# Patient Record
Sex: Female | Born: 1945
Health system: Southern US, Community
[De-identification: ages and names within clinical notes are randomized; demographics above are authoritative.]

## PROBLEM LIST (undated history)

## (undated) DIAGNOSIS — I890 Lymphedema, not elsewhere classified: Secondary | ICD-10-CM

## (undated) DIAGNOSIS — J449 Chronic obstructive pulmonary disease, unspecified: Secondary | ICD-10-CM

## (undated) DIAGNOSIS — E785 Hyperlipidemia, unspecified: Secondary | ICD-10-CM

## (undated) DIAGNOSIS — K219 Gastro-esophageal reflux disease without esophagitis: Secondary | ICD-10-CM

## (undated) DIAGNOSIS — F32A Depression, unspecified: Secondary | ICD-10-CM

## (undated) DIAGNOSIS — R112 Nausea with vomiting, unspecified: Secondary | ICD-10-CM

## (undated) DIAGNOSIS — I480 Paroxysmal atrial fibrillation: Secondary | ICD-10-CM

## (undated) DIAGNOSIS — J4489 Other specified chronic obstructive pulmonary disease: Secondary | ICD-10-CM

## (undated) DIAGNOSIS — O039 Complete or unspecified spontaneous abortion without complication: Secondary | ICD-10-CM

## (undated) DIAGNOSIS — M1990 Unspecified osteoarthritis, unspecified site: Secondary | ICD-10-CM

## (undated) DIAGNOSIS — F419 Anxiety disorder, unspecified: Secondary | ICD-10-CM

## (undated) DIAGNOSIS — I5189 Other ill-defined heart diseases: Secondary | ICD-10-CM

## (undated) DIAGNOSIS — F329 Major depressive disorder, single episode, unspecified: Secondary | ICD-10-CM

## (undated) DIAGNOSIS — Z86010 Personal history of colonic polyps: Secondary | ICD-10-CM

## (undated) DIAGNOSIS — J4540 Moderate persistent asthma, uncomplicated: Secondary | ICD-10-CM

## (undated) DIAGNOSIS — I10 Essential (primary) hypertension: Secondary | ICD-10-CM

## (undated) DIAGNOSIS — Z780 Asymptomatic menopausal state: Secondary | ICD-10-CM

## (undated) DIAGNOSIS — G43909 Migraine, unspecified, not intractable, without status migrainosus: Secondary | ICD-10-CM

## (undated) DIAGNOSIS — H269 Unspecified cataract: Secondary | ICD-10-CM

## (undated) DIAGNOSIS — T7840XA Allergy, unspecified, initial encounter: Secondary | ICD-10-CM

## (undated) DIAGNOSIS — Z9889 Other specified postprocedural states: Secondary | ICD-10-CM

## (undated) HISTORY — DX: Other specified chronic obstructive pulmonary disease: J44.89

## (undated) HISTORY — PX: CATARACT EXTRACTION, BILATERAL: SHX1313

## (undated) HISTORY — DX: Paroxysmal atrial fibrillation: I48.0

## (undated) HISTORY — DX: Complete or unspecified spontaneous abortion without complication: O03.9

## (undated) HISTORY — DX: Hyperlipidemia, unspecified: E78.5

## (undated) HISTORY — PX: POLYPECTOMY: SHX149

## (undated) HISTORY — DX: Unspecified cataract: H26.9

## (undated) HISTORY — DX: Essential (primary) hypertension: I10

## (undated) HISTORY — DX: Other specified postprocedural states: Z98.890

## (undated) HISTORY — DX: Depression, unspecified: F32.A

## (undated) HISTORY — DX: Migraine, unspecified, not intractable, without status migrainosus: G43.909

## (undated) HISTORY — DX: Other ill-defined heart diseases: I51.89

## (undated) HISTORY — DX: Gastro-esophageal reflux disease without esophagitis: K21.9

## (undated) HISTORY — DX: Anxiety disorder, unspecified: F41.9

## (undated) HISTORY — DX: Lymphedema, not elsewhere classified: I89.0

## (undated) HISTORY — DX: Moderate persistent asthma, uncomplicated: J45.40

## (undated) HISTORY — DX: Major depressive disorder, single episode, unspecified: F32.9

## (undated) HISTORY — PX: COLONOSCOPY: SHX174

## (undated) HISTORY — DX: Allergy, unspecified, initial encounter: T78.40XA

## (undated) HISTORY — DX: Nausea with vomiting, unspecified: R11.2

## (undated) HISTORY — DX: Chronic obstructive pulmonary disease, unspecified: J44.9

## (undated) HISTORY — PX: EYE SURGERY: SHX253

## (undated) HISTORY — DX: Unspecified osteoarthritis, unspecified site: M19.90

## (undated) HISTORY — DX: Asymptomatic menopausal state: Z78.0

## (undated) HISTORY — DX: Personal history of colonic polyps: Z86.010

---

## 1978-05-13 HISTORY — PX: TUBAL LIGATION: SHX77

## 2002-07-19 ENCOUNTER — Other Ambulatory Visit: Admission: RE | Admit: 2002-07-19 | Discharge: 2002-07-19 | Payer: Self-pay | Admitting: Family Medicine

## 2004-04-12 ENCOUNTER — Encounter: Payer: Self-pay | Admitting: Family Medicine

## 2004-08-29 ENCOUNTER — Ambulatory Visit: Payer: Self-pay | Admitting: Family Medicine

## 2004-09-06 ENCOUNTER — Ambulatory Visit: Payer: Self-pay | Admitting: Family Medicine

## 2004-09-17 ENCOUNTER — Ambulatory Visit: Payer: Self-pay | Admitting: Internal Medicine

## 2004-10-01 ENCOUNTER — Encounter (INDEPENDENT_AMBULATORY_CARE_PROVIDER_SITE_OTHER): Payer: Self-pay | Admitting: Specialist

## 2004-10-01 ENCOUNTER — Ambulatory Visit: Payer: Self-pay | Admitting: Internal Medicine

## 2004-10-11 ENCOUNTER — Ambulatory Visit: Payer: Self-pay | Admitting: Family Medicine

## 2005-04-23 ENCOUNTER — Ambulatory Visit: Payer: Self-pay | Admitting: Family Medicine

## 2005-04-24 ENCOUNTER — Ambulatory Visit: Payer: Self-pay | Admitting: Family Medicine

## 2005-05-13 LAB — HM COLONOSCOPY: HM Colonoscopy: NORMAL

## 2005-08-13 LAB — HM COLONOSCOPY

## 2005-10-30 ENCOUNTER — Ambulatory Visit: Payer: Self-pay | Admitting: Family Medicine

## 2006-04-23 ENCOUNTER — Ambulatory Visit: Payer: Self-pay | Admitting: Family Medicine

## 2006-04-30 ENCOUNTER — Ambulatory Visit: Payer: Self-pay | Admitting: Family Medicine

## 2007-03-10 ENCOUNTER — Telehealth: Payer: Self-pay | Admitting: Family Medicine

## 2007-05-14 LAB — HM DEXA SCAN

## 2007-05-14 LAB — HM MAMMOGRAPHY

## 2007-06-22 ENCOUNTER — Telehealth: Payer: Self-pay | Admitting: Family Medicine

## 2007-07-08 ENCOUNTER — Encounter (INDEPENDENT_AMBULATORY_CARE_PROVIDER_SITE_OTHER): Payer: Self-pay | Admitting: *Deleted

## 2007-08-18 ENCOUNTER — Encounter: Payer: Self-pay | Admitting: Family Medicine

## 2007-08-18 DIAGNOSIS — K589 Irritable bowel syndrome without diarrhea: Secondary | ICD-10-CM

## 2007-08-18 DIAGNOSIS — K219 Gastro-esophageal reflux disease without esophagitis: Secondary | ICD-10-CM

## 2007-08-18 DIAGNOSIS — I10 Essential (primary) hypertension: Secondary | ICD-10-CM | POA: Insufficient documentation

## 2007-08-18 DIAGNOSIS — J45909 Unspecified asthma, uncomplicated: Secondary | ICD-10-CM

## 2007-08-18 DIAGNOSIS — J309 Allergic rhinitis, unspecified: Secondary | ICD-10-CM | POA: Insufficient documentation

## 2007-08-18 DIAGNOSIS — Z87898 Personal history of other specified conditions: Secondary | ICD-10-CM | POA: Insufficient documentation

## 2007-08-18 DIAGNOSIS — L408 Other psoriasis: Secondary | ICD-10-CM

## 2007-08-18 DIAGNOSIS — M1990 Unspecified osteoarthritis, unspecified site: Secondary | ICD-10-CM

## 2007-08-18 DIAGNOSIS — E785 Hyperlipidemia, unspecified: Secondary | ICD-10-CM

## 2007-08-18 DIAGNOSIS — E1169 Type 2 diabetes mellitus with other specified complication: Secondary | ICD-10-CM | POA: Insufficient documentation

## 2007-08-19 ENCOUNTER — Ambulatory Visit: Payer: Self-pay | Admitting: Family Medicine

## 2007-08-19 DIAGNOSIS — N951 Menopausal and female climacteric states: Secondary | ICD-10-CM | POA: Insufficient documentation

## 2007-08-19 DIAGNOSIS — E559 Vitamin D deficiency, unspecified: Secondary | ICD-10-CM

## 2007-08-25 LAB — CONVERTED CEMR LAB
AST: 20 units/L (ref 0–37)
Albumin: 3.8 g/dL (ref 3.5–5.2)
Alkaline Phosphatase: 88 units/L (ref 39–117)
BUN: 13 mg/dL (ref 6–23)
Bilirubin, Direct: 0.1 mg/dL (ref 0.0–0.3)
Chloride: 106 meq/L (ref 96–112)
Eosinophils Absolute: 0.4 10*3/uL (ref 0.0–0.7)
Eosinophils Relative: 5.9 % — ABNORMAL HIGH (ref 0.0–5.0)
GFR calc non Af Amer: 54 mL/min
HDL: 37.1 mg/dL — ABNORMAL LOW (ref >39.0)
Monocytes Relative: 7.6 % (ref 3.0–12.0)
Neutrophils Relative %: 71.1 % (ref 43.0–77.0)
Platelets: 237 10*3/uL (ref 150–400)
Potassium: 4.2 meq/L (ref 3.5–5.1)
Total CHOL/HDL Ratio: 4.4
VLDL: 36 mg/dL (ref 0–40)
WBC: 7.2 10*3/uL (ref 4.5–10.5)

## 2007-09-16 ENCOUNTER — Encounter: Payer: Self-pay | Admitting: Internal Medicine

## 2007-09-16 ENCOUNTER — Ambulatory Visit: Payer: Self-pay | Admitting: Internal Medicine

## 2007-10-14 ENCOUNTER — Encounter: Payer: Self-pay | Admitting: Family Medicine

## 2007-10-19 ENCOUNTER — Encounter (INDEPENDENT_AMBULATORY_CARE_PROVIDER_SITE_OTHER): Payer: Self-pay | Admitting: *Deleted

## 2007-10-26 ENCOUNTER — Encounter (INDEPENDENT_AMBULATORY_CARE_PROVIDER_SITE_OTHER): Payer: Self-pay | Admitting: *Deleted

## 2008-01-04 ENCOUNTER — Telehealth (INDEPENDENT_AMBULATORY_CARE_PROVIDER_SITE_OTHER): Payer: Self-pay | Admitting: *Deleted

## 2008-01-19 ENCOUNTER — Telehealth (INDEPENDENT_AMBULATORY_CARE_PROVIDER_SITE_OTHER): Payer: Self-pay | Admitting: *Deleted

## 2008-07-28 ENCOUNTER — Telehealth: Payer: Self-pay | Admitting: Family Medicine

## 2009-03-29 ENCOUNTER — Ambulatory Visit: Payer: Self-pay | Admitting: Internal Medicine

## 2009-10-04 ENCOUNTER — Encounter (INDEPENDENT_AMBULATORY_CARE_PROVIDER_SITE_OTHER): Payer: Self-pay | Admitting: *Deleted

## 2010-04-12 LAB — BASIC METABOLIC PANEL
BUN: 15 mg/dL (ref 4–21)
Sodium: 141 mmol/L (ref 137–147)

## 2010-04-12 LAB — HEPATIC FUNCTION PANEL: AST: 21 U/L (ref 13–35)

## 2010-04-12 LAB — LIPID PANEL
Cholesterol: 169 mg/dL (ref 0–200)
HDL: 44 mg/dL (ref 35–70)
LDL Cholesterol: 93 mg/dL
Triglycerides: 158 mg/dL (ref 40–160)

## 2010-04-18 ENCOUNTER — Encounter: Payer: Self-pay | Admitting: Gastroenterology

## 2010-05-11 ENCOUNTER — Encounter (INDEPENDENT_AMBULATORY_CARE_PROVIDER_SITE_OTHER): Payer: Self-pay | Admitting: *Deleted

## 2010-05-15 LAB — HM COLONOSCOPY: HM Colonoscopy: 5

## 2010-05-16 ENCOUNTER — Ambulatory Visit
Admission: RE | Admit: 2010-05-16 | Discharge: 2010-05-16 | Payer: Self-pay | Source: Home / Self Care | Attending: Internal Medicine | Admitting: Internal Medicine

## 2010-05-30 ENCOUNTER — Ambulatory Visit
Admission: RE | Admit: 2010-05-30 | Discharge: 2010-05-30 | Payer: Self-pay | Source: Home / Self Care | Attending: Internal Medicine | Admitting: Internal Medicine

## 2010-05-30 ENCOUNTER — Other Ambulatory Visit: Payer: Self-pay | Admitting: Internal Medicine

## 2010-05-30 DIAGNOSIS — Z86010 Personal history of colonic polyps: Secondary | ICD-10-CM

## 2010-05-30 DIAGNOSIS — Z860101 Personal history of adenomatous and serrated colon polyps: Secondary | ICD-10-CM

## 2010-05-30 HISTORY — DX: Personal history of adenomatous and serrated colon polyps: Z86.0101

## 2010-05-30 HISTORY — DX: Personal history of colonic polyps: Z86.010

## 2010-06-05 ENCOUNTER — Encounter: Payer: Self-pay | Admitting: Internal Medicine

## 2010-06-12 NOTE — Letter (Signed)
Summary: Pre Visit Letter Revised  [facility]  [address] [place], [place] [address]   Phone: [phone]  Fax: [phone]        04/18/2010 MRN: [id number]  Kari [name] [address] [place], [place]  [address]             Procedure Date:  05-30-2010 1:30pm           Dr Leone Payor - Recall Colon   Welcome to the Gastroenterology Division at [facility].    You are scheduled to see a nurse for your pre-procedure visit on 05-16-2010 at 4pm on the 3rd floor at [facility], [address]  We ask that you try to arrive at our office 15 minutes prior to your appointment time to allow for check-in.  Please take a minute to review the attached form.  If you answer "Yes" to one or more of the questions on the first page, we ask that you call the person listed at your earliest opportunity.  If you answer "No" to all of the questions, please complete the rest of the form and bring it to your appointment.    Your nurse visit will consist of discussing your medical and surgical history, your immediate family medical history, and your medications.   If you are unable to list all of your medications on the form, please bring the medication bottles to your appointment and we will list them.  We will need to be aware of both prescribed and over the counter drugs.  We will need to know exact dosage information as well.    Please be prepared to read and sign documents such as consent forms, a financial agreement, and acknowledgement forms.  If necessary, and with your consent, a friend or relative is welcome to sit-in on the nurse visit with you.  Please bring your insurance card so that we may make a copy of it.  If your insurance requires a referral to see a specialist, please bring your referral form from your primary care physician.  No co-pay is required for this nurse visit.     If you cannot keep your appointment, please call [phone] to cancel or  reschedule prior to your appointment date.  This allows [place] the opportunity to schedule an appointment for another patient in need of care.    Thank you for choosing [facility] for your medical needs.  We appreciate the opportunity to care for you.  Please visit [place] at our website  to learn more about our practice.  Sincerely, The Gastroenterology Division

## 2010-06-12 NOTE — Letter (Signed)
Summary: Colonoscopy Letter  [facility]  [address] [place], [place] [address]   Phone: [phone]  Fax: [phone]      Oct 04, 2009 MRN: [id number]   [facility] [address] [place], [place]  [address]   Dear Ms. Dantes,   According to your medical record, it is time for you to schedule a Colonoscopy. The American Cancer Society recommends this procedure as a method to detect early colon cancer. Patients with a family history of colon cancer, or a personal history of colon polyps or inflammatory bowel disease are at increased risk.  This letter has beeen generated based on the recommendations made at the time of your procedure. If you feel that in your particular situation this may no longer apply, please contact our office.  Please call our office at [phone] to schedule this appointment or to update your records at your earliest convenience.  Thank you for cooperating with [place] to provide you with the very best care possible.   Sincerely,  Iva Boop, M.D.  [facility] Gastroenterology Division [phone]

## 2010-06-14 NOTE — Miscellaneous (Signed)
Summary: [facility] Pervisit/prep  Clinical Lists Changes  Medications: Added new medication of MOVIPREP 100 GM  SOLR (PEG-KCL-NACL-NASULF-NA ASC-C) As per prep instructions. - Signed Rx of MOVIPREP 100 GM  SOLR (PEG-KCL-NACL-NASULF-NA ASC-C) As per prep instructions.;  #1 x 0;  Signed;  Entered by: Wyona Almas RN;  Authorized by: Iva Boop MD, [facility];  Method used: Electronically to [facility] [address] [place], [place], [place]  [address], Ph: [phone], Fax: [phone] Allergies: Changed allergy or adverse reaction from * PROCARDIA XL to * PROCARDIA XL Observations: Added new observation of ALLERGY REV: Done (05/16/2010 15:56)    Prescriptions: MOVIPREP 100 GM  SOLR (PEG-KCL-NACL-NASULF-NA ASC-C) As per prep instructions.  #1 x 0   Entered by:   Wyona Almas RN   Authorized by:   Iva Boop MD, [facility]   Signed by:   Wyona Almas RN on 05/16/2010   Method used:   Electronically to        [facility]       [address]       [place], [place]  [address]       Ph: [phone]       Fax: [phone]   RxID:   [id number]

## 2010-06-14 NOTE — Letter (Signed)
Summary: [facility] Instructions  [facility]  [address] [place], [place] [address]   Phone: [phone]  Fax: [phone]       Kari [name]    Aug 08, 1945    MRN: [id number]        Procedure Day Dorna Bloom:  Wednesday  05-30-10     Arrival Time: 12:30 pm     Procedure Time: 1:30 pm     Location of Procedure:                    _x _  [facility] (4th Floor)  PREPARATION FOR COLONOSCOPY WITH MOVIPREP   Starting 5 days prior to your procedure  05-25-10  do not eat nuts, seeds, popcorn, corn, beans, peas,  salads, or any raw vegetables.  Do not take any fiber supplements (e.g. Metamucil, Citrucel, and Benefiber).  THE DAY BEFORE YOUR PROCEDURE         DATE:  05-29-10  DAY:  Tuesday   1.  Drink clear liquids the entire day-NO SOLID FOOD  2.  Do not drink anything colored red or purple.  Avoid juices with pulp.  No orange juice.  3.  Drink at least 64 oz. (8 glasses) of fluid/clear liquids during the day to prevent dehydration and help the prep work efficiently.  CLEAR LIQUIDS INCLUDE: Water Jello Ice Popsicles Tea (sugar ok, no milk/cream) Powdered fruit flavored drinks Coffee (sugar ok, no milk/cream) Gatorade Juice: apple, white grape, white cranberry  Lemonade Clear bullion, consomm, broth Carbonated beverages (any kind) Strained chicken noodle soup Hard Candy                             4.  In the morning, mix first dose of MoviPrep solution:    Empty 1 Pouch A and 1 Pouch B into the disposable container    Add lukewarm drinking water to the top line of the container. Mix to dissolve    Refrigerate (mixed solution should be used within 24 hrs)  5.  Begin drinking the prep at 5:00 p.m. The MoviPrep container is divided by 4 marks.   Every 15 minutes drink the solution down to the next mark (approximately 8 oz) until the full liter is complete.   6.  Follow completed prep with 16 oz of clear liquid of your choice (Nothing red or  purple).  Continue to drink clear liquids until bedtime.  7.  Before going to bed, mix second dose of MoviPrep solution:    Empty 1 Pouch A and 1 Pouch B into the disposable container    Add lukewarm drinking water to the top line of the container. Mix to dissolve    Refrigerate  THE DAY OF YOUR PROCEDURE      DATE:  05-30-10  DAY:  Wednesday  Beginning at  8:30 a.m. (5 hours before procedure):         1. Every 15 minutes, drink the solution down to the next mark (approx 8 oz) until the full liter is complete.  2. Follow completed prep with 16 oz. of clear liquid of your choice.    3. You may drink clear liquids until  11:30 a.m. (2 HOURS BEFORE PROCEDURE).   MEDICATION INSTRUCTIONS  Unless otherwise instructed, you should take regular prescription medications with a small sip of water   as early as possible the morning of your procedure.  OTHER INSTRUCTIONS  You will need a responsible adult at least 63 years of age to accompany you and drive you home.   This person must remain in the waiting room during your procedure.  Wear loose fitting clothing that is easily removed.  Leave jewelry and other valuables at home.  However, you may wish to bring a book to read or  an iPod/MP3 player to listen to music as you wait for your procedure to start.  Remove all body piercing jewelry and leave at home.  Total time from sign-in until discharge is approximately 2-3 hours.  You should go home directly after your procedure and rest.  You can resume normal activities the  day after your procedure.  The day of your procedure you should not:   Drive   Make legal decisions   Operate machinery   Drink alcohol   Return to work  You will receive specific instructions about eating, activities and medications before you leave.    The above instructions have been reviewed and explained to me by   Wyona Almas RN  May 16, 2010 4:31 PM     I fully understand  and can verbalize these instructions _____________________________ Date _________

## 2010-06-14 NOTE — Letter (Signed)
Summary: Patient Notice- Polyp Results  [facility]  [address] [place], [place] [address]   Phone: [phone]  Fax: [phone]        June 05, 2010 MRN: [id number]    [facility] [address] [place], [place]  [address]    Dear Ms. Deshler,  Some of the polyps removed from your colon were adenomatous. This means that they were pre-cancerous or that  they had the potential to change into cancer over time.   I recommend that you have a repeat colonoscopy in 3 years to determine if you have developed any new polyps over time and screen for colorectal cancer. If you develop any new rectal bleeding, abdominal pain or significant bowel habit changes, please contact us before then.  As you know, your sister has had colon cancer and may have a specific genetic abnormality. If this is true, you could need to have a colonoscopy at a sooner interval. You could also benefit from this knowledge and it could be used to determine if you have the same problem with colon cancer.  As we discussed before your colonoscopy, I recommend you obtain your sister's genetic testing information and diagnosis information and let me review it to better guide your care.  Please call us if you are having persistent problems or have questions about your condition that have not been fully answered at this time.  Sincerely,  Iva Boop MD, [facility]  This letter has been electronically signed by your physician.  Appended Document: Patient Notice- Polyp Results LETTER MAILED

## 2010-06-14 NOTE — Procedures (Addendum)
Summary: Colonoscopy  [name]: Kari [name] [name] Note: All result statuses are Final unless otherwise noted.  Tests: (1) Colonoscopy (COL)   COL Colonoscopy           DONE     [facility]     [address]     [place], [place]  [address]           COLONOSCOPY PROCEDURE REPORT           [name]:  Kari [name], [name]  MR#:  [id number]     BIRTHDATE:  06-24-1945, 64 yrs. old  GENDER:  female     ENDOSCOPIST:  Iva Boop, MD, [facility]           PROCEDURE DATE:  05/30/2010     PROCEDURE:  Colonoscopy with snare polypectomy     ASA CLASS:  Class II     INDICATIONS:  Elevated Risk Screening, family history of colon     cancer sister diagnosed at 75     she may have a genetic syndrome diagnosed, not certain     MEDICATIONS:   Fentanyl 50 mcg, Versed 5 mg IV           DESCRIPTION OF PROCEDURE:   After Kari [name] risks benefits and     alternatives of Kari [name] procedure were thoroughly explained, informed     consent was obtained.  Digital rectal exam was performed and     revealed no abnormalities.   Kari [name] LB 180AL [id number] endoscope was     introduced through Kari [name] anus and advanced to Kari [name] cecum, which was     identified by both Kari [name] appendix and ileocecal valve, without     limitations.  Kari [name] quality of Kari [name] prep was excellent, using     MoviPrep.  Kari [name] instrument was then slowly withdrawn as Kari [name] colon     was fully examined. Insertion: 2:52 minutes Withdrawal: 21:09     minutes     <<PROCEDUREIMAGES>>           FINDINGS:  Five polyps were found. Cecum (6mm), diminutive polyps     in transverse (2), splenic flexure and descending colon. Polyp was     snared, then cauterized with monopolar cautery. Retrieval was     successful. Polyps were snared without cautery. Retrieval was     successful. Mild diverticulosis was found in Kari [name] sigmoid colon.     This was otherwise a normal examination of Kari [name] colon including     right colon retroflexion.  Retroflexed views in Kari [name] rectum     revealed internal  hemorrhoids.    Kari [name] scope was then withdrawn     from Kari [name] [name] and Kari [name] procedure completed.           COMPLICATIONS:  None     ENDOSCOPIC IMPRESSION:     1) Five polyps removed one 6 mm and Kari [name] rest < 5mm.     2) Mild diverticulosis in Kari [name] sigmoid colon     3) Lipoma, descending colon     4) Otherwise normal examination of Kari [name] colon, excellent prep     5) Internal hemorrhoids     RECOMMENDATIONS:     1) Hold aspirin, aspirin products, and anti-inflammatory     medication for 1 week.           Consider deep sedation next exam.           REPEAT EXAM:  In for Colonoscopy, pending biopsy results.  Iva Boop, MD, [facility]           CC:  [name], MD     Kari [name] [name]           n.     Rosalie Doctor:   Iva Boop at 05/30/2010 02:16 PM           Milinda Pointer, [id number]  Note: An exclamation mark (!) indicates a result that was not dispersed into Kari [name] flowsheet. Document Creation Date: 05/30/2010 2:16 PM _______________________________________________________________________  (1) Order result status: Final Collection or observation date-time: 05/30/2010 14:02 Requested date-time:  Receipt date-time:  Reported date-time:  Referring Physician:   Ordering Physician: Stan Head [phone]) Specimen Source:  Source: Launa Grill Order Number: [id number] Lab site:   Appended Document: Colonoscopy she will again ask her sister for genetic testing information so I can better recommend colonoscopy intervals and possibly recommend blood testing for inherited cancer syndromes  Appended Document: Colonoscopy     Procedures Next Due Date:    Colonoscopy: 05/2013

## 2011-01-02 ENCOUNTER — Ambulatory Visit (INDEPENDENT_AMBULATORY_CARE_PROVIDER_SITE_OTHER): Payer: 59 | Admitting: Internal Medicine

## 2011-01-02 ENCOUNTER — Encounter: Payer: Self-pay | Admitting: Internal Medicine

## 2011-01-02 DIAGNOSIS — E785 Hyperlipidemia, unspecified: Secondary | ICD-10-CM

## 2011-01-02 DIAGNOSIS — E559 Vitamin D deficiency, unspecified: Secondary | ICD-10-CM

## 2011-01-02 DIAGNOSIS — K589 Irritable bowel syndrome without diarrhea: Secondary | ICD-10-CM

## 2011-01-02 DIAGNOSIS — Z1239 Encounter for other screening for malignant neoplasm of breast: Secondary | ICD-10-CM

## 2011-01-02 DIAGNOSIS — H9190 Unspecified hearing loss, unspecified ear: Secondary | ICD-10-CM | POA: Insufficient documentation

## 2011-01-02 DIAGNOSIS — I10 Essential (primary) hypertension: Secondary | ICD-10-CM

## 2011-01-02 DIAGNOSIS — E569 Vitamin deficiency, unspecified: Secondary | ICD-10-CM

## 2011-01-02 DIAGNOSIS — R5383 Other fatigue: Secondary | ICD-10-CM

## 2011-01-02 DIAGNOSIS — Z79899 Other long term (current) drug therapy: Secondary | ICD-10-CM

## 2011-01-02 DIAGNOSIS — G4700 Insomnia, unspecified: Secondary | ICD-10-CM

## 2011-01-02 MED ORDER — MONTELUKAST SODIUM 10 MG PO TABS: 10.0000 mg | ORAL_TABLET | Freq: Every day | ORAL | Status: DC

## 2011-01-02 MED ORDER — ALPRAZOLAM 0.5 MG PO TABS: 0.5000 mg | ORAL_TABLET | Freq: Every evening | ORAL | Status: DC | PRN

## 2011-01-02 MED ORDER — MOMETASONE FUROATE 50 MCG/ACT NA SUSP: 2.0000 | Freq: Every day | NASAL | Status: DC

## 2011-01-02 MED ORDER — DIFLORASONE DIACETATE 0.05 % EX OINT: TOPICAL_OINTMENT | Freq: Every day | CUTANEOUS | Status: DC

## 2011-01-02 MED ORDER — ATORVASTATIN CALCIUM 10 MG PO TABS: 10.0000 mg | ORAL_TABLET | Freq: Every day | ORAL | Status: DC

## 2011-01-02 MED ORDER — FLUOCINOLONE ACETONIDE 0.01 % EX CREA: TOPICAL_CREAM | Freq: Two times a day (BID) | CUTANEOUS | Status: DC

## 2011-01-02 MED ORDER — DIFLORASONE DIACET EMOLL BASE 0.05 % EX CREA: TOPICAL_CREAM | CUTANEOUS | Status: DC

## 2011-01-02 MED ORDER — RAMIPRIL 10 MG PO TABS: 10.0000 mg | ORAL_TABLET | Freq: Every day | ORAL | Status: DC

## 2011-01-02 MED ORDER — BUPROPION HCL ER (SR) 200 MG PO TB12: 200.0000 mg | ORAL_TABLET | Freq: Two times a day (BID) | ORAL | Status: DC

## 2011-01-02 NOTE — Assessment & Plan Note (Signed)
Referral to ENT for hearing loss

## 2011-01-02 NOTE — Patient Instructions (Addendum)
You may use the alprazolam as needed for early morning wakenings due to insomni.  Try cutting it in half first. You can try a daily fiber supplement to keep your stools from irritating your internal hemorrhoids.

## 2011-01-02 NOTE — Assessment & Plan Note (Signed)
Symptoms are quiescent currently.

## 2011-01-02 NOTE — Assessment & Plan Note (Signed)
She is overdue for lipids and CMET.

## 2011-01-02 NOTE — Assessment & Plan Note (Signed)
Currently well controlled. No changes today 

## 2011-01-02 NOTE — Progress Notes (Signed)
  Subjective:    Patient ID: Kari [name], female    DOB: 08-29-1945, 65 y.o.   MRN: [id number]  HPI Patient returns for followup.  Last visit 8 months ago.  Has been having several episodes of insomnia which occur at 3:00 am.  She has taken alprazolam , low dose for relief.  Seh has also noted some hearing loss over the last 3 months, for high sounds and is having trouble understanding prople  With [employer].    Review of Systems  Constitutional: Negative for fever, chills and unexpected weight change.  HENT: Negative for hearing loss, ear pain, nosebleeds, congestion, sore throat, facial swelling, rhinorrhea, sneezing, mouth sores, trouble swallowing, neck pain, neck stiffness, voice change, postnasal drip, sinus pressure, tinnitus and ear discharge.   Eyes: Negative for pain, discharge, redness and visual disturbance.  Respiratory: Negative for cough, chest tightness, shortness of breath, wheezing and stridor.   Cardiovascular: Negative for chest pain, palpitations and leg swelling.  Gastrointestinal: Positive for diarrhea and constipation.  Genitourinary: Negative for vaginal discharge.  Musculoskeletal: Negative for myalgias and arthralgias.  Skin: Negative for color change and rash.  Neurological: Negative for dizziness, weakness, light-headedness and headaches.  Hematological: Negative for adenopathy.  Psychiatric/Behavioral: Positive for sleep disturbance.       Objective:   Physical Exam  Constitutional: She is oriented to person, place, and time. She appears well-developed and well-nourished.  HENT:  Head: Normocephalic and atraumatic.  Eyes: EOM are normal. Pupils are equal, round, and reactive to light.  Neck: Normal range of motion. Neck supple.  Cardiovascular: Normal rate, regular rhythm and normal heart sounds.   Pulmonary/Chest: Effort normal and breath sounds normal.  Abdominal: Soft. Bowel sounds are normal.  Musculoskeletal: Normal range of motion. She  exhibits no edema.  Neurological: She is alert and oriented to person, place, and time.  Skin: Skin is warm and dry.  Psychiatric: She has a normal mood and affect.          Assessment & Plan:

## 2011-01-03 LAB — COMPREHENSIVE METABOLIC PANEL
ALT: 22 U/L (ref 0–35)
BUN: 16 mg/dL (ref 6–23)
CO2: 27 mEq/L (ref 19–32)
Calcium: 9.4 mg/dL (ref 8.4–10.5)
Creatinine, Ser: 1.1 mg/dL (ref 0.4–1.2)
GFR: 52.16 mL/min — ABNORMAL LOW (ref >60.00)
Total Bilirubin: 0.9 mg/dL (ref 0.3–1.2)

## 2011-01-03 LAB — VITAMIN D 25 HYDROXY (VIT D DEFICIENCY, FRACTURES): Vit D, 25-Hydroxy: 41 ng/mL (ref 30–89)

## 2011-01-03 LAB — LIPID PANEL
Total CHOL/HDL Ratio: 4
Triglycerides: 244 mg/dL — ABNORMAL HIGH (ref 0.0–149.0)
VLDL: 48.8 mg/dL — ABNORMAL HIGH (ref 0.0–40.0)

## 2011-01-04 ENCOUNTER — Encounter: Payer: Self-pay | Admitting: Internal Medicine

## 2011-01-30 ENCOUNTER — Other Ambulatory Visit: Payer: Self-pay | Admitting: Internal Medicine

## 2011-02-01 MED ORDER — FLUTICASONE PROPIONATE 50 MCG/ACT NA SUSP: 2.0000 | Freq: Every day | NASAL | Status: DC

## 2011-04-03 ENCOUNTER — Other Ambulatory Visit: Payer: Self-pay | Admitting: Internal Medicine

## 2011-04-03 MED ORDER — BUPROPION HCL ER (SR) 200 MG PO TB12: 200.0000 mg | ORAL_TABLET | Freq: Two times a day (BID) | ORAL | Status: DC

## 2011-06-10 ENCOUNTER — Other Ambulatory Visit: Payer: Self-pay | Admitting: *Deleted

## 2011-06-10 MED ORDER — ATORVASTATIN CALCIUM 10 MG PO TABS: 10.0000 mg | ORAL_TABLET | Freq: Every day | ORAL | Status: DC

## 2011-06-10 NOTE — Telephone Encounter (Signed)
[facility]axed request from [facility], last filled 05/03/11.

## 2011-07-03 ENCOUNTER — Other Ambulatory Visit: Payer: 59

## 2011-07-04 ENCOUNTER — Other Ambulatory Visit: Payer: Self-pay | Admitting: *Deleted

## 2011-07-04 MED ORDER — RAMIPRIL 10 MG PO TABS: 10.0000 mg | ORAL_TABLET | Freq: Every day | ORAL | Status: DC

## 2011-07-10 ENCOUNTER — Encounter: Payer: 59 | Admitting: Internal Medicine

## 2011-07-16 ENCOUNTER — Telehealth: Payer: Self-pay | Admitting: *Deleted

## 2011-07-16 ENCOUNTER — Other Ambulatory Visit (INDEPENDENT_AMBULATORY_CARE_PROVIDER_SITE_OTHER): Payer: 59 | Admitting: *Deleted

## 2011-07-16 DIAGNOSIS — R5381 Other malaise: Secondary | ICD-10-CM

## 2011-07-16 DIAGNOSIS — E559 Vitamin D deficiency, unspecified: Secondary | ICD-10-CM

## 2011-07-16 LAB — COMPREHENSIVE METABOLIC PANEL
ALT: 20 U/L (ref 0–35)
AST: 22 U/L (ref 0–37)
Albumin: 3.8 g/dL (ref 3.5–5.2)
CO2: 24 mEq/L (ref 19–32)
Calcium: 8.7 mg/dL (ref 8.4–10.5)
Chloride: 104 mEq/L (ref 96–112)
Creatinine, Ser: 1.2 mg/dL (ref 0.4–1.2)
GFR: 50.51 mL/min — ABNORMAL LOW (ref >60.00)
Potassium: 3.9 mEq/L (ref 3.5–5.1)

## 2011-07-16 LAB — CBC WITH DIFFERENTIAL/PLATELET
Basophils Absolute: 0 10*3/uL (ref 0.0–0.1)
Eosinophils Absolute: 0.3 10*3/uL (ref 0.0–0.7)
Lymphocytes Relative: 17.3 % (ref 12.0–46.0)
MCHC: 33.9 g/dL (ref 30.0–36.0)
MCV: 86.7 fl (ref 78.0–100.0)
Monocytes Absolute: 0.6 10*3/uL (ref 0.1–1.0)
Neutrophils Relative %: 70 % (ref 43.0–77.0)
Platelets: 209 10*3/uL (ref 150.0–400.0)
RBC: 4.12 Mil/uL (ref 3.87–5.11)
RDW: 14.5 % (ref 11.5–14.6)

## 2011-07-16 LAB — LIPID PANEL
Cholesterol: 156 mg/dL (ref 0–200)
HDL: 43.7 mg/dL (ref >39.00)
Triglycerides: 166 mg/dL — ABNORMAL HIGH (ref 0.0–149.0)
VLDL: 33.2 mg/dL (ref 0.0–40.0)

## 2011-07-16 NOTE — Telephone Encounter (Signed)
Labs ordered.

## 2011-07-16 NOTE — Telephone Encounter (Signed)
Patient came in this morning for labs. She has a cpx on 07-24-11. What labs would you like for me to order?

## 2011-07-17 LAB — VITAMIN D 25 HYDROXY (VIT D DEFICIENCY, FRACTURES): Vit D, 25-Hydroxy: 49 ng/mL (ref 30–89)

## 2011-07-24 ENCOUNTER — Ambulatory Visit (INDEPENDENT_AMBULATORY_CARE_PROVIDER_SITE_OTHER): Payer: 59 | Admitting: Internal Medicine

## 2011-07-24 ENCOUNTER — Encounter: Payer: Self-pay | Admitting: Internal Medicine

## 2011-07-24 VITALS — BP 138/70 | HR 98 | Temp 98.1°F | Resp 16 | Ht 64.0 in | Wt 229.2 lb

## 2011-07-24 DIAGNOSIS — Z1239 Encounter for other screening for malignant neoplasm of breast: Secondary | ICD-10-CM

## 2011-07-24 DIAGNOSIS — M1990 Unspecified osteoarthritis, unspecified site: Secondary | ICD-10-CM

## 2011-07-24 DIAGNOSIS — E559 Vitamin D deficiency, unspecified: Secondary | ICD-10-CM

## 2011-07-24 DIAGNOSIS — I10 Essential (primary) hypertension: Secondary | ICD-10-CM

## 2011-07-24 DIAGNOSIS — E785 Hyperlipidemia, unspecified: Secondary | ICD-10-CM

## 2011-07-24 DIAGNOSIS — L408 Other psoriasis: Secondary | ICD-10-CM

## 2011-07-24 MED ORDER — TRIAMCINOLONE ACETONIDE 0.1 % EX LOTN: TOPICAL_LOTION | Freq: Three times a day (TID) | CUTANEOUS | Status: DC

## 2011-07-24 NOTE — Patient Instructions (Signed)
.  Consider the Low Glycemic Index Diet and 6 smaller meals daily :   7 AM Low carbohydrate Protein  Shakes (EAS Carb Control  Or Atkins ,  Available everywhere,   In  cases at [employer] )  2.5 carbs  (Add or substitute a toasted sandwhich thin w/ peanut butter)  10 AM: Protein bar by Atkins (snack size,  Chocolate lover's variety at  [employer])    Lunch: sandwich on pita bread or flatbread (Joseph's makes a pita bread and a flat bread , available at [employer] and [employer]; Toufayah makes a low carb flatbread available at [employer] and [employer])   3 PM:  Mid day :  Another protein bar,  Or a  cheese stick, 1/4 cup of almonds, walnuts, pistachios, pecans, peanuts,  Macadamia nuts  6 PM  Dinner:  "mean and green:"  Meat/chicken/fish, salad, and green veggie : use ranch, vinagrette,  Blue cheese, etc  9 PM snack : Breyer's low carb fudgiscle or  ice cream bar (Carb Smart) Weight Watcher's ice cream bar , or another protein shake 

## 2011-07-24 NOTE — Assessment & Plan Note (Addendum)
Refill of triamcinolone 0.1%  Lotion has helped. Refills given,

## 2011-07-25 ENCOUNTER — Encounter: Payer: Self-pay | Admitting: Internal Medicine

## 2011-07-25 DIAGNOSIS — R002 Palpitations: Secondary | ICD-10-CM

## 2011-07-25 DIAGNOSIS — I10 Essential (primary) hypertension: Secondary | ICD-10-CM | POA: Insufficient documentation

## 2011-07-25 DIAGNOSIS — E785 Hyperlipidemia, unspecified: Secondary | ICD-10-CM | POA: Insufficient documentation

## 2011-07-25 DIAGNOSIS — G43909 Migraine, unspecified, not intractable, without status migrainosus: Secondary | ICD-10-CM | POA: Insufficient documentation

## 2011-07-25 DIAGNOSIS — O039 Complete or unspecified spontaneous abortion without complication: Secondary | ICD-10-CM | POA: Insufficient documentation

## 2011-07-25 DIAGNOSIS — Z780 Asymptomatic menopausal state: Secondary | ICD-10-CM | POA: Insufficient documentation

## 2011-07-28 ENCOUNTER — Encounter: Payer: Self-pay | Admitting: Internal Medicine

## 2011-07-28 NOTE — Assessment & Plan Note (Signed)
Repeat level is due given her history of osteoporosis

## 2011-07-28 NOTE — Assessment & Plan Note (Signed)
Well controlled on current medications.  No changes today. 

## 2011-07-28 NOTE — Assessment & Plan Note (Signed)
Well controlled on current regimen, with normal lfts. No changes today

## 2011-07-28 NOTE — Assessment & Plan Note (Signed)
Chronic, managed with prn NSAIDs.

## 2011-07-28 NOTE — Progress Notes (Signed)
Patient ID: Kari [name], female   DOB: [birth year], 66 y.o.   MRN: [id number]  Patient Active Problem List  Diagnoses  . UNSPECIFIED VITAMIN D DEFICIENCY  . HYPERLIPIDEMIA  . HYPERTENSION  . ALLERGIC RHINITIS  . ASTHMA  . GERD  . IBS  . POSTMENOPAUSAL STATUS  . PSORIASIS  . OSTEOARTHRITIS  . COLONIC POLYPS, BENIGN, HX OF  . MIGRAINES, HX OF  . Screening for malignant neoplasm of breast  . Hearing loss  . Hyperlipidemia  . Menopause  . Hypertension  . Migraines  . Asthma  . Miscarriage  . Palpitations    Subjective:  CC:   Chief Complaint  Patient presents with  . Follow-up    HPI:   Kari [name] a 67 y.o. female who presents for 6 month followup on chronic conditions.   Her cc; is joint pain, aggravated by inactivity,  improved with use.  Takes prn NSAIDs.  No joint effusions, erythema or or joints giving way.  No GI upset,  Change in bowel habits, insomnia. No recent asthma exacerbations.   Past Medical History  Diagnosis Date  . Hyperlipidemia   . Menopause   . Hypertension   . Migraines   . Asthma   . Miscarriage     X 1  . Allergy     to cat, dogs, pollen, dust, cigarette smoke    Past Surgical History  Procedure Date  . Tubal ligation 1980  . Cesarean section     X 2         The following portions of the patient's history were reviewed and updated as appropriate: Allergies, current medications, and problem list.    Review of Systems:   12 Pt  review of systems was negative except those addressed in the HPI,     History   Social History  . Marital Status: Married    Spouse Name: N/A    Number of Children: N/A  . Years of Education: N/A   Occupational History  . Not on file.   Social History Main Topics  . Smoking status: Former Smoker    Quit date: 01/02/1971  . Smokeless tobacco: Never Used  . Alcohol Use: Yes     occasional- wine gives pt headache  . Drug Use: No  . Sexually Active: Not on file   Other Topics  Concern  . Not on file   Social History Narrative  . No narrative on file    Objective:  BP 138/70  Pulse 98  Temp(Src) 98.1 F (36.7 C) (Oral)  Resp 16  Ht 5\' 4"  (1.626 m)  Wt 229 lb 4 oz (103.987 kg)  BMI 39.35 kg/m2  SpO2 96%  General appearance: alert, cooperative and appears stated age Ears: normal TM's and external ear canals both ears Throat: lips, mucosa, and tongue normal; teeth and gums normal Neck: no adenopathy, no carotid bruit, supple, symmetrical, trachea midline and thyroid not enlarged, symmetric, no tenderness/mass/nodules Back: symmetric, no curvature. ROM normal. No CVA tenderness. Lungs: clear to auscultation bilaterally Heart: regular rate and rhythm, S1, S2 normal, no murmur, click, rub or gallop Abdomen: soft, non-tender; bowel sounds normal; no masses,  no organomegaly Pulses: 2+ and symmetric Skin: Skin color, texture, turgor normal. No rashes or lesions Lymph nodes: Cervical, supraclavicular, and axillary nodes normal.  Assessment and Plan:  PSORIASIS Refill of triamcinolone 0.1%  Lotion has helped. Refills given,   UNSPECIFIED VITAMIN D DEFICIENCY Repeat level is due given her history of osteoporosis  OSTEOARTHRITIS  Chronic, managed with prn NSAIDs.   HYPERTENSION Well controlled on current medications.  No changes today.  Hyperlipidemia Well controlled on current regimen, with normal lfts. No changes today    Updated Medication List Outpatient Encounter Prescriptions as of 07/24/2011  Medication Sig Dispense Refill  . ALPRAZolam (XANAX) 0.5 MG tablet Take 1 tablet (0.5 mg total) by mouth at bedtime as needed for sleep or anxiety.  30 tablet  2  . aspirin 325 MG tablet Take 325 mg by mouth daily.        [facility] atorvastatin (LIPITOR) 10 MG tablet Take 1 tablet (10 mg total) by mouth daily.  30 tablet  6  . buPROPion (WELLBUTRIN SR) 200 MG 12 hr tablet Take 1 tablet (200 mg total) by mouth 2 (two) times daily.  60 tablet  3  . cetirizine  (ZYRTEC) 10 MG tablet Take 10 mg by mouth daily.        . cycloSPORINE (RESTASIS) 0.05 % ophthalmic emulsion 1 drop 2 (two) times daily.        . diflorasone (PSORCON) 0.05 % ointment Apply topically daily.  30 g  3  . fluticasone (FLONASE) 50 MCG/ACT nasal spray Place 2 sprays into the nose daily.  16 g  3  . montelukast (SINGULAIR) 10 MG tablet Take 1 tablet (10 mg total) by mouth at bedtime.  90 tablet  3  . omeprazole (PRILOSEC) 20 MG capsule Take 20 mg by mouth daily.        . ramipril (ALTACE) 10 MG tablet Take 1 tablet (10 mg total) by mouth daily.  90 tablet  3  . triamcinolone lotion (KENALOG) 0.1 % Apply topically 3 (three) times daily.  60 mL  6  . DISCONTD: cetirizine-pseudoephedrine (ZYRTEC-D) 5-120 MG per tablet Take 1 tablet by mouth daily.        Marland Kitchen DISCONTD: diflorasone-emollient (APEXICON E) 0.05 % CREA Apply to area twice a day  30 Tube  3  . DISCONTD: fluocinolone (VANOS) 0.01 % cream Apply topically 2 (two) times daily.  30 g  3  . DISCONTD: mometasone (NASONEX) 50 MCG/ACT nasal spray Place 2 sprays into the nose daily.  17 g  3     Orders Placed This Encounter  Procedures  . MM Digital Screening    No Follow-up on file.

## 2011-07-31 ENCOUNTER — Other Ambulatory Visit: Payer: Self-pay | Admitting: Internal Medicine

## 2011-07-31 MED ORDER — FLUTICASONE PROPIONATE 50 MCG/ACT NA SUSP: 2.0000 | Freq: Every day | NASAL | Status: DC

## 2011-09-02 ENCOUNTER — Other Ambulatory Visit: Payer: Self-pay | Admitting: Internal Medicine

## 2011-09-02 MED ORDER — MONTELUKAST SODIUM 10 MG PO TABS: 10.0000 mg | ORAL_TABLET | Freq: Every day | ORAL | Status: DC

## 2011-09-02 MED ORDER — FLUTICASONE PROPIONATE 50 MCG/ACT NA SUSP: 2.0000 | Freq: Every day | NASAL | Status: DC

## 2011-10-09 ENCOUNTER — Other Ambulatory Visit: Payer: Self-pay | Admitting: Internal Medicine

## 2011-10-09 DIAGNOSIS — G4700 Insomnia, unspecified: Secondary | ICD-10-CM

## 2011-10-10 MED ORDER — ALPRAZOLAM 0.5 MG PO TABS: 0.5000 mg | ORAL_TABLET | Freq: Every evening | ORAL | Status: DC | PRN

## 2011-12-13 ENCOUNTER — Encounter: Payer: Self-pay | Admitting: Internal Medicine

## 2011-12-18 ENCOUNTER — Telehealth: Payer: Self-pay | Admitting: Internal Medicine

## 2011-12-18 NOTE — Telephone Encounter (Signed)
Patient called in wanting to know if we had received fax last week from her pharmacy.  She states that her insurance is requesting something since she only takes name brand of Wellbutrin.  She is now out of medication, I apologized that we hadn't received the fax that they told her they sent a week ago.  She can't take generic, has to be name brand.

## 2011-12-19 NOTE — Telephone Encounter (Signed)
I have received the fax, and sent the form to [employer].  We are now waiting on the insurances approval.

## 2011-12-23 NOTE — Telephone Encounter (Signed)
Patient called back states that she has been without this medication for a week now.  She didn't understand that we had to get PA before the prescription could be sent in.  She wants to know if you will please call [employer] and follow up with them. I explained that you were waiting on fax from them.

## 2011-12-24 ENCOUNTER — Other Ambulatory Visit: Payer: Self-pay | Admitting: Internal Medicine

## 2011-12-24 MED ORDER — BUPROPION HCL ER (SR) 200 MG PO TB12: 200.0000 mg | ORAL_TABLET | Freq: Two times a day (BID) | ORAL | Status: DC

## 2011-12-24 NOTE — Telephone Encounter (Signed)
I called [employer] to check on the status of the Rx.  They stated they never received the fax, so I did the prior authorization over the phone.  It has been approved, patient and pharmacy notified.

## 2012-01-02 LAB — HM MAMMOGRAPHY: HM Mammogram: NORMAL

## 2012-01-10 ENCOUNTER — Encounter: Payer: Self-pay | Admitting: Internal Medicine

## 2012-01-21 ENCOUNTER — Encounter: Payer: Self-pay | Admitting: Internal Medicine

## 2012-02-24 ENCOUNTER — Other Ambulatory Visit: Payer: Self-pay | Admitting: Internal Medicine

## 2012-02-24 NOTE — Telephone Encounter (Signed)
Received refill request electronically from pharmacy. Is it okay to refill medication? 

## 2012-05-16 ENCOUNTER — Other Ambulatory Visit: Payer: Self-pay | Admitting: Internal Medicine

## 2012-06-05 ENCOUNTER — Telehealth: Payer: Self-pay | Admitting: Internal Medicine

## 2012-06-05 DIAGNOSIS — G4700 Insomnia, unspecified: Secondary | ICD-10-CM

## 2012-06-05 MED ORDER — ALPRAZOLAM 0.5 MG PO TABS: 0.5000 mg | ORAL_TABLET | Freq: Every evening | ORAL | Status: AC | PRN

## 2012-06-05 MED ORDER — ALPRAZOLAM 0.5 MG PO TABS: 0.5000 mg | ORAL_TABLET | Freq: Every evening | ORAL | Status: DC | PRN

## 2012-06-05 NOTE — Telephone Encounter (Signed)
Can fill for qty  #30 with no refills until she is seen  . Epic updated.

## 2012-06-05 NOTE — Telephone Encounter (Signed)
Pt last seen on 07/24/11.

## 2012-06-05 NOTE — Telephone Encounter (Signed)
Med phoned in °

## 2012-06-05 NOTE — Telephone Encounter (Signed)
Kari [name] calling from [facility].  States Pt needs refill of Xanax 0.5 mg #30 1 tab at bedtime for sleep/anxiety.  [phone]

## 2012-06-17 ENCOUNTER — Other Ambulatory Visit: Payer: Self-pay | Admitting: Internal Medicine

## 2012-07-01 ENCOUNTER — Telehealth: Payer: Self-pay | Admitting: Internal Medicine

## 2012-07-01 NOTE — Telephone Encounter (Signed)
Patient needs an alternative to Wellbutrin ,because her insurance company will not cover this medication. She will have to pay several hundred dollars difference to get this medication.

## 2012-07-01 NOTE — Telephone Encounter (Signed)
Needing labs put in for her physical on 3.19.14

## 2012-07-01 NOTE — Telephone Encounter (Signed)
FYI. Pt notified to contact insurance to get the formulary list to see what alternative medicine they would cover.

## 2012-07-01 NOTE — Telephone Encounter (Signed)
Patient called with list that would replace Wellbutrin. Trazodone, maprotiline, mirtazapine, nefazodone, brintellix, oleptro

## 2012-07-14 ENCOUNTER — Other Ambulatory Visit: Payer: Self-pay | Admitting: Internal Medicine

## 2012-07-14 MED ORDER — MIRTAZAPINE 15 MG PO TABS: 15.0000 mg | ORAL_TABLET | Freq: Every day | ORAL | Status: DC

## 2012-07-14 NOTE — Telephone Encounter (Signed)
This was forwarded to you on the 19th of last month. Did you see this?

## 2012-07-17 ENCOUNTER — Other Ambulatory Visit: Payer: Self-pay | Admitting: Internal Medicine

## 2012-07-28 ENCOUNTER — Telehealth: Payer: Self-pay | Admitting: *Deleted

## 2012-07-28 NOTE — Addendum Note (Signed)
Addended by: Sherlene Shams on: 07/28/2012 05:34 PM   Modules accepted: Orders

## 2012-07-28 NOTE — Telephone Encounter (Signed)
Pt is coming in tomorrow 03.19.2014 for labs, what labs and dx would you like for this pt? Thank you

## 2012-07-29 ENCOUNTER — Other Ambulatory Visit (INDEPENDENT_AMBULATORY_CARE_PROVIDER_SITE_OTHER): Payer: 59

## 2012-07-29 DIAGNOSIS — Z79899 Other long term (current) drug therapy: Secondary | ICD-10-CM

## 2012-07-29 DIAGNOSIS — R7309 Other abnormal glucose: Secondary | ICD-10-CM

## 2012-07-29 DIAGNOSIS — R5381 Other malaise: Secondary | ICD-10-CM

## 2012-07-29 LAB — COMPREHENSIVE METABOLIC PANEL
ALT: 20 U/L (ref 0–35)
CO2: 26 mEq/L (ref 19–32)
Calcium: 8.8 mg/dL (ref 8.4–10.5)
Chloride: 109 mEq/L (ref 96–112)
Creatinine, Ser: 1.1 mg/dL (ref 0.4–1.2)
GFR: 53 mL/min — ABNORMAL LOW (ref >60.00)
Glucose, Bld: 124 mg/dL — ABNORMAL HIGH (ref 70–99)
Total Protein: 6.2 g/dL (ref 6.0–8.3)

## 2012-07-29 LAB — CBC WITH DIFFERENTIAL/PLATELET
Basophils Absolute: 0 10*3/uL (ref 0.0–0.1)
Basophils Relative: 0.6 % (ref 0.0–3.0)
Eosinophils Relative: 4.7 % (ref 0.0–5.0)
Hemoglobin: 12.1 g/dL (ref 12.0–15.0)
Lymphocytes Relative: 17.9 % (ref 12.0–46.0)
Monocytes Relative: 8.1 % (ref 3.0–12.0)
Neutro Abs: 4.8 10*3/uL (ref 1.4–7.7)
RBC: 4.2 Mil/uL (ref 3.87–5.11)
RDW: 14.7 % — ABNORMAL HIGH (ref 11.5–14.6)
WBC: 7 10*3/uL (ref 4.5–10.5)

## 2012-07-29 LAB — TSH: TSH: 1.62 u[IU]/mL (ref 0.35–5.50)

## 2012-07-29 LAB — LIPID PANEL
Total CHOL/HDL Ratio: 4
Triglycerides: 187 mg/dL — ABNORMAL HIGH (ref 0.0–149.0)

## 2012-07-29 LAB — HEMOGLOBIN A1C: Hgb A1c MFr Bld: 5.3 % (ref 4.6–6.5)

## 2012-07-29 NOTE — Addendum Note (Signed)
Addended by: Sherlene Shams on: 07/29/2012 01:28 PM   Modules accepted: Orders

## 2012-08-05 ENCOUNTER — Other Ambulatory Visit (HOSPITAL_COMMUNITY)
Admission: RE | Admit: 2012-08-05 | Discharge: 2012-08-05 | Disposition: A | Discharge status: Home / Self Care | Payer: 59 | Source: Ambulatory Visit | Attending: Internal Medicine | Admitting: Internal Medicine

## 2012-08-05 ENCOUNTER — Ambulatory Visit (INDEPENDENT_AMBULATORY_CARE_PROVIDER_SITE_OTHER): Payer: 59 | Admitting: Internal Medicine

## 2012-08-05 ENCOUNTER — Encounter: Payer: Self-pay | Admitting: Internal Medicine

## 2012-08-05 VITALS — BP 142/82 | HR 86 | Temp 97.6°F | Resp 16 | Ht 64.0 in | Wt 231.2 lb

## 2012-08-05 DIAGNOSIS — Z01419 Encounter for gynecological examination (general) (routine) without abnormal findings: Secondary | ICD-10-CM | POA: Insufficient documentation

## 2012-08-05 DIAGNOSIS — Z124 Encounter for screening for malignant neoplasm of cervix: Secondary | ICD-10-CM

## 2012-08-05 DIAGNOSIS — D2330 Other benign neoplasm of skin of unspecified part of face: Secondary | ICD-10-CM

## 2012-08-05 DIAGNOSIS — Z1151 Encounter for screening for human papillomavirus (HPV): Secondary | ICD-10-CM | POA: Insufficient documentation

## 2012-08-05 DIAGNOSIS — E785 Hyperlipidemia, unspecified: Secondary | ICD-10-CM

## 2012-08-05 DIAGNOSIS — Z0000 Encounter for general adult medical examination without abnormal findings: Secondary | ICD-10-CM

## 2012-08-05 DIAGNOSIS — D2239 Melanocytic nevi of other parts of face: Secondary | ICD-10-CM

## 2012-08-05 DIAGNOSIS — Z1239 Encounter for other screening for malignant neoplasm of breast: Secondary | ICD-10-CM

## 2012-08-05 NOTE — Patient Instructions (Addendum)
Your triglycerides were a little elevated and your  HDL was a little too low. All other labs were normal. No signs of diabetes.  I would like you to lose   25 lbs,  Or 10%  Of your current body weight over the next 6 months  With diet and exercise   This is  One version of a  "Low GI"  Diet:  It will still lower your triglycerides, raise your HDL and allow you to lose 4 to 8  lbs  per month if you follow it carefully and combine it with 30 minutes of aerobic exercise 5 days per week .   All of the foods can be found at grocery stores and in bulk at [employer].  The Atkins protein bars and shakes are available in more varieties at Target, WalMart and Lowe's Foods.     7 AM Breakfast:  Choose from the following:  Low carbohydrate Protein  Shakes (I recommend the EAS AdvantEdge "Carb Control" shakes  Or the low carb shakes by Atkins.    2.5 carbs   Arnold's "Sandwhich Thin"toasted  w/ peanut butter (no jelly: about 20 net carbs  "Bagel Thin" with cream cheese and salmon: about 20 carbs   a scrambled egg/bacon/cheese burrito made with Mission's "carb balance" whole wheat tortilla  (about 10 net carbs )   Avoid cereal and bananas, oatmeal and cream of wheat and grits. They are loaded with carbohydrates!   10 AM: high protein snack  Protein bar by Atkins (the snack size, under 200 cal, usually < 6 net carbs).    A stick of cheese:  Around 1 carb,  100 cal     Dannon Light n Fit [place] Yogurt  (80 cal, 8 carbs)  Other so called "protein bars" and Greek yogurts tend to be loaded with carbohydrates.  Remember, in food advertising, the word "energy" is synonymous for " carbohydrate."  Lunch:   A Sandwich using the bread choices listed, Can use any  Eggs,  lunchmeat, grilled meat or canned tuna), avocado, regular mayo/mustard  and cheese.  A Salad using clue cheese, ranch,  Goddess or vinagrette,  No croutons or "confetti" and no "candied nuts" but regular nuts OK.   No pretzels or chips.  Pickles and  miniature sweet peppers are a good low carb alternative  The bread is the only source or carbohydrate that can be decreased (Joseph's makes a pita bread and a flat bread that are 50 cal and 4 net carbs ; Toufayan makes a low carb flatbread that's 100 cal and 9 net carbs  and  Mission's carb balance whole wheat tortilla  That is 210 cal and 6 net carbs) Avoid "Low fat dressings, as well as Reyne Dumas and [address] dressings They are loaded with sugar!   3 PM/ Mid day  Snack:  Consider  1 ounce of  almonds, walnuts, pistachios, pecans, peanuts,  Macadamia nuts or a nut medley.  Avoid "granola"; the dried cranberries and raisins are loaded with carbohydrates. Mixed nuts ok if no raisins or cranberries or dried fruit.     6 PM  Dinner:    "mean and green, "  Meat/chicken/fish with a green salad, and broccoli, cauliflower, green beans, spinach, brussel sprouts or  Lima beans::       There is a low carb pasta by Dreamfield's available at [facility] that is acceptable and tastes great only 5 diestible carbs/serving.   Try Kai Levins Angelo's chicken piccata or  chicken or eggplant parm over low carb pasta.   Clifton Custard Sanchez's "Carnitas" (pulled pork, no sauce,  0 carbs) or his beef pot roast to make a dinner burrito  Whole wheat pasta is still full of digestible carbs and  Not as low in glycemic index as Dreamfield's.   Brown rice is still rice,  So skip the rice and noodles if you eat [place] or [place]  9 PM snack :   Breyer's "low carb" fudgsicle or  ice cream bar (Carb Smart line), or  Weight Watcher's ice cream bar , or another "no sugar added" ice cream;  a serving of fresh berries/cherries with whipped cream   Cheese or yoguty  Avoid bananas, pineapple, grapes  and watermelon on a regular basis because they are high in sugar)   Remember that snack Substitutions should be less than 10 carbs per serving and meals < 20 carbs. Remember to subtract fiber grams to get the "net carbs."

## 2012-08-05 NOTE — Progress Notes (Signed)
Patient ID: Kari [name], female   DOB: 1946-03-05, 67 y.o.   MRN: [id number] Irregular nevus right temple  Subjective:     Kari [name] is a 67 y.o. female and is here for a comprehensive physical exam. The patient reports no problems.  History   Social History  . Marital Status: Married    Spouse Name: N/A    Number of Children: N/A  . Years of Education: N/A   Occupational History  . Not on file.   Social History Main Topics  . Smoking status: Former Smoker    Quit date: 01/02/1971  . Smokeless tobacco: Never Used  . Alcohol Use: Yes     Comment: occasional- wine gives pt headache  . Drug Use: No  . Sexually Active: Not on file   Other Topics Concern  . Not on file   Social History Narrative  . No narrative on file   Health Maintenance  Topic Date Due  . Zostavax  09/01/2007  . Influenza Vaccine  01/11/2013  . Pap Smear  04/18/2013  . Mammogram  12/31/2013  . Colonoscopy  05/14/2015  . Tetanus/tdap  08/18/2017    The following portions of the patient's history were reviewed and updated as appropriate: allergies, current medications, past family history, past medical history, past social history, past surgical history and problem list.  Review of Systems A comprehensive review of systems was negative.   Objective:   BP 142/82  Pulse 86  Temp(Src) 97.6 F (36.4 C) (Oral)  Resp 16  Ht 5\' 4"  (1.626 m)  Wt 231 lb 4 oz (104.894 kg)  BMI 39.67 kg/m2  SpO2 99%  General Appearance:    Alert, cooperative, no distress, appears stated age  Head:    Normocephalic, without obvious abnormality, atraumatic  Eyes:    PERRL, conjunctiva/corneas clear, EOM's intact, fundi    benign, both eyes  Ears:    Normal TM's and external ear canals, both ears  Nose:   Nares normal, septum midline, mucosa normal, no drainage    or sinus tenderness  Throat:   Lips, mucosa, and tongue normal; teeth and gums normal  Neck:   Supple, symmetrical, trachea midline, no adenopathy;   thyroid:  no enlargement/tenderness/nodules; no carotid   bruit or JVD  Back:     Symmetric, no curvature, ROM normal, no CVA tenderness  Lungs:     Clear to auscultation bilaterally, respirations unlabored  Chest Wall:    No tenderness or deformity   Heart:    Regular rate and rhythm, S1 and S2 normal, no murmur, rub   or gallop  Breast Exam:    No tenderness, masses, or nipple abnormality  Abdomen:     Soft, non-tender, bowel sounds active all four quadrants,    no masses, no organomegaly  Genitalia:    Normal female without lesion, discharge or tenderness     Extremities:   Extremities normal, atraumatic, no cyanosis or edema  Pulses:   2+ and symmetric all extremities  Skin:   2 mm nevus with irregular border, right temple  Lymph nodes:   Cervical, supraclavicular, and axillary nodes normal  Neurologic:   CNII-XII intact, normal strength, sensation and reflexes    throughout       Assessment:   Screening for malignant neoplasm of breast Annual exam including breast , pelvic and PAP smear were done today. Mammogram ordered.   Routine general medical examination at a health care facility Annual comprehensive exam was done including breast, pelvic  and PAP smear. All screenings have been addressed .   HYPERLIPIDEMIA elevated triglycerides, low HDL on recent labs.  Low Gi index diet recommended and regular exercise.    Updated Medication List Outpatient Encounter Prescriptions as of 08/05/2012  Medication Sig Dispense Refill  . ALPRAZolam (XANAX) 0.5 MG tablet Take 1 tablet (0.5 mg total) by mouth at bedtime as needed for sleep or anxiety.  30 tablet  0  . aspirin 325 MG tablet Take 325 mg by mouth daily.        Marland Kitchen atorvastatin (LIPITOR) 10 MG tablet TAKE ONE TABLET BY MOUTH ONE TIME DAILY  30 tablet  0  . cetirizine (ZYRTEC) 10 MG tablet Take 10 mg by mouth daily.        . diflorasone (PSORCON) 0.05 % ointment Apply topically daily.  30 g  3  . fluticasone (FLONASE) 50 MCG/ACT  nasal spray Place 2 sprays into the nose daily.  16 g  3  . mirtazapine (REMERON) 15 MG tablet Take 1 tablet (15 mg total) by mouth at bedtime.  45 tablet  0  . montelukast (SINGULAIR) 10 MG tablet Take 1 tablet (10 mg total) by mouth at bedtime.  90 tablet  3  . omeprazole (PRILOSEC) 20 MG capsule Take 20 mg by mouth daily.        . ramipril (ALTACE) 10 MG capsule TAKE ONE CAPSULE BY MOUTH ONE TIME DAILY  30 capsule  2  . triamcinolone lotion (KENALOG) 0.1 % APPLY TO THE AFFECTED AREA THREE TIMES DAILY.  60 mL  5  . cycloSPORINE (RESTASIS) 0.05 % ophthalmic emulsion 1 drop 2 (two) times daily.         No facility-administered encounter medications on file as of 08/05/2012.

## 2012-08-06 ENCOUNTER — Encounter: Payer: Self-pay | Admitting: Internal Medicine

## 2012-08-06 DIAGNOSIS — Z0000 Encounter for general adult medical examination without abnormal findings: Secondary | ICD-10-CM | POA: Insufficient documentation

## 2012-08-06 NOTE — Assessment & Plan Note (Signed)
elevated triglycerides, low HDL on recent labs.  Low Gi index diet recommended and regular exercise.

## 2012-08-06 NOTE — Assessment & Plan Note (Signed)
Annual comprehensive exam was done including breast, pelvic and PAP smear. All screenings have been addressed .  

## 2012-08-06 NOTE — Assessment & Plan Note (Signed)
Annual exam including breast , pelvic and PAP smear were done today. Mammogram ordered.

## 2012-08-10 ENCOUNTER — Other Ambulatory Visit: Payer: Self-pay | Admitting: Internal Medicine

## 2012-08-11 NOTE — Progress Notes (Signed)
Pt notified per request

## 2012-09-04 ENCOUNTER — Other Ambulatory Visit: Payer: Self-pay | Admitting: Internal Medicine

## 2012-09-16 ENCOUNTER — Other Ambulatory Visit: Payer: Self-pay | Admitting: Internal Medicine

## 2012-09-16 NOTE — Telephone Encounter (Signed)
Patient left voice mail asking for the prescription for mirtazapine she states she picked this refill up yesterday however she is running short by the end of the month since they are only giving her 45 pills when she is taken 2 pills per day. She said that the prescription reads for the first two weeks take 1 tablet and after the first two weeks take 2 tablets daily.  She said that she needs a prescription for 60 pills instead of 45 pills. Please advise.

## 2012-09-16 NOTE — Telephone Encounter (Signed)
Changed the directions to read 15 mg 2 tabs daily for pt to get #60. Or can a 30 mg 1 tablet at night be sent instead? Thanks.

## 2012-09-17 MED ORDER — MIRTAZAPINE 30 MG PO TABS: 30.0000 mg | ORAL_TABLET | Freq: Every day | ORAL | Status: DC

## 2012-09-17 NOTE — Telephone Encounter (Signed)
Ok to refill, with changes made   To 30 mg tablet #30

## 2012-10-12 ENCOUNTER — Other Ambulatory Visit: Payer: Self-pay | Admitting: Internal Medicine

## 2012-11-03 ENCOUNTER — Other Ambulatory Visit: Payer: Self-pay | Admitting: Internal Medicine

## 2012-11-04 DIAGNOSIS — D1801 Hemangioma of skin and subcutaneous tissue: Secondary | ICD-10-CM | POA: Diagnosis not present

## 2012-11-04 DIAGNOSIS — D235 Other benign neoplasm of skin of trunk: Secondary | ICD-10-CM | POA: Diagnosis not present

## 2012-12-04 ENCOUNTER — Other Ambulatory Visit: Payer: Self-pay | Admitting: Internal Medicine

## 2012-12-16 ENCOUNTER — Other Ambulatory Visit: Payer: Self-pay

## 2013-01-06 ENCOUNTER — Other Ambulatory Visit: Payer: Self-pay | Admitting: Internal Medicine

## 2013-01-27 ENCOUNTER — Ambulatory Visit: Payer: 59 | Admitting: Internal Medicine

## 2013-01-27 ENCOUNTER — Other Ambulatory Visit: Payer: Self-pay | Admitting: Internal Medicine

## 2013-03-02 ENCOUNTER — Encounter: Payer: Self-pay | Admitting: *Deleted

## 2013-03-03 ENCOUNTER — Encounter: Payer: Self-pay | Admitting: Internal Medicine

## 2013-03-03 ENCOUNTER — Ambulatory Visit (INDEPENDENT_AMBULATORY_CARE_PROVIDER_SITE_OTHER): Payer: Medicare Other | Admitting: Internal Medicine

## 2013-03-03 VITALS — BP 138/78 | HR 87 | Temp 97.9°F | Resp 14 | Ht 64.0 in | Wt 231.5 lb

## 2013-03-03 DIAGNOSIS — E785 Hyperlipidemia, unspecified: Secondary | ICD-10-CM

## 2013-03-03 DIAGNOSIS — R002 Palpitations: Secondary | ICD-10-CM

## 2013-03-03 DIAGNOSIS — E669 Obesity, unspecified: Secondary | ICD-10-CM

## 2013-03-03 DIAGNOSIS — Z1239 Encounter for other screening for malignant neoplasm of breast: Secondary | ICD-10-CM

## 2013-03-03 DIAGNOSIS — R7309 Other abnormal glucose: Secondary | ICD-10-CM

## 2013-03-03 DIAGNOSIS — Z23 Encounter for immunization: Secondary | ICD-10-CM

## 2013-03-03 DIAGNOSIS — R739 Hyperglycemia, unspecified: Secondary | ICD-10-CM

## 2013-03-03 DIAGNOSIS — I10 Essential (primary) hypertension: Secondary | ICD-10-CM

## 2013-03-03 NOTE — Patient Instructions (Addendum)
Your palpitations sound very innocent at this point and may be due to excessive caffeine in your diet.  If they become more frequent (daily) we can refer you for a Holter monitor to document what is happening  We will contact you with the lab results in a few days  You received the flu and pneumonia vaccines today  Your mammogram is overdue,  We have ordered it today    Please return in 3 months for your annual physical  I still want you to lose weight .  This is  my version of a  "Low GI"  Diet:  It will still lower your blood sugars and allow you to lose 4 to 8  lbs  per month if you follow it carefully.  Your goal with exercise is a minimum of 30 minutes of aerobic exercise 5 days per week (Walking does not count once it becomes easy!)    All of the foods can be found at grocery stores and in bulk at [employer].  The Atkins protein bars and shakes are available in more varieties at Target, WalMart and [employer] Foods.     7 AM Breakfast:  Choose from the following:  Low carbohydrate Protein  Shakes (I recommend the EAS AdvantEdge "Carb Control" shakes  Or the low carb shakes by Atkins.    2.5 carbs   Arnold's "Sandwhich Thin"toasted  w/ peanut butter (no jelly: about 20 net carbs  "Bagel Thin" with cream cheese and salmon: about 20 carbs   a scrambled egg/bacon/cheese burrito made with Mission's "carb balance" whole wheat tortilla  (about 10 net carbs )   Avoid cereal and bananas, oatmeal and cream of wheat and grits. They are loaded with carbohydrates!   10 AM: high protein snack  Protein bar by Atkins (the snack size, under 200 cal, usually < 6 net carbs).    A stick of cheese:  Around 1 carb,  100 cal     Dannon Light n Fit [place] Yogurt  (80 cal, 8 carbs)  Other so called "protein bars" and Greek yogurts tend to be loaded with carbohydrates.  Remember, in food advertising, the word "energy" is synonymous for " carbohydrate."  Lunch:   A Sandwich using the bread choices listed,  Can use any  Eggs,  lunchmeat, grilled meat or canned tuna), avocado, regular mayo/mustard  and cheese.  A Salad using blue cheese, ranch,  Goddess or vinagrette,  No croutons or "confetti" and no "candied nuts" but regular nuts OK.   No pretzels or chips.  Pickles and miniature sweet peppers are a good low carb alternative that provide a "crunch"  The bread is the only source of carbohydrate in a sandwich and  can be decreased by trying some of these alternatives to traditional loaf bread  Joseph's makes a pita bread and a flat bread that are 50 cal and 4 net carbs available at BJs and WalMart.  This can be toasted to use with hummous as well  Toufayan makes a low carb flatbread that's 100 cal and 9 net carbs available at [employer] and [employer] makes 2 sizes of  Low carb whole wheat tortilla  (The large one is 210 cal and 6 net carbs) Avoid "Low fat dressings, as well as Reyne Dumas and [address] dressings They are loaded with sugar!   3 PM/ Mid day  Snack:  Consider  1 ounce of  almonds, walnuts, pistachios, pecans, peanuts,  Macadamia nuts or a  nut medley.  Avoid "granola"; the dried cranberries and raisins are loaded with carbohydrates. Mixed nuts as long as there are no raisins,  cranberries or dried fruit.     6 PM  Dinner:     Meat/fowl/fish with a green salad, and either broccoli, cauliflower, green beans, spinach, brussel sprouts or  Lima beans. DO NOT BREAD THE PROTEIN!!      There is a low carb pasta by Dreamfield's that is acceptable and tastes great: only 5 digestible carbs/serving.( All grocery stores but BJs carry it )  Try Kai Levins Angelo's chicken piccata or chicken or eggplant parm over low carb pasta.(Lowes and BJs)   Clifton Custard Sanchez's "Carnitas" (pulled pork, no sauce,  0 carbs) or his beef pot roast to make a dinner burrito (at [employer]J's)  Pesto over low carb pasta (bj's sells a good quality pesto in the center refrigerated section of the deli   Whole wheat pasta is still  full of digestible carbs and  Not as low in glycemic index as Dreamfield's.   Brown rice is still rice,  So skip the rice and noodles if you eat [place] or [place] (or at least limit to 1/2 cup)  9 PM snack :   Breyer's "low carb" fudgsicle or  ice cream bar (Carb Smart line), or  Weight Watcher's ice cream bar , or another "no sugar added" ice cream;  a serving of fresh berries/cherries with whipped cream   Cheese or DANNON'S LlGHT [place] FIT [place] YOGURT  Avoid bananas, pineapple, grapes  and watermelon on a regular basis because they are high in sugar.  THINK OF THEM AS DESSERT  Remember that snack Substitutions should be less than 10 NET carbs per serving and meals < 20 carbs. Remember to subtract fiber grams to get the "net carbs."

## 2013-03-03 NOTE — Progress Notes (Signed)
Patient ID: Kari [name], female   DOB: 1945/07/01, 67 y.o.   MRN: [id number]   Patient Active Problem List   Diagnosis Date Noted  . Obesity, unspecified 03/04/2013  . Routine general medical examination at a health care facility 08/06/2012  . Palpitations 07/25/2011  . Menopause   . Hypertension   . Asthma   . Screening for malignant neoplasm of breast 01/02/2011  . Hearing loss 01/02/2011  . UNSPECIFIED VITAMIN D DEFICIENCY 08/19/2007  . POSTMENOPAUSAL STATUS 08/19/2007  . COLONIC POLYPS, BENIGN, HX OF 08/19/2007  . HYPERLIPIDEMIA 08/18/2007  . HYPERTENSION 08/18/2007  . ALLERGIC RHINITIS 08/18/2007  . ASTHMA 08/18/2007  . GERD 08/18/2007  . IBS 08/18/2007  . PSORIASIS 08/18/2007  . OSTEOARTHRITIS 08/18/2007  . MIGRAINES, HX OF 08/18/2007    Subjective:  CC:   Chief Complaint  Patient presents with  . Follow-up    6 month    HPI:   Mersedes [name] a 67 y.o. female who presents Follow up on chronic conditions including hypertension, hyperlipidemia and obesity.  She feels generally well except for occasional palpitations occurring for the past 2 months, both at rest and with activity .  The episodes last for a few minutes ,  No chest pain no arm pain, no dyspnea or syncop   Past Medical History  Diagnosis Date  . Hyperlipidemia   . Menopause   . Hypertension   . Migraines   . Asthma   . Miscarriage     X 1  . Allergy     to cat, dogs, pollen, dust, cigarette smoke    Past Surgical History  Procedure Laterality Date  . Tubal ligation  1980  . Cesarean section      X 2       The following portions of the patient's history were reviewed and updated as appropriate: Allergies, current medications, and problem list.    Review of Systems:   12 Pt  review of systems was negative except those addressed in the HPI,     History   Social History  . Marital Status: Married    Spouse Name: N/A    Number of Children: N/A  . Years of Education: N/A    Occupational History  . Not on file.   Social History Main Topics  . Smoking status: Former Smoker    Quit date: 01/02/1971  . Smokeless tobacco: Never Used  . Alcohol Use: Yes     Comment: occasional- wine gives pt headache  . Drug Use: No  . Sexual Activity: Not on file   Other Topics Concern  . Not on file   Social History Narrative  . No narrative on file    Objective:  Filed Vitals:   03/03/13 1509  BP: 138/78  Pulse: 87  Temp: 97.9 F (36.6 C)  Resp: 14     General appearance: alert, cooperative and appears stated age Ears: normal TM's and external ear canals both ears Throat: lips, mucosa, and tongue normal; teeth and gums normal Neck: no adenopathy, no carotid bruit, supple, symmetrical, trachea midline and thyroid not enlarged, symmetric, no tenderness/mass/nodules Back: symmetric, no curvature. ROM normal. No CVA tenderness. Lungs: clear to auscultation bilaterally Heart: regular rate and rhythm, S1, S2 normal, no murmur, click, rub or gallop Abdomen: soft, non-tender; bowel sounds normal; no masses,  no organomegaly Pulses: 2+ and symmetric Skin: Skin color, texture, turgor normal. No rashes or lesions Lymph nodes: Cervical, supraclavicular, and axillary nodes normal.  Assessment and Plan:  HYPERLIPIDEMIA LDL is < 100 on atorvastatin but she has elevated triglycerides, low HDL on recent labs.  Low Gi index diet recommended and regular exercise. Repeat in 3 months and start fenofibrate if still > 200  . Lab Results  Component Value Date   CHOL 187 03/03/2013   HDL 42.20 03/03/2013   LDLCALC 80 07/29/2012   LDLDIRECT 99.8 03/03/2013   TRIG 356.0* 03/03/2013   CHOLHDL 4 03/03/2013      HYPERTENSION Well controlled on current regimen. Renal function stable, no changes today.  Lab Results  Component Value Date   CREATININE 1.1 03/03/2013    Palpitations EKG is normal. Exam normal, lytes normal  History does not suggest any serious  disorder.  Reassurance provided.   Obesity, unspecified I have addressed  BMI and recommended a low glycemic index diet utilizing smaller more frequent meals to increase metabolism.  I have also recommended that patient start exercising with a goal of 30 minutes of aerobic exercise a minimum of 5 days per week. Screening for lipid disorders, thyroid and diabetes was done today.   A total of 40 minutes was spent with patient more than half of which was spent in counseling, reviewing records from other prviders and coordination of care.   Updated Medication List Outpatient Encounter Prescriptions as of 03/03/2013  Medication Sig Dispense Refill  . ALPRAZolam (XANAX) 0.5 MG tablet Take 1 tablet (0.5 mg total) by mouth at bedtime as needed for sleep or anxiety.  30 tablet  0  . aspirin 325 MG tablet Take 325 mg by mouth daily.        Marland Kitchen atorvastatin (LIPITOR) 10 MG tablet TAKE ONE TABLET BY MOUTH ONE TIME DAILY   30 tablet  5  . cetirizine (ZYRTEC) 10 MG tablet Take 10 mg by mouth daily.        . cycloSPORINE (RESTASIS) 0.05 % ophthalmic emulsion 1 drop 2 (two) times daily.        . diflorasone (PSORCON) 0.05 % ointment Apply topically daily.  30 g  3  . mirtazapine (REMERON) 30 MG tablet TAKE ONE TABLET BY MOUTH NIGHTLY AT BEDTIME   30 tablet  2  . montelukast (SINGULAIR) 10 MG tablet TAKE ONE TABLET BY MOUTH NIGHTLY AT BEDTIME  30 tablet  5  . omeprazole (PRILOSEC) 20 MG capsule Take 20 mg by mouth daily.        . ramipril (ALTACE) 10 MG capsule TAKE ONE CAPSULE BY MOUTH ONE TIME DAILY  30 capsule  5  . triamcinolone lotion (KENALOG) 0.1 % Apply to affected area three times daily  60 mL  4  . fluticasone (FLONASE) 50 MCG/ACT nasal spray Place 2 sprays into the nose daily.  16 g  3   No facility-administered encounter medications on file as of 03/03/2013.     Orders Placed This Encounter  Procedures  . MM Digital Screening  . HM MAMMOGRAPHY  . Flu Vaccine QUAD 36+ mos PF IM (Fluarix)  .  Pneumococcal conjugate vaccine 13-valent less than 5yo IM  . Hemoglobin A1c  . Lipid panel  . Comprehensive metabolic panel  . TSH  . Magnesium  . HM PAP SMEAR  . LDL cholesterol, direct  . EKG 12-Lead    Return in about 3 months (around 06/03/2013).

## 2013-03-04 DIAGNOSIS — E669 Obesity, unspecified: Secondary | ICD-10-CM | POA: Insufficient documentation

## 2013-03-04 DIAGNOSIS — E66811 Obesity, class 1: Secondary | ICD-10-CM | POA: Insufficient documentation

## 2013-03-04 HISTORY — DX: Obesity, class 1: E66.811

## 2013-03-04 HISTORY — DX: Obesity, unspecified: E66.9

## 2013-03-04 LAB — HEMOGLOBIN A1C: Hgb A1c MFr Bld: 5.7 % (ref 4.6–6.5)

## 2013-03-04 LAB — COMPREHENSIVE METABOLIC PANEL
ALT: 23 U/L (ref 0–35)
AST: 22 U/L (ref 0–37)
Albumin: 3.9 g/dL (ref 3.5–5.2)
Alkaline Phosphatase: 108 U/L (ref 39–117)
BUN: 16 mg/dL (ref 6–23)
Calcium: 9.1 mg/dL (ref 8.4–10.5)
Chloride: 101 mEq/L (ref 96–112)
Creatinine, Ser: 1.1 mg/dL (ref 0.4–1.2)
GFR: 54.03 mL/min — ABNORMAL LOW (ref >60.00)
Glucose, Bld: 100 mg/dL — ABNORMAL HIGH (ref 70–99)
Potassium: 4.6 mEq/L (ref 3.5–5.1)
Sodium: 137 mEq/L (ref 135–145)
Total Protein: 6.4 g/dL (ref 6.0–8.3)

## 2013-03-04 LAB — MAGNESIUM: Magnesium: 2.1 mg/dL (ref 1.5–2.5)

## 2013-03-04 LAB — LDL CHOLESTEROL, DIRECT: Direct LDL: 99.8 mg/dL

## 2013-03-04 LAB — LIPID PANEL
HDL: 42.2 mg/dL (ref >39.00)
Total CHOL/HDL Ratio: 4

## 2013-03-04 NOTE — Assessment & Plan Note (Signed)
EKG is normal. Exam normal, lytes normal  History does not suggest any serious disorder.  Reassurance provided.

## 2013-03-04 NOTE — Assessment & Plan Note (Signed)
I have addressed  BMI and recommended a low glycemic index diet utilizing smaller more frequent meals to increase metabolism.  I have also recommended that patient start exercising with a goal of 30 minutes of aerobic exercise a minimum of 5 days per week. Screening for lipid disorders, thyroid and diabetes was  done today.   

## 2013-03-04 NOTE — Assessment & Plan Note (Addendum)
LDL is < 100 on atorvastatin but she has elevated triglycerides, low HDL on recent labs.  Low Gi index diet recommended and regular exercise. Repeat in 3 months and start fenofibrate if still > 200  . Lab Results  Component Value Date   CHOL 187 03/03/2013   HDL 42.20 03/03/2013   LDLCALC 80 07/29/2012   LDLDIRECT 99.8 03/03/2013   TRIG 356.0* 03/03/2013   CHOLHDL 4 03/03/2013

## 2013-03-04 NOTE — Assessment & Plan Note (Signed)
Well controlled on current regimen. Renal function stable, no changes today.  Lab Results  Component Value Date   CREATININE 1.1 03/03/2013

## 2013-03-05 ENCOUNTER — Telehealth: Payer: Self-pay | Admitting: Emergency Medicine

## 2013-03-05 NOTE — Telephone Encounter (Signed)
LVM for patient to call our office in reference to mammogram apt scheduled with [facility] [place]) on October 29th at 1245 pm.

## 2013-03-09 NOTE — Telephone Encounter (Signed)
Left mess with family member tor return my call

## 2013-04-05 ENCOUNTER — Other Ambulatory Visit: Payer: Self-pay | Admitting: Internal Medicine

## 2013-04-14 DIAGNOSIS — Z1231 Encounter for screening mammogram for malignant neoplasm of breast: Secondary | ICD-10-CM | POA: Diagnosis not present

## 2013-04-14 LAB — HM MAMMOGRAPHY

## 2013-05-05 ENCOUNTER — Encounter: Payer: Self-pay | Admitting: Internal Medicine

## 2013-05-11 ENCOUNTER — Other Ambulatory Visit: Payer: Self-pay | Admitting: Internal Medicine

## 2013-06-02 ENCOUNTER — Ambulatory Visit: Payer: Medicare Other | Admitting: Internal Medicine

## 2013-06-07 ENCOUNTER — Encounter: Payer: Self-pay | Admitting: Internal Medicine

## 2013-06-30 ENCOUNTER — Ambulatory Visit: Payer: Medicare Other | Admitting: Internal Medicine

## 2013-07-09 ENCOUNTER — Other Ambulatory Visit: Payer: Self-pay | Admitting: Internal Medicine

## 2013-08-04 ENCOUNTER — Other Ambulatory Visit: Payer: Self-pay | Admitting: Internal Medicine

## 2013-08-05 ENCOUNTER — Other Ambulatory Visit: Payer: Self-pay | Admitting: *Deleted

## 2013-08-06 MED ORDER — MIRTAZAPINE 30 MG PO TABS: ORAL_TABLET | ORAL | Status: DC

## 2013-08-06 NOTE — Telephone Encounter (Signed)
Appt 08/11/13

## 2013-08-11 ENCOUNTER — Telehealth: Payer: Self-pay | Admitting: Internal Medicine

## 2013-08-11 ENCOUNTER — Encounter: Payer: Self-pay | Admitting: Internal Medicine

## 2013-08-11 ENCOUNTER — Ambulatory Visit (INDEPENDENT_AMBULATORY_CARE_PROVIDER_SITE_OTHER): Payer: Medicare Other | Admitting: Internal Medicine

## 2013-08-11 VITALS — BP 148/98 | HR 97 | Temp 97.7°F | Resp 18 | Wt 233.2 lb

## 2013-08-11 DIAGNOSIS — R739 Hyperglycemia, unspecified: Secondary | ICD-10-CM

## 2013-08-11 DIAGNOSIS — E559 Vitamin D deficiency, unspecified: Secondary | ICD-10-CM

## 2013-08-11 DIAGNOSIS — F325 Major depressive disorder, single episode, in full remission: Secondary | ICD-10-CM | POA: Insufficient documentation

## 2013-08-11 DIAGNOSIS — F3289 Other specified depressive episodes: Secondary | ICD-10-CM

## 2013-08-11 DIAGNOSIS — R5383 Other fatigue: Secondary | ICD-10-CM

## 2013-08-11 DIAGNOSIS — R5381 Other malaise: Secondary | ICD-10-CM

## 2013-08-11 DIAGNOSIS — I10 Essential (primary) hypertension: Secondary | ICD-10-CM | POA: Diagnosis not present

## 2013-08-11 DIAGNOSIS — E669 Obesity, unspecified: Secondary | ICD-10-CM | POA: Diagnosis not present

## 2013-08-11 DIAGNOSIS — Z0000 Encounter for general adult medical examination without abnormal findings: Secondary | ICD-10-CM | POA: Diagnosis not present

## 2013-08-11 DIAGNOSIS — J309 Allergic rhinitis, unspecified: Secondary | ICD-10-CM

## 2013-08-11 DIAGNOSIS — E785 Hyperlipidemia, unspecified: Secondary | ICD-10-CM

## 2013-08-11 DIAGNOSIS — Z86010 Personal history of colonic polyps: Secondary | ICD-10-CM

## 2013-08-11 DIAGNOSIS — F329 Major depressive disorder, single episode, unspecified: Secondary | ICD-10-CM

## 2013-08-11 LAB — LIPID PANEL
CHOL/HDL RATIO: 4
Cholesterol: 174 mg/dL (ref 0–200)
HDL: 39.6 mg/dL (ref >39.00)
LDL CALC: 70 mg/dL (ref 0–99)
TRIGLYCERIDES: 321 mg/dL — AB (ref 0.0–149.0)
VLDL: 64.2 mg/dL — ABNORMAL HIGH (ref 0.0–40.0)

## 2013-08-11 LAB — COMPREHENSIVE METABOLIC PANEL
ALBUMIN: 3.9 g/dL (ref 3.5–5.2)
ALK PHOS: 99 U/L (ref 39–117)
ALT: 23 U/L (ref 0–35)
AST: 21 U/L (ref 0–37)
BUN: 19 mg/dL (ref 6–23)
CO2: 26 meq/L (ref 19–32)
Calcium: 9.4 mg/dL (ref 8.4–10.5)
Chloride: 102 mEq/L (ref 96–112)
Creatinine, Ser: 1.2 mg/dL (ref 0.4–1.2)
GFR: 46.88 mL/min — AB (ref >60.00)
GLUCOSE: 120 mg/dL — AB (ref 70–99)
POTASSIUM: 4.5 meq/L (ref 3.5–5.1)
Sodium: 138 mEq/L (ref 135–145)
TOTAL PROTEIN: 7 g/dL (ref 6.0–8.3)
Total Bilirubin: 0.7 mg/dL (ref 0.3–1.2)

## 2013-08-11 LAB — TSH: TSH: 0.74 u[IU]/mL (ref 0.35–5.50)

## 2013-08-11 MED ORDER — AZELASTINE-FLUTICASONE 137-50 MCG/ACT NA SUSP: 2.0000 | Freq: Every day | NASAL | Status: DC

## 2013-08-11 MED ORDER — ZOSTER VACCINE LIVE 19400 UNT/0.65ML ~~LOC~~ SOLR: 0.6500 mL | Freq: Once | SUBCUTANEOUS | Status: DC

## 2013-08-11 MED ORDER — HYDROCHLOROTHIAZIDE 25 MG PO TABS: 25.0000 mg | ORAL_TABLET | Freq: Every day | ORAL | Status: DC

## 2013-08-11 MED ORDER — DIFLORASONE DIACETATE 0.05 % EX OINT: TOPICAL_OINTMENT | Freq: Every day | CUTANEOUS | Status: DC

## 2013-08-11 MED ORDER — MIRTAZAPINE 45 MG PO TABS: ORAL_TABLET | ORAL | Status: DC

## 2013-08-11 MED ORDER — DIFLORASONE DIACETATE 0.05 % EX CREA: 1.0000 "application " | TOPICAL_CREAM | Freq: Two times a day (BID) | CUTANEOUS | Status: DC

## 2013-08-11 NOTE — Assessment & Plan Note (Addendum)
Adding dymista for persistent rhinitis and sneezing,  Samples given,

## 2013-08-11 NOTE — Assessment & Plan Note (Addendum)
Lab Results  Component Value Date   CHOL 174 08/11/2013   HDL 39.60 08/11/2013   LDLCALC 70 08/11/2013   LDLDIRECT 99.8 03/03/2013   TRIG 321.0* 08/11/2013   CHOLHDL 4 08/11/2013    Elevated triglycerides.  Addressed with low glycemic index diet. Will repeat in 3 months

## 2013-08-11 NOTE — Assessment & Plan Note (Addendum)
Aggravated by recent death of mother.  I a Increasing her remeron to 45 mg daily

## 2013-08-11 NOTE — Progress Notes (Signed)
Patient ID: Kari [name], female   DOB: 1945/11/19, 68 y.o.   MRN: [id number]    Subjective:    Kari [name] is a 68 y.o. female who presents for an annual exam. The patient has several complaints today. The patient is not currently sexually active. GYN screening history: last pap: was normal. The patient wears seatbelts: yes. The patient participates in regular exercise: no. Has the patient ever been transfused or tattooed?: no. The patient reports that there is not domestic violence in her life.   Menstrual History: OB History   Grav Para Term Preterm Abortions TAB SAB Ect Mult Living                  Menarche age: 64  No LMP recorded. Patient is postmenopausal.    The following portions of the patient's history were reviewed and updated as appropriate: allergies, current medications, past family history, past medical history, past social history, past surgical history and problem list.  Review of Systems A comprehensive review of systems was negative except for: Ears, nose, mouth, throat, and face: positive for nasal congestion Behavioral/Psych: positive for depression    Objective:   BP 148/98  Pulse 97  Temp(Src) 97.7 F (36.5 C) (Oral)  Resp 18  Wt 233 lb 4 oz (105.802 kg)  SpO2 97%   General Appearance:    Alert, obese cooperative, no distress, appears stated age  Head:    Normocephalic, without obvious abnormality, atraumatic  Eyes:    PERRL, conjunctiva/corneas clear, EOM's intact, fundi    benign, both eyes  Ears:    Normal TM's and external ear canals, both ears  Nose:   Nares normal, septum midline, mucosa normal, no drainage    or sinus tenderness  Throat:   Lips, mucosa, and tongue normal; teeth and gums normal  Neck:   Supple, symmetrical, trachea midline, no adenopathy;    thyroid:  no enlargement/tenderness/nodules; no carotid   bruit or JVD  Back:     Symmetric, no curvature, ROM normal, no CVA tenderness  Lungs:     Clear to auscultation bilaterally,  respirations unlabored  Chest Wall:    No tenderness or deformity   Heart:    Regular rate and rhythm, S1 and S2 normal, no murmur, rub   or gallop  Breast Exam:    No tenderness, masses, or nipple abnormality  Abdomen:     Soft, non-tender, bowel sounds active all four quadrants,    no masses, no organomegaly     Extremities:   Extremities normal, atraumatic, no cyanosis or edema  Pulses:   2+ and symmetric all extremities  Skin:   Skin color, texture, turgor normal, no rashes or lesions  Lymph nodes:   Cervical, supraclavicular, and axillary nodes normal  Neurologic:   CNII-XII intact, normal strength, sensation and reflexes    throughout      Assessment and Plan:   Routine general medical examination at a health care facility Annual comprehensive exam was done including breast, excluding  pelvic and PAP smear. All screenings have been addressed .   Hypertension Elevated today.  Reviewed list of meds, patient is not taking OTC meds that could be causing,. It.  Have asked patient to recheck bp at home a minimum of 5 times over the next 4 weeks and call readings to office for adjustment of medications. Adding hctz to ramipril   ALLERGIC RHINITIS Adding dymista for persistent rhinitis and sneezing,  Samples given,   HYPERLIPIDEMIA Lab Results  Component Value Date   CHOL 174 08/11/2013   HDL 39.60 08/11/2013   LDLCALC 70 08/11/2013   LDLDIRECT 99.8 03/03/2013   TRIG 321.0* 08/11/2013   CHOLHDL 4 08/11/2013    Elevated triglycerides.  Addressed with low glycemic index diet. Will repeat in 3 months  Obesity, unspecified I have addressed  BMI and recommended wt loss of 10% of body weigh over the next 6 months using a low glycemic index diet and regular exercise a minimum of 5 days per week.    Depressive disorder, not elsewhere classified Aggravated by recent death of mother.  I a Increasing her remeron to 45 mg daily   COLONIC POLYPS, BENIGN, HX OF Due to her FH of colon CA  (sister at age 46) and the presecne of 5 polyps on 2012 colonoscopy she appears to be due to repeat colonoscopy his year by Dr Carlean Purl. Marland Kitchen   Updated Medication List Outpatient Encounter Prescriptions as of 08/11/2013  Medication Sig  . aspirin 325 MG tablet Take 325 mg by mouth daily.    Marland Kitchen atorvastatin (LIPITOR) 10 MG tablet TAKE ONE TABLET BY MOUTH ONE TIME DAILY   . cetirizine (ZYRTEC) 10 MG tablet Take 10 mg by mouth daily.    . cycloSPORINE (RESTASIS) 0.05 % ophthalmic emulsion 1 drop 2 (two) times daily.    . diflorasone (PSORCON) 0.05 % cream Apply 1 application topically 2 (two) times daily.  . diflorasone (PSORCON) 0.05 % ointment Apply topically daily.  . mirtazapine (REMERON) 45 MG tablet TAKE ONE TABLET BY MOUTH NIGHTLY AT BEDTIME  . montelukast (SINGULAIR) 10 MG tablet TAKE ONE TABLET BY MOUTH NIGHTLY AT BEDTIME   . omeprazole (PRILOSEC) 20 MG capsule Take 20 mg by mouth daily.    . ramipril (ALTACE) 10 MG capsule TAKE ONE CAPSULE BY MOUTH ONE TIME DAILY   . triamcinolone lotion (KENALOG) 0.1 % Apply to affected area three times daily  . [DISCONTINUED] diflorasone (PSORCON) 0.05 % cream Apply 1 application topically 2 (two) times daily.  . [DISCONTINUED] diflorasone (PSORCON) 0.05 % ointment Apply topically daily.  . [DISCONTINUED] mirtazapine (REMERON) 30 MG tablet TAKE ONE TABLET BY MOUTH NIGHTLY AT BEDTIME  . Azelastine-Fluticasone (DYMISTA) 137-50 MCG/ACT SUSP Place 2 Squirts into the nose daily. In each nostril  . hydrochlorothiazide (HYDRODIURIL) 25 MG tablet Take 1 tablet (25 mg total) by mouth daily.  Marland Kitchen zoster vaccine live, PF, (ZOSTAVAX) 61950 UNT/0.65ML injection Inject 19,400 Units into the skin once.  . [DISCONTINUED] fluticasone (FLONASE) 50 MCG/ACT nasal spray Place 2 sprays into the nose daily.

## 2013-08-11 NOTE — Assessment & Plan Note (Signed)
I have addressed  BMI and recommended wt loss of 10% of body weigh over the next 6 months using a low glycemic index diet and regular exercise a minimum of 5 days per week.   

## 2013-08-11 NOTE — Assessment & Plan Note (Signed)
Annual comprehensive exam was done including breast, excluding pelvic and PAP smear. All screenings have been addressed .  

## 2013-08-11 NOTE — Assessment & Plan Note (Addendum)
Elevated today.  Reviewed list of meds, patient is not taking OTC meds that could be causing,. It.  Have asked patient to recheck bp at home a minimum of 5 times over the next 4 weeks and call readings to office for adjustment of medications. Adding hctz to ramipril

## 2013-08-11 NOTE — Patient Instructions (Addendum)
You are at risk for developing diabetes  I want you to lose 23 lbs ( 10%) over the next 6 months using a low glycemic index diet  We are adding HCTZ 25 mg daily for your blood pressure ,  continue ramipril   Trial of Dymista nasal spary instead of flonase or nasocort

## 2013-08-11 NOTE — Telephone Encounter (Signed)
Relevant patient education assigned to patient using Emmi. ° °

## 2013-08-12 LAB — VITAMIN D 25 HYDROXY (VIT D DEFICIENCY, FRACTURES): VIT D 25 HYDROXY: 51 ng/mL (ref 30–89)

## 2013-08-13 ENCOUNTER — Encounter: Payer: Self-pay | Admitting: Internal Medicine

## 2013-08-13 NOTE — Assessment & Plan Note (Addendum)
Due to her FH of colon CA (sister at age 66) and the presecne of 5 polyps on 2012 colonoscopy she appears to be due to repeat colonoscopy his year by Dr Carlean Purl. Marland Kitchen

## 2013-08-13 NOTE — Addendum Note (Signed)
Addended by: [name] on: 08/13/2013 06:33 PM   Modules accepted: Orders

## 2013-08-16 ENCOUNTER — Encounter: Payer: Self-pay | Admitting: *Deleted

## 2013-09-02 ENCOUNTER — Other Ambulatory Visit: Payer: Self-pay | Admitting: Internal Medicine

## 2013-10-04 ENCOUNTER — Other Ambulatory Visit: Payer: Self-pay | Admitting: Internal Medicine

## 2013-10-12 ENCOUNTER — Encounter: Payer: Self-pay | Admitting: Internal Medicine

## 2013-11-10 ENCOUNTER — Other Ambulatory Visit: Payer: Self-pay | Admitting: Internal Medicine

## 2014-01-11 ENCOUNTER — Other Ambulatory Visit: Payer: Self-pay | Admitting: Internal Medicine

## 2014-01-12 DIAGNOSIS — Z23 Encounter for immunization: Secondary | ICD-10-CM | POA: Diagnosis not present

## 2014-02-02 DIAGNOSIS — H524 Presbyopia: Secondary | ICD-10-CM | POA: Diagnosis not present

## 2014-02-02 DIAGNOSIS — I10 Essential (primary) hypertension: Secondary | ICD-10-CM | POA: Diagnosis not present

## 2014-02-02 DIAGNOSIS — H2510 Age-related nuclear cataract, unspecified eye: Secondary | ICD-10-CM | POA: Diagnosis not present

## 2014-02-02 DIAGNOSIS — H52229 Regular astigmatism, unspecified eye: Secondary | ICD-10-CM | POA: Diagnosis not present

## 2014-02-02 DIAGNOSIS — H5210 Myopia, unspecified eye: Secondary | ICD-10-CM | POA: Diagnosis not present

## 2014-02-02 DIAGNOSIS — D3130 Benign neoplasm of unspecified choroid: Secondary | ICD-10-CM | POA: Diagnosis not present

## 2014-02-09 ENCOUNTER — Other Ambulatory Visit: Payer: Self-pay | Admitting: Internal Medicine

## 2014-03-08 ENCOUNTER — Other Ambulatory Visit: Payer: Self-pay | Admitting: Internal Medicine

## 2014-04-12 ENCOUNTER — Other Ambulatory Visit: Payer: Self-pay | Admitting: Internal Medicine

## 2014-05-15 ENCOUNTER — Other Ambulatory Visit: Payer: Self-pay | Admitting: Internal Medicine

## 2014-05-18 ENCOUNTER — Other Ambulatory Visit: Payer: Self-pay | Admitting: Internal Medicine

## 2014-07-18 ENCOUNTER — Other Ambulatory Visit: Payer: Self-pay | Admitting: Internal Medicine

## 2014-07-18 NOTE — Telephone Encounter (Signed)
Ok to refill,  Authorized in epic and sent  

## 2014-07-18 NOTE — Telephone Encounter (Signed)
Last refill 4.1.15.  Please advise refill

## 2014-08-16 ENCOUNTER — Other Ambulatory Visit: Payer: Self-pay | Admitting: Internal Medicine

## 2014-08-16 NOTE — Telephone Encounter (Signed)
Last OV 4.1.15.  Please advise refill

## 2014-08-16 NOTE — Telephone Encounter (Signed)
Last OV 4.1.15, last refill 3.6.16.  Please advise refill

## 2014-08-17 NOTE — Telephone Encounter (Signed)
Refill one 30 days only.  Has not been seen in over 12 months so needs office visit prior to any more refills

## 2014-08-17 NOTE — Telephone Encounter (Signed)
Ok to refill, with changes made

## 2014-09-13 ENCOUNTER — Other Ambulatory Visit: Payer: Self-pay | Admitting: Internal Medicine

## 2014-09-13 ENCOUNTER — Encounter: Payer: Self-pay | Admitting: Internal Medicine

## 2014-09-13 ENCOUNTER — Ambulatory Visit (INDEPENDENT_AMBULATORY_CARE_PROVIDER_SITE_OTHER): Payer: Medicare Other | Admitting: Internal Medicine

## 2014-09-13 VITALS — BP 148/92 | HR 84 | Temp 97.8°F | Resp 14 | Ht 64.25 in | Wt 237.5 lb

## 2014-09-13 DIAGNOSIS — M10071 Idiopathic gout, right ankle and foot: Secondary | ICD-10-CM

## 2014-09-13 DIAGNOSIS — Z1211 Encounter for screening for malignant neoplasm of colon: Secondary | ICD-10-CM

## 2014-09-13 DIAGNOSIS — IMO0001 Reserved for inherently not codable concepts without codable children: Secondary | ICD-10-CM

## 2014-09-13 DIAGNOSIS — I10 Essential (primary) hypertension: Secondary | ICD-10-CM

## 2014-09-13 DIAGNOSIS — R030 Elevated blood-pressure reading, without diagnosis of hypertension: Secondary | ICD-10-CM | POA: Diagnosis not present

## 2014-09-13 DIAGNOSIS — M25579 Pain in unspecified ankle and joints of unspecified foot: Secondary | ICD-10-CM | POA: Diagnosis not present

## 2014-09-13 DIAGNOSIS — M109 Gout, unspecified: Secondary | ICD-10-CM

## 2014-09-13 DIAGNOSIS — Z0000 Encounter for general adult medical examination without abnormal findings: Secondary | ICD-10-CM | POA: Diagnosis not present

## 2014-09-13 DIAGNOSIS — E785 Hyperlipidemia, unspecified: Secondary | ICD-10-CM

## 2014-09-13 DIAGNOSIS — Z1239 Encounter for other screening for malignant neoplasm of breast: Secondary | ICD-10-CM

## 2014-09-13 LAB — LIPID PANEL
CHOL/HDL RATIO: 4
Cholesterol: 175 mg/dL (ref 0–200)
HDL: 39.3 mg/dL (ref >39.00)
NONHDL: 135.7
Triglycerides: 293 mg/dL — ABNORMAL HIGH (ref 0.0–149.0)
VLDL: 58.6 mg/dL — ABNORMAL HIGH (ref 0.0–40.0)

## 2014-09-13 LAB — COMPREHENSIVE METABOLIC PANEL
ALBUMIN: 4 g/dL (ref 3.5–5.2)
ALK PHOS: 88 U/L (ref 39–117)
ALT: 20 U/L (ref 0–35)
AST: 17 U/L (ref 0–37)
BUN: 22 mg/dL (ref 6–23)
CO2: 28 mEq/L (ref 19–32)
Calcium: 9.6 mg/dL (ref 8.4–10.5)
Chloride: 100 mEq/L (ref 96–112)
Creatinine, Ser: 1.27 mg/dL — ABNORMAL HIGH (ref 0.40–1.20)
GFR: 44.61 mL/min — ABNORMAL LOW (ref >60.00)
Glucose, Bld: 101 mg/dL — ABNORMAL HIGH (ref 70–99)
POTASSIUM: 4.3 meq/L (ref 3.5–5.1)
Sodium: 137 mEq/L (ref 135–145)
Total Bilirubin: 0.7 mg/dL (ref 0.2–1.2)
Total Protein: 6.8 g/dL (ref 6.0–8.3)

## 2014-09-13 LAB — CBC WITH DIFFERENTIAL/PLATELET
BASOS ABS: 0 10*3/uL (ref 0.0–0.1)
Basophils Relative: 0.6 % (ref 0.0–3.0)
EOS ABS: 0.2 10*3/uL (ref 0.0–0.7)
Eosinophils Relative: 2.8 % (ref 0.0–5.0)
HEMATOCRIT: 36.1 % (ref 36.0–46.0)
HEMOGLOBIN: 12.5 g/dL (ref 12.0–15.0)
Lymphocytes Relative: 19.2 % (ref 12.0–46.0)
Lymphs Abs: 1.4 10*3/uL (ref 0.7–4.0)
MCHC: 34.6 g/dL (ref 30.0–36.0)
MCV: 85.5 fl (ref 78.0–100.0)
MONO ABS: 0.5 10*3/uL (ref 0.1–1.0)
Monocytes Relative: 7.2 % (ref 3.0–12.0)
NEUTROS ABS: 5.1 10*3/uL (ref 1.4–7.7)
Neutrophils Relative %: 70.2 % (ref 43.0–77.0)
Platelets: 242 10*3/uL (ref 150.0–400.0)
RBC: 4.22 Mil/uL (ref 3.87–5.11)
RDW: 15.1 % (ref 11.5–15.5)
WBC: 7.2 10*3/uL (ref 4.0–10.5)

## 2014-09-13 LAB — LDL CHOLESTEROL, DIRECT: Direct LDL: 83 mg/dL

## 2014-09-13 LAB — C-REACTIVE PROTEIN: CRP: 1 mg/dL (ref 0.5–20.0)

## 2014-09-13 LAB — URIC ACID: Uric Acid, Serum: 10.4 mg/dL — ABNORMAL HIGH (ref 2.4–7.0)

## 2014-09-13 NOTE — Telephone Encounter (Signed)
Left message on VM to return call 

## 2014-09-13 NOTE — Progress Notes (Signed)
Patient ID: Kari [name], female   DOB: 05/25/1945, 69 y.o.   MRN: [id number]  The patient is here for annual Medicare wellness examination and management of other chronic and acute problems. Last seen a year ago for her annual exam.   Was supposed to have colonoscopy last year,  Did not have it done.   Self treated for "gout "  3 months ago with borrowed colchicine .  tues feb 23rd started having right foot pain .   Symptoms started on lateral 5th MT and spread to entire foot, big toe was affected  The worst.  meds started on feb 28 x 10 days .Only took colchicine once daily for 10 days,  initial episode resolved only for a few days .    Had several more episodes involving the left  foot.  which she also treated with colchicine,  Occurred only on the weekends,  Was dehydrated at the time,  stopped drinking diet coke and [place]  blueberry iced tea last week and has had no recurrences.    No falls,  Occasional insomonia,  Now bowel issue     Obesity;  Wants to lose weight ,  Planning an [place] cruise in April 2017 ,  Has exercise bike and weight s      The risk factors are reflected in the social history.  The roster of all physicians providing medical care to patient - is listed in the Snapshot section of the chart.  Activities of daily living:  The patient is 100% independent in all ADLs: dressing, toileting, feeding as well as independent mobility  Home safety : The patient has smoke detectors in the home. They wear seatbelts.  There are no firearms at home. There is no violence in the home.   There is no risks for hepatitis, STDs or HIV. There is no   history of blood transfusion. They have no travel history to infectious disease endemic areas of the world.  The patient has seen their dentist in the last six month. They have seen their eye doctor in the last year. They admit to slight hearing difficulty with regard to whispered voices and some television programs.  They have deferred  audiologic testing in the last year.  They do not  have excessive sun exposure. Discussed the need for sun protection: hats, long sleeves and use of sunscreen if there is significant sun exposure.   Diet: the importance of a healthy diet is discussed. They do have a healthy diet.  The benefits of regular aerobic exercise were discussed. She walks 4 times per week ,  20 minutes.   Depression screen: there are no signs or vegative symptoms of depression- irritability, change in appetite, anhedonia, sadness/tearfullness.  Cognitive assessment: the patient manages all their financial and personal affairs and is actively engaged. They could relate day,date,year and events; recalled 2/3 objects at 3 minutes; performed clock-face test normally.  The following portions of the patient's history were reviewed and updated as appropriate: allergies, current medications, past family history, past medical history,  past surgical history, past social history  and problem list.  Visual acuity was not assessed per patient preference since she has regular follow up with her ophthalmologist. Hearing and body mass index were assessed and reviewed.   Review of Systems:  Patient denies headache, fevers, malaise, unintentional weight loss, skin rash, eye pain, sinus congestion and sinus pain, sore throat, dysphagia,  hemoptysis , cough, dyspnea, wheezing, chest pain, palpitations, orthopnea, edema, abdominal pain, nausea,  melena, diarrhea, constipation, flank pain, dysuria, hematuria, urinary  Frequency, nocturia, numbness, tingling, seizures,  Focal weakness, Loss of consciousness,  Tremor, insomnia, depression, anxiety, and suicidal ideation.    Objective:  General appearance: alert, cooperative and appears stated age Head: Normocephalic, without obvious abnormality, atraumatic Eyes: conjunctivae/corneas clear. PERRL, EOM's intact. Fundi benign. Ears: normal TM's and external ear canals both ears Nose: Nares  normal. Septum midline. Mucosa normal. No drainage or sinus tenderness. Throat: lips, mucosa, and tongue normal; teeth and gums normal Neck: no adenopathy, no carotid bruit, no JVD, supple, symmetrical, trachea midline and thyroid not enlarged, symmetric, no tenderness/mass/nodules Lungs: clear to auscultation bilaterally Breasts: normal appearance, no masses or tenderness Heart: regular rate and rhythm, S1, S2 normal, no murmur, click, rub or gallop Abdomen: soft, non-tender; bowel sounds normal; no masses,  no organomegaly Extremities: extremities normal, atraumatic, no cyanosis or edema Pulses: 2+ and symmetric Skin: Skin color, texture, turgor normal. No rashes or lesions Neurologic: Alert and oriented X 3, normal strength and tone. Normal symmetric reflexes. Normal coordination and gait.   Assessment and Plan:  Problem List Items Addressed This Visit    Hyperlipidemia LDL goal <130    LDL is at goal with generic Lipitor.  She still has elevated triglycerides.  Addressed with low glycemic index diet. Will repeat in 3 months  Lab Results  Component Value Date   CHOL 175 09/13/2014   HDL 39.30 09/13/2014   LDLCALC 70 08/11/2013   LDLDIRECT 83.0 09/13/2014   TRIG 293.0* 09/13/2014   CHOLHDL 4 09/13/2014   Lab Results  Component Value Date   ALT 20 09/13/2014   AST 17 09/13/2014   ALKPHOS 88 09/13/2014   BILITOT 0.7 09/13/2014           Essential hypertension    Elevated  on current regimen, but patient's home readings have consistently been < 130/80, raising the possibility of white coat hypertension. . Renal function  Is slightly elevated but stable, no changes today.  Bring home machine in for comparison   Lab Results  Component Value Date   CREATININE 1.27* 09/13/2014   Lab Results  Component Value Date   NA 137 09/13/2014   K 4.3 09/13/2014   CL 100 09/13/2014   CO2 28 09/13/2014        Encounter for Medicare annual wellness exam    Annual Medicare  wellness  exam was done as well as a comprehensive physical exam and management of acute and chronic conditions .  During the course of the visit the patient was educated and counseled about appropriate screening and preventive services including : fall prevention , diabetes screening, nutrition counseling, colorectal cancer screening, and recommended immunizations.  Printed recommendations for health maintenance screenings was given.       HTN, white coat - Primary    BP 148/92 mmHg  Pulse 84  Temp(Src) 97.8 F (36.6 C) (Oral)  Resp 14  Ht 5' 4.25" (1.632 m)  Wt 237 lb 8 oz (107.729 kg)  BMI 40.45 kg/m2  SpO2 96%   Patient is taking meds,  Asked to brinmg home BP monitor in for calibration       Gouty arthritis of toe of right foot    Suggested by elevated uric acid level and elevated ESR with normal CBC ,  Rheumatoid.  Rx meloxicam 15 mg daily prn and allopurinol 100 mg  ,  Repeat uric acid level 6 weeks.       Relevant Medications  meloxicam (MOBIC) 15 MG tablet   allopurinol (ZYLOPRIM) 100 MG tablet    Other Visit Diagnoses    Breast cancer screening        Relevant Orders    MM DIGITAL SCREENING BILATERAL    Pain in joint, ankle and foot, unspecified laterality        Relevant Orders    Sedimentation rate (Completed)    Uric acid (Completed)    Rheumatoid factor (Completed)    C-reactive protein (Completed)    CBC with Differential/Platelet (Completed)    Hyperlipidemia        Relevant Orders    Lipid panel (Completed)    Colon cancer screening        Relevant Orders    Ambulatory referral to Gastroenterology        During the course of the visit the patient was educated and counseled about appropriate screening and preventive services including : fall prevention , diabetes screening, nutrition counseling, colorectal cancer screening, and recommended immunizations.

## 2014-09-13 NOTE — Patient Instructions (Addendum)
Please bring your home cuff with you in one week to compare its precision with ours.  This is necessary to diagnosed your elevated blood pressure as "white coat hypertension."  Mammogram and colonoscopy due this year.   If your uric acid level is > 6.0, we will start daily allopurinol Health Maintenance Adopting a healthy lifestyle and getting preventive care can go a long way to promote health and wellness. Talk with your health care provider about what schedule of regular examinations is right for you. This is a good chance for you to check in with your provider about disease prevention and staying healthy. In between checkups, there are plenty of things you can do on your own. Experts have done a lot of research about which l ifestyle changes and preventive measures are most likely to keep you healthy. Ask your health care provider for more information. WEIGHT AND DIET  Eat a healthy diet  Be sure to include plenty of vegetables, fruits, low-fat dairy products, and lean protein.  Do not eat a lot of foods high in solid fats, added sugars, or salt.  Get regular exercise. This is one of the most important things you can do for your health.  Most adults should exercise for at least 150 minutes each week. The exercise should increase your heart rate and make you sweat (moderate-intensity exercise).  Most adults should also do strengthening exercises at least twice a week. This is in addition to the moderate-intensity exercise.  Maintain a healthy weight  Body mass index (BMI) is a measurement that can be used to identify possible weight problems. It estimates body fat based on height and weight. Your health care provider can help determine your BMI and help you achieve or maintain a healthy weight.  For females 68 years of age and older:   A BMI below 18.5 is considered underweight.  A BMI of 18.5 to 24.9 is normal.  A BMI of 25 to 29.9 is considered overweight.  A BMI of 30 and  above is considered obese.  Watch levels of cholesterol and blood lipids  You should start having your blood tested for lipids and cholesterol at 67 years of age, then have this test every 5 years.  You may need to have your cholesterol levels checked more often if:  Your lipid or cholesterol levels are high.  You are older than 67 years of age.  You are at high risk for heart disease.  CANCER SCREENING   Lung Cancer  Lung cancer screening is recommended for adults 73-34 years old who are at high risk for lung cancer because of a history of smoking.  A yearly low-dose CT scan of the lungs is recommended for people who:  Currently smoke.  Have quit within the past 15 years.  Have at least a 30-pack-year history of smoking. A pack year is smoking an average of one pack of cigarettes a day for 1 year.  Yearly screening should continue until it has been 15 years since you quit.  Yearly screening should stop if you develop a health problem that would prevent you from having lung cancer treatment.  Breast Cancer  Practice breast self-awareness. This means understanding how your breasts normally appear and feel.  It also means doing regular breast self-exams. Let your health care provider know about any changes, no matter how small.  If you are in your 20s or 30s, you should have a clinical breast exam (CBE) by a health care provider every  provider every 1-3 years as part of a regular health exam.  If you are 40 or older, have a CBE every year. Also consider having a breast X-ray (mammogram) every year.  If you have a family history of breast cancer, talk to your health care provider about genetic screening.  If you are at high risk for breast cancer, talk to your health care provider about having an MRI and a mammogram every year.  Breast cancer gene (BRCA) assessment is recommended for women who have family members with BRCA-related cancers. BRCA-related cancers  include:  Breast.  Ovarian.  Tubal.  Peritoneal cancers.  Results of the assessment will determine the need for genetic counseling and BRCA1 and BRCA2 testing. Cervical Cancer Routine pelvic examinations to screen for cervical cancer are no longer recommended for nonpregnant women who are considered low risk for cancer of the pelvic organs (ovaries, uterus, and vagina) and who do not have symptoms. A pelvic examination may be necessary if you have symptoms including those associated with pelvic infections. Ask your health care provider if a screening pelvic exam is right for you.   The Pap test is the screening test for cervical cancer for women who are considered at risk.  If you had a hysterectomy for a problem that was not cancer or a condition that could lead to cancer, then you no longer need Pap tests.  If you are older than 65 years, and you have had normal Pap tests for the past 10 years, you no longer need to have Pap tests.  If you have had past treatment for cervical cancer or a condition that could lead to cancer, you need Pap tests and screening for cancer for at least 20 years after your treatment.  If you no longer get a Pap test, assess your risk factors if they change (such as having a new sexual partner). This can affect whether you should start being screened again.  Some women have medical problems that increase their chance of getting cervical cancer. If this is the case for you, your health care provider may recommend more frequent screening and Pap tests.  The human papillomavirus (HPV) test is another test that may be used for cervical cancer screening. The HPV test looks for the virus that can cause cell changes in the cervix. The cells collected during the Pap test can be tested for HPV.  The HPV test can be used to screen women 30 years of age and older. Getting tested for HPV can extend the interval between normal Pap tests from three to five years.  An HPV  test also should be used to screen women of any age who have unclear Pap test results.  After 67 years of age, women should have HPV testing as often as Pap tests.  Colorectal Cancer  This type of cancer can be detected and often prevented.  Routine colorectal cancer screening usually begins at 67 years of age and continues through 67 years of age.  Your health care provider may recommend screening at an earlier age if you have risk factors for colon cancer.  Your health care provider may also recommend using home test kits to check for hidden blood in the stool.  A small camera at the end of a tube can be used to examine your colon directly (sigmoidoscopy or colonoscopy). This is done to check for the earliest forms of colorectal cancer.  Routine screening usually begins at age 50.  Direct examination of the colon   should be repeated every 5-10 years through 67 years of age. However, you may need to be screened more often if early forms of precancerous polyps or small growths are found. Skin Cancer  Check your skin from head to toe regularly.  Tell your health care provider about any new moles or changes in moles, especially if there is a change in a mole's shape or color.  Also tell your health care provider if you have a mole that is larger than the size of a pencil eraser.  Always use sunscreen. Apply sunscreen liberally and repeatedly throughout the day.  Protect yourself by wearing long sleeves, pants, a wide-brimmed hat, and sunglasses whenever you are outside. HEART DISEASE, DIABETES, AND HIGH BLOOD PRESSURE   Have your blood pressure checked at least every 1-2 years. High blood pressure causes heart disease and increases the risk of stroke.  If you are between 55 years and 79 years old, ask your health care provider if you should take aspirin to prevent strokes.  Have regular diabetes screenings. This involves taking a blood sample to check your fasting blood sugar  level.  If you are at a normal weight and have a low risk for diabetes, have this test once every three years after 67 years of age.  If you are overweight and have a high risk for diabetes, consider being tested at a younger age or more often. PREVENTING INFECTION  Hepatitis B  If you have a higher risk for hepatitis B, you should be screened for this virus. You are considered at high risk for hepatitis B if:  You were born in a country where hepatitis B is common. Ask your health care provider which countries are considered high risk.  Your parents were born in a high-risk country, and you have not been immunized against hepatitis B (hepatitis B vaccine).  You have HIV or AIDS.  You use needles to inject street drugs.  You live with someone who has hepatitis B.  You have had sex with someone who has hepatitis B.  You get hemodialysis treatment.  You take certain medicines for conditions, including cancer, organ transplantation, and autoimmune conditions. Hepatitis C  Blood testing is recommended for:  Everyone born from 1945 through 1965.  Anyone with known risk factors for hepatitis C. Sexually transmitted infections (STIs)  You should be screened for sexually transmitted infections (STIs) including gonorrhea and chlamydia if:  You are sexually active and are younger than 67 years of age.  You are older than 67 years of age and your health care provider tells you that you are at risk for this type of infection.  Your sexual activity has changed since you were last screened and you are at an increased risk for chlamydia or gonorrhea. Ask your health care provider if you are at risk.  If you do not have HIV, but are at risk, it may be recommended that you take a prescription medicine daily to prevent HIV infection. This is called pre-exposure prophylaxis (PrEP). You are considered at risk if:  You are sexually active and do not regularly use condoms or know the HIV status  of your partner(s).  You take drugs by injection.  You are sexually active with a partner who has HIV. Talk with your health care provider about whether you are at high risk of being infected with HIV. If you choose to begin PrEP, you should first be tested for HIV. You should then be tested every 3 months for   as long as you are taking PrEP.  PREGNANCY   If you are premenopausal and you may become pregnant, ask your health care provider about preconception counseling.  If you may become pregnant, take 400 to 800 micrograms (mcg) of folic acid every day.  If you want to prevent pregnancy, talk to your health care provider about birth control (contraception). OSTEOPOROSIS AND MENOPAUSE   Osteoporosis is a disease in which the bones lose minerals and strength with aging. This can result in serious bone fractures. Your risk for osteoporosis can be identified using a bone density scan.  If you are 65 years of age or older, or if you are at risk for osteoporosis and fractures, ask your health care provider if you should be screened.  Ask your health care provider whether you should take a calcium or vitamin D supplement to lower your risk for osteoporosis.  Menopause may have certain physical symptoms and risks.  Hormone replacement therapy may reduce some of these symptoms and risks. Talk to your health care provider about whether hormone replacement therapy is right for you.  HOME CARE INSTRUCTIONS   Schedule regular health, dental, and eye exams.  Stay current with your immunizations.   Do not use any tobacco products including cigarettes, chewing tobacco, or electronic cigarettes.  If you are pregnant, do not drink alcohol.  If you are breastfeeding, limit how much and how often you drink alcohol.  Limit alcohol intake to no more than 1 drink per day for nonpregnant women. One drink equals 12 ounces of beer, 5 ounces of wine, or 1 ounces of hard liquor.  Do not use street  drugs.  Do not share needles.  Ask your health care provider for help if you need support or information about quitting drugs.  Tell your health care provider if you often feel depressed.  Tell your health care provider if you have ever been abused or do not feel safe at home. Document Released: 11/12/2010 Document Revised: 09/13/2013 Document Reviewed: 03/31/2013 Exit[employer] Patient Information 2015 [employer]. This information is not intended to replace advice given to you by your health care provider. Make sure you discuss any questions you have with your health care provider.  

## 2014-09-13 NOTE — Telephone Encounter (Signed)
Last OV 4.1.15, left message on VM to return call.  No response.  Please advise refill

## 2014-09-14 LAB — RHEUMATOID FACTOR: RHEUMATOID FACTOR: 13 [IU]/mL (ref <=14)

## 2014-09-14 LAB — SEDIMENTATION RATE: SED RATE: 46 mm/h — AB (ref 0–22)

## 2014-09-15 ENCOUNTER — Encounter: Payer: Self-pay | Admitting: Internal Medicine

## 2014-09-15 DIAGNOSIS — M109 Gout, unspecified: Secondary | ICD-10-CM | POA: Insufficient documentation

## 2014-09-15 MED ORDER — ALLOPURINOL 100 MG PO TABS: 100.0000 mg | ORAL_TABLET | Freq: Every day | ORAL | Status: DC

## 2014-09-15 MED ORDER — MELOXICAM 15 MG PO TABS: 15.0000 mg | ORAL_TABLET | Freq: Every day | ORAL | Status: DC

## 2014-09-15 NOTE — Assessment & Plan Note (Addendum)
LDL is at goal with generic Lipitor.  She still has elevated triglycerides.  Addressed with low glycemic index diet. Will repeat in 3 months  Lab Results  Component Value Date   CHOL 175 09/13/2014   HDL 39.30 09/13/2014   LDLCALC 70 08/11/2013   LDLDIRECT 83.0 09/13/2014   TRIG 293.0* 09/13/2014   CHOLHDL 4 09/13/2014   Lab Results  Component Value Date   ALT 20 09/13/2014   AST 17 09/13/2014   ALKPHOS 88 09/13/2014   BILITOT 0.7 09/13/2014

## 2014-09-15 NOTE — Assessment & Plan Note (Addendum)
Suggested by elevated uric acid level and elevated ESR with normal CBC ,  Rheumatoid.  Rx meloxicam 15 mg daily prn and allopurinol 100 mg  ,  Repeat uric acid level 6 weeks.

## 2014-09-15 NOTE — Telephone Encounter (Signed)
Had OV yesterday,  rx refilled

## 2014-09-15 NOTE — Assessment & Plan Note (Signed)
BP 148/92 mmHg  Pulse 84  Temp(Src) 97.8 F (36.6 C) (Oral)  Resp 14  Ht 5' 4.25" (1.632 m)  Wt 237 lb 8 oz (107.729 kg)  BMI 40.45 kg/m2  SpO2 96%   Patient is taking meds,  Asked to brinmg home BP monitor in for calibration

## 2014-09-15 NOTE — Assessment & Plan Note (Addendum)
Elevated  on current regimen, but patient's home readings have consistently been < 130/80, raising the possibility of white coat hypertension. . Renal function  Is slightly elevated but stable, no changes today.  Bring home machine in for comparison   Lab Results  Component Value Date   CREATININE 1.27* 09/13/2014   Lab Results  Component Value Date   NA 137 09/13/2014   K 4.3 09/13/2014   CL 100 09/13/2014   CO2 28 09/13/2014

## 2014-09-15 NOTE — Assessment & Plan Note (Signed)

## 2014-10-13 ENCOUNTER — Other Ambulatory Visit: Payer: Self-pay | Admitting: Internal Medicine

## 2014-11-09 ENCOUNTER — Ambulatory Visit (AMBULATORY_SURGERY_CENTER): Payer: Self-pay

## 2014-11-09 VITALS — Ht 64.0 in | Wt 236.0 lb

## 2014-11-09 DIAGNOSIS — Z860100 Personal history of colon polyps, unspecified: Secondary | ICD-10-CM

## 2014-11-09 NOTE — Progress Notes (Signed)
No allergies to eggs or soy No diet/weight loss meds No home oxygen No past problems with anesthesia EXCEPT PONV WITH GENERAL ANESTHESIA  Has email Emmi instructions given for colonoscopy

## 2014-11-13 LAB — HM COLONOSCOPY

## 2014-11-23 ENCOUNTER — Ambulatory Visit (AMBULATORY_SURGERY_CENTER): Payer: Medicare Other | Admitting: Internal Medicine

## 2014-11-23 ENCOUNTER — Encounter: Payer: Self-pay | Admitting: Internal Medicine

## 2014-11-23 VITALS — BP 121/65 | HR 63 | Temp 96.2°F | Resp 17 | Ht 64.0 in | Wt 236.0 lb

## 2014-11-23 DIAGNOSIS — Z800 Family history of malignant neoplasm of digestive organs: Secondary | ICD-10-CM | POA: Diagnosis not present

## 2014-11-23 DIAGNOSIS — J45909 Unspecified asthma, uncomplicated: Secondary | ICD-10-CM | POA: Diagnosis not present

## 2014-11-23 DIAGNOSIS — Z86010 Personal history of colonic polyps: Secondary | ICD-10-CM

## 2014-11-23 MED ORDER — SODIUM CHLORIDE 0.9 % IV SOLN: 500.0000 mL | INTRAVENOUS | Status: DC

## 2014-11-23 NOTE — Op Note (Signed)
[place]  [address] [place], [address]   COLONOSCOPY PROCEDURE REPORT  PATIENT: Kari, [name]  MR#: [id number] BIRTHDATE: 06-14-1945 , 44  yrs. old GENDER: female ENDOSCOPIST: Gatha Mayer, MD, [facility] PROCEDURE DATE:  11/23/2014 PROCEDURE:   Colonoscopy, surveillance First Screening Colonoscopy - Avg.  risk and is 50 yrs.  old or older - No.  Prior Negative Screening - Now for repeat screening. N/A  History of Adenoma - Now for follow-up colonoscopy & has been > or = to 3 yrs.  Yes hx of adenoma.  Has been 3 or more years since last colonoscopy.  Polyps removed today? No Recommend repeat exam, <10 yrs? Yes high risk ASA CLASS:   Class II INDICATIONS:Surveillance due to prior colonic neoplasia, FH Colon or Rectal Adenocarcinoma, and PH Colon Adenoma. MEDICATIONS: Propofol 140 mg IV and Monitored anesthesia care  DESCRIPTION OF PROCEDURE:   After the risks benefits and alternatives of the procedure were thoroughly explained, informed consent was obtained.  The digital rectal exam revealed no abnormalities of the rectum.   The LB [id number]-[id number]  endoscope was introduced through the anus and advanced to the cecum, which was identified by both the appendix and ileocecal valve. No adverse events experienced.   The quality of the prep was excellent. (MiraLax was used)  The instrument was then slowly withdrawn as the colon was fully examined. Estimated blood loss is zero unless otherwise noted in this procedure report.      COLON FINDINGS: There was severe diverticulosis noted in the sigmoid colon.   Medium sized lipoma was found in the descending colon. The examination was otherwise normal.  Retroflexed views revealed no abnormalities. The time to cecum = 2.3 Withdrawal time = 7.6 The scope was withdrawn and the procedure completed. COMPLICATIONS: There were no immediate complications.  ENDOSCOPIC IMPRESSION: 1.   Severe diverticulosis was noted in the  sigmoid colon 2.   Medium sized lipoma in the descending colon 3.   The examination was otherwise normal - excellent prep  RECOMMENDATIONS: Repeat Colonoscopy in 5 years - 2021 - she has hx adenomas and FHx CRCA sister (54's)  eSigned:  Gatha Mayer, MD, [facility] 11/23/2014 8:56 AM   cc: The Patient

## 2014-11-23 NOTE — Progress Notes (Signed)
A/ox3, pleased with MAC, report to RN 

## 2014-11-23 NOTE — Patient Instructions (Addendum)
No polyps today! Your next routine colonoscopy should be in 5 years - 2021.  You do have a condition called diverticulosis - common and not usually a problem. Please read the handout provided.  I appreciate the opportunity to care for you. Gatha Mayer, MD, FACG  YOU HAD AN ENDOSCOPIC PROCEDURE TODAY AT [facility]:   Refer to the procedure report that was given to you for any specific questions about what was found during the examination.  If the procedure report does not answer your questions, please call your gastroenterologist to clarify.  If you requested that your care partner not be given the details of your procedure findings, then the procedure report has been included in a sealed envelope for you to review at your convenience later.  YOU SHOULD EXPECT: Some feelings of bloating in the abdomen. Passage of more gas than usual.  Walking can help get rid of the air that was put into your GI tract during the procedure and reduce the bloating. If you had a lower endoscopy (such as a colonoscopy or flexible sigmoidoscopy) you may notice spotting of blood in your stool or on the toilet paper. If you underwent a bowel prep for your procedure, you may not have a normal bowel movement for a few days.  Please Note:  You might notice some irritation and congestion in your nose or some drainage.  This is from the oxygen used during your procedure.  There is no need for concern and it should clear up in a day or so.  SYMPTOMS TO REPORT IMMEDIATELY:   Following lower endoscopy (colonoscopy or flexible sigmoidoscopy):  Excessive amounts of blood in the stool  Significant tenderness or worsening of abdominal pains  Swelling of the abdomen that is new, acute  Fever of 100F or higher  For urgent or emergent issues, a gastroenterologist can be reached at any hour by calling [phone].   DIET: Your first meal following the procedure should be a small meal and then it  is ok to progress to your normal diet. Heavy or fried foods are harder to digest and may make you feel nauseous or bloated.  Likewise, meals heavy in dairy and vegetables can increase bloating.  Drink plenty of fluids but you should avoid alcoholic beverages for 24 hours.  ACTIVITY:  You should plan to take it easy for the rest of today and you should NOT DRIVE or use heavy machinery until tomorrow (because of the sedation medicines used during the test).    FOLLOW UP: Our staff will call the number listed on your records the next business day following your procedure to check on you and address any questions or concerns that you may have regarding the information given to you following your procedure. If we do not reach you, we will leave a message.  However, if you are feeling well and you are not experiencing any problems, there is no need to return our call.  We will assume that you have returned to your regular daily activities without incident.  If any biopsies were taken you will be contacted by phone or by letter within the next 1-3 weeks.  Please call us at [phone] if you have not heard about the biopsies in 3 weeks.    SIGNATURES/CONFIDENTIALITY: You and/or your care partner have signed paperwork which will be entered into your electronic medical record.  These signatures attest to the fact that that the information above on your After  Visit Summary has been reviewed and is understood.  Full responsibility of the confidentiality of this discharge information lies with you and/or your care-partner.  Diverticulosis-handouts given  Repeat colonoscopy in 5 years due to hx of adenomas and family hx of colon ca-2021

## 2014-11-24 ENCOUNTER — Telehealth: Payer: Self-pay | Admitting: *Deleted

## 2014-11-24 NOTE — Telephone Encounter (Signed)
  Follow up Call-  Call back number 11/23/2014  Post procedure Call Back phone  # [phone]  Permission to leave phone message Yes     No answer and answering machine did not pick up to leave message

## 2014-12-13 ENCOUNTER — Other Ambulatory Visit: Payer: Self-pay | Admitting: Internal Medicine

## 2014-12-13 NOTE — Telephone Encounter (Signed)
Last refill 4.6.16, last OV 5.3.16.  Please advise refill

## 2014-12-14 NOTE — Telephone Encounter (Signed)
Ok to refill,  Refill sent  

## 2014-12-27 ENCOUNTER — Encounter: Payer: Self-pay | Admitting: Internal Medicine

## 2015-01-19 ENCOUNTER — Other Ambulatory Visit: Payer: Self-pay | Admitting: *Deleted

## 2015-01-19 DIAGNOSIS — M109 Gout, unspecified: Secondary | ICD-10-CM

## 2015-01-19 MED ORDER — MIRTAZAPINE 45 MG PO TABS: 45.0000 mg | ORAL_TABLET | Freq: Every day | ORAL | Status: DC

## 2015-01-19 MED ORDER — ATORVASTATIN CALCIUM 10 MG PO TABS: 10.0000 mg | ORAL_TABLET | Freq: Every day | ORAL | Status: DC

## 2015-01-19 MED ORDER — ALLOPURINOL 100 MG PO TABS
100.0000 mg | ORAL_TABLET | Freq: Every day | ORAL | Status: DC
Start: 2015-01-19 — End: 2015-08-16

## 2015-01-19 MED ORDER — MONTELUKAST SODIUM 10 MG PO TABS: 10.0000 mg | ORAL_TABLET | Freq: Every day | ORAL | Status: DC

## 2015-01-19 MED ORDER — RAMIPRIL 10 MG PO CAPS: 10.0000 mg | ORAL_CAPSULE | Freq: Every day | ORAL | Status: DC

## 2015-02-13 ENCOUNTER — Other Ambulatory Visit: Payer: Self-pay | Admitting: Internal Medicine

## 2015-02-13 NOTE — Telephone Encounter (Signed)
Received a refill request for Alprazolam. This medication is only on historical med list. Ok to refill this medication?

## 2015-02-14 NOTE — Telephone Encounter (Signed)
Refill denied,  Controlled substance,  Needs appt to resume

## 2015-03-23 ENCOUNTER — Other Ambulatory Visit: Payer: Self-pay

## 2015-03-23 MED ORDER — HYDROCHLOROTHIAZIDE 25 MG PO TABS: 25.0000 mg | ORAL_TABLET | Freq: Every day | ORAL | Status: DC

## 2015-04-21 ENCOUNTER — Other Ambulatory Visit: Payer: Self-pay | Admitting: Internal Medicine

## 2015-04-24 NOTE — Telephone Encounter (Signed)
Please advise 

## 2015-04-25 NOTE — Telephone Encounter (Signed)
Ok to refill,  Refill sent  

## 2015-05-16 ENCOUNTER — Other Ambulatory Visit: Payer: Self-pay | Admitting: Internal Medicine

## 2015-06-18 ENCOUNTER — Other Ambulatory Visit: Payer: Self-pay | Admitting: Internal Medicine

## 2015-07-24 ENCOUNTER — Other Ambulatory Visit: Payer: Self-pay | Admitting: Internal Medicine

## 2015-07-24 NOTE — Telephone Encounter (Signed)
Refused,  Was supposed to return for laBs in June

## 2015-07-24 NOTE — Telephone Encounter (Signed)
Last OV and labs 5/16 ok to fill?

## 2015-07-27 ENCOUNTER — Other Ambulatory Visit: Payer: Self-pay

## 2015-07-27 NOTE — Telephone Encounter (Signed)
Pharmacy called about a refill on HCTZ. Pt has not been seen since 5/16. Pt needs a f/u

## 2015-07-28 NOTE — Telephone Encounter (Signed)
Pt has bee schedule for an appt on 08/14/15 for a f/u. Pt is requesting a refill. Please advise, thanks

## 2015-08-01 MED ORDER — HYDROCHLOROTHIAZIDE 25 MG PO TABS: ORAL_TABLET | ORAL | Status: DC

## 2015-08-01 NOTE — Telephone Encounter (Signed)
refilled 

## 2015-08-14 ENCOUNTER — Ambulatory Visit (INDEPENDENT_AMBULATORY_CARE_PROVIDER_SITE_OTHER): Payer: Medicare Other | Admitting: Internal Medicine

## 2015-08-14 ENCOUNTER — Encounter: Payer: Self-pay | Admitting: Internal Medicine

## 2015-08-14 VITALS — BP 130/78 | HR 88 | Temp 97.7°F | Resp 12 | Ht 64.0 in | Wt 235.8 lb

## 2015-08-14 DIAGNOSIS — M109 Gout, unspecified: Secondary | ICD-10-CM

## 2015-08-14 DIAGNOSIS — M10071 Idiopathic gout, right ankle and foot: Secondary | ICD-10-CM

## 2015-08-14 DIAGNOSIS — E559 Vitamin D deficiency, unspecified: Secondary | ICD-10-CM

## 2015-08-14 DIAGNOSIS — R7303 Prediabetes: Secondary | ICD-10-CM | POA: Diagnosis not present

## 2015-08-14 DIAGNOSIS — Z23 Encounter for immunization: Secondary | ICD-10-CM

## 2015-08-14 DIAGNOSIS — E669 Obesity, unspecified: Secondary | ICD-10-CM

## 2015-08-14 DIAGNOSIS — E785 Hyperlipidemia, unspecified: Secondary | ICD-10-CM | POA: Diagnosis not present

## 2015-08-14 DIAGNOSIS — I10 Essential (primary) hypertension: Secondary | ICD-10-CM | POA: Diagnosis not present

## 2015-08-14 DIAGNOSIS — IMO0001 Reserved for inherently not codable concepts without codable children: Secondary | ICD-10-CM

## 2015-08-14 DIAGNOSIS — R030 Elevated blood-pressure reading, without diagnosis of hypertension: Secondary | ICD-10-CM

## 2015-08-14 MED ORDER — SCOPOLAMINE 1 MG/3DAYS TD PT72: 1.0000 | MEDICATED_PATCH | TRANSDERMAL | Status: DC

## 2015-08-14 NOTE — Progress Notes (Signed)
Pre-visit discussion using our clinic review tool. No additional management support is needed unless otherwise documented below in the visit note.  

## 2015-08-14 NOTE — Progress Notes (Signed)
Subjective:  Patient ID: Kari [name], female    DOB: 04-17-1946  Age: 70 y.o. MRN: [id number]  CC: The primary encounter diagnosis was Vitamin D deficiency. Diagnoses of Hyperlipidemia LDL goal <130, Essential hypertension, Gouty arthritis of toe of right foot, Prediabetes, Need for prophylactic vaccination against Streptococcus pneumoniae (pneumococcus), Obesity, unspecified, and HTN, white coat were also pertinent to this visit.  HPI Kari [name] presents for follow up on chronic conditions, including obesity, gout, hypertension and hyperlipidemia.  She was last  seen May 2016   Obesity .  Having difficulty losing weight due to inability to follow a diet.  Craves sweets,  Can't follow a low carb diet. Diet reviewed.   Not exercising due to multiple issues  No recent gout attacks    Taking her medcations without side effects     Outpatient Prescriptions Prior to Visit  Medication Sig Dispense Refill  . allopurinol (ZYLOPRIM) 100 MG tablet Take 1 tablet (100 mg total) by mouth daily. 90 tablet 1  . aspirin 325 MG tablet Take 325 mg by mouth daily.      Marland Kitchen atorvastatin (LIPITOR) 10 MG tablet Take 1 tablet (10 mg total) by mouth daily. 90 tablet 1  . cetirizine (ZYRTEC) 10 MG tablet Take 10 mg by mouth daily.      . diflorasone (PSORCON) 0.05 % cream Apply 1 application topically 2 (two) times daily. (Patient taking differently: Apply 1 application topically as needed. ) 30 g 5  . diflorasone (PSORCON) 0.05 % ointment Apply topically daily. 30 g 6  . esomeprazole (NEXIUM) 20 MG capsule Take 20 mg by mouth daily at 12 noon.    . hydrochlorothiazide (HYDRODIURIL) 25 MG tablet TAKE 1 TABLET (25 MG TOTAL) BY MOUTH DAILY. 30 tablet 0  . mirtazapine (REMERON) 45 MG tablet TAKE 1 TABLET (45 MG TOTAL) BY MOUTH AT BEDTIME. 90 tablet 0  . montelukast (SINGULAIR) 10 MG tablet Take 1 tablet (10 mg total) by mouth at bedtime. 90 tablet 1  . OVER THE COUNTER MEDICATION Nasocort nasal spray    .  Probiotic Product (PROBIOTIC PO) Take by mouth.    . ramipril (ALTACE) 10 MG capsule TAKE 1 CAPSULE (10 MG TOTAL) BY MOUTH DAILY. 90 capsule 0  . triamcinolone lotion (KENALOG) 0.1 % APPLY TO AFFECTED AREA TWICE A DAY 60 mL 0   No facility-administered medications prior to visit.    Review of Systems;  Patient denies headache, fevers, malaise, unintentional weight loss, skin rash, eye pain, sinus congestion and sinus pain, sore throat, dysphagia,  hemoptysis , cough, dyspnea, wheezing, chest pain, palpitations, orthopnea, edema, abdominal pain, nausea, melena, diarrhea, constipation, flank pain, dysuria, hematuria, urinary  Frequency, nocturia, numbness, tingling, seizures,  Focal weakness, Loss of consciousness,  Tremor, insomnia, depression, anxiety, and suicidal ideation.      Objective:  BP 130/78 mmHg  Pulse 88  Temp(Src) 97.7 F (36.5 C) (Oral)  Resp 12  Ht 5\' 4"  (1.626 m)  Wt 235 lb 12 oz (106.935 kg)  BMI 40.45 kg/m2  SpO2 98%  BP Readings from Last 3 Encounters:  08/14/15 130/78  11/23/14 121/65  09/13/14 148/92    Wt Readings from Last 3 Encounters:  08/14/15 235 lb 12 oz (106.935 kg)  11/23/14 236 lb (107.049 kg)  11/09/14 236 lb (107.049 kg)    General appearance: alert, cooperative and appears stated age Neck: no adenopathy, no carotid bruit, supple, symmetrical, trachea midline and thyroid not enlarged, symmetric, no tenderness/mass/nodules Back: symmetric, no curvature.  ROM normal. No CVA tenderness. Lungs: clear to auscultation bilaterally Heart: regular rate and rhythm, S1, S2 normal, no murmur, click, rub or gallop Abdomen: central adiposity, abdomen soft, non-tender; bowel sounds normal; no masses,  no organomegaly Pulses: 2+ and symmetric Skin: Skin color, texture, turgor normal. No rashes or lesions Lymph nodes: Cervical, supraclavicular, and axillary nodes normal.  Lab Results  Component Value Date   HGBA1C 5.9 08/14/2015   HGBA1C 5.7 03/03/2013     HGBA1C 5.3 07/29/2012    Lab Results  Component Value Date   CREATININE 1.19 08/14/2015   CREATININE 1.27* 09/13/2014   CREATININE 1.2 08/11/2013    Lab Results  Component Value Date   WBC 7.2 09/13/2014   HGB 12.5 09/13/2014   HCT 36.1 09/13/2014   PLT 242.0 09/13/2014   GLUCOSE 95 08/14/2015   CHOL 175 08/14/2015   TRIG * 08/14/2015    406.0 Triglyceride is over 400; calculations on Lipids are invalid.   HDL 38.40* 08/14/2015   LDLDIRECT 77.0 08/14/2015   LDLCALC 70 08/11/2013   ALT 19 08/14/2015   AST 19 08/14/2015   NA 134* 08/14/2015   K 4.0 08/14/2015   CL 97 08/14/2015   CREATININE 1.19 08/14/2015   BUN 26* 08/14/2015   CO2 30 08/14/2015   TSH 0.74 08/11/2013   HGBA1C 5.9 08/14/2015   MICROALBUR <0.7 08/14/2015    No results found.  Assessment & Plan:   Problem List Items Addressed This Visit    Hyperlipidemia LDL goal <130    LDL is at goal with generic Lipitor.  She still has elevated triglycerides. Will address with low glycemic index diet. Will repeat in 3 months  Lab Results  Component Value Date   CHOL 175 08/14/2015   HDL 38.40* 08/14/2015   LDLCALC 70 08/11/2013   LDLDIRECT 77.0 08/14/2015   TRIG * 08/14/2015    406.0 Triglyceride is over 400; calculations on Lipids are invalid.   CHOLHDL 5 08/14/2015   Lab Results  Component Value Date   ALT 19 08/14/2015   AST 19 08/14/2015   ALKPHOS 95 08/14/2015   BILITOT 0.5 08/14/2015             Relevant Orders   Lipid panel (Completed)   Essential hypertension    Well controlled on current regimen. Renal function stable, no changes today.  Lab Results  Component Value Date   CREATININE 1.19 08/14/2015   Lab Results  Component Value Date   NA 134* 08/14/2015   K 4.0 08/14/2015   CL 97 08/14/2015   CO2 30 08/14/2015         Relevant Orders   Comprehensive metabolic panel (Completed)   LDL cholesterol, direct (Completed)   Microalbumin / creatinine urine ratio (Completed)    Obesity, unspecified    I have addressed  BMI and recommended a low glycemic index diet utilizing smaller more frequent meals to increase metabolism.  I have also recommended that patient start exercising with a goal of 30 minutes of aerobic exercise a minimum of 5 days per week. Screening for lipid disorders, thyroid and diabetes to be done today.        HTN, white coat    BP 130/78 mmHg  Pulse 88  Temp(Src) 97.7 F (36.5 C) (Oral)  Resp 12  Ht 5\' 4"  (1.626 m)  Wt 235 lb 12 oz (106.935 kg)  BMI 40.45 kg/m2  SpO2 98%   Patient is taking meds,  Asked to brinmg home BP monitor  in for calibration         Gouty arthritis of toe of right foot    No attacks since last OV , since starting meloxicam 15 mg daily prn and allopurinol 100 mg  ,Howevere, uric acid level needs lowering.  Will increased allopurinol dose to 150 mg daily   Lab Results  Component Value Date   LABURIC 8.6* 08/14/2015   Lab Results  Component Value Date   CREATININE 1.19 08/14/2015         Relevant Orders   Uric acid (Completed)   Vitamin D deficiency - Primary   Relevant Orders   VITAMIN D 25 Hydroxy (Vit-D Deficiency, Fractures) (Completed)    Other Visit Diagnoses    Prediabetes        Relevant Orders    Hemoglobin A1c (Completed)    Need for prophylactic vaccination against Streptococcus pneumoniae (pneumococcus)        Relevant Orders    Pneumococcal polysaccharide vaccine 23-valent greater than or equal to 2yo subcutaneous/IM (Completed)      A total of 25 minutes of face to face time was spent with patient more than half of which was spent in counselling about the above mentioned conditions  and coordination of care   I am having Ms. Rodelo start on scopolamine. I am also having her maintain her cetirizine, aspirin, diflorasone, diflorasone, OVER THE COUNTER MEDICATION, esomeprazole, Probiotic Product (PROBIOTIC PO), atorvastatin, allopurinol, montelukast, triamcinolone lotion, mirtazapine,  ramipril, and hydrochlorothiazide.  Meds ordered this encounter  Medications  . scopolamine (TRANSDERM-SCOP, 1.5 MG,) 1 MG/3DAYS    Sig: Place 1 patch (1.5 mg total) onto the skin every 3 (three) days.    Dispense:  10 patch    Refill:  12    There are no discontinued medications.  Follow-up: No Follow-up on file.   [name], MD

## 2015-08-14 NOTE — Patient Instructions (Addendum)
Please start exercising for 15 minutes a few times per week and work you way up to 30 mintues 5 days per week  Here are a few more tips on eliminating sugar from your diet:      Premier Protein shakes 30 g protein 160 cal 1 g sugar   Danton Clap now makes a frozen breakfast frittata that can be microwaved in 2 minutes and is very low carb. Frittats are similar to quiches without the crust  Try [place]'s  Pasta.  Made from semolina and less processed so IT  does not raise blood sugars   DO NOT BREAD OR DREDGE YOUR MEAT.     To make a low carb chip :  Take the Joseph's Lavash or Pita bread,  Or the [place] Low carb whole wheat tortilla   Place on metal cookie sheet  Brush with olive oil  Sprinkle garlic powder (NOT garlic salt), grated parmesan cheese, mediterranean seasoning , or all of them?  Bake at 225 or 250 for 90 minutes   We have substitutions for your potatoes!!  Try the mashed cauliflower and riced cauliflower dishes instead of rice and mashed potatoes  Mashed turnips are also very low carb!   For dessert:  Try the Dannon Lt n Fit greek yogurt dessert flavors and top with reddi Whip .  8 carbs,  80 calories  Try Oikos Triple Zero [place] Yogurt in the salted caramel, and the coffee flavors  With Whipped Cream for dessert

## 2015-08-15 LAB — COMPREHENSIVE METABOLIC PANEL
ALT: 19 U/L (ref 0–35)
AST: 19 U/L (ref 0–37)
Albumin: 4.1 g/dL (ref 3.5–5.2)
Alkaline Phosphatase: 95 U/L (ref 39–117)
BUN: 26 mg/dL — ABNORMAL HIGH (ref 6–23)
CHLORIDE: 97 meq/L (ref 96–112)
CO2: 30 meq/L (ref 19–32)
Calcium: 9.9 mg/dL (ref 8.4–10.5)
Creatinine, Ser: 1.19 mg/dL (ref 0.40–1.20)
GFR: 47.95 mL/min — AB (ref >60.00)
GLUCOSE: 95 mg/dL (ref 70–99)
POTASSIUM: 4 meq/L (ref 3.5–5.1)
Sodium: 134 mEq/L — ABNORMAL LOW (ref 135–145)
Total Bilirubin: 0.5 mg/dL (ref 0.2–1.2)
Total Protein: 7.3 g/dL (ref 6.0–8.3)

## 2015-08-15 LAB — LDL CHOLESTEROL, DIRECT: Direct LDL: 77 mg/dL

## 2015-08-15 LAB — HEMOGLOBIN A1C: Hgb A1c MFr Bld: 5.9 % (ref 4.6–6.5)

## 2015-08-15 LAB — MICROALBUMIN / CREATININE URINE RATIO
Creatinine,U: 46.4 mg/dL
Microalb Creat Ratio: 1.5 mg/g (ref 0.0–30.0)

## 2015-08-15 LAB — LIPID PANEL
CHOL/HDL RATIO: 5
CHOLESTEROL: 175 mg/dL (ref 0–200)
HDL: 38.4 mg/dL — ABNORMAL LOW (ref >39.00)

## 2015-08-15 LAB — URIC ACID: Uric Acid, Serum: 8.6 mg/dL — ABNORMAL HIGH (ref 2.4–7.0)

## 2015-08-15 LAB — VITAMIN D 25 HYDROXY (VIT D DEFICIENCY, FRACTURES): VITD: 32.67 ng/mL (ref 30.00–100.00)

## 2015-08-15 NOTE — Assessment & Plan Note (Signed)
LDL is at goal with generic Lipitor.  She still has elevated triglycerides. Will address with low glycemic index diet. Will repeat in 3 months  Lab Results  Component Value Date   CHOL 175 08/14/2015   HDL 38.40* 08/14/2015   LDLCALC 70 08/11/2013   LDLDIRECT 77.0 08/14/2015   TRIG * 08/14/2015    406.0 Triglyceride is over 400; calculations on Lipids are invalid.   CHOLHDL 5 08/14/2015   Lab Results  Component Value Date   ALT 19 08/14/2015   AST 19 08/14/2015   ALKPHOS 95 08/14/2015   BILITOT 0.5 08/14/2015

## 2015-08-15 NOTE — Assessment & Plan Note (Signed)
Well controlled on current regimen. Renal function stable, no changes today.  Lab Results  Component Value Date   CREATININE 1.19 08/14/2015   Lab Results  Component Value Date   NA 134* 08/14/2015   K 4.0 08/14/2015   CL 97 08/14/2015   CO2 30 08/14/2015

## 2015-08-15 NOTE — Assessment & Plan Note (Signed)
I have addressed  BMI and recommended a low glycemic index diet utilizing smaller more frequent meals to increase metabolism.  I have also recommended that patient start exercising with a goal of 30 minutes of aerobic exercise a minimum of 5 days per week. Screening for lipid disorders, thyroid and diabetes to be done today.   

## 2015-08-15 NOTE — Assessment & Plan Note (Signed)
No attacks since last OV , since starting meloxicam 15 mg daily prn and allopurinol 100 mg  ,Howevere, uric acid level needs lowering.  Will increased allopurinol dose to 150 mg daily   Lab Results  Component Value Date   LABURIC 8.6* 08/14/2015   Lab Results  Component Value Date   CREATININE 1.19 08/14/2015

## 2015-08-15 NOTE — Assessment & Plan Note (Signed)
BP 130/78 mmHg  Pulse 88  Temp(Src) 97.7 F (36.5 C) (Oral)  Resp 12  Ht 5\' 4"  (1.626 m)  Wt 235 lb 12 oz (106.935 kg)  BMI 40.45 kg/m2  SpO2 98%   Patient is taking meds,  Asked to brinmg home BP monitor in for calibration

## 2015-08-16 ENCOUNTER — Encounter: Payer: Self-pay | Admitting: *Deleted

## 2015-08-16 ENCOUNTER — Other Ambulatory Visit: Payer: Self-pay | Admitting: Internal Medicine

## 2015-08-16 DIAGNOSIS — H5212 Myopia, left eye: Secondary | ICD-10-CM | POA: Diagnosis not present

## 2015-08-16 DIAGNOSIS — H2513 Age-related nuclear cataract, bilateral: Secondary | ICD-10-CM | POA: Diagnosis not present

## 2015-08-16 DIAGNOSIS — H52223 Regular astigmatism, bilateral: Secondary | ICD-10-CM | POA: Diagnosis not present

## 2015-08-16 DIAGNOSIS — M109 Gout, unspecified: Secondary | ICD-10-CM

## 2015-08-16 DIAGNOSIS — I10 Essential (primary) hypertension: Secondary | ICD-10-CM | POA: Diagnosis not present

## 2015-08-16 DIAGNOSIS — H524 Presbyopia: Secondary | ICD-10-CM | POA: Diagnosis not present

## 2015-08-16 DIAGNOSIS — D3132 Benign neoplasm of left choroid: Secondary | ICD-10-CM | POA: Diagnosis not present

## 2015-08-16 MED ORDER — ALLOPURINOL 300 MG PO TABS: 150.0000 mg | ORAL_TABLET | Freq: Every day | ORAL | Status: DC

## 2015-08-21 ENCOUNTER — Telehealth: Payer: Self-pay

## 2015-08-21 DIAGNOSIS — M109 Gout, unspecified: Secondary | ICD-10-CM

## 2015-08-21 MED ORDER — MONTELUKAST SODIUM 10 MG PO TABS: 10.0000 mg | ORAL_TABLET | Freq: Every day | ORAL | Status: DC

## 2015-08-21 MED ORDER — HYDROCHLOROTHIAZIDE 25 MG PO TABS: ORAL_TABLET | ORAL | Status: DC

## 2015-08-21 MED ORDER — RAMIPRIL 10 MG PO CAPS: ORAL_CAPSULE | ORAL | Status: DC

## 2015-08-21 MED ORDER — ATORVASTATIN CALCIUM 10 MG PO TABS: 10.0000 mg | ORAL_TABLET | Freq: Every day | ORAL | Status: DC

## 2015-08-21 MED ORDER — MIRTAZAPINE 45 MG PO TABS: ORAL_TABLET | ORAL | Status: DC

## 2015-08-21 MED ORDER — ALLOPURINOL 300 MG PO TABS: 150.0000 mg | ORAL_TABLET | Freq: Every day | ORAL | Status: DC

## 2015-08-24 MED ORDER — HYDROCHLOROTHIAZIDE 25 MG PO TABS: ORAL_TABLET | ORAL | Status: DC

## 2015-08-24 MED ORDER — RAMIPRIL 10 MG PO CAPS: ORAL_CAPSULE | ORAL | Status: DC

## 2015-08-24 MED ORDER — MIRTAZAPINE 45 MG PO TABS: ORAL_TABLET | ORAL | Status: DC

## 2015-08-24 MED ORDER — ALLOPURINOL 300 MG PO TABS: 150.0000 mg | ORAL_TABLET | Freq: Every day | ORAL | Status: DC

## 2015-08-24 MED ORDER — MONTELUKAST SODIUM 10 MG PO TABS: 10.0000 mg | ORAL_TABLET | Freq: Every day | ORAL | Status: DC

## 2015-08-24 MED ORDER — ATORVASTATIN CALCIUM 10 MG PO TABS: 10.0000 mg | ORAL_TABLET | Freq: Every day | ORAL | Status: DC

## 2015-08-24 NOTE — Telephone Encounter (Signed)
Pt called to follow up on her medication and has ran out. Pharmacy is [facility], [place]. Call pt @ [phone]. Thank you!

## 2015-08-24 NOTE — Telephone Encounter (Signed)
Resent to [facility]. Thanks

## 2015-08-24 NOTE — Addendum Note (Signed)
Addended by: Bevelyn Ngo on: 08/24/2015 04:49 PM   Modules accepted: Orders

## 2015-08-30 ENCOUNTER — Telehealth: Payer: Self-pay | Admitting: Internal Medicine

## 2015-08-30 MED ORDER — TRAMADOL HCL 50 MG PO TABS: 50.0000 mg | ORAL_TABLET | Freq: Three times a day (TID) | ORAL | Status: DC | PRN

## 2015-08-30 MED ORDER — COLCHICINE 0.6 MG PO TABS: 0.6000 mg | ORAL_TABLET | Freq: Two times a day (BID) | ORAL | Status: DC

## 2015-08-30 NOTE — Telephone Encounter (Signed)
Sending colchicine and tramadol to take for gout glare.. Suspend motrin and atorvastatin while taking the colchicine.

## 2015-08-30 NOTE — Telephone Encounter (Signed)
I spoke with Mrs. Kari [name].  There was a clerical issue with her prescriptions at the beginning of this month and they were sent to the wrong pharmacy.  The error was fixed but while it was getting fixed the patient went with out her allopurinol.  Over this past weekend, she had a gout flare up, she has swelling in both her feet.  The right foot seems to be worse then the left.  She wanted to know if you would prescribe something to help with the pain  (this had happened last year and you gave her meloxicam) or should she just take Ibuprofen? thanks

## 2015-08-30 NOTE — Telephone Encounter (Signed)
Pt only wants to use [facility]

## 2015-08-31 NOTE — Telephone Encounter (Signed)
Called and notified patient of medication MD called to pharmacy.

## 2015-09-22 ENCOUNTER — Encounter: Payer: Self-pay | Admitting: Family Medicine

## 2015-09-22 ENCOUNTER — Ambulatory Visit: Payer: Medicare Other | Admitting: Internal Medicine

## 2015-09-22 ENCOUNTER — Ambulatory Visit (INDEPENDENT_AMBULATORY_CARE_PROVIDER_SITE_OTHER): Payer: Medicare Other | Admitting: Family Medicine

## 2015-09-22 VITALS — BP 140/68 | HR 80 | Temp 98.4°F | Ht 64.0 in | Wt 236.4 lb

## 2015-09-22 DIAGNOSIS — J069 Acute upper respiratory infection, unspecified: Secondary | ICD-10-CM | POA: Insufficient documentation

## 2015-09-22 NOTE — Patient Instructions (Signed)
Nice to meet you. Your symptoms are likely related to a virus or allergies. You can continue Delsym DM. You can also use Flonase and Claritin. If you do not improve by early next week please follow-up with [place]. If you develop trouble breathing, cough productive of blood, or fevers please seek medical attention.

## 2015-09-22 NOTE — Progress Notes (Signed)
Pre visit review using our clinic review tool, if applicable. No additional management support is needed unless otherwise documented below in the visit note. 

## 2015-09-22 NOTE — Assessment & Plan Note (Signed)
Patient's symptoms most consistent with viral upper respiratory infection. She will continue to monitor. Continue Delsym DM as needed. Discussed antihistamine and nasal steroid use. Given return precautions.

## 2015-09-22 NOTE — Progress Notes (Signed)
Patient ID: Kari [name], female   DOB: [birth year], 70 y.o.   MRN: [id number]  Tommi Rumps, MD Phone: [phone]  Kari [name] is a 70 y.o. female who presents today for same-day visit.  Patient notes onset of symptoms Monday of this week. Started with cough and sore throat. Postnasal drip. Then developed sinus congestion. Mild green mucus with cough though not blowing anything out of her nose. No fevers. No ear pain. No shortness of breath. She notes today she feels better than she did yesterday. Taking Delsym DM for this.  PMH: Former smoker   ROS see history of present illness  Objective  Physical Exam Filed Vitals:   09/22/15 1119  BP: 140/68  Pulse: 80  Temp: 98.4 F (36.9 C)    BP Readings from Last 3 Encounters:  09/22/15 140/68  08/14/15 130/78  11/23/14 121/65   Wt Readings from Last 3 Encounters:  09/22/15 236 lb 6.4 oz (107.23 kg)  08/14/15 235 lb 12 oz (106.935 kg)  11/23/14 236 lb (107.049 kg)    Physical Exam  Constitutional: She is well-developed, well-nourished, and in no distress.  HENT:  Head: Normocephalic and atraumatic.  Right Ear: External ear normal.  Left Ear: External ear normal.  Mouth/Throat: Oropharynx is clear and moist. No oropharyngeal exudate.  Normal TMs bilaterally  Eyes: Conjunctivae are normal. Pupils are equal, round, and reactive to light.  Neck: Neck supple.  Cardiovascular: Normal rate, regular rhythm and normal heart sounds.   Pulmonary/Chest: Effort normal and breath sounds normal.  Neurological: She is alert. Gait normal.  Skin: Skin is warm and dry. She is not diaphoretic.     Assessment/Plan: Please see individual problem list.  Viral upper respiratory infection Patient's symptoms most consistent with viral upper respiratory infection. She will continue to monitor. Continue Delsym DM as needed. Discussed antihistamine and nasal steroid use. Given return precautions.   Tommi Rumps, MD [place]

## 2015-12-27 ENCOUNTER — Other Ambulatory Visit: Payer: Self-pay | Admitting: Internal Medicine

## 2016-01-31 ENCOUNTER — Other Ambulatory Visit: Payer: Self-pay | Admitting: Internal Medicine

## 2016-02-02 ENCOUNTER — Other Ambulatory Visit: Payer: Self-pay | Admitting: *Deleted

## 2016-02-02 MED ORDER — TRIAMCINOLONE ACETONIDE 0.1 % EX LOTN: TOPICAL_LOTION | CUTANEOUS | 2 refills | Status: DC

## 2016-02-02 NOTE — Telephone Encounter (Signed)
Received faxed refill request

## 2016-03-14 ENCOUNTER — Telehealth: Payer: Self-pay | Admitting: Internal Medicine

## 2016-03-14 NOTE — Telephone Encounter (Signed)
I called pt and left a vm to call office to sch AWV. Thank you! °

## 2016-04-24 ENCOUNTER — Ambulatory Visit: Payer: Medicare Other

## 2016-05-30 ENCOUNTER — Ambulatory Visit: Payer: Medicare Other | Admitting: Internal Medicine

## 2016-06-14 ENCOUNTER — Telehealth: Payer: Self-pay | Admitting: Internal Medicine

## 2016-06-14 NOTE — Telephone Encounter (Signed)
I called pt and left a vm to sch AWV. Thank you!

## 2016-07-01 ENCOUNTER — Telehealth: Payer: Self-pay | Admitting: Internal Medicine

## 2016-07-01 NOTE — Telephone Encounter (Signed)
I called and left a vm to call the office to sch a AWV. Thank you!

## 2016-07-02 ENCOUNTER — Other Ambulatory Visit: Payer: Self-pay | Admitting: Internal Medicine

## 2016-07-02 NOTE — Telephone Encounter (Signed)
LOV: 08/14/15 Next OV: 07/04/16 Mirtazapine 45 mg last written 12/27/15 # 90 w/ 1 rf.  Ok to Rf?

## 2016-07-03 NOTE — Telephone Encounter (Signed)
Please notify patient that the prescription  was Refilled for 90 days only;  it has been 10   months since last visit. Marland Kitchen  OFFICE VISIT NEEDED prior to any more refills

## 2016-07-04 ENCOUNTER — Encounter: Payer: Self-pay | Admitting: Internal Medicine

## 2016-07-04 ENCOUNTER — Ambulatory Visit (INDEPENDENT_AMBULATORY_CARE_PROVIDER_SITE_OTHER): Payer: Medicare Other | Admitting: Internal Medicine

## 2016-07-04 VITALS — BP 118/80 | HR 79 | Resp 16 | Wt 226.0 lb

## 2016-07-04 DIAGNOSIS — E7800 Pure hypercholesterolemia, unspecified: Secondary | ICD-10-CM

## 2016-07-04 DIAGNOSIS — R5383 Other fatigue: Secondary | ICD-10-CM | POA: Diagnosis not present

## 2016-07-04 DIAGNOSIS — Z1231 Encounter for screening mammogram for malignant neoplasm of breast: Secondary | ICD-10-CM | POA: Diagnosis not present

## 2016-07-04 DIAGNOSIS — M1A0790 Idiopathic chronic gout, unspecified ankle and foot, without tophus (tophi): Secondary | ICD-10-CM

## 2016-07-04 DIAGNOSIS — I10 Essential (primary) hypertension: Secondary | ICD-10-CM

## 2016-07-04 DIAGNOSIS — Z1239 Encounter for other screening for malignant neoplasm of breast: Secondary | ICD-10-CM

## 2016-07-04 DIAGNOSIS — R7301 Impaired fasting glucose: Secondary | ICD-10-CM | POA: Diagnosis not present

## 2016-07-04 DIAGNOSIS — M109 Gout, unspecified: Secondary | ICD-10-CM | POA: Diagnosis not present

## 2016-07-04 DIAGNOSIS — E785 Hyperlipidemia, unspecified: Secondary | ICD-10-CM

## 2016-07-04 LAB — COMPREHENSIVE METABOLIC PANEL
ALBUMIN: 4.3 g/dL (ref 3.5–5.2)
ALT: 20 U/L (ref 0–35)
AST: 19 U/L (ref 0–37)
Alkaline Phosphatase: 104 U/L (ref 39–117)
BILIRUBIN TOTAL: 0.7 mg/dL (ref 0.2–1.2)
BUN: 18 mg/dL (ref 6–23)
CALCIUM: 9.7 mg/dL (ref 8.4–10.5)
CO2: 26 mEq/L (ref 19–32)
CREATININE: 1.2 mg/dL (ref 0.40–1.20)
Chloride: 96 mEq/L (ref 96–112)
GFR: 47.37 mL/min — ABNORMAL LOW (ref >60.00)
Glucose, Bld: 119 mg/dL — ABNORMAL HIGH (ref 70–99)
Potassium: 4.5 mEq/L (ref 3.5–5.1)
Sodium: 133 mEq/L — ABNORMAL LOW (ref 135–145)
TOTAL PROTEIN: 6.8 g/dL (ref 6.0–8.3)

## 2016-07-04 LAB — MICROALBUMIN / CREATININE URINE RATIO
CREATININE, U: 74 mg/dL
Microalb Creat Ratio: 0.9 mg/g (ref 0.0–30.0)
Microalb, Ur: <0.7 mg/dL (ref 0.0–1.9)

## 2016-07-04 LAB — HEMOGLOBIN A1C: HEMOGLOBIN A1C: 5.7 % (ref 4.6–6.5)

## 2016-07-04 LAB — LIPID PANEL
Cholesterol: 174 mg/dL (ref 0–200)
HDL: 41.9 mg/dL (ref >39.00)
NONHDL: 132.31
Total CHOL/HDL Ratio: 4
Triglycerides: 287 mg/dL — ABNORMAL HIGH (ref 0.0–149.0)
VLDL: 57.4 mg/dL — ABNORMAL HIGH (ref 0.0–40.0)

## 2016-07-04 LAB — LDL CHOLESTEROL, DIRECT: LDL DIRECT: 85 mg/dL

## 2016-07-04 LAB — URIC ACID: URIC ACID, SERUM: 7.2 mg/dL — AB (ref 2.4–7.0)

## 2016-07-04 NOTE — Progress Notes (Signed)
Subjective:  Patient ID: Kari [name], female    DOB: 10/24/1945  Age: 71 y.o. MRN: [id number]  CC: The primary encounter diagnosis was Breast cancer screening. Diagnoses of Fatigue, unspecified type, Impaired fasting glucose, Pure hypercholesterolemia, Essential hypertension, Idiopathic chronic gout of foot without tophus, unspecified laterality, Gouty arthritis of toe of right foot, and Hyperlipidemia LDL goal <130 were also pertinent to this visit.  HPI Kari [name] presents for FOLLOW UP ON multiple issues.  LAST SEEN April 2017  GOUT: she has had  2 flareups recently last one October 2017  Still .taking allopurinol.  took colchicine and had diarrhea but flare resolved after 2 days; previous one was longer in duration .     Previous labs reviewed: A1C 5.9 TRIGS OVER 400 HDL LOW   LOST 10 LBS  RECENTLY BMI 38  ON A LIQUID DIET DURING ORAL SURGERY  FOR BRIDGE/IMPLANTS   Outpatient Medications Prior to Visit  Medication Sig Dispense Refill  . aspirin 325 MG tablet Take 325 mg by mouth daily.      Marland Kitchen atorvastatin (LIPITOR) 10 MG tablet Take 1 tablet by mouth  daily 90 tablet 1  . cetirizine (ZYRTEC) 10 MG tablet Take 10 mg by mouth daily.      . colchicine 0.6 MG tablet Take 1 tablet (0.6 mg total) by mouth 2 (two) times daily. As needed for gout flare. 60 tablet 2  . diflorasone (PSORCON) 0.05 % cream Apply 1 application topically 2 (two) times daily. (Patient taking differently: Apply 1 application topically as needed. ) 30 g 5  . diflorasone (PSORCON) 0.05 % ointment Apply topically daily. 30 g 6  . esomeprazole (NEXIUM) 20 MG capsule Take 20 mg by mouth daily at 12 noon.    . hydrochlorothiazide (HYDRODIURIL) 25 MG tablet TAKE 1 TABLET BY MOUTH  DAILY 90 tablet 0  . mirtazapine (REMERON) 45 MG tablet TAKE 1 TABLET BY MOUTH AT  BEDTIME 90 tablet 0  . montelukast (SINGULAIR) 10 MG tablet Take 1 tablet by mouth at  bedtime 90 tablet 1  . OVER THE COUNTER MEDICATION Nasocort nasal  spray    . Probiotic Product (PROBIOTIC PO) Take by mouth.    . ramipril (ALTACE) 10 MG capsule Take 1 capsule by mouth  daily 90 capsule 1  . scopolamine (TRANSDERM-SCOP, 1.5 MG,) 1 MG/3DAYS Place 1 patch (1.5 mg total) onto the skin every 3 (three) days. 10 patch 12  . traMADol (ULTRAM) 50 MG tablet Take 1 tablet (50 mg total) by mouth every 8 (eight) hours as needed. 90 tablet 1  . triamcinolone lotion (KENALOG) 0.1 % APPLY TO AFFECTED AREA TWICE A DAY 60 mL 2  . allopurinol (ZYLOPRIM) 300 MG tablet Take 0.5 tablets (150 mg total) by mouth daily. 90 tablet 1   No facility-administered medications prior to visit.     Review of Systems;  Patient denies headache, fevers, malaise, unintentional weight loss, skin rash, eye pain, sinus congestion and sinus pain, sore throat, dysphagia,  hemoptysis , cough, dyspnea, wheezing, chest pain, palpitations, orthopnea, edema, abdominal pain, nausea, melena, diarrhea, constipation, flank pain, dysuria, hematuria, urinary  Frequency, nocturia, numbness, tingling, seizures,  Focal weakness, Loss of consciousness,  Tremor, insomnia, depression, anxiety, and suicidal ideation.      Objective:  BP 118/80   Pulse 79   Resp 16   Wt 226 lb (102.5 kg)   SpO2 96%   BMI 38.79 kg/m   BP Readings from Last 3 Encounters:  07/04/16 118/80  09/22/15 140/68  08/14/15 130/78    Wt Readings from Last 3 Encounters:  07/04/16 226 lb (102.5 kg)  09/22/15 236 lb 6.4 oz (107.2 kg)  08/14/15 235 lb 12 oz (106.9 kg)   General appearance: alert, cooperative and appears stated age Head: Normocephalic, without obvious abnormality, atraumatic Eyes: conjunctivae/corneas clear. PERRL, EOM's intact. Fundi benign. Ears: normal TM's and external ear canals both ears Nose: Nares normal. Septum midline. Mucosa normal. No drainage or sinus tenderness. Throat: lips, mucosa, and tongue normal; teeth and gums normal Neck: no adenopathy, no carotid bruit, no JVD, supple,  symmetrical, trachea midline and thyroid not enlarged, symmetric, no tenderness/mass/nodules Lungs: clear to auscultation bilaterally Breasts: normal appearance, no masses or tenderness Heart: regular rate and rhythm, S1, S2 normal, no murmur, click, rub or gallop Abdomen: soft, non-tender; bowel sounds normal; no masses,  no organomegaly Extremities: extremities normal, atraumatic, no cyanosis or edema Pulses: 2+ and symmetric Skin: Skin color, texture, turgor normal. No rashes or lesions Neurologic: Alert and oriented X 3, normal strength and tone. Normal symmetric reflexes. Normal coordination and gait.    Lab Results  Component Value Date   HGBA1C 5.7 07/04/2016   HGBA1C 5.9 08/14/2015   HGBA1C 5.7 03/03/2013    Lab Results  Component Value Date   CREATININE 1.20 07/04/2016   CREATININE 1.19 08/14/2015   CREATININE 1.27 (H) 09/13/2014    Lab Results  Component Value Date   WBC 7.2 09/13/2014   HGB 12.5 09/13/2014   HCT 36.1 09/13/2014   PLT 242.0 09/13/2014   GLUCOSE 119 (H) 07/04/2016   CHOL 174 07/04/2016   TRIG 287.0 (H) 07/04/2016   HDL 41.90 07/04/2016   LDLDIRECT 85.0 07/04/2016   LDLCALC 70 08/11/2013   ALT 20 07/04/2016   AST 19 07/04/2016   NA 133 (L) 07/04/2016   K 4.5 07/04/2016   CL 96 07/04/2016   CREATININE 1.20 07/04/2016   BUN 18 07/04/2016   CO2 26 07/04/2016   TSH 0.74 08/11/2013   HGBA1C 5.7 07/04/2016   MICROALBUR <0.7 07/04/2016    No results found.  Assessment & Plan:   Problem List Items Addressed This Visit    Essential hypertension    Well controlled on current regimen. Renal function stable, no changes today.  Lab Results  Component Value Date   CREATININE 1.20 07/04/2016   Lab Results  Component Value Date   NA 133 (L) 07/04/2016   K 4.5 07/04/2016   CL 96 07/04/2016   CO2 26 07/04/2016         Relevant Orders   Comprehensive metabolic panel (Completed)   Microalbumin / creatinine urine ratio (Completed)   Gouty  arthritis of toe of right foot    Will increase allopurinol dose for goal < 6.0   Lab Results  Component Value Date   LABURIC 7.2 (H) 07/04/2016         Relevant Medications   allopurinol (ZYLOPRIM) 300 MG tablet   Hyperlipidemia LDL goal <130    LDL is at goal with generic Lipitor.  She still has elevated triglycerides, but they are improving with weight loss.  Continue low glycemic index diet. Will repeat in  6 months  Lab Results  Component Value Date   CHOL 174 07/04/2016   HDL 41.90 07/04/2016   LDLCALC 70 08/11/2013   LDLDIRECT 85.0 07/04/2016   TRIG 287.0 (H) 07/04/2016   CHOLHDL 4 07/04/2016   Lab Results  Component Value Date   ALT 20 07/04/2016  AST 19 07/04/2016   ALKPHOS 104 07/04/2016   BILITOT 0.7 07/04/2016             Impaired fasting glucose    Her a1c has improved and is now normal  Lab Results  Component Value Date   HGBA1C 5.7 07/04/2016         Relevant Orders   Hemoglobin A1c (Completed)    Other Visit Diagnoses    Breast cancer screening    -  Primary   Relevant Orders   MM DIGITAL SCREENING BILATERAL   Fatigue, unspecified type       Relevant Orders   Hepatitis C antibody (Completed)   Pure hypercholesterolemia       Relevant Orders   LDL cholesterol, direct (Completed)   Lipid panel (Completed)   Idiopathic chronic gout of foot without tophus, unspecified laterality       Relevant Medications   allopurinol (ZYLOPRIM) 300 MG tablet   Other Relevant Orders   Uric acid (Completed)      I have changed Ms. Einspahr's allopurinol. I am also having her maintain her cetirizine, aspirin, diflorasone, diflorasone, OVER THE COUNTER MEDICATION, esomeprazole, Probiotic Product (PROBIOTIC PO), scopolamine, traMADol, colchicine, montelukast, ramipril, atorvastatin, triamcinolone lotion, hydrochlorothiazide, and mirtazapine.  Meds ordered this encounter  Medications  . allopurinol (ZYLOPRIM) 300 MG tablet    Sig: Take 1 tablet (300  mg total) by mouth daily.    Dispense:  90 tablet    Refill:  1    Medications Discontinued During This Encounter  Medication Reason  . allopurinol (ZYLOPRIM) 300 MG tablet Reorder   A total of 40 minutes was spent with patient more than half of which was spent in counseling patient on the above mentioned issues , reviewing and explaining recent labs and imaging studies done, and coordination of care. Follow-up: Return in about 6 months (around 01/01/2017).   [name], MD

## 2016-07-04 NOTE — Patient Instructions (Addendum)
The ShingRx vaccine will be available in about 6 months and IS ADVISED for all interested adults over 50 to prevent shingles .  We did your "physical" today but your insurance will not pay for an annual "Physical"  So it will be billed as a follow up visit.  We will get you scheduled for your "annual wellness visit"  which is covered by Medicare   Your  fasting glucose has never been  diagnostic of diabetes; but your A1c has suggested that  you are at risk for developing type 2 Diabetes.  Making  lifestyle changes  (exercising daily, losing weight and reducing your daily starch intake) can delay the progression to diabetes for a long time.     I would like to see you again  In 6 months  Your mammogram has been ordered    Health Maintenance for Postmenopausal Women Introduction Menopause is a normal process in which your reproductive ability comes to an end. This process happens gradually over a span of months to years, usually between the ages of 24 and 68. Menopause is complete when you have missed 12 consecutive menstrual periods. It is important to talk with your health care provider about some of the most common conditions that affect postmenopausal women, such as heart disease, cancer, and bone loss (osteoporosis). Adopting a healthy lifestyle and getting preventive care can help to promote your health and wellness. Those actions can also lower your chances of developing some of these common conditions. What should I know about menopause? During menopause, you may experience a number of symptoms, such as:  Moderate-to-severe hot flashes.  Night sweats.  Decrease in sex drive.  Mood swings.  Headaches.  Tiredness.  Irritability.  Memory problems.  Insomnia. Choosing to treat or not to treat menopausal changes is an individual decision that you make with your health care provider. What should I know about hormone replacement therapy and supplements? Hormone therapy products are  effective for treating symptoms that are associated with menopause, such as hot flashes and night sweats. Hormone replacement carries certain risks, especially as you become older. If you are thinking about using estrogen or estrogen with progestin treatments, discuss the benefits and risks with your health care provider. What should I know about heart disease and stroke? Heart disease, heart attack, and stroke become more likely as you age. This may be due, in part, to the hormonal changes that your body experiences during menopause. These can affect how your body processes dietary fats, triglycerides, and cholesterol. Heart attack and stroke are both medical emergencies. There are many things that you can do to help prevent heart disease and stroke:  Have your blood pressure checked at least every 1-2 years. High blood pressure causes heart disease and increases the risk of stroke.  If you are 29-62 years old, ask your health care provider if you should take aspirin to prevent a heart attack or a stroke.  Do not use any tobacco products, including cigarettes, chewing tobacco, or electronic cigarettes. If you need help quitting, ask your health care provider.  It is important to eat a healthy diet and maintain a healthy weight.  Be sure to include plenty of vegetables, fruits, low-fat dairy products, and lean protein.  Avoid eating foods that are high in solid fats, added sugars, or salt (sodium).  Get regular exercise. This is one of the most important things that you can do for your health.  Try to exercise for at least 150 minutes each  week. The type of exercise that you do should increase your heart rate and make you sweat. This is known as moderate-intensity exercise.  Try to do strengthening exercises at least twice each week. Do these in addition to the moderate-intensity exercise.  Know your numbers.Ask your health care provider to check your cholesterol and your blood glucose.  Continue to have your blood tested as directed by your health care provider. What should I know about cancer screening? There are several types of cancer. Take the following steps to reduce your risk and to catch any cancer development as early as possible. Breast Cancer  Practice breast self-awareness.  This means understanding how your breasts normally appear and feel.  It also means doing regular breast self-exams. Let your health care provider know about any changes, no matter how small.  If you are 61 or older, have a clinician do a breast exam (clinical breast exam or CBE) every year. Depending on your age, family history, and medical history, it may be recommended that you also have a yearly breast X-ray (mammogram).  If you have a family history of breast cancer, talk with your health care provider about genetic screening.  If you are at high risk for breast cancer, talk with your health care provider about having an MRI and a mammogram every year.  Breast cancer (BRCA) gene test is recommended for women who have family members with BRCA-related cancers. Results of the assessment will determine the need for genetic counseling and BRCA1 and for BRCA2 testing. BRCA-related cancers include these types:  Breast. This occurs in males or females.  Ovarian.  Tubal. This may also be called fallopian tube cancer.  Cancer of the abdominal or pelvic lining (peritoneal cancer).  Prostate.  Pancreatic. Cervical, Uterine, and Ovarian Cancer  Your health care provider may recommend that you be screened regularly for cancer of the pelvic organs. These include your ovaries, uterus, and vagina. This screening involves a pelvic exam, which includes checking for microscopic changes to the surface of your cervix (Pap test).  For women ages 21-65, health care providers may recommend a pelvic exam and a Pap test every three years. For women ages 43-65, they may recommend the Pap test and pelvic  exam, combined with testing for human papilloma virus (HPV), every five years. Some types of HPV increase your risk of cervical cancer. Testing for HPV may also be done on women of any age who have unclear Pap test results.  Other health care providers may not recommend any screening for nonpregnant women who are considered low risk for pelvic cancer and have no symptoms. Ask your health care provider if a screening pelvic exam is right for you.  If you have had past treatment for cervical cancer or a condition that could lead to cancer, you need Pap tests and screening for cancer for at least 20 years after your treatment. If Pap tests have been discontinued for you, your risk factors (such as having a new sexual partner) need to be reassessed to determine if you should start having screenings again. Some women have medical problems that increase the chance of getting cervical cancer. In these cases, your health care provider may recommend that you have screening and Pap tests more often.  If you have a family history of uterine cancer or ovarian cancer, talk with your health care provider about genetic screening.  If you have vaginal bleeding after reaching menopause, tell your health care provider.  There are currently no  reliable tests available to screen for ovarian cancer. Lung Cancer  Lung cancer screening is recommended for adults 67-70 years old who are at high risk for lung cancer because of a history of smoking. A yearly low-dose CT scan of the lungs is recommended if you:  Currently smoke.  Have a history of at least 30 pack-years of smoking and you currently smoke or have quit within the past 15 years. A pack-year is smoking an average of one pack of cigarettes per day for one year. Yearly screening should:  Continue until it has been 15 years since you quit.  Stop if you develop a health problem that would prevent you from having lung cancer treatment. Colorectal Cancer  This  type of cancer can be detected and can often be prevented.  Routine colorectal cancer screening usually begins at age 64 and continues through age 26.  If you have risk factors for colon cancer, your health care provider may recommend that you be screened at an earlier age.  If you have a family history of colorectal cancer, talk with your health care provider about genetic screening.  Your health care provider may also recommend using home test kits to check for hidden blood in your stool.  A small camera at the end of a tube can be used to examine your colon directly (sigmoidoscopy or colonoscopy). This is done to check for the earliest forms of colorectal cancer.  Direct examination of the colon should be repeated every 5-10 years until age 58. However, if early forms of precancerous polyps or small growths are found or if you have a family history or genetic risk for colorectal cancer, you may need to be screened more often. Skin Cancer  Check your skin from head to toe regularly.  Monitor any moles. Be sure to tell your health care provider:  About any new moles or changes in moles, especially if there is a change in a mole's shape or color.  If you have a mole that is larger than the size of a pencil eraser.  If any of your family members has a history of skin cancer, especially at a young age, talk with your health care provider about genetic screening.  Always use sunscreen. Apply sunscreen liberally and repeatedly throughout the day.  Whenever you are outside, protect yourself by wearing long sleeves, pants, a wide-brimmed hat, and sunglasses. What should I know about osteoporosis? Osteoporosis is a condition in which bone destruction happens more quickly than new bone creation. After menopause, you may be at an increased risk for osteoporosis. To help prevent osteoporosis or the bone fractures that can happen because of osteoporosis, the following is recommended:  If you are  44-62 years old, get at least 1,000 mg of calcium and at least 600 mg of vitamin D per day.  If you are older than age 40 but younger than age 86, get at least 1,200 mg of calcium and at least 600 mg of vitamin D per day.  If you are older than age 20, get at least 1,200 mg of calcium and at least 800 mg of vitamin D per day. Smoking and excessive alcohol intake increase the risk of osteoporosis. Eat foods that are rich in calcium and vitamin D, and do weight-bearing exercises several times each week as directed by your health care provider. What should I know about how menopause affects my mental health? Depression may occur at any age, but it is more common as you become  older. Common symptoms of depression include:  Low or sad mood.  Changes in sleep patterns.  Changes in appetite or eating patterns.  Feeling an overall lack of motivation or enjoyment of activities that you previously enjoyed.  Frequent crying spells. Talk with your health care provider if you think that you are experiencing depression. What should I know about immunizations? It is important that you get and maintain your immunizations. These include:  Tetanus, diphtheria, and pertussis (Tdap) booster vaccine.  Influenza every year before the flu season begins.  Pneumonia vaccine.  Shingles vaccine. Your health care provider may also recommend other immunizations. This information is not intended to replace advice given to you by your health care provider. Make sure you discuss any questions you have with your health care provider. Document Released: 06/21/2005 Document Revised: 11/17/2015 Document Reviewed: 01/31/2015  2017 [employer]vier

## 2016-07-04 NOTE — Progress Notes (Signed)
Pre visit review using our clinic review tool, if applicable. No additional management support is needed unless otherwise documented below in the visit note. 

## 2016-07-04 NOTE — Progress Notes (Signed)
I called pt and she stated she will call back.

## 2016-07-05 LAB — HEPATITIS C ANTIBODY: HCV Ab: NEGATIVE

## 2016-07-05 NOTE — Telephone Encounter (Signed)
Pt was seen 07/04/16 by PCP. See OV note.

## 2016-07-06 DIAGNOSIS — E1140 Type 2 diabetes mellitus with diabetic neuropathy, unspecified: Secondary | ICD-10-CM | POA: Insufficient documentation

## 2016-07-06 DIAGNOSIS — E1159 Type 2 diabetes mellitus with other circulatory complications: Secondary | ICD-10-CM | POA: Insufficient documentation

## 2016-07-06 DIAGNOSIS — E119 Type 2 diabetes mellitus without complications: Secondary | ICD-10-CM | POA: Insufficient documentation

## 2016-07-06 MED ORDER — ALLOPURINOL 300 MG PO TABS: 300.0000 mg | ORAL_TABLET | Freq: Every day | ORAL | 1 refills | Status: DC

## 2016-07-06 NOTE — Assessment & Plan Note (Signed)
Her a1c has improved and is now normal  Lab Results  Component Value Date   HGBA1C 5.7 07/04/2016

## 2016-07-06 NOTE — Assessment & Plan Note (Signed)
Well controlled on current regimen. Renal function stable, no changes today.  Lab Results  Component Value Date   CREATININE 1.20 07/04/2016   Lab Results  Component Value Date   NA 133 (L) 07/04/2016   K 4.5 07/04/2016   CL 96 07/04/2016   CO2 26 07/04/2016

## 2016-07-06 NOTE — Assessment & Plan Note (Signed)
Will increase allopurinol dose for goal < 6.0   Lab Results  Component Value Date   LABURIC 7.2 (H) 07/04/2016

## 2016-07-06 NOTE — Assessment & Plan Note (Signed)
LDL is at goal with generic Lipitor.  She still has elevated triglycerides, but they are improving with weight loss.  Continue low glycemic index diet. Will repeat in  6 months  Lab Results  Component Value Date   CHOL 174 07/04/2016   HDL 41.90 07/04/2016   LDLCALC 70 08/11/2013   LDLDIRECT 85.0 07/04/2016   TRIG 287.0 (H) 07/04/2016   CHOLHDL 4 07/04/2016   Lab Results  Component Value Date   ALT 20 07/04/2016   AST 19 07/04/2016   ALKPHOS 104 07/04/2016   BILITOT 0.7 07/04/2016

## 2016-07-07 ENCOUNTER — Encounter: Payer: Self-pay | Admitting: Internal Medicine

## 2016-07-11 NOTE — Progress Notes (Signed)
Ok. Yes pt states she will call back to resch AWV. I reached out to her twice. :)

## 2016-07-11 NOTE — Progress Notes (Signed)
Your Welcome!

## 2016-09-10 HISTORY — PX: DENTAL SURGERY: SHX609

## 2016-09-13 DIAGNOSIS — J320 Chronic maxillary sinusitis: Secondary | ICD-10-CM | POA: Diagnosis not present

## 2016-09-22 ENCOUNTER — Other Ambulatory Visit: Payer: Self-pay | Admitting: Internal Medicine

## 2016-09-23 ENCOUNTER — Telehealth: Payer: Self-pay | Admitting: Internal Medicine

## 2016-09-23 NOTE — Telephone Encounter (Signed)
Called pt to schedule AWV. No answer, No voicemail

## 2016-10-01 ENCOUNTER — Other Ambulatory Visit: Payer: Self-pay | Admitting: Internal Medicine

## 2016-10-21 NOTE — Telephone Encounter (Signed)
Left pt message asking to call Allison back directly at [phone] to schedule AWV. Thanks! °

## 2016-11-17 ENCOUNTER — Other Ambulatory Visit: Payer: Self-pay | Admitting: Internal Medicine

## 2016-11-17 DIAGNOSIS — M109 Gout, unspecified: Secondary | ICD-10-CM

## 2017-03-20 ENCOUNTER — Other Ambulatory Visit: Payer: Self-pay | Admitting: Internal Medicine

## 2017-03-20 NOTE — Telephone Encounter (Signed)
Refill request for Remeron, last seen [id number], last filled [id number].  Please advise.

## 2017-03-22 NOTE — Telephone Encounter (Signed)
Patient is overdue for 6 month follow up and labs on the medications you already approved (atorvastatin and ramipiril require a CMET every 6 months, that should be on your refill protocol) .  Please call Kari [name] and set that lab up asap , .  Refill on mirtazipine done

## 2017-05-28 ENCOUNTER — Ambulatory Visit: Payer: Medicare Other

## 2017-05-28 ENCOUNTER — Other Ambulatory Visit: Payer: Self-pay

## 2017-05-28 ENCOUNTER — Ambulatory Visit (INDEPENDENT_AMBULATORY_CARE_PROVIDER_SITE_OTHER): Payer: Medicare Other

## 2017-05-28 VITALS — BP 132/64 | HR 82 | Temp 97.6°F | Resp 16 | Ht 64.0 in | Wt 236.4 lb

## 2017-05-28 DIAGNOSIS — Z0000 Encounter for general adult medical examination without abnormal findings: Secondary | ICD-10-CM | POA: Diagnosis not present

## 2017-05-28 MED ORDER — DIFLORASONE DIACETATE 0.05 % EX CREA: 1.0000 "application " | TOPICAL_CREAM | Freq: Two times a day (BID) | CUTANEOUS | 5 refills | Status: DC

## 2017-05-28 MED ORDER — DIFLORASONE DIACETATE 0.05 % EX OINT: TOPICAL_OINTMENT | Freq: Every day | CUTANEOUS | 6 refills | Status: DC

## 2017-05-28 NOTE — Progress Notes (Signed)
Subjective:   Kari [name] is a 72 y.o. female who presents for Medicare Annual (Subsequent) preventive examination.  Review of Systems:  No ROS.  Medicare Wellness Visit. Additional risk factors are reflected in the social history.  Cardiac Risk Factors include: advanced age (>69men, >4 women);hypertension     Objective:     Vitals: BP 132/64 (BP Location: Left Arm, Patient Position: Sitting, Cuff Size: Normal)   Pulse 82   Temp 97.6 F (36.4 C) (Oral)   Resp 16   Ht 5\' 4"  (1.626 m)   Wt 236 lb 6.4 oz (107.2 kg)   SpO2 95%   BMI 40.58 kg/m   Body mass index is 40.58 kg/m.  Advanced Directives 05/28/2017 11/09/2014  Does Patient Have a Medical Advance Directive? Yes Yes  Type of Paramedic of [place];Living will [place];Living will  Does patient want to make changes to medical advance directive? No - Patient declined No - Patient declined  Copy of [place] in Chart? No - copy requested No - copy requested    Tobacco Social History   Tobacco Use  Smoking Status Former Smoker  . Last attempt to quit: 01/02/1971  . Years since quitting: 46.4  Smokeless Tobacco Never Used     Counseling given: Not Answered   Clinical Intake:  Pre-visit preparation completed: Yes  Pain : No/denies pain     Nutritional Status: BMI > 30  Obese Diabetes: No  How often do you need to have someone help you when you read instructions, pamphlets, or other written materials from your doctor or pharmacy?: 1 - Never  Interpreter Needed?: No     Past Medical History:  Diagnosis Date  . Allergy    to cat, dogs, pollen, dust, cigarette smoke  . Arthritis    feet  . Asthma   . Depression   . GERD (gastroesophageal reflux disease)   . Hx of adenomatous colonic polyps 05/30/2010  . Hyperlipidemia   . Hypertension   . Menopause   . Migraines   . Miscarriage    X 1   Past Surgical History:  Procedure Laterality  Date  . CESAREAN SECTION     X 2  . DENTAL SURGERY  09/2016  . TUBAL LIGATION  1980   Family History  Problem Relation Age of Onset  . Myasthenia gravis Mother   . Coronary artery disease Father   . Valvular heart disease Father   . Heart attack Father 68  . Cancer Sister 23       colon  . Colon cancer Sister 60  . Heart attack Paternal Grandfather        40's   Social History   Socioeconomic History  . Marital status: Married    Spouse name: None  . Number of children: None  . Years of education: None  . Highest education level: None  Social Needs  . Financial resource strain: None  . Food insecurity - worry: None  . Food insecurity - inability: None  . Transportation needs - medical: None  . Transportation needs - non-medical: None  Occupational History  . None  Tobacco Use  . Smoking status: Former Smoker    Last attempt to quit: 01/02/1971    Years since quitting: 46.4  . Smokeless tobacco: Never Used  Substance and Sexual Activity  . Alcohol use: Yes    Alcohol/week: 0.0 oz    Comment: occasional- wine gives pt headache only drinks  once every 6 months  . Drug use: No  . Sexual activity: Not Currently  Other Topics Concern  . None  Social History Narrative  . None    Outpatient Encounter Medications as of 05/28/2017  Medication Sig  . allopurinol (ZYLOPRIM) 300 MG tablet TAKE 1 TABLET BY MOUTH  DAILY  . aspirin 325 MG tablet Take 325 mg by mouth daily.    Marland Kitchen atorvastatin (LIPITOR) 10 MG tablet TAKE 1 TABLET BY MOUTH  DAILY  . cetirizine (ZYRTEC) 10 MG tablet Take 10 mg by mouth daily.    . colchicine 0.6 MG tablet Take 1 tablet (0.6 mg total) by mouth 2 (two) times daily. As needed for gout flare.  . diflorasone (PSORCON) 0.05 % cream Apply 1 application topically 2 (two) times daily. (Patient taking differently: Apply 1 application topically as needed. )  . diflorasone (PSORCON) 0.05 % ointment Apply topically daily.  Marland Kitchen esomeprazole (NEXIUM) 20 MG capsule  Take 20 mg by mouth daily at 12 noon.  . hydrochlorothiazide (HYDRODIURIL) 25 MG tablet TAKE 1 TABLET BY MOUTH  DAILY  . mirtazapine (REMERON) 45 MG tablet TAKE 1 TABLET BY MOUTH AT  BEDTIME  . montelukast (SINGULAIR) 10 MG tablet TAKE 1 TABLET BY MOUTH AT  BEDTIME  . OVER THE COUNTER MEDICATION Nasocort nasal spray  . Probiotic Product (PROBIOTIC PO) Take by mouth.  . ramipril (ALTACE) 10 MG capsule TAKE 1 CAPSULE BY MOUTH  DAILY  . scopolamine (TRANSDERM-SCOP, 1.5 MG,) 1 MG/3DAYS Place 1 patch (1.5 mg total) onto the skin every 3 (three) days.  . traMADol (ULTRAM) 50 MG tablet Take 1 tablet (50 mg total) by mouth every 8 (eight) hours as needed.  . triamcinolone lotion (KENALOG) 0.1 % APPLY TO AFFECTED AREA(S)  TWO TIMES DAILY   No facility-administered encounter medications on file as of 05/28/2017.     Activities of Daily Living In your present state of health, do you have any difficulty performing the following activities: 05/28/2017  Hearing? Y  Vision? N  Difficulty concentrating or making decisions? N  Walking or climbing stairs? N  Dressing or bathing? N  Doing errands, shopping? N  Preparing Food and eating ? N  Using the Toilet? N  In the past six months, have you accidently leaked urine? N  Do you have problems with loss of bowel control? N  Managing your Medications? N  Managing your Finances? N  Housekeeping or managing your Housekeeping? N  Some recent data might be hidden    Patient Care Team: [name], MD as PCP - General (Internal Medicine)    Assessment:   This is a routine wellness examination for Kari [name]. The goal of the wellness visit is to assist the patient how to close the gaps in care and create a preventative care plan for the patient.   The roster of all physicians providing medical care to patient is listed in the Snapshot section of the chart.  Osteoporosis risk reviewed.    Safety issues reviewed; Smoke and carbon monoxide detectors in the  home. No firearms in the home.  Wears seatbelts when driving or riding with others. Patient does wear sunscreen or protective clothing when in direct sunlight. No violence in the home.  Patient is alert, normal appearance, oriented to person/place/and time. Correctly identified the president of the [place], recall of 3/3 words, and performing simple calculations. Displays appropriate judgement and can read correct time from watch face.   No new identified risk were noted.  No failures at ADL's or IADL's.    BMI- discussed the importance of a healthy diet, water intake and the benefits of aerobic exercise. Educational material provided.   24 hour diet recall: Daily fluid intake: 0 cups of caffeine, 4 cups of water  Dental- regular visits. UTD.  Phillis Haggis.  Eye- Visual acuity not assessed per patient preference since they have regular follow up with the ophthalmologist.  Wears corrective lenses.  Sleep patterns- Sleeps 8 hours at night.  Wakes feeling rested.  Mammogram; follow as directed.  Educational material provided.  Patient Concerns: None at this time. Follow up with PCP as needed.  Exercise Activities and Dietary recommendations Current Exercise Habits: The patient does not participate in regular exercise at present  Goals    . Increase physical activity     Walk for exercise Silver sne[place] program       Fall Risk Fall Risk  05/28/2017 07/04/2016 09/13/2014  Falls in the past year? Yes No No  Number falls in past yr: 1 - -  Injury with Fall? No - -   Depression Screen PHQ 2/9 Scores 05/28/2017 09/13/2014  PHQ - 2 Score 0 2  PHQ- 9 Score - 4     Cognitive Function MMSE - Mini Mental State Exam 05/28/2017  Orientation to time 5  Orientation to Place 5  Registration 3  Attention/ Calculation 5  Recall 3  Language- name 2 objects 2  Language- repeat 1  Language- follow 3 step command 3  Language- read & follow direction 1  Write a sentence 1  Copy design 1    Total score 30        Immunization History  Administered Date(s) Administered  . Influenza,inj,Quad PF,6+ Mos 03/03/2013  . Influenza-Unspecified 01/11/2014, 02/13/2015, 02/27/2016, 03/04/2017  . Pneumococcal Conjugate-13 03/03/2013  . Pneumococcal Polysaccharide-23 08/14/2015  . Tdap 03/03/2008   Screening Tests Health Maintenance  Topic Date Due  . MAMMOGRAM  04/15/2015  . TETANUS/TDAP  03/03/2018  . COLONOSCOPY  11/23/2019  . INFLUENZA VACCINE  Completed  . DEXA SCAN  Completed  . Hepatitis C Screening  Completed  . PNA vac Low Risk Adult  Completed      Plan:    End of life planning; Advance aging; Advanced directives discussed. Copy of current HCPOA/Living Will requested.    I have personally reviewed and noted the following in the patient's chart:   . Medical and social history . Use of alcohol, tobacco or illicit drugs  . Current medications and supplements . Functional ability and status . Nutritional status . Physical activity . Advanced directives . List of other physicians . Hospitalizations, surgeries, and ER visits in previous 12 months . Vitals . Screenings to include cognitive, depression, and falls . Referrals and appointments  In addition, I have reviewed and discussed with patient certain preventive protocols, quality metrics, and best practice recommendations. A written personalized care plan for preventive services as well as general preventive health recommendations were provided to patient.     OBrien-Blaney, [name] L, LPN  0/37/0488   I have reviewed the above information and agree with above.   Deborra Medina, MD

## 2017-05-28 NOTE — Patient Instructions (Addendum)
Kari [name] , Thank you for taking time to come for your Medicare Wellness Visit. I appreciate your ongoing commitment to your health goals. Please review the following plan we discussed and let me know if I can assist you in the future.   Follow up with Dr. [name] as needed.    Bring a copy of your [place] and/or Living Will to be scanned into chart.  Schedule mammogram.  Have a great day!  These are the goals we discussed: Goals    . Increase physical activity     Walk for exercise Silver sneaker program       This is a list of the screening recommended for you and due dates:  Health Maintenance  Topic Date Due  . Mammogram  04/15/2015  . Tetanus Vaccine  03/03/2018  . Colon Cancer Screening  11/23/2019  . Flu Shot  Completed  . DEXA scan (bone density measurement)  Completed  .  Hepatitis C: One time screening is recommended by Center for Disease Control  (CDC) for  adults born from [birth year] through 1963.   Completed  . Pneumonia vaccines  Completed    Mammogram A mammogram is an X-ray of the breasts that is done to check for abnormal changes. This procedure can screen for and detect any changes that may suggest breast cancer. A mammogram can also identify other changes and variations in the breast, such as:  Inflammation of the breast tissue (mastitis).  An infected area that contains a collection of pus (abscess).  A fluid-filled sac (cyst).  Fibrocystic changes. This is when breast tissue becomes denser, which can make the tissue feel rope-like or uneven under the skin.  Tumors that are not cancerous (benign).  Tell a health care provider about:  Any allergies you have.  If you have breast implants.  If you have had previous breast disease, biopsy, or surgery.  If you are breastfeeding.  Any possibility that you could be pregnant, if this applies.  If you are younger than age 20.  If you have a family history of breast cancer. What  are the risks? Generally, this is a safe procedure. However, problems may occur, including:  Exposure to radiation. Radiation levels are very low with this test.  The results being misinterpreted.  The need for further tests.  The inability of the mammogram to detect certain cancers.  What happens before the procedure?  Schedule your test about 1-2 weeks after your menstrual period. This is usually when your breasts are the least tender.  If you have had a mammogram done at a different facility in the past, get the mammogram X-rays or have them sent to your current exam facility in order to compare them.  Wash your breasts and under your arms the day of the test.  Do not wear deodorants, perfumes, lotions, or powders anywhere on your body on the day of the test.  Remove any jewelry from your neck.  Wear clothes that you can change into and out of easily. What happens during the procedure?  You will undress from the waist up and put on a gown.  You will stand in front of the X-ray machine.  Each breast will be placed between two plastic or glass plates. The plates will compress your breast for a few seconds. Try to stay as relaxed as possible during the procedure. This does not cause any harm to your breasts and any discomfort you feel will be very brief.  X-rays will be taken from different angles of each breast. The procedure may vary among health care providers and hospitals. What happens after the procedure?  The mammogram will be examined by a specialist (radiologist).  You may need to repeat certain parts of the test, depending on the quality of the images. This is commonly done if the radiologist needs a better view of the breast tissue.  Ask when your test results will be ready. Make sure you get your test results.  You may resume your normal activities. This information is not intended to replace advice given to you by your health care provider. Make sure you  discuss any questions you have with your health care provider. Document Released: 04/26/2000 Document Revised: 10/02/2015 Document Reviewed: 07/08/2014 Elsevier Interactive Patient Education  [employer].

## 2017-06-05 ENCOUNTER — Telehealth: Payer: Self-pay | Admitting: Internal Medicine

## 2017-06-05 NOTE — Telephone Encounter (Signed)
Medication request.

## 2017-06-05 NOTE — Telephone Encounter (Signed)
Please advise 

## 2017-06-05 NOTE — Telephone Encounter (Signed)
Copied from [place] [phone]. Topic: Quick Communication - Rx Refill/Question >> Jun 05, 2017  2:44 PM Corie Chiquito, [place] wrote: Medication: Diflorasone   Has the patient contacted their pharmacy? YES  Vicente Males called from the pharmacy because the Diflorasone is not covered by the patient insurance and would like to know if she could have a different medication that is covered by the insurance.   Preferred Pharmacy (with phone number or street name): [facility]. [phone]   Agent: Please be advised that RX refills may take up to 3 business days. We ask that you follow-up with your pharmacy.

## 2017-06-06 NOTE — Telephone Encounter (Signed)
LMTCB. Need to let pt know that [facility] [place] on [facility]28/2017. PEC may speak with pt.

## 2017-06-10 ENCOUNTER — Telehealth: Payer: Self-pay

## 2017-06-10 MED ORDER — TRIAMCINOLONE ACETONIDE 0.1 % EX LOTN: TOPICAL_LOTION | CUTANEOUS | 1 refills | Status: DC

## 2017-06-10 NOTE — Telephone Encounter (Signed)
Spoke with pharmacist and she stated that the rx has been placed on hold because it is not covered by pt's insurance. Pharmacist is going to fax over a PA so we can get started on it.

## 2017-06-10 NOTE — Telephone Encounter (Signed)
Medication has been [facility] per Dr. Lupita Dawn okay.

## 2017-06-10 NOTE — Telephone Encounter (Signed)
Copied from [place] [phone]. Topic: Quick Communication - See Telephone Encounter >> Jun 06, 2017  3:25 PM Adair Laundry, CMA wrote: CRM for notification. See Telephone encounter for:   06/06/17.  PEC may speak with pt.  >> Jun 10, 2017 11:37 AM Robina Ade, Helene Kelp D wrote: Patient called back and said that she would like triamcinolone acetonide cream instead of her diflorasone sent to [facility] but [facility] If there are any question please call patient back, thanks.

## 2017-06-10 NOTE — Telephone Encounter (Signed)
Is this okay to send to [facility]?

## 2017-06-10 NOTE — Telephone Encounter (Signed)
yes

## 2017-06-15 ENCOUNTER — Other Ambulatory Visit: Payer: Self-pay | Admitting: Internal Medicine

## 2017-06-15 DIAGNOSIS — M109 Gout, unspecified: Secondary | ICD-10-CM

## 2017-06-25 DIAGNOSIS — Z1231 Encounter for screening mammogram for malignant neoplasm of breast: Secondary | ICD-10-CM | POA: Diagnosis not present

## 2017-06-25 LAB — HM MAMMOGRAPHY

## 2017-07-23 ENCOUNTER — Ambulatory Visit (INDEPENDENT_AMBULATORY_CARE_PROVIDER_SITE_OTHER): Payer: Medicare Other | Admitting: Internal Medicine

## 2017-07-23 ENCOUNTER — Encounter: Payer: Self-pay | Admitting: Internal Medicine

## 2017-07-23 VITALS — BP 120/72 | HR 78 | Temp 97.6°F | Resp 16 | Ht 64.0 in | Wt 239.6 lb

## 2017-07-23 DIAGNOSIS — Z780 Asymptomatic menopausal state: Secondary | ICD-10-CM | POA: Diagnosis not present

## 2017-07-23 DIAGNOSIS — I10 Essential (primary) hypertension: Secondary | ICD-10-CM

## 2017-07-23 DIAGNOSIS — R5383 Other fatigue: Secondary | ICD-10-CM | POA: Diagnosis not present

## 2017-07-23 DIAGNOSIS — Z1231 Encounter for screening mammogram for malignant neoplasm of breast: Secondary | ICD-10-CM

## 2017-07-23 DIAGNOSIS — E785 Hyperlipidemia, unspecified: Secondary | ICD-10-CM

## 2017-07-23 DIAGNOSIS — Z6841 Body Mass Index (BMI) 40.0 and over, adult: Secondary | ICD-10-CM

## 2017-07-23 DIAGNOSIS — Z1239 Encounter for other screening for malignant neoplasm of breast: Secondary | ICD-10-CM

## 2017-07-23 DIAGNOSIS — R7301 Impaired fasting glucose: Secondary | ICD-10-CM | POA: Diagnosis not present

## 2017-07-23 LAB — CBC WITH DIFFERENTIAL/PLATELET
BASOS PCT: 0.7 % (ref 0.0–3.0)
Basophils Absolute: 0.1 10*3/uL (ref 0.0–0.1)
Eosinophils Absolute: 0.3 10*3/uL (ref 0.0–0.7)
Eosinophils Relative: 3.2 % (ref 0.0–5.0)
HEMATOCRIT: 37.6 % (ref 36.0–46.0)
HEMOGLOBIN: 12.9 g/dL (ref 12.0–15.0)
LYMPHS PCT: 18.6 % (ref 12.0–46.0)
Lymphs Abs: 1.8 10*3/uL (ref 0.7–4.0)
MCHC: 34.4 g/dL (ref 30.0–36.0)
MCV: 91 fl (ref 78.0–100.0)
Monocytes Absolute: 0.7 10*3/uL (ref 0.1–1.0)
Monocytes Relative: 7.1 % (ref 3.0–12.0)
Neutro Abs: 6.7 10*3/uL (ref 1.4–7.7)
Neutrophils Relative %: 70.4 % (ref 43.0–77.0)
Platelets: 235 10*3/uL (ref 150.0–400.0)
RBC: 4.13 Mil/uL (ref 3.87–5.11)
RDW: 15 % (ref 11.5–15.5)
WBC: 9.5 10*3/uL (ref 4.0–10.5)

## 2017-07-23 LAB — LIPID PANEL
Cholesterol: 160 mg/dL (ref 0–200)
HDL: 42.2 mg/dL (ref >39.00)
NonHDL: 118.08
TRIGLYCERIDES: 210 mg/dL — AB (ref 0.0–149.0)
Total CHOL/HDL Ratio: 4
VLDL: 42 mg/dL — ABNORMAL HIGH (ref 0.0–40.0)

## 2017-07-23 LAB — TSH: TSH: 1.78 u[IU]/mL (ref 0.35–4.50)

## 2017-07-23 LAB — COMPREHENSIVE METABOLIC PANEL
ALBUMIN: 4 g/dL (ref 3.5–5.2)
ALK PHOS: 97 U/L (ref 39–117)
ALT: 28 U/L (ref 0–35)
AST: 25 U/L (ref 0–37)
BILIRUBIN TOTAL: 0.6 mg/dL (ref 0.2–1.2)
BUN: 22 mg/dL (ref 6–23)
CALCIUM: 9.7 mg/dL (ref 8.4–10.5)
CO2: 29 mEq/L (ref 19–32)
CREATININE: 1.13 mg/dL (ref 0.40–1.20)
Chloride: 101 mEq/L (ref 96–112)
GFR: 50.61 mL/min — ABNORMAL LOW (ref >60.00)
Glucose, Bld: 114 mg/dL — ABNORMAL HIGH (ref 70–99)
Potassium: 4.3 mEq/L (ref 3.5–5.1)
SODIUM: 138 meq/L (ref 135–145)
TOTAL PROTEIN: 7.1 g/dL (ref 6.0–8.3)

## 2017-07-23 LAB — HEMOGLOBIN A1C: Hgb A1c MFr Bld: 5.8 % (ref 4.6–6.5)

## 2017-07-23 LAB — LDL CHOLESTEROL, DIRECT: Direct LDL: 95 mg/dL

## 2017-07-23 MED ORDER — ZOSTER VAC RECOMB ADJUVANTED 50 MCG/0.5ML IM SUSR: 0.5000 mL | Freq: Once | INTRAMUSCULAR | 1 refills | Status: AC

## 2017-07-23 MED ORDER — TETANUS-DIPHTHERIA TOXOIDS TD 2-2 LF/0.5ML IM SUSP: 0.5000 mL | Freq: Once | INTRAMUSCULAR | 0 refills | Status: AC

## 2017-07-23 NOTE — Patient Instructions (Addendum)
For your insomnia:   Try the app called "Head space  " On I -phone  No cell phone use  or I paduse  2 hours before bedtime ! Reduce afternoon caffeine use as much as possible  Front load your water intake intake to before 7 pm    I have ordered a repeat DEXA sca   The ShingRx vaccine will be available in about 6 months and IS ADVISED for all interested adults over 50 to prevent shingles   The Tetanus vaccine is also due this year.  This vaccine is s $110 out of pocket since medicare will not pay for it,    Most pharmacies will offer it as well for far less  ([employer] supposed;y offers it for $47)   Health Maintenance for Postmenopausal Women Menopause is a normal process in which your reproductive ability comes to an end. This process happens gradually over a span of months to years, usually between the ages of 61 and 50. Menopause is complete when you have missed 12 consecutive menstrual periods. It is important to talk with your health care provider about some of the most common conditions that affect postmenopausal women, such as heart disease, cancer, and bone loss (osteoporosis). Adopting a healthy lifestyle and getting preventive care can help to promote your health and wellness. Those actions can also lower your chances of developing some of these common conditions. What should I know about menopause? During menopause, you may experience a number of symptoms, such as:  Moderate-to-severe hot flashes.  Night sweats.  Decrease in sex drive.  Mood swings.  Headaches.  Tiredness.  Irritability.  Memory problems.  Insomnia.  Choosing to treat or not to treat menopausal changes is an individual decision that you make with your health care provider. What should I know about hormone replacement therapy and supplements? Hormone therapy products are effective for treating symptoms that are associated with menopause, such as hot flashes and night sweats. Hormone replacement  carries certain risks, especially as you become older. If you are thinking about using estrogen or estrogen with progestin treatments, discuss the benefits and risks with your health care provider. What should I know about heart disease and stroke? Heart disease, heart attack, and stroke become more likely as you age. This may be due, in part, to the hormonal changes that your body experiences during menopause. These can affect how your body processes dietary fats, triglycerides, and cholesterol. Heart attack and stroke are both medical emergencies. There are many things that you can do to help prevent heart disease and stroke:  Have your blood pressure checked at least every 1-2 years. High blood pressure causes heart disease and increases the risk of stroke.  If you are 39-65 years old, ask your health care provider if you should take aspirin to prevent a heart attack or a stroke.  Do not use any tobacco products, including cigarettes, chewing tobacco, or electronic cigarettes. If you need help quitting, ask your health care provider.  It is important to eat a healthy diet and maintain a healthy weight. ? Be sure to include plenty of vegetables, fruits, low-fat dairy products, and lean protein. ? Avoid eating foods that are high in solid fats, added sugars, or salt (sodium).  Get regular exercise. This is one of the most important things that you can do for your health. ? Try to exercise for at least 150 minutes each week. The type of exercise that you do should increase your heart rate  and make you sweat. This is known as moderate-intensity exercise. ? Try to do strengthening exercises at least twice each week. Do these in addition to the moderate-intensity exercise.  Know your numbers.Ask your health care provider to check your cholesterol and your blood glucose. Continue to have your blood tested as directed by your health care provider.  What should I know about cancer screening? There  are several types of cancer. Take the following steps to reduce your risk and to catch any cancer development as early as possible. Breast Cancer  Practice breast self-awareness. ? This means understanding how your breasts normally appear and feel. ? It also means doing regular breast self-exams. Let your health care provider know about any changes, no matter how small.  If you are 82 or older, have a clinician do a breast exam (clinical breast exam or CBE) every year. Depending on your age, family history, and medical history, it may be recommended that you also have a yearly breast X-ray (mammogram).  If you have a family history of breast cancer, talk with your health care provider about genetic screening.  If you are at high risk for breast cancer, talk with your health care provider about having an MRI and a mammogram every year.  Breast cancer (BRCA) gene test is recommended for women who have family members with BRCA-related cancers. Results of the assessment will determine the need for genetic counseling and BRCA1 and for BRCA2 testing. BRCA-related cancers include these types: ? Breast. This occurs in males or females. ? Ovarian. ? Tubal. This may also be called fallopian tube cancer. ? Cancer of the abdominal or pelvic lining (peritoneal cancer). ? Prostate. ? Pancreatic.  Cervical, Uterine, and Ovarian Cancer Your health care provider may recommend that you be screened regularly for cancer of the pelvic organs. These include your ovaries, uterus, and vagina. This screening involves a pelvic exam, which includes checking for microscopic changes to the surface of your cervix (Pap test).  For women ages 21-65, health care providers may recommend a pelvic exam and a Pap test every three years. For women ages 47-65, they may recommend the Pap test and pelvic exam, combined with testing for human papilloma virus (HPV), every five years. Some types of HPV increase your risk of cervical  cancer. Testing for HPV may also be done on women of any age who have unclear Pap test results.  Other health care providers may not recommend any screening for nonpregnant women who are considered low risk for pelvic cancer and have no symptoms. Ask your health care provider if a screening pelvic exam is right for you.  If you have had past treatment for cervical cancer or a condition that could lead to cancer, you need Pap tests and screening for cancer for at least 20 years after your treatment. If Pap tests have been discontinued for you, your risk factors (such as having a new sexual partner) need to be reassessed to determine if you should start having screenings again. Some women have medical problems that increase the chance of getting cervical cancer. In these cases, your health care provider may recommend that you have screening and Pap tests more often.  If you have a family history of uterine cancer or ovarian cancer, talk with your health care provider about genetic screening.  If you have vaginal bleeding after reaching menopause, tell your health care provider.  There are currently no reliable tests available to screen for ovarian cancer.  Lung Cancer Lung  cancer screening is recommended for adults 2-1 years old who are at high risk for lung cancer because of a history of smoking. A yearly low-dose CT scan of the lungs is recommended if you:  Currently smoke.  Have a history of at least 30 pack-years of smoking and you currently smoke or have quit within the past 15 years. A pack-year is smoking an average of one pack of cigarettes per day for one year.  Yearly screening should:  Continue until it has been 15 years since you quit.  Stop if you develop a health problem that would prevent you from having lung cancer treatment.  Colorectal Cancer  This type of cancer can be detected and can often be prevented.  Routine colorectal cancer screening usually begins at age 64  and continues through age 54.  If you have risk factors for colon cancer, your health care provider may recommend that you be screened at an earlier age.  If you have a family history of colorectal cancer, talk with your health care provider about genetic screening.  Your health care provider may also recommend using home test kits to check for hidden blood in your stool.  A small camera at the end of a tube can be used to examine your colon directly (sigmoidoscopy or colonoscopy). This is done to check for the earliest forms of colorectal cancer.  Direct examination of the colon should be repeated every 5-10 years until age 57. However, if early forms of precancerous polyps or small growths are found or if you have a family history or genetic risk for colorectal cancer, you may need to be screened more often.  Skin Cancer  Check your skin from head to toe regularly.  Monitor any moles. Be sure to tell your health care provider: ? About any new moles or changes in moles, especially if there is a change in a mole's shape or color. ? If you have a mole that is larger than the size of a pencil eraser.  If any of your family members has a history of skin cancer, especially at a young age, talk with your health care provider about genetic screening.  Always use sunscreen. Apply sunscreen liberally and repeatedly throughout the day.  Whenever you are outside, protect yourself by wearing long sleeves, pants, a wide-brimmed hat, and sunglasses.  What should I know about osteoporosis? Osteoporosis is a condition in which bone destruction happens more quickly than new bone creation. After menopause, you may be at an increased risk for osteoporosis. To help prevent osteoporosis or the bone fractures that can happen because of osteoporosis, the following is recommended:  If you are 5-2 years old, get at least 1,000 mg of calcium and at least 600 mg of vitamin D per day.  If you are older than  age 4 but younger than age 7, get at least 1,200 mg of calcium and at least 600 mg of vitamin D per day.  If you are older than age 61, get at least 1,200 mg of calcium and at least 800 mg of vitamin D per day.  Smoking and excessive alcohol intake increase the risk of osteoporosis. Eat foods that are rich in calcium and vitamin D, and do weight-bearing exercises several times each week as directed by your health care provider. What should I know about how menopause affects my mental health? Depression may occur at any age, but it is more common as you become older. Common symptoms of depression include:  Low or sad mood.  Changes in sleep patterns.  Changes in appetite or eating patterns.  Feeling an overall lack of motivation or enjoyment of activities that you previously enjoyed.  Frequent crying spells.  Talk with your health care provider if you think that you are experiencing depression. What should I know about immunizations? It is important that you get and maintain your immunizations. These include:  Tetanus, diphtheria, and pertussis (Tdap) booster vaccine.  Influenza every year before the flu season begins.  Pneumonia vaccine.  Shingles vaccine.  Your health care provider may also recommend other immunizations. This information is not intended to replace advice given to you by your health care provider. Make sure you discuss any questions you have with your health care provider. Document Released: 06/21/2005 Document Revised: 11/17/2015 Document Reviewed: 01/31/2015 Elsevier Interactive Patient Education  2018 [employer].

## 2017-07-23 NOTE — Progress Notes (Signed)
Patient ID: Kari [name], female    DOB: Apr 04, 1946  Age: 72 y.o. MRN: [id number]  The patient is here for follow up and management of other chronic and acute problems.    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   The risk factors are reflected in the social history.  The roster of all physicians providing medical care to patient - is listed in the Snapshot section of the chart.  Activities of daily living:  The patient is 100% independent in all ADLs: dressing, toileting, feeding as well as independent mobility  Home safety : The patient has smoke detectors in the home. They wear seatbelts.  There are no firearms at home. There is no violence in the home.   There is no risks for hepatitis, STDs or HIV. There is no   history of blood transfusion. They have no travel history to infectious disease endemic areas of the world.  The patient has seen their dentist in the last six month. They have seen their eye doctor in the last year. They admit to slight hearing difficulty with regard to whispered voices and some television programs.  They have deferred audiologic testing in the last year.  They do not  have excessive sun exposure. Discussed the need for sun protection: hats, long sleeves and use of sunscreen if there is significant sun exposure.   Diet: the importance of a healthy diet is discussed. They do have a healthy diet.  The benefits of regular aerobic exercise were discussed. She does not exercise.  gained 15 lbs after her oral surgery .Body mass index is 41.13 kg/m.   Depression screen: there are no signs or vegative symptoms of depression- irritability, change in appetite, anhedonia, sadness/tearfullness.  Cognitive assessment: the patient manages all their financial and personal affairs and is actively engaged.  They could relate day,date,year and events; recalled 2/3 objects at 3 minutes; performed clock-face test normally.  The following portions of the patient's history were reviewed and updated as appropriate: allergies, current medications, past family history, past medical history,  past surgical history, past social history  and problem list.  Visual acuity was not assessed per patient preference since she has regular follow up with her ophthalmologist. Hearing and body mass index were assessed and reviewed.   During the course of the visit the patient was educated and counseled about appropriate screening and preventive services including : fall prevention , diabetes screening, nutrition counseling, colorectal cancer screening, and recommended immunizations.    CC: The primary encounter diagnosis was Essential hypertension. Diagnoses of Hyperlipidemia LDL goal <130, Class 3 severe obesity without serious comorbidity with body mass index (BMI) of 40.0 to 44.9 in adult, unspecified obesity type ([place]), Impaired fasting glucose, Postmenopausal estrogen deficiency, Fatigue, unspecified type, and Screening for malignant neoplasm of breast were also pertinent to this visit.  Occasional insomnia 1-2/week brain overactive.  Some nights 1/month looking at clock every hour.  Has tried deep breathing , etc.   Does games on phones    History Vincent has a past medical history of Allergy, Arthritis, Asthma, Depression, GERD (gastroesophageal reflux disease), adenomatous colonic polyps (05/30/2010), Hyperlipidemia, Hypertension, Menopause, Migraines, and Miscarriage.   She has a past surgical history that includes Tubal ligation (1980); Cesarean section; and Dental surgery (09/2016).   Her family history includes Cancer (  age of onset: 20) in her sister; Colon cancer (age of onset: 63) in her sister; Coronary artery disease in her father; Heart attack in her paternal grandfather; Heart attack (age of onset: 47) in  her father; Myasthenia gravis in her mother; Valvular heart disease in her father.She reports that she quit smoking about 46 years ago. she has never used smokeless tobacco. She reports that she drinks alcohol. She reports that she does not use drugs.  Outpatient Medications Prior to Visit  Medication Sig Dispense Refill  . allopurinol (ZYLOPRIM) 300 MG tablet TAKE 1 TABLET BY MOUTH  DAILY 90 tablet 0  . aspirin 325 MG tablet Take 325 mg by mouth daily.      Marland Kitchen atorvastatin (LIPITOR) 10 MG tablet TAKE 1 TABLET BY MOUTH  DAILY 90 tablet 1  . cetirizine (ZYRTEC) 10 MG tablet Take 10 mg by mouth daily.      . colchicine 0.6 MG tablet Take 1 tablet (0.6 mg total) by mouth 2 (two) times daily. As needed for gout flare. 60 tablet 2  . diflorasone (PSORCON) 0.05 % cream Apply 1 application topically 2 (two) times daily. 30 g 5  . esomeprazole (NEXIUM) 20 MG capsule Take 20 mg by mouth daily at 12 noon.    . hydrochlorothiazide (HYDRODIURIL) 25 MG tablet TAKE 1 TABLET BY MOUTH  DAILY 90 tablet 1  . mirtazapine (REMERON) 45 MG tablet TAKE 1 TABLET BY MOUTH AT  BEDTIME 90 tablet 0  . montelukast (SINGULAIR) 10 MG tablet TAKE 1 TABLET BY MOUTH AT  BEDTIME 90 tablet 1  . OVER THE COUNTER MEDICATION Nasocort nasal spray    . Probiotic Product (PROBIOTIC PO) Take by mouth.    . ramipril (ALTACE) 10 MG capsule TAKE 1 CAPSULE BY MOUTH  DAILY 90 capsule 1  . traMADol (ULTRAM) 50 MG tablet Take 1 tablet (50 mg total) by mouth every 8 (eight) hours as needed. 90 tablet 1  . triamcinolone lotion (KENALOG) 0.1 % APPLY TO AFFECTED AREA(S)  TWO TIMES DAILY 180 mL 1  . diflorasone (PSORCON) 0.05 % ointment Apply topically daily. (Patient not taking: Reported on 07/23/2017) 30 g 6  . scopolamine (TRANSDERM-SCOP, 1.5 MG,) 1 MG/3DAYS Place 1 patch (1.5 mg total) onto the skin every 3 (three) days. (Patient not taking: Reported on 07/23/2017) 10 patch 12   No facility-administered medications prior to visit.     Review of  Systems   Patient denies headache, fevers, malaise, unintentional weight loss, skin rash, eye pain, sinus congestion and sinus pain, sore throat, dysphagia,  hemoptysis , cough, dyspnea, wheezing, chest pain, palpitations, orthopnea, edema, abdominal pain, nausea, melena, diarrhea, constipation, flank pain, dysuria, hematuria, urinary  Frequency, nocturia, numbness, tingling, seizures,  Focal weakness, Loss of consciousness,  Tremor, insomnia, depression, anxiety, and suicidal ideation.     Objective:  BP 120/72 (BP Location: Left Arm, Patient Position: Sitting, Cuff Size: Large)   Pulse 78   Temp 97.6 F (36.4 C) (Oral)   Resp 16   Ht 5\' 4"  (1.626 m)   Wt 239 lb 9.6 oz (108.7 kg)   SpO2 97%   BMI 41.13 kg/m   Physical Exam   General appearance: alert, cooperative and appears stated age Head: Normocephalic, without obvious abnormality, atraumatic Eyes: conjunctivae/corneas clear. PERRL, EOM's intact. Fundi benign. Ears: normal TM's and external ear canals both ears Nose: Nares normal. Septum midline. Mucosa normal. No drainage or sinus tenderness. Throat: lips, mucosa, and tongue normal; teeth and gums normal Neck:  no adenopathy, no carotid bruit, no JVD, supple, symmetrical, trachea midline and thyroid not enlarged, symmetric, no tenderness/mass/nodules Lungs: clear to auscultation bilaterally Breasts: normal appearance, no masses or tenderness Heart: regular rate and rhythm, S1, S2 normal, no murmur, click, rub or gallop Abdomen: soft, non-tender; bowel sounds normal; no masses,  no organomegaly Extremities: extremities normal, atraumatic, no cyanosis or edema Pulses: 2+ and symmetric Skin: Skin color, texture, turgor normal. No rashes or lesions Neurologic: Alert and oriented X 3, normal strength and tone. Normal symmetric reflexes. Normal coordination and gait.      Assessment & Plan:   Problem List Items Addressed This Visit    Essential hypertension - Primary    Well  controlled on current regimen. Renal function stable, no changes today.  Lab Results  Component Value Date   CREATININE 1.13 07/23/2017   Lab Results  Component Value Date   NA 138 07/23/2017   K 4.3 07/23/2017   CL 101 07/23/2017   CO2 29 07/23/2017         Hyperlipidemia LDL goal <130    LDL is at goal with generic Lipitor.  She still has elevated triglycerides, but they are improving with weight loss.  Continue low glycemic index diet. Will repeat in  6 months  Lab Results  Component Value Date   CHOL 160 07/23/2017   HDL 42.20 07/23/2017   LDLCALC 70 08/11/2013   LDLDIRECT 95.0 07/23/2017   TRIG 210.0 (H) 07/23/2017   CHOLHDL 4 07/23/2017   Lab Results  Component Value Date   ALT 28 07/23/2017   AST 25 07/23/2017   ALKPHOS 97 07/23/2017   BILITOT 0.6 07/23/2017             Relevant Orders   Lipid panel (Completed)   Impaired fasting glucose    Her random glucose is again elevated but not diagnostic of diabetes .  I recommend she follow a low glycemic index diet and particpate regularly in an aerobic  exercise activity.  We should check an A1c in 6 months.       Relevant Orders   Hemoglobin A1c (Completed)   Comprehensive metabolic panel (Completed)   Obesity, unspecified   Screening for malignant neoplasm of breast    Breast exam done,  Mammogram ordered       Other Visit Diagnoses    Postmenopausal estrogen deficiency       Relevant Orders   DG Bone Density   Fatigue, unspecified type       Relevant Orders   TSH (Completed)   CBC with Differential/Platelet (Completed)     .A total of 40 minutes was spent with patient more than half of which was spent in counseling patient on the above mentioned issues , reviewing and explaining recent labs and imaging studies done, and coordination of care.  I have discontinued Daryll Brod scopolamine. I am also having her start on diptheria-tetanus toxoids and Zoster Vaccine Adjuvanted. Additionally, I am  having her maintain her cetirizine, aspirin, OVER THE COUNTER MEDICATION, esomeprazole, Probiotic Product (PROBIOTIC PO), traMADol, colchicine, atorvastatin, montelukast, ramipril, hydrochlorothiazide, diflorasone, triamcinolone lotion, allopurinol, and mirtazapine.  Meds ordered this encounter  Medications  . diptheria-tetanus toxoids [facility]) 2-2 LF/0.5ML injection    Sig: Inject 0.5 mLs into the muscle once for 1 dose.    Dispense:  0.5 mL    Refill:  0  . Zoster Vaccine Adjuvanted [facility]) injection    Sig: Inject 0.5 mLs into the muscle once for 1 dose.  Dispense:  1 each    Refill:  1    Medications Discontinued During This Encounter  Medication Reason  . diflorasone (PSORCON) 4.26 % ointment Duplicate  . scopolamine (TRANSDERM-SCOP, 1.5 MG,) 1 MG/3DAYS Completed Course    Follow-up: Return in about 6 months (around 01/23/2018) for labs,  OV .   [name], MD

## 2017-07-24 NOTE — Assessment & Plan Note (Signed)
LDL is at goal with generic Lipitor.  She still has elevated triglycerides, but they are improving with weight loss.  Continue low glycemic index diet. Will repeat in  6 months  Lab Results  Component Value Date   CHOL 160 07/23/2017   HDL 42.20 07/23/2017   LDLCALC 70 08/11/2013   LDLDIRECT 95.0 07/23/2017   TRIG 210.0 (H) 07/23/2017   CHOLHDL 4 07/23/2017   Lab Results  Component Value Date   ALT 28 07/23/2017   AST 25 07/23/2017   ALKPHOS 97 07/23/2017   BILITOT 0.6 07/23/2017

## 2017-07-24 NOTE — Assessment & Plan Note (Signed)
Her random glucose is again elevated but not diagnostic of diabetes .  I recommend she follow a low glycemic index diet and particpate regularly in an aerobic  exercise activity.  We should check an A1c in 6 months.  

## 2017-07-24 NOTE — Assessment & Plan Note (Signed)
Well controlled on current regimen. Renal function stable, no changes today.  Lab Results  Component Value Date   CREATININE 1.13 07/23/2017   Lab Results  Component Value Date   NA 138 07/23/2017   K 4.3 07/23/2017   CL 101 07/23/2017   CO2 29 07/23/2017

## 2017-07-24 NOTE — Assessment & Plan Note (Signed)
Breast exam done,  Mammogram ordered

## 2017-10-15 ENCOUNTER — Other Ambulatory Visit: Payer: Self-pay | Admitting: Internal Medicine

## 2017-10-15 DIAGNOSIS — M109 Gout, unspecified: Secondary | ICD-10-CM

## 2017-10-23 ENCOUNTER — Other Ambulatory Visit: Payer: Medicare Other

## 2017-11-12 ENCOUNTER — Other Ambulatory Visit: Payer: Medicare Other

## 2017-12-10 ENCOUNTER — Ambulatory Visit
Admission: RE | Admit: 2017-12-10 | Discharge: 2017-12-10 | Disposition: A | Discharge status: Home / Self Care | Payer: Medicare Other | Source: Ambulatory Visit | Attending: Internal Medicine | Admitting: Internal Medicine

## 2017-12-10 DIAGNOSIS — M85851 Other specified disorders of bone density and structure, right thigh: Secondary | ICD-10-CM | POA: Diagnosis not present

## 2017-12-10 DIAGNOSIS — Z780 Asymptomatic menopausal state: Secondary | ICD-10-CM | POA: Diagnosis not present

## 2017-12-12 ENCOUNTER — Encounter: Payer: Self-pay | Admitting: Internal Medicine

## 2017-12-12 DIAGNOSIS — M810 Age-related osteoporosis without current pathological fracture: Secondary | ICD-10-CM

## 2017-12-12 DIAGNOSIS — Z780 Asymptomatic menopausal state: Secondary | ICD-10-CM | POA: Insufficient documentation

## 2018-02-25 DIAGNOSIS — Z23 Encounter for immunization: Secondary | ICD-10-CM | POA: Diagnosis not present

## 2018-04-06 ENCOUNTER — Telehealth: Payer: Self-pay | Admitting: Internal Medicine

## 2018-04-06 ENCOUNTER — Other Ambulatory Visit: Payer: Self-pay | Admitting: Internal Medicine

## 2018-04-06 DIAGNOSIS — R7301 Impaired fasting glucose: Secondary | ICD-10-CM

## 2018-04-06 DIAGNOSIS — M109 Gout, unspecified: Secondary | ICD-10-CM

## 2018-04-06 DIAGNOSIS — E785 Hyperlipidemia, unspecified: Secondary | ICD-10-CM

## 2018-04-06 NOTE — Telephone Encounter (Signed)
FASTING LABS NEEDED ASAP, MEDS REFILLED

## 2018-04-06 NOTE — Telephone Encounter (Signed)
LMTCB to office to schedule fasting labs asap!

## 2018-04-07 ENCOUNTER — Other Ambulatory Visit: Payer: Self-pay | Admitting: Internal Medicine

## 2018-04-07 NOTE — Telephone Encounter (Signed)
I have scheduled patient for fasting labs Thursday 04/17/18.

## 2018-04-16 ENCOUNTER — Other Ambulatory Visit (INDEPENDENT_AMBULATORY_CARE_PROVIDER_SITE_OTHER): Payer: Medicare Other

## 2018-04-16 DIAGNOSIS — E785 Hyperlipidemia, unspecified: Secondary | ICD-10-CM

## 2018-04-16 DIAGNOSIS — M109 Gout, unspecified: Secondary | ICD-10-CM | POA: Diagnosis not present

## 2018-04-16 DIAGNOSIS — R7301 Impaired fasting glucose: Secondary | ICD-10-CM

## 2018-04-16 LAB — COMPREHENSIVE METABOLIC PANEL
ALT: 24 U/L (ref 0–35)
AST: 27 U/L (ref 0–37)
Albumin: 4 g/dL (ref 3.5–5.2)
Alkaline Phosphatase: 90 U/L (ref 39–117)
BUN: 20 mg/dL (ref 6–23)
CHLORIDE: 98 meq/L (ref 96–112)
CO2: 27 meq/L (ref 19–32)
Calcium: 9.7 mg/dL (ref 8.4–10.5)
Creatinine, Ser: 1.26 mg/dL — ABNORMAL HIGH (ref 0.40–1.20)
GFR: 44.54 mL/min — ABNORMAL LOW (ref >60.00)
GLUCOSE: 126 mg/dL — AB (ref 70–99)
POTASSIUM: 4 meq/L (ref 3.5–5.1)
Sodium: 135 mEq/L (ref 135–145)
Total Bilirubin: 0.7 mg/dL (ref 0.2–1.2)
Total Protein: 7.1 g/dL (ref 6.0–8.3)

## 2018-04-16 LAB — LIPID PANEL
CHOL/HDL RATIO: 4
Cholesterol: 154 mg/dL (ref 0–200)
HDL: 37.8 mg/dL — AB (ref >39.00)
NONHDL: 115.84
Triglycerides: 246 mg/dL — ABNORMAL HIGH (ref 0.0–149.0)
VLDL: 49.2 mg/dL — AB (ref 0.0–40.0)

## 2018-04-16 LAB — LDL CHOLESTEROL, DIRECT: Direct LDL: 84 mg/dL

## 2018-04-16 LAB — URIC ACID: Uric Acid, Serum: 6.3 mg/dL (ref 2.4–7.0)

## 2018-04-16 LAB — HEMOGLOBIN A1C: Hgb A1c MFr Bld: 5.8 % (ref 4.6–6.5)

## 2018-04-22 ENCOUNTER — Other Ambulatory Visit: Payer: Self-pay | Admitting: Internal Medicine

## 2018-04-22 MED ORDER — MIRTAZAPINE 45 MG PO TABS: 45.0000 mg | ORAL_TABLET | Freq: Every day | ORAL | 0 refills | Status: DC

## 2018-04-22 NOTE — Telephone Encounter (Signed)
Medication refilled - patient notified.

## 2018-04-22 NOTE — Telephone Encounter (Signed)
Copied from [place] [id number]. Topic: Quick Communication - See Telephone Encounter >> Apr 22, 2018  4:32 PM Vernona Rieger wrote: CRM for notification. See Telephone encounter for: 04/22/18.  Pt is calling to see why her mirtazapine (REMERON) 45 MG tablet was denied. She said all her other medications were approved. It says responded to by other means but [facility] is still saying they can not fill it. Please contact patient. >> Apr 22, 2018  4:34 PM Ahmed Prima L wrote: Please contact her at her job [phone]

## 2018-04-22 NOTE — Telephone Encounter (Signed)
Copied from [place] [phone]. Topic: Quick Communication - See Telephone Encounter >> Apr 22, 2018  4:32 PM Vernona Rieger wrote: CRM for notification. See Telephone encounter for: 04/22/18.  Pt is calling to see why her mirtazapine (REMERON) 45 MG tablet was denied. She said all her other medications were approved. It says responded to by other means but [facility] is still saying they can not fill it. Please contact patient.

## 2018-05-29 ENCOUNTER — Ambulatory Visit: Payer: Medicare Other

## 2018-06-01 ENCOUNTER — Ambulatory Visit (INDEPENDENT_AMBULATORY_CARE_PROVIDER_SITE_OTHER): Payer: PPO

## 2018-06-01 VITALS — BP 130/64 | HR 80 | Temp 98.2°F | Resp 16 | Ht 64.0 in | Wt 231.8 lb

## 2018-06-01 DIAGNOSIS — Z0000 Encounter for general adult medical examination without abnormal findings: Secondary | ICD-10-CM | POA: Diagnosis not present

## 2018-06-01 NOTE — Patient Instructions (Addendum)
  Ms. Jezewski , Thank you for taking time to come for your Medicare Wellness Visit. I appreciate your ongoing commitment to your health goals. Please review the following plan we discussed and let me know if I can assist you in the future.   Contact [facility] regarding mammogram  These are the goals we discussed: Goals    . Increase physical activity     Use stationary bike Walk  Exercise 5 days, 30 minutes       This is a list of the screening recommended for you and due dates:  Health Maintenance  Topic Date Due  . Mammogram  04/15/2015  . Tetanus Vaccine  03/03/2018  . Colon Cancer Screening  11/23/2019  . Flu Shot  Completed  . DEXA scan (bone density measurement)  Completed  .  Hepatitis C: One time screening is recommended by Center for Disease Control  (CDC) for  adults born from [birth year] through 1963.   Completed  . Pneumonia vaccines  Completed

## 2018-06-01 NOTE — Progress Notes (Addendum)
Subjective:   Kari [name] is a 73 y.o. female who presents for Medicare Annual (Subsequent) preventive examination.  Review of Systems:  No ROS.  Medicare Wellness Visit. Additional risk factors are reflected in the social history. Cardiac Risk Factors include: advanced age (>33men, >36 women);hypertension     Objective:     Vitals: BP 130/64 (BP Location: Left Arm, Patient Position: Sitting, Cuff Size: Normal)   Pulse 80   Temp 98.2 F (36.8 C) (Oral)   Resp 16   Ht 5\' 4"  (1.626 m)   Wt 231 lb 12.8 oz (105.1 kg)   SpO2 97%   BMI 39.79 kg/m   Body mass index is 39.79 kg/m.  Advanced Directives 06/01/2018 05/28/2017 11/09/2014  Does Patient Have a Medical Advance Directive? Yes Yes Yes  Type of Paramedic of [place];Living will [place];Living will [place];Living will  Does patient want to make changes to medical advance directive? No - Patient declined No - Patient declined No - Patient declined  Copy of [place] in Chart? No - copy requested No - copy requested No - copy requested    Tobacco Social History   Tobacco Use  Smoking Status Former Smoker  . Last attempt to quit: 01/02/1971  . Years since quitting: 47.4  Smokeless Tobacco Never Used     Counseling given: Not Answered   Clinical Intake:  Pre-visit preparation completed: Yes  Pain : No/denies pain     Diabetes: No  How often do you need to have someone help you when you read instructions, pamphlets, or other written materials from your doctor or pharmacy?: 1 - Never  Interpreter Needed?: No     Past Medical History:  Diagnosis Date  . Allergy    to cat, dogs, pollen, dust, cigarette smoke  . Arthritis    feet  . Asthma   . Depression   . GERD (gastroesophageal reflux disease)   . Hx of adenomatous colonic polyps 05/30/2010  . Hyperlipidemia   . Hypertension   . Menopause   . Migraines   .  Miscarriage    X 1   Past Surgical History:  Procedure Laterality Date  . CESAREAN SECTION     X 2  . DENTAL SURGERY  09/2016  . TUBAL LIGATION  1980   Family History  Problem Relation Age of Onset  . Myasthenia gravis Mother   . Coronary artery disease Father   . Valvular heart disease Father   . Heart attack Father 29  . Cancer Sister 71       colon  . Colon cancer Sister 19  . Heart attack Paternal Grandfather        40's   Social History   Socioeconomic History  . Marital status: Married    Spouse name: Not on file  . Number of children: Not on file  . Years of education: Not on file  . Highest education level: Not on file  Occupational History  . Not on file  Social Needs  . Financial resource strain: Not hard at all  . Food insecurity:    Worry: Never true    Inability: Never true  . Transportation needs:    Medical: No    Non-medical: No  Tobacco Use  . Smoking status: Former Smoker    Last attempt to quit: 01/02/1971    Years since quitting: 47.4  . Smokeless tobacco: Never Used  Substance and Sexual Activity  .  Alcohol use: Yes    Alcohol/week: 0.0 standard drinks    Comment: occasional- wine gives pt headache only drinks once every 6 months  . Drug use: No  . Sexual activity: Not Currently  Lifestyle  . Physical activity:    Days per week: 0 days    Minutes per session: Not on file  . Stress: Only a little  Relationships  . Social connections:    Talks on phone: Not on file    Gets together: Not on file    Attends religious service: Not on file    Active member of club or organization: Not on file    Attends meetings of clubs or organizations: Not on file    Relationship status: Married  Other Topics Concern  . Not on file  Social History Narrative  . Not on file    Outpatient Encounter Medications as of 06/01/2018  Medication Sig  . allopurinol (ZYLOPRIM) 300 MG tablet TAKE 1 TABLET BY MOUTH  DAILY  . aspirin 325 MG tablet Take 325 mg  by mouth daily.    Marland Kitchen atorvastatin (LIPITOR) 10 MG tablet TAKE 1 TABLET BY MOUTH  DAILY  . cetirizine (ZYRTEC) 10 MG tablet Take 10 mg by mouth daily.    . colchicine 0.6 MG tablet Take 1 tablet (0.6 mg total) by mouth 2 (two) times daily. As needed for gout flare.  . diflorasone (PSORCON) 0.05 % cream Apply 1 application topically 2 (two) times daily.  Marland Kitchen esomeprazole (NEXIUM) 20 MG capsule Take 20 mg by mouth daily at 12 noon.  . hydrochlorothiazide (HYDRODIURIL) 25 MG tablet TAKE 1 TABLET BY MOUTH  DAILY  . mirtazapine (REMERON) 45 MG tablet Take 1 tablet (45 mg total) by mouth at bedtime.  . montelukast (SINGULAIR) 10 MG tablet TAKE 1 TABLET BY MOUTH AT  BEDTIME  . OVER THE COUNTER MEDICATION Nasocort nasal spray  . Probiotic Product (PROBIOTIC PO) Take by mouth.  . ramipril (ALTACE) 10 MG capsule TAKE 1 CAPSULE BY MOUTH  DAILY  . traMADol (ULTRAM) 50 MG tablet Take 1 tablet (50 mg total) by mouth every 8 (eight) hours as needed.  . triamcinolone lotion (KENALOG) 0.1 % APPLY TO AFFECTED AREA(S)  TWO TIMES DAILY  . [DISCONTINUED] mometasone (NASONEX) 50 MCG/ACT nasal spray Place 2 sprays into the nose daily.   No facility-administered encounter medications on file as of 06/01/2018.     Activities of Daily Living In your present state of health, do you have any difficulty performing the following activities: 06/01/2018  Hearing? Y  Vision? N  Difficulty concentrating or making decisions? N  Walking or climbing stairs? Y  Dressing or bathing? N  Doing errands, shopping? N  Preparing Food and eating ? N  Using the Toilet? N  In the past six months, have you accidently leaked urine? N  Do you have problems with loss of bowel control? N  Managing your Medications? N  Managing your Finances? N  Housekeeping or managing your Housekeeping? N  Some recent data might be hidden    Patient Care Team: [name], MD as PCP - General (Internal Medicine)    Assessment:   This is a  routine wellness examination for Kari [name].  Health Screenings  Mammogram 04/14/13 Colonoscopy - 11/23/14 Bone Density -12/10/17 Glaucoma- none reported Hearing -passes the whisper test Hemoglobin A1C 04/16/18 (5.8) Cholesterol -04/16/18 (154) Dental- every 6 months Vision- Last OV 04/2018  Social  Alcohol intake yes, 1 per week  Smoking history-  former Smokers in home? none Illicit drug use? none Exercise none. She plans to start walking and use her stationary bike  Diet -regular Sexually Active -not currently  Safety  Patient feels safe at home.  Patient does have smoke detectors at home  Patient does wear sunscreen or protective clothing when in direct sunlight  Patient does wear seat belt when driving or riding with others.   Activities of Daily Living Patient can do their own household chores. Denies needing assistance with: driving, feeding themselves, getting from bed to chair, getting to the toilet, bathing/showering, dressing, managing money, climbing flight of stairs, or preparing meals.   Depression Screen Patient denies losing interest in daily life, feeling hopeless, or crying easily over simple problems.   Fall Screen Patient denies being afraid of falling or falling in the last year.   Memory Screen Patient denies problems with memory, misplacing items, and is able to balance checkbook/bank accounts.  Patient is alert, normal appearance, oriented to person/place/and time. Correctly identified the president of the [place], recall of 2/3 objects, and performing simple calculations.  Patient displays appropriate judgement and can read correct time from watch face.   Immunizations The following Immunizations are up to date: Influenza, and pneumonia. Shingles and TDAP discussed.   Other Providers Patient Care Team: [name], MD as PCP - General (Internal Medicine)  Exercise Activities and Dietary recommendations Current Exercise Habits: The patient does not  participate in regular exercise at present, Intensity: Mild  Goals    . Increase physical activity     Use stationary bike Walk  Exercise 5 days, 30 minutes       Fall Risk Fall Risk  06/01/2018 05/28/2017 07/04/2016 09/13/2014  Falls in the past year? 0 Yes No No  Number falls in past yr: - 1 - -  Injury with Fall? - No - -   Depression Screen PHQ 2/9 Scores 06/01/2018 05/28/2017 09/13/2014  PHQ - 2 Score 0 0 2  PHQ- 9 Score - - 4     Cognitive Function MMSE - Mini Mental State Exam 05/28/2017  Orientation to time 5  Orientation to Place 5  Registration 3  Attention/ Calculation 5  Recall 3  Language- name 2 objects 2  Language- repeat 1  Language- follow 3 step command 3  Language- read & follow direction 1  Write a sentence 1  Copy design 1  Total score 30     6CIT Screen 06/01/2018  What Year? 0 points  What month? 0 points  What time? 0 points  Count back from 20 0 points  Months in reverse 0 points  Repeat phrase 0 points  Total Score 0    Immunization History  Administered Date(s) Administered  . Influenza, High Dose Seasonal PF 02/24/2018  . Influenza,inj,Quad PF,6+ Mos 03/03/2013  . Influenza-Unspecified 01/11/2014, 02/13/2015, 02/27/2016, 03/04/2017  . Pneumococcal Conjugate-13 03/03/2013  . Pneumococcal Polysaccharide-23 08/14/2015  . Tdap 03/03/2008   Screening Tests Health Maintenance  Topic Date Due  . MAMMOGRAM  04/15/2015  . TETANUS/TDAP  03/03/2018  . COLONOSCOPY  11/23/2019  . INFLUENZA VACCINE  Completed  . DEXA SCAN  Completed  . Hepatitis C Screening  Completed  . PNA vac Low Risk Adult  Completed       Plan:    End of life planning; Advance aging; Advanced directives discussed. Copy of current HCPOA/Living Will requested.    Schedule mammogram  I have personally reviewed and noted the following in the patient's chart:   .  Medical and social history . Use of alcohol, tobacco or illicit drugs  . Current medications and  supplements . Functional ability and status . Nutritional status . Physical activity . Advanced directives . List of other physicians . Hospitalizations, surgeries, and ER visits in previous 12 months . Vitals . Screenings to include cognitive, depression, and falls . Referrals and appointments  In addition, I have reviewed and discussed with patient certain preventive protocols, quality metrics, and best practice recommendations. A written personalized care plan for preventive services as well as general preventive health recommendations were provided to patient.     OBrien-Blaney, Jaidyn Kuhl L, LPN  3/78/5885     I have reviewed the above information and agree with above.   Deborra Medina, MD

## 2018-06-10 ENCOUNTER — Encounter: Payer: Self-pay | Admitting: Internal Medicine

## 2018-06-10 ENCOUNTER — Ambulatory Visit (INDEPENDENT_AMBULATORY_CARE_PROVIDER_SITE_OTHER): Payer: PPO | Admitting: Internal Medicine

## 2018-06-10 VITALS — BP 132/72 | HR 82 | Temp 98.1°F | Resp 15 | Ht 64.0 in | Wt 232.4 lb

## 2018-06-10 DIAGNOSIS — Z780 Asymptomatic menopausal state: Secondary | ICD-10-CM

## 2018-06-10 DIAGNOSIS — Z1239 Encounter for other screening for malignant neoplasm of breast: Secondary | ICD-10-CM

## 2018-06-10 DIAGNOSIS — M109 Gout, unspecified: Secondary | ICD-10-CM | POA: Diagnosis not present

## 2018-06-10 DIAGNOSIS — I10 Essential (primary) hypertension: Secondary | ICD-10-CM | POA: Diagnosis not present

## 2018-06-10 DIAGNOSIS — E119 Type 2 diabetes mellitus without complications: Secondary | ICD-10-CM | POA: Diagnosis not present

## 2018-06-10 DIAGNOSIS — M810 Age-related osteoporosis without current pathological fracture: Secondary | ICD-10-CM | POA: Diagnosis not present

## 2018-06-10 DIAGNOSIS — Z86010 Personal history of colonic polyps: Secondary | ICD-10-CM

## 2018-06-10 LAB — MICROALBUMIN / CREATININE URINE RATIO
Creatinine,U: 65.7 mg/dL
MICROALB/CREAT RATIO: 1.1 mg/g (ref 0.0–30.0)
Microalb, Ur: <0.7 mg/dL (ref 0.0–1.9)

## 2018-06-10 NOTE — Progress Notes (Signed)
Subjective:  Patient ID: Kari [name], female    DOB: 1946/01/19  Age: 73 y.o. MRN: [id number]  CC: The primary encounter diagnosis was Breast cancer screening. Diagnoses of Type 2 diabetes mellitus without complication, without long-term current use of insulin (HCC), Gouty arthritis of toe of right foot, Osteopenia after menopause, Essential hypertension, Hx of adenomatous colonic polyps, and Diabetes mellitus without complication ([place]) were also pertinent to this visit.  HPI [name] presents for follow up on  multiple  Chronic medical conditions including essential hypertension, hyperlipidemia, prediabetes, GERD,  Gout and  and depression.  Last seen in March 2019.  Wellness visit last week with [name].  Recent labs indicate she now has T2DM Fasting glucose was 126,   Mammogram Jun 25 2017  [facility] normal  Colonoscopy July 2016  5 yr [facility] up [facility] to [facility]  DEXA July 2019: T score -1.8 femur    Outpatient Medications Prior to Visit  Medication Sig Dispense Refill  . aspirin 325 MG tablet Take 325 mg by mouth daily.      . cetirizine (ZYRTEC) 10 MG tablet Take 10 mg by mouth daily.      . colchicine 0.6 MG tablet Take 1 tablet (0.6 mg total) by mouth 2 (two) times daily. As needed for gout flare. 60 tablet 2  . diflorasone (PSORCON) 0.05 % cream Apply 1 application topically 2 (two) times daily. 30 g 5  . esomeprazole (NEXIUM) 20 MG capsule Take 20 mg by mouth daily at 12 noon.    Marland Kitchen OVER THE COUNTER MEDICATION Nasocort nasal spray    . Probiotic Product (PROBIOTIC PO) Take by mouth.    Marland Kitchen allopurinol (ZYLOPRIM) 300 MG tablet TAKE 1 TABLET BY MOUTH  DAILY 90 tablet 1  . atorvastatin (LIPITOR) 10 MG tablet TAKE 1 TABLET BY MOUTH  DAILY 90 tablet 1  . hydrochlorothiazide (HYDRODIURIL) 25 MG tablet TAKE 1 TABLET BY MOUTH  DAILY 90 tablet 1  . mirtazapine (REMERON) 45 MG tablet Take 1 tablet (45 mg total) by mouth at bedtime. 90 tablet 0  . montelukast (SINGULAIR) 10 MG tablet TAKE 1 TABLET  BY MOUTH AT  BEDTIME 90 tablet 1  . ramipril (ALTACE) 10 MG capsule TAKE 1 CAPSULE BY MOUTH  DAILY 90 capsule 1  . triamcinolone lotion (KENALOG) 0.1 % APPLY TO AFFECTED AREA(S)  TWO TIMES DAILY 180 mL 1  . traMADol (ULTRAM) 50 MG tablet Take 1 tablet (50 mg total) by mouth every 8 (eight) hours as needed. (Patient not taking: Reported on 06/10/2018) 90 tablet 1   No facility-administered medications prior to visit.     Review of Systems;  Patient denies headache, fevers, malaise, unintentional weight loss, skin rash, eye pain, sinus congestion and sinus pain, sore throat, dysphagia,  hemoptysis , cough, dyspnea, wheezing, chest pain, palpitations, orthopnea, edema, abdominal pain, nausea, melena, diarrhea, constipation, flank pain, dysuria, hematuria, urinary  Frequency, nocturia, numbness, tingling, seizures,  Focal weakness, Loss of consciousness,  Tremor, insomnia, depression, anxiety, and suicidal ideation.      Objective:  BP 132/72 (BP Location: Left Arm, Patient Position: Sitting, Cuff Size: Large)   Pulse 82   Temp 98.1 F (36.7 C) (Oral)   Resp 15   Ht 5\' 4"  (1.626 m)   Wt 232 lb 6.4 oz (105.4 kg)   SpO2 97%   BMI 39.89 kg/m   BP Readings from Last 3 Encounters:  06/10/18 132/72  06/01/18 130/64  07/23/17 120/72    Wt Readings from  Last 3 Encounters:  06/10/18 232 lb 6.4 oz (105.4 kg)  06/01/18 231 lb 12.8 oz (105.1 kg)  07/23/17 239 lb 9.6 oz (108.7 kg)    General appearance: alert, cooperative and appears stated age Ears: normal TM's and external ear canals both ears Throat: lips, mucosa, and tongue normal; teeth and gums normal Neck: no adenopathy, no carotid bruit, supple, symmetrical, trachea midline and thyroid not enlarged, symmetric, no tenderness/mass/nodules Back: symmetric, no curvature. ROM normal. No CVA tenderness. Lungs: clear to auscultation bilaterally Heart: regular rate and rhythm, S1, S2 normal, no murmur, click, rub or gallop Abdomen: soft,  non-tender; bowel sounds normal; no masses,  no organomegaly Pulses: 2+ and symmetric Skin: Skin color, texture, turgor normal. No rashes or lesions Lymph nodes: Cervical, supraclavicular, and axillary nodes normal. Foot exam:  Nails are well trimmed,  No callouses,  Sensation intact to microfilament  Dg Bone Density  Result Date: 12/10/2017 EXAM: DUAL X-RAY ABSORPTIOMETRY (DXA) FOR BONE MINERAL DENSITY IMPRESSION: Dear Dr [name], Your patient Ajanay Farve completed a BMD test on 12/10/2017 using the [employer] (analysis version: 14.10) manufactured by [employer]. The following summarizes the results of our evaluation. PATIENT BIOGRAPHICAL: Name: Kari [name] Patient ID:  [id number] Birth Date: Nov 06, 1945 Height:     63.0 in.     Weight:     233.8 lbs. Gender:      Female Exam Date:  12/10/2017 Indications: Advanced Age, Asthma, Caucasian, Height Loss, Osteoarthritis, Postmenopausal, Vitamin D Deficiency Fractures: Treatments: Calcium, Multi-Vitamin, Nexium, Singulair, ZYRTEC ASSESSMENT: The BMD measured at Femur Neck Right is 0.782 g/cm2 with a T-score of -1.8. This patient is considered OSTEOPENIC according to [facility]) criteria. Lumbar spine was not utilized due to advanced degenerative changes. Site Region Measured Measured WHO Young Adult BMD Date       Age      Classification T-score DualFemur Neck Right 12/10/2017 70.2 Osteopenia -1.8 0.782 g/cm2 Left Forearm Radius 33% 12/10/2017 70.2 Normal -0.1 0.871 g/cm2 World Health Organization [facility]) criteria for post-menopausal, Caucasian Women: Normal:       T-score at or above -1 SD Osteopenia:   T-score between -1 and -2.5 SD Osteoporosis: T-score at or below -2.5 SD RECOMMENDATIONS: 1. All patients should optimize calcium and vitamin D intake. 2. Consider FDA-approved medical therapies in postmenopausal women and men aged 55 years and older, based on the following: a. A hip or vertebral(clinical or morphometric)  fracture b. T-score < -2.5 at the femoral neck or spine after appropriate evaluation to exclude secondary causes c. Low bone mass (T-score between -1.0 and -2.5 at the femoral neck or spine) and a 10-year probability of a hip fracture > 3% or a 10-year probability of a major osteoporosis-related fracture > 20% based on the [place]-adapted WHO algorithm d. Clinician judgment and/or patient preferences may indicate treatment for people with 10-year fracture probabilities above or below these levels FOLLOW-UP: People with diagnosed cases of osteoporosis or at high risk for fracture should have regular bone mineral density tests. For patients eligible for Medicare, routine testing is allowed once every 2 years. The testing frequency can be increased to one year for patients who have rapidly progressing disease, those who are receiving or discontinuing medical therapy to restore bone mass, or have additional risk factors. I have reviewed this report, and agree with the above findings. Mark A. Thornton Papas, M.D. [facility] Dear Dr [name], Your patient Cameren Odwyer completed a FRAX assessment on 12/10/2017 using the [place]  System (analysis version: 14.10) manufactured by [employer]. The following summarizes the results of our evaluation. PATIENT BIOGRAPHICAL: Name: Shanikqua, Zarzycki Patient ID: [id number] Birth Date: 02-13-1946 Height:    63.0 in. Gender:     Female    Age:        72.2       Weight:    233.8 lbs. Ethnicity:  White                            Exam Date: 12/10/2017 FRAX* RESULTS:  (version: 3.5) 10-year Probability of Fracture1 Major Osteoporotic Fracture2 Hip Fracture 9.7% 1.6% Population: [place] (Caucasian) Risk Factors: None Based on Femur (Right) Neck BMD 1 -The 10-year probability of fracture may be lower than reported if the patient has received treatment. 2 -Major Osteoporotic Fracture: Clinical Spine, Forearm, Hip or Shoulder *FRAX is a Materials engineer of the [employer] of [employer] for Metabolic Bone Disease, a [place] (WHO) [facility]. ASSESSMENT: The probability of a major osteoporotic fracture is 9.7% within the next ten years. The probability of a hip fracture is 1.6% within the next ten years. I have reviewed this report and agree with the above findings. Mark A. Thornton Papas, M.D. [facility] Electronically Signed   By: Lavonia Dana M.D.   On: 12/10/2017 13:45    Assessment & Plan:   Problem List Items Addressed This Visit    Diabetes mellitus without complication ([place])     Recent fasting  glucose is elevated and  diagnostic of diabetes.  Based on her  A1c,   SHE DOES NOT need medications at this time.  She was encouraged to schedule an annual eye exam and to make  lifestyle changes including low glycemic index diet , weight loss (goal BMI < 30) , participate regularly in aerobic  Exercise, and to follow up in 6 months.  Lab Results  Component Value Date   HGBA1C 5.8 04/16/2018   Lab Results  Component Value Date   MICROALBUR <0.7 06/10/2018  .      Relevant Medications   atorvastatin (LIPITOR) 10 MG tablet   Essential hypertension    Well controlled on current regimen. Renal function stable, no changes today.  Lab Results  Component Value Date   CREATININE 1.26 (H) 04/16/2018   Lab Results  Component Value Date   NA 135 04/16/2018   K 4.0 04/16/2018   CL 98 04/16/2018   CO2 27 04/16/2018         Relevant Medications   atorvastatin (LIPITOR) 10 MG tablet   hydrochlorothiazide (HYDRODIURIL) 25 MG tablet   Gouty arthritis of toe of right foot   Relevant Medications   allopurinol (ZYLOPRIM) 300 MG tablet   Hx of adenomatous colonic polyps    5 YR FOLLOW UP COLONOSCOPY DUE IN 2021      Osteopenia after menopause    Bone Density scores reviewed from July 2019; she has osteopenia,  Moderate.  WILL repeat in 2 years and consider therapy then if there is a significant change. Continue calcium, vitamin d and  weight bearing exercise on a regular basis.        Other Visit Diagnoses    Breast cancer screening    -  Primary   Relevant Orders   MM DIGITAL SCREENING BILATERAL   Type 2 diabetes mellitus without complication, without long-term current use of insulin (HCC)       Relevant Medications  atorvastatin (LIPITOR) 10 MG tablet   Other Relevant Orders   Microalbumin / creatinine urine ratio (Completed)      I have discontinued Murray Hodgkins Mikles's traMADol. I have also changed her allopurinol, atorvastatin, hydrochlorothiazide, and montelukast. Additionally, I am having her maintain her cetirizine, aspirin, OVER THE COUNTER MEDICATION, esomeprazole, Probiotic Product (PROBIOTIC PO), colchicine, diflorasone, and mirtazapine.  Meds ordered this encounter  Medications  . allopurinol (ZYLOPRIM) 300 MG tablet    Sig: Take 1 tablet (300 mg total) by mouth daily.    Dispense:  90 tablet    Refill:  1  . atorvastatin (LIPITOR) 10 MG tablet    Sig: Take 1 tablet (10 mg total) by mouth daily.    Dispense:  90 tablet    Refill:  1  . hydrochlorothiazide (HYDRODIURIL) 25 MG tablet    Sig: Take 1 tablet (25 mg total) by mouth daily.    Dispense:  90 tablet    Refill:  1  . mirtazapine (REMERON) 45 MG tablet    Sig: Take 1 tablet (45 mg total) by mouth at bedtime.    Dispense:  90 tablet    Refill:  1  . montelukast (SINGULAIR) 10 MG tablet    Sig: Take 1 tablet (10 mg total) by mouth at bedtime.    Dispense:  90 tablet    Refill:  1    Medications Discontinued During This Encounter  Medication Reason  . traMADol (ULTRAM) 50 MG tablet Patient has not taken in last 30 days  . allopurinol (ZYLOPRIM) 300 MG tablet Reorder  . atorvastatin (LIPITOR) 10 MG tablet Reorder  . hydrochlorothiazide (HYDRODIURIL) 25 MG tablet Reorder  . mirtazapine (REMERON) 45 MG tablet Reorder  . montelukast (SINGULAIR) 10 MG tablet Reorder   A total of 40 minutes was spent with patient more than half of which was  spent in counseling patient on the above mentioned issues , reviewing and explaining recent labs and imaging studies done, and coordination of care. Follow-up: Return in about 6 months (around 12/09/2018).   [name], MD

## 2018-06-10 NOTE — Patient Instructions (Addendum)
Your have been diagnosed with type 2 DM based on your fasting glucose of 126.  However,  You do not need medications at this point.  You need LIFESTYLE CHANGES  THAT INCLUDE LOW GLYCEMIC INDEX DIET AND REGULAR EXERCISE   Low carb Breakfast options:  Premier Protein  Atkins Advantage Muscle Milk EAS AdvantEdge   All of these are available at [employer], Vladimir Faster,  [place] a And taste good   [place] Clap "D'Light" frozen entrees:    Frittata  : muffin shaped quiches that contain eggs + cheese + veggies+ meat (no bread) Egg'wich :  [place] sausage patty served on a biscuit made of frittat (no bread)   Both can be microwaved in 2 minutes   Truett Perna:  Just Crack n Egg: yogurt sized cup ith diced ham, cheese and potatoes: add one egg and microwave for 2 minutes   For lunch:   [employer] bagged salads:  Try  the Asian, the SouthWestern,  And the Caesar salads, complete with dressings, nuts and croutons .  Found  in the bagged salad section   Just add a protein (tuna,  Chicken etc ) and omit the won ton /tortilla strips    To make a low carb chip :  Take the Joseph's Lavash or Pita bread,  Or the [place] Low carb whole wheat tortilla   Place on metal cookie sheet  Brush with olive oil  Sprinkle garlic powder (NOT garlic salt), grated parmesan cheese, mediterranean seasoning , or all of them?  Bake at 275 for 30 minutes   We have substitutions for your potatoes!!  Try the mashed cauliflower and riced cauliflower dishes instead of rice and mashed potatoes  Mashed turnips are also very low carb!   For desserts :  Try the Dannon Lt n Fit greek yogurt dessert flavors and top with reddi Whip .  8 carbs,  80 calories  Try Oikos Triple Zero [place] Yogurt in the salted caramel, and the coffee flavors  With Whipped Cream for dessert  breyer's low carb ice cream, available in bars (on a stick, better ) or scoopable ice cream  HERE ARE THE LOW CARB  BREAD CHOICES           Diabetes Mellitus and Standards of Medical Care Managing diabetes (diabetes mellitus) can be complicated. Your diabetes treatment may be managed by a team of health care providers, including:  A physician who specializes in diabetes (endocrinologist).  A nurse practitioner or physician assistant.  Nurses.  A diet and nutrition specialist (registered dietitian).  A certified diabetes educator (CDE).  An exercise specialist.  A pharmacist.  An eye doctor.  A foot specialist (podiatrist).  A dentist.  A primary care provider.  A mental health provider. Your health care providers follow guidelines to help you get the best quality of care. The following schedule is a general guideline for your diabetes management plan. Your health care providers may give you more specific instructions. Physical exams Upon being diagnosed with diabetes mellitus, and each year after that, your health care provider will ask about your medical and family history. He or she will also do a physical exam. Your exam may include:  Measuring your height, weight, and body mass index (BMI).  Checking your blood pressure. This will be done at every routine medical visit. Your target blood pressure may vary depending on your medical conditions, your age, and other factors.  Thyroid gland exam.  Skin exam.  Screening for damage to your nerves (peripheral neuropathy). This may include checking the pulse in your legs and feet and checking the level of sensation in your hands and feet.  A complete foot exam to inspect the structure and skin of your feet, including checking for cuts, bruises, redness, blisters, sores, or other problems.  Screening for blood vessel (vascular) problems, which may include checking the pulse in your legs and feet and checking your temperature. Blood tests Depending on your treatment plan and your personal needs, you may have the following tests done:  HbA1c  (hemoglobin A1c). This test provides information about blood sugar (glucose) control over the previous 2-3 months. It is used to adjust your treatment plan, if needed. This test will be done: ? At least 2 times a year, if you are meeting your treatment goals. ? 4 times a year, if you are not meeting your treatment goals or if treatment goals have changed.  Lipid testing, including total, LDL, and HDL cholesterol and triglyceride levels. ? The goal for LDL is less than 100 mg/dL (5.5 mmol/L). If you are at high risk for complications, the goal is less than 70 mg/dL (3.9 mmol/L). ? The goal for HDL is 40 mg/dL (2.2 mmol/L) or higher for men and 50 mg/dL (2.8 mmol/L) or higher for women. An HDL cholesterol of 60 mg/dL (3.3 mmol/L) or higher gives some protection against heart disease. ? The goal for triglycerides is less than 150 mg/dL (8.3 mmol/L).  Liver function tests.  Kidney function tests.  Thyroid function tests. Dental and eye exams  Visit your dentist two times a year.  If you have type 1 diabetes, your health care provider may recommend an eye exam 3-5 years after you are diagnosed, and then once a year after your first exam. ? For children with type 1 diabetes, a health care provider may recommend an eye exam when your child is age 67 or older and has had diabetes for 3-5 years. After the first exam, your child should get an eye exam once a year.  If you have type 2 diabetes, your health care provider may recommend an eye exam as soon as you are diagnosed, and then once a year after your first exam. Immunizations   The yearly flu (influenza) vaccine is recommended for everyone 6 months or older who has diabetes.  The pneumonia (pneumococcal) vaccine is recommended for everyone 2 years or older who has diabetes. If you are 51 or older, you may get the pneumonia vaccine as a series of two separate shots.  The hepatitis B vaccine is recommended for adults shortly after being  diagnosed with diabetes.  Adults and children with diabetes should receive all other vaccines according to age-specific recommendations from the Centers for Disease Control and Prevention (CDC). Mental and emotional health Screening for symptoms of eating disorders, anxiety, and depression is recommended at the time of diagnosis and afterward as needed. If your screening shows that you have symptoms (positive screening result), you may need more evaluation and you may work with a mental health care provider. Treatment plan Your treatment plan will be reviewed at every medical visit. You and your health care provider will discuss:  How you are taking your medicines, including insulin.  Any side effects you are experiencing.  Your blood glucose target goals.  The frequency of your blood glucose monitoring.  Lifestyle habits, such as activity level as well as tobacco, alcohol, and substance use. Diabetes self-management education Your health care  provider will assess how well you are monitoring your blood glucose levels and whether you are taking your insulin correctly. He or she may refer you to:  A certified diabetes educator to manage your diabetes throughout your life, starting at diagnosis.  A registered dietitian who can create or review your personal nutrition plan.  An exercise specialist who can discuss your activity level and exercise plan. Summary  Managing diabetes (diabetes mellitus) can be complicated. Your diabetes treatment may be managed by a team of health care providers.  Your health care providers follow guidelines in order to help you get the best quality of care.  Standards of care including having regular physical exams, blood tests, blood pressure monitoring, immunizations, screening tests, and education about how to manage your diabetes.  Your health care providers may also give you more specific instructions based on your individual health. This information is  not intended to replace advice given to you by your health care provider. Make sure you discuss any questions you have with your health care provider. Document Released: 02/24/2009 Document Revised: 01/16/2018 Document Reviewed: 01/26/2016 Elsevier Interactive Patient Education  2019 [employer].

## 2018-06-12 ENCOUNTER — Other Ambulatory Visit: Payer: Self-pay | Admitting: Internal Medicine

## 2018-06-12 MED ORDER — HYDROCHLOROTHIAZIDE 25 MG PO TABS: 25.0000 mg | ORAL_TABLET | Freq: Every day | ORAL | 1 refills | Status: DC

## 2018-06-12 MED ORDER — ALLOPURINOL 300 MG PO TABS: 300.0000 mg | ORAL_TABLET | Freq: Every day | ORAL | 1 refills | Status: DC

## 2018-06-12 MED ORDER — TRIAMCINOLONE ACETONIDE 0.1 % EX LOTN: TOPICAL_LOTION | CUTANEOUS | 1 refills | Status: DC

## 2018-06-12 MED ORDER — MIRTAZAPINE 45 MG PO TABS: 45.0000 mg | ORAL_TABLET | Freq: Every day | ORAL | 1 refills | Status: DC

## 2018-06-12 MED ORDER — RAMIPRIL 10 MG PO CAPS: 10.0000 mg | ORAL_CAPSULE | Freq: Every day | ORAL | 1 refills | Status: DC

## 2018-06-12 MED ORDER — ATORVASTATIN CALCIUM 10 MG PO TABS: 10.0000 mg | ORAL_TABLET | Freq: Every day | ORAL | 1 refills | Status: DC

## 2018-06-12 MED ORDER — MONTELUKAST SODIUM 10 MG PO TABS: 10.0000 mg | ORAL_TABLET | Freq: Every day | ORAL | 1 refills | Status: DC

## 2018-06-12 NOTE — Telephone Encounter (Signed)
Copied from [place]. Topic: Quick Communication - Rx Refill/Question >> Jun 12, 2018  2:16 PM Bea Graff, NT wrote: Medication: ramipril (ALTACE) 10 MG capsule and triamcinolone lotion (KENALOG) 0.1 %  Has the patient contacted their pharmacy? Yes.   (Agent: If no, request that the patient contact the pharmacy for the refill.) (Agent: If yes, when and what did the pharmacy advise?)  Preferred Pharmacy (with phone number or street name): [facility]) - [place], [place] [phone] (Phone) [phone] (Fax)    Agent: Please be advised that RX refills may take up to 3 business days. We ask that you follow-up with your pharmacy.

## 2018-06-12 NOTE — Telephone Encounter (Signed)
Patient seen 2 days ago- notes to continue this 2 medications. Requested Prescriptions  Pending Prescriptions Disp Refills  . ramipril (ALTACE) 10 MG capsule 90 capsule 1    Sig: Take 1 capsule (10 mg total) by mouth daily.     Cardiovascular:  ACE Inhibitors Failed - 06/12/2018  2:19 PM      Failed - Cr in normal range and within 180 days    Creatinine, Ser  Date Value Ref Range Status  04/16/2018 1.26 (H) 0.40 - 1.20 mg/dL Final         Passed - K in normal range and within 180 days    Potassium  Date Value Ref Range Status  04/16/2018 4.0 3.5 - 5.1 mEq/L Final         Passed - Patient is not pregnant      Passed - Last BP in normal range    BP Readings from Last 1 Encounters:  06/10/18 132/72         Passed - Valid encounter within last 6 months    Recent Outpatient Visits          2 days ago Breast cancer screening   [place] [name], MD   10 months ago Essential hypertension   [facility] [place] [name], MD   1 year ago Breast cancer screening   [place] [name], MD   2 years ago Viral upper respiratory infection   [place], Angela Adam, MD   2 years ago Vitamin D deficiency   [facility] [place] [name], MD      Future Appointments            In 11 months O'Brien-Blaney, Bryson Corona, LPN [facility] [place], [place]   In 11 months [name], MD [place], [facility]         . triamcinolone lotion (KENALOG) 0.1 % 180 mL 1    Sig: APPLY TO AFFECTED AREA(S)  TWO TIMES DAILY     Dermatology:  Corticosteroids Passed - 06/12/2018  2:19 PM      Passed - Valid encounter within last 12 months    Recent Outpatient Visits          2 days ago Breast cancer screening   [place] [name], MD   10 months ago Essential hypertension   [facility] [place] [name],  MD   1 year ago Breast cancer screening   [place] [name], MD   2 years ago Viral upper respiratory infection   [place], Angela Adam, MD   2 years ago Vitamin D deficiency   [facility] [place] [name], MD      Future Appointments            In 11 months O'Brien-Blaney, Bryson Corona, LPN [place], [place]   In 11 months [name], Aris Everts, MD [facility], [facility]

## 2018-06-14 NOTE — Assessment & Plan Note (Signed)
Complicated by grief following the  death of mother.  Her symptoms improved after  Increasing her remeron to 45 mg daily .  No changes today.  

## 2018-06-14 NOTE — Assessment & Plan Note (Signed)
Bone Density scores reviewed from July 2019; she has osteopenia,  Moderate.  WILL repeat in 2 years and consider therapy then if there is a significant change. Continue calcium, vitamin d and weight bearing exercise on a regular basis.

## 2018-06-14 NOTE — Assessment & Plan Note (Signed)
5 YR FOLLOW UP COLONOSCOPY DUE IN 2021

## 2018-06-14 NOTE — Assessment & Plan Note (Addendum)
Recent fasting  glucose is elevated and  diagnostic of diabetes.  Based on her  A1c,   SHE DOES NOT need medications at this time.  She was encouraged to schedule an annual eye exam and to make  lifestyle changes including low glycemic index diet , weight loss (goal BMI < 30) , participate regularly in aerobic  Exercise, and to follow up in 6 months.  Lab Results  Component Value Date   HGBA1C 5.8 04/16/2018   Lab Results  Component Value Date   MICROALBUR <0.7 06/10/2018  .

## 2018-06-14 NOTE — Assessment & Plan Note (Signed)
Well controlled on current regimen. Renal function stable, no changes today.  Lab Results  Component Value Date   CREATININE 1.26 (H) 04/16/2018   Lab Results  Component Value Date   NA 135 04/16/2018   K 4.0 04/16/2018   CL 98 04/16/2018   CO2 27 04/16/2018

## 2018-06-24 ENCOUNTER — Telehealth: Payer: Self-pay | Admitting: Internal Medicine

## 2018-06-24 MED ORDER — OSELTAMIVIR PHOSPHATE 75 MG PO CAPS: 75.0000 mg | ORAL_CAPSULE | Freq: Every day | ORAL | 0 refills | Status: DC

## 2018-06-24 NOTE — Telephone Encounter (Signed)
Yes I have sent in  The tamiflu  she should  start tonigh  with just one tablet daily for  Prevention for 10 days,  And  Increase dose to  2  Times daily  if she develops  flu like symptoms

## 2018-06-24 NOTE — Telephone Encounter (Signed)
Called pt. She did not want to come in today. She has had to send 3 people home with the flu. Symptoms started for them with diarrhea and upset stomach. Confirmed with pt that she is not having any acute symptoms yet. She has felt a little nauseous today but feels fine besides that. She would like a rx for tamiflu as a preventative.

## 2018-06-24 NOTE — Telephone Encounter (Signed)
Copied from [place] [id number]. Topic: General - Other >> Jun 24, 2018  2:20 PM Kari [name] wrote: Reason for CRM: Pt is calling and she is feeling queasy on her stomach this just started.  Pt does not have diarrhea. Pt last seen dr [name] on 06-10-2018. Pt co workers  has been dx flu and she would like to have tamiflu send to her pharm for preventative. [facility] [place].

## 2018-06-24 NOTE — Telephone Encounter (Signed)
Spoke with pt and informed her that the Tamiflu has been sent in and that she should start taking it tonight once daily unless she starts to develop symptoms then she would need to increase dose to 2 tablets daily. Pt gave a verbal understanding.

## 2018-06-28 ENCOUNTER — Other Ambulatory Visit: Payer: Self-pay | Admitting: Internal Medicine

## 2018-12-11 ENCOUNTER — Telehealth: Payer: Self-pay | Admitting: Internal Medicine

## 2018-12-11 ENCOUNTER — Encounter: Payer: Self-pay | Admitting: *Deleted

## 2018-12-11 MED ORDER — COLCHICINE 0.6 MG PO TABS: 0.6000 mg | ORAL_TABLET | Freq: Two times a day (BID) | ORAL | 2 refills | Status: DC

## 2018-12-11 NOTE — Telephone Encounter (Signed)
Patient last OV 1/20 but PCP has not filled Colchicine since 08/2015 ok to fill?

## 2018-12-11 NOTE — Telephone Encounter (Signed)
Refilled ,  To use for gout flare .  Do not take for more than 10 days

## 2018-12-11 NOTE — Telephone Encounter (Signed)
Copied from [place]. Topic: Quick Communication - Rx Refill/Question >> Dec 11, 2018 11:59 AM Nils Flack wrote: Medication:  colchicine 0.6 MG tablet Has the patient contacted their pharmacy? No. (Agent: If no, request that the patient contact the pharmacy for the refill.) (Agent: If yes, when and what did the pharmacy advise?)  Preferred Pharm[facility] (with phone number [facility] street name[facility]  Expired rx   Agent: Please be advised that RX refills may take up to 3 business days. We ask that you follow-up with your pharmacy.

## 2018-12-11 NOTE — Telephone Encounter (Signed)
Sent directions my chart.

## 2018-12-11 NOTE — Telephone Encounter (Signed)
Line busy no voicemail cannot reach patient.

## 2018-12-16 ENCOUNTER — Other Ambulatory Visit: Payer: Self-pay

## 2018-12-16 ENCOUNTER — Encounter: Payer: Self-pay | Admitting: Internal Medicine

## 2018-12-16 ENCOUNTER — Ambulatory Visit (INDEPENDENT_AMBULATORY_CARE_PROVIDER_SITE_OTHER): Payer: PPO | Admitting: Internal Medicine

## 2018-12-16 DIAGNOSIS — E119 Type 2 diabetes mellitus without complications: Secondary | ICD-10-CM

## 2018-12-16 DIAGNOSIS — I10 Essential (primary) hypertension: Secondary | ICD-10-CM

## 2018-12-16 DIAGNOSIS — M109 Gout, unspecified: Secondary | ICD-10-CM

## 2018-12-16 DIAGNOSIS — Z1231 Encounter for screening mammogram for malignant neoplasm of breast: Secondary | ICD-10-CM

## 2018-12-16 DIAGNOSIS — R635 Abnormal weight gain: Secondary | ICD-10-CM

## 2018-12-16 DIAGNOSIS — Z6841 Body Mass Index (BMI) 40.0 and over, adult: Secondary | ICD-10-CM

## 2018-12-16 DIAGNOSIS — E785 Hyperlipidemia, unspecified: Secondary | ICD-10-CM

## 2018-12-16 DIAGNOSIS — Z1239 Encounter for other screening for malignant neoplasm of breast: Secondary | ICD-10-CM

## 2018-12-16 DIAGNOSIS — F325 Major depressive disorder, single episode, in full remission: Secondary | ICD-10-CM

## 2018-12-16 MED ORDER — LOSARTAN POTASSIUM 50 MG PO TABS: 50.0000 mg | ORAL_TABLET | Freq: Every day | ORAL | 3 refills | Status: DC

## 2018-12-16 NOTE — Progress Notes (Signed)
Virtual Visit via Doxyme  This visit type was conducted due to national recommendations for restrictions regarding the COVID-19 pandemic (e.g. social distancing).  This format is felt to be most appropriate for this patient at this time.  All issues noted in this document were discussed and addressed.  No physical exam was performed (except for noted visual exam findings with Video Visits).   I connected with@ on 12/16/18 at 10:00 AM EDT by a video enabled telemedicine application  and verified that I am speaking with the correct person using two identifiers. Location patient: home Location provider: work or home office Persons participating in the virtual visit: patient, provider  I discussed the limitations, risks, security and privacy concerns of performing an evaluation and management service by telephone and the availability of in person appointments. I also discussed with the  patient that there may be a patient responsible charge related to this service. The patient expressed understanding and agreed to proceed.   Reason for visit: follow up on Type 2 DM, hyperlipidemia, obesity and gouty arthritis   HPI:  73 yr old female with above PMH presents for 6 month follow up   The patient has no signs or symptoms of COVID 19 infection (fever, cough, sore throat  or shortness of breath beyond what is typical for patient).  Patient denies contact with other persons with the above mentioned symptoms or with anyone confirmed to have COVID 19   T2DM: Patient does not check blood sugars more than once a month,  Last one was 135 a month ago in a fasting state.  Dos not recall any above 200 or less than 80.  No complaints today.  Does not use hypoglycemics,   Not exercising on a regular basis or trying to lose weight, but weight is reportedly stable.   Patient voices awareness  of the foods he/she needs to avoid,  And follows a low GI diet about 80% of the time.  Has not had an annual diabetic eye  exam.  Denies numbness and tingling in lower extremities.  Denies hypoglycemic symptoms.   Gouty arthritis:  Has 1-2 attacks per year.  Uses colchicine prn but rx has expired.  Used expired meds last week for flare,  No improvement left foot.   History of migraines:  None recently, stats that former PCP prescribed ramipril for management.  Discussed change to ARB .    ROS: See pertinent positives and negatives per HPI.  Past Medical History:  Diagnosis Date  . Allergy    to cat, dogs, pollen, dust, cigarette smoke  . Arthritis    feet  . Asthma   . Depression   . GERD (gastroesophageal reflux disease)   . Hx of adenomatous colonic polyps 05/30/2010  . Hyperlipidemia   . Hypertension   . Menopause   . Migraines   . Miscarriage    X 1    Past Surgical History:  Procedure Laterality Date  . CESAREAN SECTION     X 2  . DENTAL SURGERY  09/2016  . TUBAL LIGATION  1980    Family History  Problem Relation Age of Onset  . Myasthenia gravis Mother   . Coronary artery disease Father   . Valvular heart disease Father   . Heart attack Father 28  . Cancer Sister 55       colon  . Colon cancer Sister 69  . Heart attack Paternal Grandfather        23's    SOCIAL  HX:  reports that she quit smoking about 47 years ago. She has never used smokeless tobacco. She reports current alcohol use. She reports that she does not use drugs.   Current Outpatient Medications:  .  allopurinol (ZYLOPRIM) 300 MG tablet, Take 1 tablet (300 mg total) by mouth daily., Disp: 90 tablet, Rfl: 1 .  aspirin 325 MG tablet, Take 325 mg by mouth daily.  , Disp: , Rfl:  .  atorvastatin (LIPITOR) 10 MG tablet, Take 1 tablet (10 mg total) by mouth daily., Disp: 90 tablet, Rfl: 1 .  cetirizine (ZYRTEC) 10 MG tablet, Take 10 mg by mouth daily.  , Disp: , Rfl:  .  colchicine 0.6 MG tablet, Take 1 tablet (0.6 mg total) by mouth 2 (two) times daily. As needed for gout flare., Disp: 60 tablet, Rfl: 2 .  diflorasone  (PSORCON) 0.05 % cream, Apply 1 application topically 2 (two) times daily., Disp: 30 g, Rfl: 5 .  esomeprazole (NEXIUM) 20 MG capsule, Take 20 mg by mouth daily at 12 noon., Disp: , Rfl:  .  hydrochlorothiazide (HYDRODIURIL) 25 MG tablet, Take 1 tablet (25 mg total) by mouth daily., Disp: 90 tablet, Rfl: 1 .  mirtazapine (REMERON) 45 MG tablet, TAKE 1 TABLET BY MOUTH AT  BEDTIME, Disp: 90 tablet, Rfl: 1 .  montelukast (SINGULAIR) 10 MG tablet, Take 1 tablet (10 mg total) by mouth at bedtime., Disp: 90 tablet, Rfl: 1 .  OVER THE COUNTER MEDICATION, Nasocort nasal spray, Disp: , Rfl:  .  Probiotic Product (PROBIOTIC PO), Take by mouth., Disp: , Rfl:  .  triamcinolone lotion (KENALOG) 0.1 %, APPLY TO AFFECTED AREA(S)  TWO TIMES DAILY, Disp: 180 mL, Rfl: 1 .  losartan (COZAAR) 50 MG tablet, Take 1 tablet (50 mg total) by mouth at bedtime., Disp: 90 tablet, Rfl: 3  EXAM:  VITALS per patient if applicable:  GENERAL: alert, oriented, appears well and in no acute distress  HEENT: atraumatic, conjunttiva clear, no obvious abnormalities on inspection of external nose and ears  NECK: normal movements of the head and neck  LUNGS: on inspection no signs of respiratory distress, breathing rate appears normal, no obvious gross SOB, gasping or wheezing  CV: no obvious cyanosis  MS: moves all visible extremities without noticeable abnormality  PSYCH/NEURO: pleasant and cooperative, no obvious depression or anxiety, speech and thought processing grossly intact  ASSESSMENT AND PLAN:   Hyperlipidemia LDL goal <130 LDL is at goal with generic Lipitor.  She still has elevated triglycerides, but they are improving with weight loss.  Continue low glycemic index diet. Will repeat now  Lab Results  Component Value Date   CHOL 154 04/16/2018   HDL 37.80 (L) 04/16/2018   LDLCALC 70 08/11/2013   LDLDIRECT 84.0 04/16/2018   TRIG 246.0 (H) 04/16/2018   CHOLHDL 4 04/16/2018   Lab Results  Component Value  Date   ALT 24 04/16/2018   AST 27 04/16/2018   ALKPHOS 90 04/16/2018   BILITOT 0.7 04/16/2018         Essential hypertension Please tell patient that  I am making a decision to change patient's ACE Inhibitor to an ARB   based on increased reports of  angioedema .  I also advised patient to  take it at night instead of morning,  as recent studies have shown a reduction in incidence of heart attacks and strokes. If her headaches recur without lisinopril will consider trial of beta blocker  Major depressive disorder, single episode,  in remission ([place]) Complicated by grief following the  death of mother.  Her symptoms improved after  Increasing her remeron to 45 mg daily .  No changes today.   Screening for malignant neoplasm of breast Last mammogram 2019 ,  Overdue for  Annual due to [place] pandemic causing delay  Gouty arthritis of toe of right foot Refilling colchicine for prn use. Uric acid level pending   Diabetes mellitus without complication (HCC)  Recent fasting  glucose was elevated and  diagnostic of diabetes. She has lowered her a1c and did not  need medications at this time.  She was encouraged to schedule an annual eye exam and to continue to adhere to low glycemic index diet , weight loss (goal BMI < 30) , participate regularly in aerobic  Exercise, and to return for repeat labs   Lab Results  Component Value Date   HGBA1C 5.8 04/16/2018   Lab Results  Component Value Date   MICROALBUR <0.7 06/10/2018  .  Class 3 severe obesity without serious comorbidity with body mass index (BMI) of 40.0 to 44.9 in adult [facility]) I have addressed  BMI and recommended wt loss of 10% of body weigh over the next 6 months using a low glycemic index diet and regular exercise a minimum of 5 days per week.      I discussed the assessment and treatment plan with the patient. The patient was provided an opportunity to ask questions and all were answered. The patient agreed with the plan and  demonstrated an understanding of the instructions.   The patient was advised to call back or seek an in-person evaluation if the symptoms worsen or if the condition fails to improve as anticipated.  I provided 25 minutes of non-face-to-face time during this encounter.   [name], MD

## 2018-12-17 ENCOUNTER — Encounter: Payer: Self-pay | Admitting: Internal Medicine

## 2018-12-19 NOTE — Assessment & Plan Note (Addendum)
Please tell patient that  I am making a decision to change patient's ACE Inhibitor to an ARB   based on increased reports of  angioedema .  I also advised patient to  take it at night instead of morning,  as recent studies have shown a reduction in incidence of heart attacks and strokes. If her headaches recur without lisinopril will consider trial of beta blocker

## 2018-12-19 NOTE — Assessment & Plan Note (Signed)
Complicated by grief following the  death of mother.  Her symptoms improved after  Increasing her remeron to 45 mg daily .  No changes today.

## 2018-12-19 NOTE — Assessment & Plan Note (Signed)
Recent fasting  glucose was elevated and  diagnostic of diabetes. She has lowered her a1c and did not  need medications at this time.  She was encouraged to schedule an annual eye exam and to continue to adhere to low glycemic index diet , weight loss (goal BMI < 30) , participate regularly in aerobic  Exercise, and to return for repeat labs   Lab Results  Component Value Date   HGBA1C 5.8 04/16/2018   Lab Results  Component Value Date   MICROALBUR <0.7 06/10/2018  .

## 2018-12-19 NOTE — Assessment & Plan Note (Addendum)
Refilling colchicine for prn use. Uric acid level pending

## 2018-12-19 NOTE — Assessment & Plan Note (Signed)
I have addressed  BMI and recommended wt loss of 10% of body weigh over the next 6 months using a low glycemic index diet and regular exercise a minimum of 5 days per week.   

## 2018-12-19 NOTE — Assessment & Plan Note (Addendum)
Last mammogram 2019 ,  Overdue for  Annual due to [place] pandemic causing delay

## 2018-12-19 NOTE — Assessment & Plan Note (Signed)
LDL is at goal with generic Lipitor.  She still has elevated triglycerides, but they are improving with weight loss.  Continue low glycemic index diet. Will repeat now  Lab Results  Component Value Date   CHOL 154 04/16/2018   HDL 37.80 (L) 04/16/2018   LDLCALC 70 08/11/2013   LDLDIRECT 84.0 04/16/2018   TRIG 246.0 (H) 04/16/2018   CHOLHDL 4 04/16/2018   Lab Results  Component Value Date   ALT 24 04/16/2018   AST 27 04/16/2018   ALKPHOS 90 04/16/2018   BILITOT 0.7 04/16/2018

## 2018-12-23 ENCOUNTER — Telehealth: Payer: Self-pay

## 2018-12-23 NOTE — Telephone Encounter (Signed)
LMTCB. Need to schedule pt for a nonfasting lab appt.  

## 2019-01-05 ENCOUNTER — Other Ambulatory Visit: Payer: Self-pay

## 2019-01-05 DIAGNOSIS — M109 Gout, unspecified: Secondary | ICD-10-CM

## 2019-01-05 MED ORDER — ALLOPURINOL 300 MG PO TABS: 300.0000 mg | ORAL_TABLET | Freq: Every day | ORAL | 1 refills | Status: DC

## 2019-01-05 MED ORDER — TRIAMCINOLONE ACETONIDE 0.1 % EX LOTN: TOPICAL_LOTION | CUTANEOUS | 1 refills | Status: DC

## 2019-01-05 NOTE — Telephone Encounter (Signed)
Refilled: 06/12/2018. Last OV: 12/16/2018 Next OV: 06/24/2019

## 2019-01-14 ENCOUNTER — Other Ambulatory Visit: Payer: Self-pay

## 2019-01-14 MED ORDER — HYDROCHLOROTHIAZIDE 25 MG PO TABS: 25.0000 mg | ORAL_TABLET | Freq: Every day | ORAL | 1 refills | Status: DC

## 2019-01-14 MED ORDER — MONTELUKAST SODIUM 10 MG PO TABS: 10.0000 mg | ORAL_TABLET | Freq: Every day | ORAL | 1 refills | Status: DC

## 2019-01-14 MED ORDER — ATORVASTATIN CALCIUM 10 MG PO TABS: 10.0000 mg | ORAL_TABLET | Freq: Every day | ORAL | 1 refills | Status: DC

## 2019-01-26 ENCOUNTER — Telehealth: Payer: Self-pay | Admitting: Internal Medicine

## 2019-01-26 MED ORDER — PREDNISONE 10 MG PO TABS: ORAL_TABLET | ORAL | 0 refills | Status: DC

## 2019-01-26 MED ORDER — HYDROCODONE-ACETAMINOPHEN 10-325 MG PO TABS: 1.0000 | ORAL_TABLET | Freq: Four times a day (QID) | ORAL | 0 refills | Status: DC | PRN

## 2019-01-26 NOTE — Telephone Encounter (Signed)
Patient notified and scheduled 

## 2019-01-26 NOTE — Telephone Encounter (Signed)
Left foot in the arch of her foot has swelling, red and hot to touch. Patient say she it is sure it is her gout flaring this is the same area at arch near ankle that she always has her flares. Patient said it started on Sunday, started the colchicine on Monday but has had no relief, cannot bare weight on foot.  Patient ask if there is something else she can take for the pain. No appointments available today.

## 2019-01-26 NOTE — Telephone Encounter (Signed)
6 day Prednisone taper and vicodin sent to local pharmacy.  Schedule in next available FTF or virtual slot.

## 2019-01-26 NOTE — Telephone Encounter (Signed)
Pt called and stated that she is having a very bad gout flare up and it is very painful. Pt states that medication colchicine 0.6 MG tablet [id number] is not helping. Pt states that is is going up her left leg and she can not walk because of the gout. Pt would like a call back from the nurse to see if anything else can be done.

## 2019-01-29 ENCOUNTER — Encounter: Payer: Self-pay | Admitting: Internal Medicine

## 2019-01-29 ENCOUNTER — Ambulatory Visit (INDEPENDENT_AMBULATORY_CARE_PROVIDER_SITE_OTHER): Payer: PPO | Admitting: Internal Medicine

## 2019-01-29 VITALS — Ht 62.0 in | Wt 232.0 lb

## 2019-01-29 DIAGNOSIS — E781 Pure hyperglyceridemia: Secondary | ICD-10-CM | POA: Diagnosis not present

## 2019-01-29 DIAGNOSIS — M109 Gout, unspecified: Secondary | ICD-10-CM | POA: Diagnosis not present

## 2019-01-29 NOTE — Progress Notes (Signed)
Virtual Visit via [email or web address]   This visit type was conducted due to national recommendations for restrictions regarding the COVID-19 pandemic (e.g. social distancing).  This format is felt to be most appropriate for this patient at this time.  All issues noted in this document were discussed and addressed.  No physical exam was performed (except for noted visual exam findings with Video Visits).   I connected with@ on 01/29/19 at  9:00 AM EDT by a video enabled telemedicine application and verified that I am speaking with the correct person using two identifiers. Location patient: home Location provider: work or home office Persons participating in the virtual visit: patient, provider  I discussed the limitations, risks, security and privacy concerns of performing an evaluation and management service by telephone and the availability of in person appointments. I also discussed with the patient that there may be a patient responsible charge related to this service. The patient expressed understanding and agreed to proceed.  Reason for visit: follow up on gout flare   HPI:  73 yr old with history of gout,  On prophylactic allopurinol,  Started taking colchicine  On Monday for sudden onset of midfoot pain accompanied by redness and swelling that started on Sunday  Pain has progressed and impairing her ability to ambulate.  Stayed home from work on Tuesday due to pain.  She started taking  Prednisone on  Tuesday afternoon and prn  vicodin , and had almost immediate improvement in her pain .  She returned to work half day on Wednesday and a full day on a Thursday.  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   Last flare was 2 months ago,  Prior to that she has not had a gout flare in  over a year. . She has not started any  new meds no dietary changes , recalls that she began  taking Neuriva  One month ago for brain performance.   .  ROS: See pertinent positives and negatives per HPI.  Past  Medical History:  Diagnosis Date  . Allergy    to cat, dogs, pollen, dust, cigarette smoke  . Arthritis    feet  . Asthma   . Depression   . GERD (gastroesophageal reflux disease)   . Hx of adenomatous colonic polyps 05/30/2010  . Hyperlipidemia   . Hypertension   . Menopause   . Migraines   . Miscarriage    X 1    Past Surgical History:  Procedure Laterality Date  . CESAREAN SECTION     X 2  . DENTAL SURGERY  09/2016  . TUBAL LIGATION  1980    Family History  Problem Relation Age of Onset  . Myasthenia gravis Mother   . Coronary artery disease Father   . Valvular heart disease Father   . Heart attack Father 63  . Cancer Sister 45       colon  . Colon cancer Sister 45  . Heart attack Paternal Grandfather        40 's    SOCIAL HX:  reports that she quit smoking about 48 years ago. She has never used smokeless tobacco. She reports current alcohol use. She reports that she does not use drugs.   Current Outpatient Medications:  .  allopurinol (ZYLOPRIM) 300 MG tablet, Take 1 tablet (300 mg total) by mouth daily., Disp: 90 tablet, Rfl: 1 .  aspirin 325 MG tablet, Take 325 mg by mouth daily.  , Disp: , Rfl:  .  atorvastatin (LIPITOR) 10 MG tablet, Take 1 tablet (10 mg total) by mouth daily., Disp: 90 tablet, Rfl: 1 .  cetirizine (ZYRTEC) 10 MG tablet, Take 10 mg by mouth daily.  , Disp: , Rfl:  .  colchicine 0.6 MG tablet, Take 1 tablet (0.6 mg total) by mouth 2 (two) times daily. As needed for gout flare., Disp: 60 tablet, Rfl: 2 .  diflorasone (PSORCON) 0.05 % cream, Apply 1 application topically 2 (two) times daily., Disp: 30 g, Rfl: 5 .  esomeprazole (NEXIUM) 20 MG capsule, Take 20 mg by mouth daily at 12 noon., Disp: , Rfl:  .  hydrochlorothiazide (HYDRODIURIL) 25 MG tablet, Take 1 tablet (25 mg total) by mouth daily., Disp: 90 tablet, Rfl: 1 .  HYDROcodone-acetaminophen (NORCO) 10-325 MG tablet, Take 1 tablet by mouth every 6 (six) hours as needed., Disp: 30 tablet,  Rfl: 0 .  losartan (COZAAR) 50 MG tablet, Take 1 tablet (50 mg total) by mouth at bedtime., Disp: 90 tablet, Rfl: 3 .  mirtazapine (REMERON) 45 MG tablet, TAKE 1 TABLET BY MOUTH AT  BEDTIME, Disp: 90 tablet, Rfl: 1 .  montelukast (SINGULAIR) 10 MG tablet, Take 1 tablet (10 mg total) by mouth at bedtime., Disp: 90 tablet, Rfl: 1 .  OVER THE COUNTER MEDICATION, Nasocort nasal spray, Disp: , Rfl:  .  predniSONE (DELTASONE) 10 MG tablet, 6 tablets on Day 1 , then reduce by 1 tablet daily until gone, Disp: 21 tablet, Rfl: 0 .  Probiotic Product (PROBIOTIC PO), Take by mouth., Disp: , Rfl:  .  triamcinolone lotion (KENALOG) 0.1 %, APPLY TO AFFECTED AREA(S)  TWO TIMES DAILY, Disp: 180 mL, Rfl: 1  EXAM:  VITALS per patient if applicable:  GENERAL: alert, oriented, appears well and in no acute distress  HEENT: atraumatic, conjunttiva clear, no obvious abnormalities on inspection of external nose and ears  NECK: normal movements of the head and neck  LUNGS: on inspection no signs of respiratory distress, breathing rate appears normal, no obvious gross SOB, gasping or wheezing  CV: no obvious cyanosis  MS: moves all visible extremities without noticeable abnormality  MSK: right foot without erythema or swelling   PSYCH/NEURO: pleasant and cooperative, no obvious depression or anxiety, speech and thought processing grossly intact  ASSESSMENT AND PLAN:  Discussed the following assessment and plan:  Hypertriglyceridemia - Plan: Lipid panel  Gouty arthritis of toe of right foot  Gouty arthritis of toe of right foot Continue prednisone taper and resume colchicine on day 6.  Once daily for several days.  Return in 2 weeks for uric acid level. advised to stop Neuriva     I discussed the assessment and treatment plan with the patient. The patient was provided an opportunity to ask questions and all were answered. The patient agreed with the plan and demonstrated an understanding of the  instructions.   The patient was advised to call back or seek an in-person evaluation if the symptoms worsen or if the condition fails to improve as anticipated.   I provided  15 minutes of non-face-to-face time during this encounter reviewing patient's history of gout flares,  providing counseling on the above mentioned problem , and coordination  of care .   [name], MD

## 2019-01-31 NOTE — Assessment & Plan Note (Signed)
Continue prednisone taper and resume colchicine on day 6.  Once daily for several days.  Return in 2 weeks for uric acid level. advised to stop Neuriva

## 2019-02-10 DIAGNOSIS — H43813 Vitreous degeneration, bilateral: Secondary | ICD-10-CM | POA: Diagnosis not present

## 2019-02-10 DIAGNOSIS — H35342 Macular cyst, hole, or pseudohole, left eye: Secondary | ICD-10-CM | POA: Diagnosis not present

## 2019-02-10 DIAGNOSIS — H43391 Other vitreous opacities, right eye: Secondary | ICD-10-CM | POA: Diagnosis not present

## 2019-02-10 DIAGNOSIS — H35371 Puckering of macula, right eye: Secondary | ICD-10-CM | POA: Diagnosis not present

## 2019-02-16 ENCOUNTER — Other Ambulatory Visit: Payer: Self-pay

## 2019-02-16 MED ORDER — MIRTAZAPINE 45 MG PO TABS: 45.0000 mg | ORAL_TABLET | Freq: Every day | ORAL | 1 refills | Status: DC

## 2019-02-17 ENCOUNTER — Ambulatory Visit: Payer: PPO | Admitting: Internal Medicine

## 2019-02-18 ENCOUNTER — Other Ambulatory Visit: Payer: Self-pay

## 2019-02-18 ENCOUNTER — Other Ambulatory Visit (INDEPENDENT_AMBULATORY_CARE_PROVIDER_SITE_OTHER): Payer: PPO

## 2019-02-18 DIAGNOSIS — I10 Essential (primary) hypertension: Secondary | ICD-10-CM

## 2019-02-18 DIAGNOSIS — E119 Type 2 diabetes mellitus without complications: Secondary | ICD-10-CM | POA: Diagnosis not present

## 2019-02-18 DIAGNOSIS — E781 Pure hyperglyceridemia: Secondary | ICD-10-CM

## 2019-02-18 DIAGNOSIS — M109 Gout, unspecified: Secondary | ICD-10-CM

## 2019-02-18 DIAGNOSIS — R635 Abnormal weight gain: Secondary | ICD-10-CM

## 2019-02-18 LAB — COMPREHENSIVE METABOLIC PANEL
ALT: 23 U/L (ref 0–35)
AST: 24 U/L (ref 0–37)
Albumin: 4 g/dL (ref 3.5–5.2)
Alkaline Phosphatase: 88 U/L (ref 39–117)
BUN: 17 mg/dL (ref 6–23)
CO2: 25 mEq/L (ref 19–32)
Calcium: 9.4 mg/dL (ref 8.4–10.5)
Chloride: 99 mEq/L (ref 96–112)
Creatinine, Ser: 1.02 mg/dL (ref 0.40–1.20)
GFR: 53.35 mL/min — ABNORMAL LOW (ref >60.00)
Glucose, Bld: 130 mg/dL — ABNORMAL HIGH (ref 70–99)
Potassium: 3.8 mEq/L (ref 3.5–5.1)
Sodium: 135 mEq/L (ref 135–145)
Total Bilirubin: 0.6 mg/dL (ref 0.2–1.2)
Total Protein: 6.2 g/dL (ref 6.0–8.3)

## 2019-02-18 LAB — LIPID PANEL
Cholesterol: 164 mg/dL (ref 0–200)
HDL: 40.1 mg/dL (ref >39.00)
NonHDL: 123.84
Total CHOL/HDL Ratio: 4
Triglycerides: 271 mg/dL — ABNORMAL HIGH (ref 0.0–149.0)
VLDL: 54.2 mg/dL — ABNORMAL HIGH (ref 0.0–40.0)

## 2019-02-18 LAB — TSH: TSH: 2.05 u[IU]/mL (ref 0.35–4.50)

## 2019-02-18 LAB — HEMOGLOBIN A1C: Hgb A1c MFr Bld: 6.1 % (ref 4.6–6.5)

## 2019-02-18 LAB — URIC ACID: Uric Acid, Serum: 5.9 mg/dL (ref 2.4–7.0)

## 2019-02-18 LAB — LDL CHOLESTEROL, DIRECT: Direct LDL: 92 mg/dL

## 2019-02-19 DIAGNOSIS — H2513 Age-related nuclear cataract, bilateral: Secondary | ICD-10-CM | POA: Diagnosis not present

## 2019-02-19 DIAGNOSIS — H2512 Age-related nuclear cataract, left eye: Secondary | ICD-10-CM | POA: Diagnosis not present

## 2019-02-19 DIAGNOSIS — H35373 Puckering of macula, bilateral: Secondary | ICD-10-CM | POA: Diagnosis not present

## 2019-02-24 MED ORDER — MIRTAZAPINE 45 MG PO TABS: 45.0000 mg | ORAL_TABLET | Freq: Every day | ORAL | 1 refills | Status: DC

## 2019-02-24 NOTE — Telephone Encounter (Signed)
mirtazapine (REMERON) 45 MG tablet   Pt stated this medication is suppose to be filled at [facility]

## 2019-02-24 NOTE — Addendum Note (Signed)
Addended by: Jefferson Fuel on: 02/24/2019 02:52 PM   Modules accepted: Orders

## 2019-03-25 DIAGNOSIS — H2512 Age-related nuclear cataract, left eye: Secondary | ICD-10-CM | POA: Diagnosis not present

## 2019-03-25 DIAGNOSIS — H2511 Age-related nuclear cataract, right eye: Secondary | ICD-10-CM | POA: Diagnosis not present

## 2019-03-25 DIAGNOSIS — H25012 Cortical age-related cataract, left eye: Secondary | ICD-10-CM | POA: Diagnosis not present

## 2019-04-15 DIAGNOSIS — H25011 Cortical age-related cataract, right eye: Secondary | ICD-10-CM | POA: Diagnosis not present

## 2019-04-15 DIAGNOSIS — H2511 Age-related nuclear cataract, right eye: Secondary | ICD-10-CM | POA: Diagnosis not present

## 2019-04-20 DIAGNOSIS — I25118 Atherosclerotic heart disease of native coronary artery with other forms of angina pectoris: Secondary | ICD-10-CM

## 2019-04-20 HISTORY — DX: Atherosclerotic heart disease of native coronary artery with other forms of angina pectoris: I25.118

## 2019-06-04 ENCOUNTER — Ambulatory Visit: Payer: PPO | Admitting: Internal Medicine

## 2019-06-04 ENCOUNTER — Ambulatory Visit: Payer: PPO

## 2019-06-24 ENCOUNTER — Ambulatory Visit: Payer: PPO | Admitting: Internal Medicine

## 2019-06-24 ENCOUNTER — Ambulatory Visit: Payer: PPO

## 2019-06-28 ENCOUNTER — Other Ambulatory Visit: Payer: Self-pay

## 2019-06-28 DIAGNOSIS — M109 Gout, unspecified: Secondary | ICD-10-CM

## 2019-06-28 MED ORDER — ALLOPURINOL 300 MG PO TABS: 300.0000 mg | ORAL_TABLET | Freq: Every day | ORAL | 1 refills | Status: DC

## 2019-07-05 ENCOUNTER — Other Ambulatory Visit: Payer: Self-pay

## 2019-07-05 MED ORDER — MONTELUKAST SODIUM 10 MG PO TABS: 10.0000 mg | ORAL_TABLET | Freq: Every day | ORAL | 1 refills | Status: DC

## 2019-07-05 MED ORDER — HYDROCHLOROTHIAZIDE 25 MG PO TABS: 25.0000 mg | ORAL_TABLET | Freq: Every day | ORAL | 1 refills | Status: DC

## 2019-07-16 ENCOUNTER — Other Ambulatory Visit: Payer: Self-pay

## 2019-07-16 MED ORDER — ATORVASTATIN CALCIUM 10 MG PO TABS: 10.0000 mg | ORAL_TABLET | Freq: Every day | ORAL | 1 refills | Status: DC

## 2019-07-20 ENCOUNTER — Other Ambulatory Visit: Payer: Self-pay | Admitting: Internal Medicine

## 2019-07-20 NOTE — Telephone Encounter (Signed)
Refilled: 01/05/2019 Last OV: 01/29/2019 Next OV: 08/18/2019

## 2019-08-04 ENCOUNTER — Other Ambulatory Visit: Payer: Self-pay

## 2019-08-04 MED ORDER — MIRTAZAPINE 45 MG PO TABS: 45.0000 mg | ORAL_TABLET | Freq: Every day | ORAL | 1 refills | Status: DC

## 2019-08-18 ENCOUNTER — Encounter: Payer: Self-pay | Admitting: Internal Medicine

## 2019-08-18 ENCOUNTER — Telehealth: Payer: Self-pay | Admitting: Internal Medicine

## 2019-08-18 ENCOUNTER — Ambulatory Visit: Payer: PPO

## 2019-08-18 ENCOUNTER — Other Ambulatory Visit: Payer: Self-pay

## 2019-08-18 ENCOUNTER — Ambulatory Visit (INDEPENDENT_AMBULATORY_CARE_PROVIDER_SITE_OTHER): Payer: PPO | Admitting: Internal Medicine

## 2019-08-18 VITALS — Ht 62.0 in | Wt 232.0 lb

## 2019-08-18 DIAGNOSIS — M109 Gout, unspecified: Secondary | ICD-10-CM

## 2019-08-18 DIAGNOSIS — E785 Hyperlipidemia, unspecified: Secondary | ICD-10-CM | POA: Diagnosis not present

## 2019-08-18 DIAGNOSIS — Z6841 Body Mass Index (BMI) 40.0 and over, adult: Secondary | ICD-10-CM

## 2019-08-18 DIAGNOSIS — J45909 Unspecified asthma, uncomplicated: Secondary | ICD-10-CM

## 2019-08-18 DIAGNOSIS — F325 Major depressive disorder, single episode, in full remission: Secondary | ICD-10-CM | POA: Diagnosis not present

## 2019-08-18 DIAGNOSIS — E559 Vitamin D deficiency, unspecified: Secondary | ICD-10-CM

## 2019-08-18 DIAGNOSIS — I10 Essential (primary) hypertension: Secondary | ICD-10-CM | POA: Diagnosis not present

## 2019-08-18 DIAGNOSIS — J209 Acute bronchitis, unspecified: Secondary | ICD-10-CM | POA: Insufficient documentation

## 2019-08-18 DIAGNOSIS — E1140 Type 2 diabetes mellitus with diabetic neuropathy, unspecified: Secondary | ICD-10-CM | POA: Diagnosis not present

## 2019-08-18 MED ORDER — ALBUTEROL SULFATE HFA 108 (90 BASE) MCG/ACT IN AERS: 2.0000 | INHALATION_SPRAY | Freq: Four times a day (QID) | RESPIRATORY_TRACT | 1 refills | Status: DC | PRN

## 2019-08-18 MED ORDER — BENZONATATE 200 MG PO CAPS: 200.0000 mg | ORAL_CAPSULE | Freq: Three times a day (TID) | ORAL | 1 refills | Status: DC | PRN

## 2019-08-18 MED ORDER — PREDNISONE 10 MG PO TABS: ORAL_TABLET | ORAL | 0 refills | Status: DC

## 2019-08-18 NOTE — Assessment & Plan Note (Signed)
Her symptoms are managed with remeron to 45 mg daily .  No changes today.

## 2019-08-18 NOTE — Progress Notes (Signed)
Telephone Note  This visit type was conducted due to national recommendations for restrictions regarding the COVID-19 pandemic (e.g. social distancing).  This format is felt to be most appropriate for this patient at this time.  All issues noted in this document were discussed and addressed.  No physical exam was performed (except for noted visual exam findings with Video Visits).   I connected with@ on 08/18/19 at  9:30 AM EDT by  telephone and verified that I am speaking with the correct person using two identifiers. Location patient: home Location provider: work or home office Persons participating in the virtual visit: patient, provider  I discussed the limitations, risks, security and privacy concerns of performing an evaluation and management service by telephone and the availability of in person appointments. I also discussed with the patient that there may be a patient responsible charge related to this service. The patient expressed understanding and agreed to proceed.   Reason for visit: follow up  HPI:  Patient presented for in office visit but screened positive for COVID 19 SYMPTOMS  And was directed to stay in her car.  Symptoms  started 1.5 weeks ago with productive cough, dyspnea, wheezing,  Loss of  Smell and taste ,  Diarrhea ,  missed a few days of work.  Oxygen level  dropped to 94 to 95%  .  No pleurisy  .  "feels like not enough air coming in.  All symptoms improving except the cough and l wheezing .  No recent travel or known contact    Patient has received both doses of the available COVID 19 vaccine without complications.  Patient continues to mask when outside of the home except when walking in yard or at safe distances from others .  Patient denies any change in mood or development of unhealthy behaviors resuting from the pandemic's restriction of activities and socialization.   finished vaccine series on  March 12.   .ttcovid  Dm: not checking sugars.  Weight stable.  Follows low GI diet 50%  Of the tie,  lots of fruits,  No pastries or ice cream.  Has developed some mild numbness of both forefeet ,  checks feet daily  HTN:  Not checking BP.  Taking meds  Depression:  Symptoms controlled with mirtazipine.  Has not had depressed mood despite  decreased social contact imposed by the local government  in response to the COVID 19 pandemic       ROS: See pertinent positives and negatives per HPI.  Past Medical History:  Diagnosis Date  . Allergy    to cat, dogs, pollen, dust, cigarette smoke  . Arthritis    feet  . Asthma   . Depression   . GERD (gastroesophageal reflux disease)   . Hx of adenomatous colonic polyps 05/30/2010  . Hyperlipidemia   . Hypertension   . Menopause   . Migraines   . Miscarriage    X 1    Past Surgical History:  Procedure Laterality Date  . CESAREAN SECTION     X 2  . DENTAL SURGERY  09/2016  . TUBAL LIGATION  1980    Family History  Problem Relation Age of Onset  . Myasthenia gravis Mother   . Coronary artery disease Father   . Valvular heart disease Father   . Heart attack Father 85  . Cancer Sister 46       colon  . Colon cancer Sister 42  . Heart attack Paternal Grandfather  82's    SOCIAL HX:  reports that she quit smoking about 48 years ago. She has never used smokeless tobacco. She reports current alcohol use. She reports that she does not use drugs.   Current Outpatient Medications:  .  allopurinol (ZYLOPRIM) 300 MG tablet, Take 1 tablet (300 mg total) by mouth daily., Disp: 90 tablet, Rfl: 1 .  aspirin 325 MG tablet, Take 325 mg by mouth daily.  , Disp: , Rfl:  .  atorvastatin (LIPITOR) 10 MG tablet, Take 1 tablet (10 mg total) by mouth daily., Disp: 90 tablet, Rfl: 1 .  cetirizine (ZYRTEC) 10 MG tablet, Take 10 mg by mouth daily.  , Disp: , Rfl:  .  colchicine 0.6 MG tablet, Take 1 tablet (0.6 mg total) by mouth 2 (two) times daily. As needed for gout flare., Disp: 60 tablet, Rfl: 2 .   diflorasone (PSORCON) 0.05 % cream, Apply 1 application topically 2 (two) times daily., Disp: 30 g, Rfl: 5 .  esomeprazole (NEXIUM) 20 MG capsule, Take 20 mg by mouth daily at 12 noon., Disp: , Rfl:  .  hydrochlorothiazide (HYDRODIURIL) 25 MG tablet, Take 1 tablet (25 mg total) by mouth daily., Disp: 90 tablet, Rfl: 1 .  HYDROcodone-acetaminophen (NORCO) 10-325 MG tablet, Take 1 tablet by mouth every 6 (six) hours as needed., Disp: 30 tablet, Rfl: 0 .  losartan (COZAAR) 50 MG tablet, Take 1 tablet (50 mg total) by mouth at bedtime., Disp: 90 tablet, Rfl: 3 .  mirtazapine (REMERON) 45 MG tablet, Take 1 tablet (45 mg total) by mouth at bedtime., Disp: 90 tablet, Rfl: 1 .  montelukast (SINGULAIR) 10 MG tablet, Take 1 tablet (10 mg total) by mouth at bedtime., Disp: 90 tablet, Rfl: 1 .  OVER THE COUNTER MEDICATION, Nasocort nasal spray, Disp: , Rfl:  .  Probiotic Product (PROBIOTIC PO), Take by mouth., Disp: , Rfl:  .  triamcinolone lotion (KENALOG) 0.1 %, Apply to affected area(s) twice a day, Disp: 180 mL, Rfl: 0 .  albuterol (VENTOLIN HFA) 108 (90 Base) MCG/ACT inhaler, Inhale 2 puffs into the lungs every 6 (six) hours as needed for wheezing or shortness of breath., Disp: 8 g, Rfl: 1 .  benzonatate (TESSALON) 200 MG capsule, Take 1 capsule (200 mg total) by mouth 3 (three) times daily as needed for cough., Disp: 60 capsule, Rfl: 1 .  predniSONE (DELTASONE) 10 MG tablet, 6 tablets daily for 3 days, then reduce by 1 tablet daily until gone, Disp: 33 tablet, Rfl: 0  EXAM:  VITALS per patient if applicable:   General impression: alert, cooperative and articulate.  No signs of being in distress  Lungs: speech is fluent sentence length suggests that patient is not short of breath but is  punctuated by cough   Psych: affect normal.  speech is articulate and non pressured .  Denies suicidal thoughts   ASSESSMENT AND PLAN:  Discussed the following assessment and plan:  Type 2 diabetes mellitus with  diabetic neuropathy, without long-term current use of insulin ([place]) - Plan: Hemoglobin A1c, Microalbumin / creatinine urine ratio  Vitamin D deficiency - Plan: VITAMIN D 25 Hydroxy (Vit-D Deficiency, Fractures)  Hyperlipidemia LDL goal <130 - Plan: Lipid panel, Hemoglobin A1c, CANCELED: Hemoglobin A1c  Essential hypertension - Plan: Comprehensive metabolic panel  Gouty arthritis of toe of right foot - Plan: Uric acid  Bronchitis with asthma, acute  Major depressive disorder, single episode, in remission ([place]), Chronic  Class 3 severe obesity without serious comorbidity with  body mass index (BMI) of 40.0 to 44.9 in adult, unspecified obesity type ([place]), Chronic  Bronchitis with asthma, acute Advised to rule out COVID 19 INFECTION.  Advised that vaccine may not be 100% effective.  Prednisone, albuterol and tessalon perles prescribed  Diabetes mellitus with neuropathy ([facility])  Historically well-controlled on diet alone .  hemoglobin A1c has been consistently at or  less than 7.0 . Patient is up-to-date on eye exams and foot exam has been postponed due to acute illness,  But she reports mild numbness without skin changes  today. Patient has no microalbuminuria. Patient is tolerating statin therapy for CAD risk reduction and on ACE/ARB for renal protection and hypertension   Lab Results  Component Value Date   HGBA1C 6.1 02/18/2019   Lab Results  Component Value Date   MICROALBUR <0.7 06/10/2018   MICROALBUR <0.7 07/04/2016      Hyperlipidemia LDL goal <130 LDL  Has been at goal with generic Lipitor.  She still has elevated triglycerides, but they are improving with weight loss.  Advised to resume low glycemic index diet. Will repeat now  Lab Results  Component Value Date   CHOL 164 02/18/2019   HDL 40.10 02/18/2019   LDLCALC 70 08/11/2013   LDLDIRECT 92.0 02/18/2019   TRIG 271.0 (H) 02/18/2019   CHOLHDL 4 02/18/2019   Lab Results  Component Value Date   ALT 23 02/18/2019    AST 24 02/18/2019   ALKPHOS 88 02/18/2019   BILITOT 0.6 02/18/2019         Class 3 severe obesity without serious comorbidity with body mass index (BMI) of 40.0 to 44.9 in adult [facility] I have addressed  BMI and recommended a low glycemic index diet utilizing smaller more frequent meals to increase metabolism.  I have also recommended that patient start exercising with a goal of 30 minutes of aerobic exercise a minimum of 5 days per week once she is feeling better   Major depressive disorder, single episode, in remission ([place])   Her symptoms are managed with remeron to 45 mg daily .  No changes today.     I discussed the assessment and treatment plan with the patient. The patient was provided an opportunity to ask questions and all were answered. The patient agreed with the plan and demonstrated an understanding of the instructions.   The patient was advised to call back or seek an in-person evaluation if the symptoms worsen or if the condition fails to improve as anticipated.  I provided  30 minutes of  face-to-face time during this encounter reviewing patient's current problems and past surgeries, labs and imaging studies, providing counseling on the above mentioned problems , and coordination  of care . [name], MD

## 2019-08-18 NOTE — Assessment & Plan Note (Signed)
LDL  Has been at goal with generic Lipitor.  She still has elevated triglycerides, but they are improving with weight loss.  Advised to resume low glycemic index diet. Will repeat now  Lab Results  Component Value Date   CHOL 164 02/18/2019   HDL 40.10 02/18/2019   LDLCALC 70 08/11/2013   LDLDIRECT 92.0 02/18/2019   TRIG 271.0 (H) 02/18/2019   CHOLHDL 4 02/18/2019   Lab Results  Component Value Date   ALT 23 02/18/2019   AST 24 02/18/2019   ALKPHOS 88 02/18/2019   BILITOT 0.6 02/18/2019

## 2019-08-18 NOTE — Telephone Encounter (Signed)
Lm to call office and make a fasting lab appointment on or after April 28th.

## 2019-08-18 NOTE — Assessment & Plan Note (Signed)
Historically well-controlled on diet alone .  hemoglobin A1c has been consistently at or  less than 7.0 . Patient is up-to-date on eye exams and foot exam has been postponed due to acute illness,  But she reports mild numbness without skin changes  today. Patient has no microalbuminuria. Patient is tolerating statin therapy for CAD risk reduction and on ACE/ARB for renal protection and hypertension   Lab Results  Component Value Date   HGBA1C 6.1 02/18/2019   Lab Results  Component Value Date   MICROALBUR <0.7 06/10/2018   MICROALBUR <0.7 07/04/2016

## 2019-08-18 NOTE — Assessment & Plan Note (Signed)
Advised to rule out COVID 19 INFECTION.  Advised that vaccine may not be 100% effective.  Prednisone, albuterol and tessalon perles prescribed

## 2019-08-18 NOTE — Assessment & Plan Note (Signed)
I have addressed  BMI and recommended a low glycemic index diet utilizing smaller more frequent meals to increase metabolism.  I have also recommended that patient start exercising with a goal of 30 minutes of aerobic exercise a minimum of 5 days per week once she is feeling better

## 2019-08-18 NOTE — Patient Instructions (Signed)
You are having ans asthma/bronchitis which may be due to CVOID 19 INFECTION  PLEASE GO GET TESTED TODAY AT [facility].    I am prescribing you a 3 days of 60 mg prednisone  Daily, followed by a 5 day prednisone taper, an albuterol metered dose inhaler to use every 6 hours as needed for chest tightness and wheezing,  and tessalon perles for the cough  Your oxygen level is not dangerously low.  If it drops down to 90% or less and stays, there,  Go to the ER

## 2019-08-20 ENCOUNTER — Ambulatory Visit (INDEPENDENT_AMBULATORY_CARE_PROVIDER_SITE_OTHER): Payer: PPO

## 2019-08-20 VITALS — Ht 62.0 in | Wt 232.0 lb

## 2019-08-20 DIAGNOSIS — Z0000 Encounter for general adult medical examination without abnormal findings: Secondary | ICD-10-CM

## 2019-08-20 NOTE — Progress Notes (Addendum)
Subjective:   Kari [name] is a 74 y.o. female who presents for Medicare Annual (Subsequent) preventive examination.  Review of Systems:  No ROS.  Medicare Wellness Virtual Visit.  Visual/audio telehealth visit, UTA vital signs.  Ht/Wt provided.  See social history for additional risk factors.   Cardiac Risk Factors include: advanced age (>36men, >23 women);hypertension     Objective:     Vitals: Ht 5\' 2"  (1.575 m)   Wt 232 lb (105.2 kg)   BMI 42.43 kg/m   Body mass index is 42.43 kg/m.  Advanced Directives 08/20/2019 06/01/2018 05/28/2017 11/09/2014  Does Patient Have a Medical Advance Directive? Yes Yes Yes Yes  Type of Paramedic of [place];Living will [place];Living will [place];Living will [place];Living will  Does patient want to make changes to medical advance directive? No - Patient declined No - Patient declined No - Patient declined No - Patient declined  Copy of [place] in Chart? No - copy requested No - copy requested No - copy requested No - copy requested    Tobacco Social History   Tobacco Use  Smoking Status Former Smoker  . Quit date: 01/02/1971  . Years since quitting: 48.6  Smokeless Tobacco Never Used     Counseling given: Not Answered   Clinical Intake:  Pre-visit preparation completed: Yes           How often do you need to have someone help you when you read instructions, pamphlets, or other written materials from your doctor or pharmacy?: 1 - Never  Interpreter Needed?: No     Past Medical History:  Diagnosis Date  . Allergy    to cat, dogs, pollen, dust, cigarette smoke  . Arthritis    feet  . Asthma   . Depression   . GERD (gastroesophageal reflux disease)   . Hx of adenomatous colonic polyps 05/30/2010  . Hyperlipidemia   . Hypertension   . Menopause   . Migraines   . Miscarriage    X 1   Past Surgical History:    Procedure Laterality Date  . CESAREAN SECTION     X 2  . DENTAL SURGERY  09/2016  . TUBAL LIGATION  1980   Family History  Problem Relation Age of Onset  . Myasthenia gravis Mother   . Coronary artery disease Father   . Valvular heart disease Father   . Heart attack Father 59  . Cancer Sister 61       colon  . Colon cancer Sister 21  . Heart attack Paternal Grandfather        40's   Social History   Socioeconomic History  . Marital status: Married    Spouse name: Not on file  . Number of children: Not on file  . Years of education: Not on file  . Highest education level: Not on file  Occupational History  . Not on file  Tobacco Use  . Smoking status: Former Smoker    Quit date: 01/02/1971    Years since quitting: 48.6  . Smokeless tobacco: Never Used  Substance and Sexual Activity  . Alcohol use: Yes    Alcohol/week: 0.0 standard drinks    Comment: occasional- wine gives pt headache only drinks once every 6 months  . Drug use: No  . Sexual activity: Not Currently  Other Topics Concern  . Not on file  Social History Narrative  . Not on file  Social Determinants of Health   Financial Resource Strain:   . Difficulty of Paying Living Expenses:   Food Insecurity:   . Worried About Charity fundraiser in the Last Year:   . Arboriculturist in the Last Year:   Transportation Needs:   . Film/video editor (Medical):   Marland Kitchen Lack of Transportation (Non-Medical):   Physical Activity:   . Days of Exercise per Week:   . Minutes of Exercise per Session:   Stress:   . Feeling of Stress :   Social Connections:   . Frequency of Communication with Friends and Family:   . Frequency of Social Gatherings with Friends and Family:   . Attends Religious Services:   . Active Member of Clubs or Organizations:   . Attends Archivist Meetings:   Marland Kitchen Marital Status:     Outpatient Encounter Medications as of 08/20/2019  Medication Sig  . albuterol (VENTOLIN HFA) 108  (90 Base) MCG/ACT inhaler Inhale 2 puffs into the lungs every 6 (six) hours as needed for wheezing or shortness of breath.  . allopurinol (ZYLOPRIM) 300 MG tablet Take 1 tablet (300 mg total) by mouth daily.  Marland Kitchen aspirin 325 MG tablet Take 325 mg by mouth daily.    Marland Kitchen atorvastatin (LIPITOR) 10 MG tablet Take 1 tablet (10 mg total) by mouth daily.  . benzonatate (TESSALON) 200 MG capsule Take 1 capsule (200 mg total) by mouth 3 (three) times daily as needed for cough.  . cetirizine (ZYRTEC) 10 MG tablet Take 10 mg by mouth daily.    . colchicine 0.6 MG tablet Take 1 tablet (0.6 mg total) by mouth 2 (two) times daily. As needed for gout flare.  . diflorasone (PSORCON) 0.05 % cream Apply 1 application topically 2 (two) times daily.  Marland Kitchen esomeprazole (NEXIUM) 20 MG capsule Take 20 mg by mouth daily at 12 noon.  . hydrochlorothiazide (HYDRODIURIL) 25 MG tablet Take 1 tablet (25 mg total) by mouth daily.  Marland Kitchen HYDROcodone-acetaminophen (NORCO) 10-325 MG tablet Take 1 tablet by mouth every 6 (six) hours as needed.  Marland Kitchen losartan (COZAAR) 50 MG tablet Take 1 tablet (50 mg total) by mouth at bedtime.  . mirtazapine (REMERON) 45 MG tablet Take 1 tablet (45 mg total) by mouth at bedtime.  . montelukast (SINGULAIR) 10 MG tablet Take 1 tablet (10 mg total) by mouth at bedtime.  Marland Kitchen OVER THE COUNTER MEDICATION Nasocort nasal spray  . predniSONE (DELTASONE) 10 MG tablet 6 tablets daily for 3 days, then reduce by 1 tablet daily until gone  . Probiotic Product (PROBIOTIC PO) Take by mouth.  . triamcinolone lotion (KENALOG) 0.1 % Apply to affected area(s) twice a day  . [DISCONTINUED] mometasone (NASONEX) 50 MCG/ACT nasal spray Place 2 sprays into the nose daily.   No facility-administered encounter medications on file as of 08/20/2019.    Activities of Daily Living In your present state of health, do you have any difficulty performing the following activities: 08/20/2019  Hearing? N  Vision? N  Difficulty concentrating or  making decisions? N  Walking or climbing stairs? N  Dressing or bathing? N  Doing errands, shopping? N  Preparing Food and eating ? N  Using the Toilet? N  In the past six months, have you accidently leaked urine? N  Do you have problems with loss of bowel control? N  Managing your Medications? N  Managing your Finances? N  Housekeeping or managing your Housekeeping? N  Some recent data  might be hidden    Patient Care Team: [name], MD as PCP - General (Internal Medicine)    Assessment:   This is a routine wellness examination for Shannie.  Nurse connected with patient 08/20/19 at  9:30 AM EDT by a telephone enabled telemedicine application and verified that I am speaking with the correct person using two identifiers. Patient stated full name and DOB. Patient gave permission to continue with virtual visit. Patient's location was at home and Nurse's location was at [place] office.   Patient is alert and oriented x3. Patient denies difficulty focusing or concentrating. Patient works full time, completes crossword puzzles and plays brain challenging games on the computer for for brain health.  Health Maintenance Due: -Tdap vaccine- discussed; to be completed with doctor in visit or local pharmacy at least 4 weeks after covid vaccine series completed.  -Foot Exam- denies wounds or changes. Followed by pcp. -Hgb A1c- 02/18/19 (6.1) See completed HM at the end of note.   Eye: Visual acuity not assessed. Virtual visit. Followed by their ophthalmologist. Cataracts extracted.   Dental: UTD  Hearing: Demonstrates normal hearing during visit.  Safety:  Patient feels safe at home- yes Patient does have smoke detectors at home- yes Patient does wear sunscreen or protective clothing when in direct sunlight - yes Patient does wear seat belt when in a moving vehicle - yes Patient drives- yes Adequate lighting in walkways free from debris- yes Grab bars and handrails used as  appropriate- yes Ambulates with an assistive device- no Cell phone on person when ambulating outside of the home-yes  Social: Alcohol intake - yes      Smoking history- former  Smokers in home? none Illicit drug use? none  Medication: Taking as directed and without issues.  Pill box in use -yes  Self managed - yes   Covid-19: Precautions and sickness symptoms discussed. Wears mask, social distancing, hand hygiene as appropriate.   Activities of Daily Living Patient denies needing assistance with: household chores, feeding themselves, getting from bed to chair, getting to the toilet, bathing/showering, dressing, managing money, or preparing meals.   Discussed the importance of a healthy diet, water intake and the benefits of aerobic exercise.   Physical activity- active around the house.  Diet:  Modified carb diet Water: good intake Caffeine: some ice tea   Other Providers Patient Care Team: [name], MD as PCP - General (Internal Medicine)  Exercise Activities and Dietary recommendations    Goals    . Increase physical activity     Use stationary bike Walk  Exercise 5 days, 30 minutes       Fall Risk Fall Risk  08/20/2019 08/18/2019 01/29/2019 06/01/2018 05/28/2017  Falls in the past year? 0 0 1 0 Yes  Number falls in past yr: - - 0 - 1  Injury with Fall? - - - - No  Follow up Falls evaluation completed Falls evaluation completed Falls evaluation completed - -    Timed Get Up and Go performed: no, virtual visit  Depression Screen PHQ 2/9 Scores 08/18/2019 08/18/2019 12/19/2018 06/01/2018  PHQ - 2 Score 0 0 0 0  PHQ- 9 Score 1 - 0 -   No change since asked 2 days ago.  Cognitive Function MMSE - Mini Mental State Exam 05/28/2017  Orientation to time 5  Orientation to Place 5  Registration 3  Attention/ Calculation 5  Recall 3  Language- name 2 objects 2  Language- repeat 1  Language-  follow 3 step command 3  Language- read & follow direction 1  Write a  sentence 1  Copy design 1  Total score 30     6CIT Screen 08/20/2019 06/01/2018  What Year? 0 points 0 points  What month? 0 points 0 points  What time? 0 points 0 points  Count back from 20 0 points 0 points  Months in reverse 0 points 0 points  Repeat phrase 0 points 0 points  Total Score 0 0    Immunization History  Administered Date(s) Administered  . Influenza, High Dose Seasonal PF 02/24/2018, 03/15/2019  . Influenza,inj,Quad PF,6+ Mos 03/03/2013  . Influenza-Unspecified 01/11/2014, 02/13/2015, 02/27/2016, 03/04/2017  . PFIZER SARS-COV-2 Vaccination 07/02/2019, 07/23/2019  . Pneumococcal Conjugate-13 03/03/2013  . Pneumococcal Polysaccharide-23 08/14/2015  . Tdap 03/03/2008   Screening Tests Health Maintenance  Topic Date Due  . TETANUS/TDAP  03/03/2018  . FOOT EXAM  06/11/2019  . MAMMOGRAM  06/26/2019  . HEMOGLOBIN A1C  08/19/2019  . COLONOSCOPY  11/23/2019  . INFLUENZA VACCINE  12/12/2019  . OPHTHALMOLOGY EXAM  06/17/2020  . DEXA SCAN  Completed  . Hepatitis C Screening  Completed  . PNA vac Low Risk Adult  Completed      Plan:    Keep all routine maintenance appointments.   Fasting lab appointment to be scheduled on or after 09/08/19.   Medicare Attestation I have personally reviewed: The patient's medical and social history Their use of alcohol, tobacco or illicit drugs Their current medications and supplements The patient's functional ability including ADLs,fall risks, home safety risks, cognitive, and hearing and visual impairment Diet and physical activities Evidence for depression   I have reviewed and discussed with patient certain preventive protocols, quality metrics, and best practice recommendations.      Varney Biles, LPN  [id number]   Reviewed above information.  Agree with assessment and plan.    Dr Nicki Reaper

## 2019-08-20 NOTE — Patient Instructions (Addendum)
  Kari [name] , Thank you for taking time to come for your Medicare Wellness Visit. I appreciate your ongoing commitment to your health goals. Please review the following plan we discussed and let me know if I can assist you in the future.   These are the goals we discussed: Goals    . Increase physical activity     Use stationary bike Walk  Exercise 5 days, 30 minutes       This is a list of the screening recommended for you and due dates:  Health Maintenance  Topic Date Due  . Tetanus Vaccine  03/03/2018  . Complete foot exam   06/11/2019  . Mammogram  06/26/2019  . Hemoglobin A1C  08/19/2019  . Colon Cancer Screening  11/23/2019  . Flu Shot  12/12/2019  . Eye exam for diabetics  06/17/2020  . DEXA scan (bone density measurement)  Completed  .  Hepatitis C: One time screening is recommended by Center for Disease Control  (CDC) for  adults born from [birth year] through 1963.   Completed  . Pneumonia vaccines  Completed

## 2019-08-30 ENCOUNTER — Other Ambulatory Visit: Payer: Self-pay

## 2019-08-30 ENCOUNTER — Telehealth: Payer: Self-pay | Admitting: Internal Medicine

## 2019-08-30 DIAGNOSIS — J40 Bronchitis, not specified as acute or chronic: Secondary | ICD-10-CM | POA: Diagnosis not present

## 2019-08-30 DIAGNOSIS — R05 Cough: Secondary | ICD-10-CM | POA: Diagnosis not present

## 2019-08-30 MED ORDER — ALBUTEROL SULFATE HFA 108 (90 BASE) MCG/ACT IN AERS: 2.0000 | INHALATION_SPRAY | Freq: Four times a day (QID) | RESPIRATORY_TRACT | 1 refills | Status: DC | PRN

## 2019-08-30 NOTE — Telephone Encounter (Signed)
Pt called to check If any question please call [phone]

## 2019-08-30 NOTE — Telephone Encounter (Signed)
Pt needs refill on albuterol (VENTOLIN HFA) 108 (90 Base) MCG/ACT inhaler and pt states that she is still short of breath and cough has worsened. Pt not getting much sleep. Even after cough meds and inhaler. Please advise. Please call work [phone]

## 2019-08-30 NOTE — Telephone Encounter (Signed)
Was she covid tested after her visit on April 7th.?  She can't be seen in office she so she will need to go to ER

## 2019-08-30 NOTE — Telephone Encounter (Signed)
Spoke with pt and she stated that she did get covid tested and it was negative. Pt was advised that she needed to be evaluated either at the ER at [facility] since she can not be seen in our office due to the symptoms that she is still having. Pt gave a verbal understanding.

## 2019-09-13 ENCOUNTER — Emergency Department: Payer: PPO

## 2019-09-13 ENCOUNTER — Emergency Department
Admission: EM | Admit: 2019-09-13 | Discharge: 2019-09-13 | Disposition: A | Discharge status: Home / Self Care | Payer: PPO | Attending: Emergency Medicine | Admitting: Emergency Medicine

## 2019-09-13 ENCOUNTER — Other Ambulatory Visit: Payer: Self-pay

## 2019-09-13 ENCOUNTER — Encounter: Payer: Self-pay | Admitting: Emergency Medicine

## 2019-09-13 DIAGNOSIS — R05 Cough: Secondary | ICD-10-CM | POA: Insufficient documentation

## 2019-09-13 DIAGNOSIS — Z7982 Long term (current) use of aspirin: Secondary | ICD-10-CM | POA: Insufficient documentation

## 2019-09-13 DIAGNOSIS — R0602 Shortness of breath: Secondary | ICD-10-CM | POA: Diagnosis not present

## 2019-09-13 DIAGNOSIS — J45909 Unspecified asthma, uncomplicated: Secondary | ICD-10-CM | POA: Diagnosis not present

## 2019-09-13 DIAGNOSIS — R059 Cough, unspecified: Secondary | ICD-10-CM

## 2019-09-13 DIAGNOSIS — R0600 Dyspnea, unspecified: Secondary | ICD-10-CM | POA: Diagnosis not present

## 2019-09-13 DIAGNOSIS — I10 Essential (primary) hypertension: Secondary | ICD-10-CM | POA: Diagnosis not present

## 2019-09-13 DIAGNOSIS — Z79899 Other long term (current) drug therapy: Secondary | ICD-10-CM | POA: Insufficient documentation

## 2019-09-13 DIAGNOSIS — Z87891 Personal history of nicotine dependence: Secondary | ICD-10-CM | POA: Diagnosis not present

## 2019-09-13 DIAGNOSIS — R0989 Other specified symptoms and signs involving the circulatory and respiratory systems: Secondary | ICD-10-CM | POA: Insufficient documentation

## 2019-09-13 LAB — TROPONIN I (HIGH SENSITIVITY): Troponin I (High Sensitivity): 6 ng/L (ref <18)

## 2019-09-13 LAB — BASIC METABOLIC PANEL
Anion gap: 10 (ref 5–15)
BUN: 19 mg/dL (ref 8–23)
CO2: 24 mmol/L (ref 22–32)
Calcium: 9 mg/dL (ref 8.9–10.3)
Chloride: 101 mmol/L (ref 98–111)
Creatinine, Ser: 1.09 mg/dL — ABNORMAL HIGH (ref 0.44–1.00)
GFR calc Af Amer: 59 mL/min — ABNORMAL LOW (ref >60)
GFR calc non Af Amer: 51 mL/min — ABNORMAL LOW (ref >60)
Glucose, Bld: 130 mg/dL — ABNORMAL HIGH (ref 70–99)
Potassium: 3.9 mmol/L (ref 3.5–5.1)
Sodium: 135 mmol/L (ref 135–145)

## 2019-09-13 LAB — FIBRIN DERIVATIVES D-DIMER (ARMC ONLY): Fibrin derivatives D-dimer (ARMC): 678.67 ng/mL (FEU) — ABNORMAL HIGH (ref 0.00–499.00)

## 2019-09-13 LAB — CBC WITH DIFFERENTIAL/PLATELET
Abs Immature Granulocytes: 0.05 10*3/uL (ref 0.00–0.07)
Basophils Absolute: 0.1 10*3/uL (ref 0.0–0.1)
Basophils Relative: 1 %
Eosinophils Absolute: 0.3 10*3/uL (ref 0.0–0.5)
Eosinophils Relative: 3 %
HCT: 35.2 % — ABNORMAL LOW (ref 36.0–46.0)
Hemoglobin: 12.4 g/dL (ref 12.0–15.0)
Immature Granulocytes: 1 %
Lymphocytes Relative: 20 %
Lymphs Abs: 2.1 10*3/uL (ref 0.7–4.0)
MCH: 31.2 pg (ref 26.0–34.0)
MCHC: 35.2 g/dL (ref 30.0–36.0)
MCV: 88.4 fL (ref 80.0–100.0)
Monocytes Absolute: 0.9 10*3/uL (ref 0.1–1.0)
Monocytes Relative: 8 %
Neutro Abs: 6.8 10*3/uL (ref 1.7–7.7)
Neutrophils Relative %: 67 %
Platelets: 213 10*3/uL (ref 150–400)
RBC: 3.98 MIL/uL (ref 3.87–5.11)
RDW: 14.4 % (ref 11.5–15.5)
WBC: 10.2 10*3/uL (ref 4.0–10.5)
nRBC: 0 % (ref 0.0–0.2)

## 2019-09-13 LAB — BRAIN NATRIURETIC PEPTIDE: B Natriuretic Peptide: 87 pg/mL (ref 0.0–100.0)

## 2019-09-13 MED ORDER — IOHEXOL 350 MG/ML SOLN
75.0000 mL | Freq: Once | INTRAVENOUS | Status: AC | PRN
  Administered 2019-09-13: 17:00:00 75 mL via INTRAVENOUS
  Filled 2019-09-13: qty 75

## 2019-09-13 MED ORDER — PROMETHAZINE-PHENYLEPHRINE 6.25-5 MG/5ML PO SYRP: 5.0000 mL | ORAL_SOLUTION | ORAL | 0 refills | Status: DC | PRN

## 2019-09-13 NOTE — ED Triage Notes (Signed)
PT to ED via POV c/o cough. Pt just finished abx and pednisone last week. Pt states that her cough is coming back. Pt states that she coughs up green in the mornings but this clears up throughout the day. Pt is in NAD.

## 2019-09-13 NOTE — ED Provider Notes (Signed)
-----------------------------------------   2:17 PM on 09/13/2019 -----------------------------------------  EKG viewed and interpreted by myself shows a normal sinus rhythm at 82 bpm with a narrow QRS, left axis deviation, largely normal intervals with no concerning ST changes.  Reassuring EKG.   [name], MD 09/13/19 1418

## 2019-09-13 NOTE — ED Notes (Signed)
See triage note  Presents with cough  States she developed cough in March  States has been treat ed by PCP and Urgent Care  Placed on 2 rounds of steroids and cough meds   States cough returned couple of days ago   Denies any fever  But states she is SOB

## 2019-09-13 NOTE — Discharge Instructions (Signed)
Discontinue Tessalon Perles and start taking Bromfed-DM as directed.  Follow-up 1 week with PCP.

## 2019-09-13 NOTE — ED Provider Notes (Signed)
[facility] Department Provider Note   ____________________________________________   First MD Initiated Contact with Patient 09/13/19 1304     (approximate)  I have reviewed the triage vital signs and the nursing notes.   HISTORY  Chief Complaint Cough    HPI Kari [name] is a 74 y.o. female patient presents with 1 month of productive cough.  Patient state he recently finished a course of antibiotics and prednisone.  Patient said after the medicine was stopped her cough returned.  Patient said cough is productive and greenish in color.  Patient stated the greenish color dissipates towards the end of the day.  Patient states there is intermitting mild dyspnea.  Patient denies chest pain.  No palliative measure for complaint.         Past Medical History:  Diagnosis Date  . Allergy    to cat, dogs, pollen, dust, cigarette smoke  . Arthritis    feet  . Asthma   . Depression   . GERD (gastroesophageal reflux disease)   . Hx of adenomatous colonic polyps 05/30/2010  . Hyperlipidemia   . Hypertension   . Menopause   . Migraines   . Miscarriage    X 1    Patient Active Problem List   Diagnosis Date Noted  . Bronchitis with asthma, acute 08/18/2019  . Osteopenia after menopause 12/12/2017  . Diabetes mellitus with neuropathy ([place]) 07/06/2016  . Family history of colon cancer - sister 45's 11/23/2014  . Gouty arthritis of toe of right foot 09/15/2014  . Major depressive disorder, single episode, in remission ([place]) 08/11/2013  . Class 3 severe obesity without serious comorbidity with body mass index (BMI) of 40.0 to 44.9 in adult ([place]) 03/04/2013  . Encounter for Medicare annual wellness exam 08/06/2012  . Palpitations 07/25/2011  . Menopause   . Asthma   . Screening for malignant neoplasm of breast 01/02/2011  . Hearing loss 01/02/2011  . Hx of adenomatous colonic polyps 05/30/2010  . Vitamin D deficiency 08/19/2007  .  POSTMENOPAUSAL STATUS 08/19/2007  . Hyperlipidemia LDL goal <130 08/18/2007  . Essential hypertension 08/18/2007  . ALLERGIC RHINITIS 08/18/2007  . ASTHMA 08/18/2007  . GERD 08/18/2007  . IBS 08/18/2007  . PSORIASIS 08/18/2007  . OSTEOARTHRITIS 08/18/2007  . MIGRAINES, HX OF 08/18/2007    Past Surgical History:  Procedure Laterality Date  . CESAREAN SECTION     X 2  . DENTAL SURGERY  09/2016  . TUBAL LIGATION  1980    Prior to Admission medications   Medication Sig Start Date End Date Taking? Authorizing Provider  albuterol (VENTOLIN HFA) 108 (90 Base) MCG/ACT inhaler Inhale 2 puffs into the lungs every 6 (six) hours as needed for wheezing or shortness of breath. 08/30/19   [name], MD  allopurinol (ZYLOPRIM) 300 MG tablet Take 1 tablet (300 mg total) by mouth daily. 06/28/19   [name], MD  aspirin 325 MG tablet Take 325 mg by mouth daily.      [provider]  atorvastatin (LIPITOR) 10 MG tablet Take 1 tablet (10 mg total) by mouth daily. 07/16/19   [name], MD  benzonatate (TESSALON) 200 MG capsule Take 1 capsule (200 mg total) by mouth 3 (three) times daily as needed for cough. 08/18/19   [name], MD  cetirizine (ZYRTEC) 10 MG tablet Take 10 mg by mouth daily.      [provider]  colchicine 0.6 MG tablet Take 1 tablet (  0.6 mg total) by mouth 2 (two) times daily. As needed for gout flare. 12/11/18   [name], MD  diflorasone (PSORCON) 0.05 % cream Apply 1 application topically 2 (two) times daily. 05/28/17   [name], MD  esomeprazole (NEXIUM) 20 MG capsule Take 20 mg by mouth daily at 12 noon.    [provider]  hydrochlorothiazide (HYDRODIURIL) 25 MG tablet Take 1 tablet (25 mg total) by mouth daily. 07/05/19   [name], MD  losartan (COZAAR) 50 MG tablet Take 1 tablet (50 mg total) by mouth at bedtime. 12/16/18   [name], MD  mirtazapine (REMERON) 45 MG tablet Take 1 tablet (45 mg total) by mouth at  bedtime. 08/04/19   [name], MD  montelukast (SINGULAIR) 10 MG tablet Take 1 tablet (10 mg total) by mouth at bedtime. 07/05/19   [name], MD  OVER THE COUNTER MEDICATION Nasocort nasal spray    [provider]  Probiotic Product (PROBIOTIC PO) Take by mouth.    [provider]  promethazine-phenylephrine (PROMETHAZINE VC) 6.25-5 MG/5ML SYRP Take 5 mLs by mouth every 4 (four) hours as needed for congestion. 09/13/19   Sable Feil, PA-C  triamcinolone lotion (KENALOG) 0.1 % Apply to affected area(s) twice a day 07/21/19   [name], MD  mometasone (NASONEX) 50 MCG/ACT nasal spray Place 2 sprays into the nose daily. 01/02/11 07/24/11  [name], MD    Allergies Nifedipine  Family History  Problem Relation Age of Onset  . Myasthenia gravis Mother   . Coronary artery disease Father   . Valvular heart disease Father   . Heart attack Father 25  . Cancer Sister 63       colon  . Colon cancer Sister 24  . Heart attack Paternal Grandfather        40's    Social History Social History   Tobacco Use  . Smoking status: Former Smoker    Quit date: 01/02/1971    Years since quitting: 48.7  . Smokeless tobacco: Never Used  Substance Use Topics  . Alcohol use: Yes    Alcohol/week: 0.0 standard drinks    Comment: occasional- wine gives pt headache only drinks once every 6 months  . Drug use: No    Review of Systems  Constitutional: No fever/chills Eyes: No visual changes. ENT: No sore throat. Cardiovascular: Denies chest pain. Respiratory: Denies shortness of breath. Gastrointestinal: No abdominal pain.  No nausea, no vomiting.  No diarrhea.  No constipation. Genitourinary: Negative for dysuria. Musculoskeletal: Negative for back pain. Skin: Negative for rash. Neurological: Negative for headaches, focal weakness or numbness. Endocrine:  Hyperlipidemia and hypertension. Allergic/Immunilogical:  Procardia ____________________________________________   PHYSICAL EXAM:  VITAL SIGNS: ED Triage Vitals [09/13/19 1243]  Enc Vitals Group     BP (!) 155/63     Pulse Rate 93     Resp 16     Temp 98.4 F (36.9 C)     Temp Source Oral     SpO2 97 %     Weight 230 lb (104.3 kg)     Height 5\' 4"  (1.626 m)     Head Circumference      Peak Flow      Pain Score 0     Pain Loc      Pain Edu?      Excl. in [place]?     Constitutional: Alert and oriented. Well appearing and in no acute  distress. Nose: No congestion/rhinnorhea. Mouth/Throat: Mucous membranes are moist.  Oropharynx non-erythematous. Neck: No stridor.  Cardiovascular: Normal rate, regular rhythm. Grossly normal heart sounds.  Good peripheral circulation.  Elevated blood pressure Respiratory: Normal respiratory effort.  No retractions. Lungs CTAB. Gastrointestinal: Soft and nontender. No distention. No abdominal bruits. No CVA tenderness. Musculoskeletal: No lower extremity tenderness nor edema.  No joint effusions. Neurologic:  Normal speech and language. No gross focal neurologic deficits are appreciated. No gait instability. Skin:  Skin is warm, dry and intact. No rash noted. Psychiatric: Mood and affect are normal. Speech and behavior are normal.  ____________________________________________   LABS (all labs ordered are listed, but only abnormal results are displayed)  Labs Reviewed  CBC WITH DIFFERENTIAL/PLATELET - Abnormal; Notable for the following components:      Result Value   HCT 35.2 (*)    All other components within normal limits  BASIC METABOLIC PANEL - Abnormal; Notable for the following components:   Glucose, Bld 130 (*)    Creatinine, Ser 1.09 (*)    GFR calc non Af Amer 51 (*)    GFR calc Af Amer 59 (*)    All other components within normal limits  FIBRIN DERIVATIVES D-DIMER ([facility] ONLY) - Abnormal; Notable for the following components:   Fibrin derivatives D-dimer ([facility]) 678.67 (*)    All other  components within normal limits  BRAIN NATRIURETIC PEPTIDE  TROPONIN I (HIGH SENSITIVITY)   ____________________________________________  EKG  Read by heart station Dr., with no acute findings. ____________________________________________  RADIOLOGY  ED MD interpretation:    Official radiology report(s): DG Chest 2 View  Result Date: 09/13/2019 CLINICAL DATA:  Productive cough. EXAM: CHEST - 2 VIEW COMPARISON:  None. FINDINGS: Normal cardiac size. Mild central pulmonary vascular congestion is noted with possible bilateral perihilar edema. The visualized skeletal structures are unremarkable. IMPRESSION: Mild central pulmonary vascular congestion is noted with possible bilateral perihilar edema. Electronically Signed   By: Marijo Conception M.D.   On: 09/13/2019 13:31   CT Angio Chest PE W and/or Wo Contrast  Result Date: 09/13/2019 CLINICAL DATA:  Shortness of breath EXAM: CT ANGIOGRAPHY CHEST WITH CONTRAST TECHNIQUE: Multidetector CT imaging of the chest was performed using the standard protocol during bolus administration of intravenous contrast. Multiplanar CT image reconstructions and MIPs were obtained to evaluate the vascular anatomy. CONTRAST:  12mL OMNIPAQUE IOHEXOL 350 MG/ML SOLN COMPARISON:  Chest x-ray 09/13/2019 FINDINGS: Cardiovascular: Satisfactory opacification of the pulmonary arteries to the segmental level. No evidence of pulmonary embolism. Nonaneurysmal aorta. No dissection is seen. Mild aortic atherosclerosis. Mild coronary vascular calcification. Borderline to mild cardiomegaly. No pericardial effusion. Mediastinum/Nodes: No enlarged mediastinal, hilar, or axillary lymph nodes. Thyroid gland, trachea, and esophagus demonstrate no significant findings. Lungs/Pleura: No acute consolidation or effusion. Dependent atelectasis within both bases. 7 mm average diameter pulmonary nodule in the right middle lobe, series 6, image number 46. Upper Abdomen: Central hepatic cyst  Musculoskeletal: Degenerative changes. No acute osseous abnormality. Review of the MIP images confirms the above findings. IMPRESSION: 1. Negative for acute pulmonary embolus or aortic dissection. 2. 7 mm right middle lobe pulmonary nodule. Non-contrast chest CT at 6-12 months is recommended. If the nodule is stable at time of repeat CT, then future CT at 18-24 months (from today's scan) is considered optional for low-risk patients, but is recommended for high-risk patients. This recommendation follows the consensus statement: Guidelines for Management of Incidental Pulmonary Nodules Detected on CT Images: From the Fleischner Society 2017; Radiology  [id number]. Aortic Atherosclerosis (ICD10-I70.0). Electronically Signed   By: Donavan Foil M.D.   On: 09/13/2019 16:46    ____________________________________________   PROCEDURES  Procedure(s) performed (including Critical Care):  Procedures   ____________________________________________   INITIAL IMPRESSION / ASSESSMENT AND PLAN / ED COURSE  As part of my medical decision making, I reviewed the following data within the [place]     Patient presents with 1 month of productive cough.  Patient state condition improved while taking antibiotics and prednisone.  Patient finished those medications last week.  Patient states today her cough is coming back.  Patient states the cough is greenish in color in the morning but he is clear at the end of the day.  Differential consist of COPD, heart failure, cardiac arrhythmia, PE, or pneumonia.  Further evaluation with chest x-rays and labs is warranted this time.   Discussed x-ray, labs, and CT findings with patient.  Patient given discharge care instructions.  Patient advised to discontinue taking Tessalon Perles and start on Bromfed-DM.  Follow-up with PCP if no improvement 3 to 5 days.  Return to ED if condition worsens.  Kari [name] was evaluated in Emergency Department on  09/13/2019 for the symptoms described in the history of present illness. She was evaluated in the context of the global COVID-19 pandemic, which necessitated consideration that the patient might be at risk for infection with the SARS-CoV-2 virus that causes COVID-19. Institutional protocols and algorithms that pertain to the evaluation of patients at risk for COVID-19 are in a state of rapid change based on information released by regulatory bodies including the CDC and federal and state organizations. These policies and algorithms were followed during the patient's care in the ED.       ____________________________________________   FINAL CLINICAL IMPRESSION(S) / ED DIAGNOSES  Final diagnoses:  Cough  Pulmonary vascular congestion     ED Discharge Orders         Ordered    promethazine-phenylephrine (PROMETHAZINE VC) 6.25-5 MG/5ML SYRP  Every 4 hours PRN     09/13/19 1659           Note:  This document was prepared using Dragon voice recognition software and may include unintentional dictation errors.    Sable Feil, PA-C 09/13/19 1701    Vanessa Aragon, MD 09/14/19 [phone]

## 2019-09-20 ENCOUNTER — Telehealth: Payer: Self-pay | Admitting: Internal Medicine

## 2019-09-20 NOTE — Telephone Encounter (Signed)
Patient went ot Ed, she was given cough medication for only 4 days. She has run out cough medication and needs a refill. Patient has a virtual with Dr. [name] on 09/22/19. The cough medication is promethazine-phenylephrine (PROMETHAZINE VC) 6.25-5 MG/5ML SYRP

## 2019-09-21 MED ORDER — PROMETHAZINE-PHENYLEPHRINE 6.25-5 MG/5ML PO SYRP: 5.0000 mL | ORAL_SOLUTION | ORAL | 0 refills | Status: DC | PRN

## 2019-09-21 NOTE — Telephone Encounter (Signed)
Patient has been informed.

## 2019-09-21 NOTE — Telephone Encounter (Signed)
Cough medication refilled for one week .keep appt. .  She needs to resume zyrtec if not already taking,  And continue singulair as well ,

## 2019-09-22 ENCOUNTER — Telehealth (INDEPENDENT_AMBULATORY_CARE_PROVIDER_SITE_OTHER): Payer: PPO | Admitting: Internal Medicine

## 2019-09-22 ENCOUNTER — Encounter: Payer: Self-pay | Admitting: Internal Medicine

## 2019-09-22 VITALS — Ht 64.0 in | Wt 230.0 lb

## 2019-09-22 DIAGNOSIS — R059 Cough, unspecified: Secondary | ICD-10-CM

## 2019-09-22 DIAGNOSIS — R05 Cough: Secondary | ICD-10-CM

## 2019-09-22 DIAGNOSIS — R911 Solitary pulmonary nodule: Secondary | ICD-10-CM | POA: Diagnosis not present

## 2019-09-22 DIAGNOSIS — J4541 Moderate persistent asthma with (acute) exacerbation: Secondary | ICD-10-CM | POA: Diagnosis not present

## 2019-09-22 DIAGNOSIS — R0601 Orthopnea: Secondary | ICD-10-CM | POA: Diagnosis not present

## 2019-09-22 MED ORDER — BENZONATATE 200 MG PO CAPS
200.0000 mg | ORAL_CAPSULE | Freq: Three times a day (TID) | ORAL | 1 refills | Status: DC | PRN
Start: 2019-09-22 — End: 2022-10-08

## 2019-09-22 MED ORDER — FLUTICASONE-SALMETEROL 250-50 MCG/DOSE IN AEPB
1.0000 | INHALATION_SPRAY | Freq: Two times a day (BID) | RESPIRATORY_TRACT | 3 refills | Status: DC
Start: 2019-09-22 — End: 2020-02-07

## 2019-09-22 NOTE — Assessment & Plan Note (Signed)
Incidental finding o may 2021 CT.  Will need repeat in one year.

## 2019-09-22 NOTE — Progress Notes (Signed)
Virtual Visit via caregility  This visit type was conducted due to national recommendations for restrictions regarding the COVID-19 pandemic (e.g. social distancing).  This format is felt to be most appropriate for this patient at this time.  All issues noted in this document were discussed and addressed.  No physical exam was performed (except for noted visual exam findings with Video Visits).   I connected with@ on 09/22/19 at 10:00 AM EDT by a video enabled telemedicine application  and verified that I am speaking with the correct person using two identifiers. Location patient: home Location provider: work or home office Persons participating in the virtual visit: patient, provider  I discussed the limitations, risks, security and privacy concerns of performing an evaluation and management service by telephone and the availability of in person appointments. I also discussed with the patient that there may be a patient responsible charge related to this service. The patient expressed understanding and agreed to proceed.  Reason for visit: ER follow up on chronic cough  HPI:  74 yr old female with history of asthma, no prior PFTs,   , obesity with  Type 2 DM and gout presents for ER follow up on 6 week  history of persistent cough  Hx: cough started end of march.  Treated via April 7th telephone visit with steroids and cough Suppressants .  COVID NEGATIVE april 7. While taking meds the cough improved transiently,  But recurred so was treated again at urgent care on April 19 for cough with purulent sputam and given  4 days of  azithromycin and prednisone 20 mg daily x 5,  Cough again improved transiently  But returned witih SOB and orthopnea so she went to ER .  Treated again in ER May 3.  CSR and CT angio  Ruled out PE.   Given albuterol and phenergan cough medication. Taking it every 4 hours.  Still coughing, worse with supine position.   Remote tobacco use quit at age 30 .  Taking singulair,  zyrtec and nasonex.    ROS: See pertinent positives and negatives per HPI.  Past Medical History:  Diagnosis Date  . Allergy    to cat, dogs, pollen, dust, cigarette smoke  . Arthritis    feet  . Asthma   . Depression   . GERD (gastroesophageal reflux disease)   . Hx of adenomatous colonic polyps 05/30/2010  . Hyperlipidemia   . Hypertension   . Menopause   . Migraines   . Miscarriage    X 1    Past Surgical History:  Procedure Laterality Date  . CESAREAN SECTION     X 2  . DENTAL SURGERY  09/2016  . TUBAL LIGATION  1980    Family History  Problem Relation Age of Onset  . Myasthenia gravis Mother   . Coronary artery disease Father   . Valvular heart disease Father   . Heart attack Father 13  . Cancer Sister 54       colon  . Colon cancer Sister 37  . Heart attack Paternal Grandfather        83's    SOCIAL HX:  reports that she quit smoking about 48 years ago. She has never used smokeless tobacco. She reports current alcohol use. She reports that she does not use drugs.   Current Outpatient Medications:  .  albuterol (VENTOLIN HFA) 108 (90 Base) MCG/ACT inhaler, Inhale 2 puffs into the lungs every 6 (six) hours as needed for wheezing or shortness  of breath., Disp: 18 g, Rfl: 1 .  allopurinol (ZYLOPRIM) 300 MG tablet, Take 1 tablet (300 mg total) by mouth daily., Disp: 90 tablet, Rfl: 1 .  aspirin 325 MG tablet, Take 325 mg by mouth daily.  , Disp: , Rfl:  .  atorvastatin (LIPITOR) 10 MG tablet, Take 1 tablet (10 mg total) by mouth daily., Disp: 90 tablet, Rfl: 1 .  cetirizine (ZYRTEC) 10 MG tablet, Take 10 mg by mouth daily.  , Disp: , Rfl:  .  colchicine 0.6 MG tablet, Take 1 tablet (0.6 mg total) by mouth 2 (two) times daily. As needed for gout flare., Disp: 60 tablet, Rfl: 2 .  diflorasone (PSORCON) 0.05 % cream, Apply 1 application topically 2 (two) times daily., Disp: 30 g, Rfl: 5 .  esomeprazole (NEXIUM) 20 MG capsule, Take 20 mg by mouth daily at 12 noon.,  Disp: , Rfl:  .  hydrochlorothiazide (HYDRODIURIL) 25 MG tablet, Take 1 tablet (25 mg total) by mouth daily., Disp: 90 tablet, Rfl: 1 .  losartan (COZAAR) 50 MG tablet, Take 1 tablet (50 mg total) by mouth at bedtime., Disp: 90 tablet, Rfl: 3 .  mirtazapine (REMERON) 45 MG tablet, Take 1 tablet (45 mg total) by mouth at bedtime., Disp: 90 tablet, Rfl: 1 .  montelukast (SINGULAIR) 10 MG tablet, Take 1 tablet (10 mg total) by mouth at bedtime., Disp: 90 tablet, Rfl: 1 .  OVER THE COUNTER MEDICATION, Nasocort nasal spray, Disp: , Rfl:  .  Probiotic Product (PROBIOTIC PO), Take by mouth., Disp: , Rfl:  .  promethazine-phenylephrine (PROMETHAZINE VC) 6.25-5 MG/5ML SYRP, Take 5 mLs by mouth every 4 (four) hours as needed for congestion., Disp: 118 mL, Rfl: 0 .  triamcinolone lotion (KENALOG) 0.1 %, Apply to affected area(s) twice a day, Disp: 180 mL, Rfl: 0 .  benzonatate (TESSALON) 200 MG capsule, Take 1 capsule (200 mg total) by mouth 3 (three) times daily as needed for cough., Disp: 60 capsule, Rfl: 1 .  Fluticasone-Salmeterol (ADVAIR DISKUS) 250-50 MCG/DOSE AEPB, Inhale 1 puff into the lungs 2 (two) times daily., Disp: 1 each, Rfl: 3  EXAM:  VITALS per patient if applicable:  GENERAL: alert, oriented, appears well and in no acute distress, but speech is interrupted with cough frequently  HEENT: atraumatic, conjunttiva clear, no obvious abnormalities on inspection of external nose and ears  NECK: normal movements of the head and neck  LUNGS: on inspection no signs of respiratory distress, breathing rate appears normal, no obvious gross SOB, gasping or wheezing  CV: no obvious cyanosis  MS: moves all visible extremities without noticeable abnormality  PSYCH/NEURO: pleasant and cooperative, no obvious depression or anxiety, speech and thought processing grossly intact  ASSESSMENT AND PLAN:  Discussed the following assessment and plan:  Orthopnea - Plan: ECHOCARDIOGRAM COMPLETE  Cough -  Plan: Ambulatory referral to Pulmonology, CANCELED: Ambulatory referral to Pulmonology  Pulmonary nodule  Moderate persistent asthma with acute exacerbation  Pulmonary nodule Incidental finding o may 2021 CT.  Will need repeat in one year.   Asthma Persistent cough for the past 8 weeks.  Adding ICS/LABA and referring to pulmonology .  ECHO ordered as well.    I discussed the assessment and treatment plan with the patient. The patient was provided an opportunity to ask questions and all were answered. The patient agreed with the plan and demonstrated an understanding of the instructions.   The patient was advised to call back or seek an in-person evaluation if the symptoms  worsen or if the condition fails to improve as anticipated.  I provided  30 minutes of non-face-to-face time during this encounter reviewing patient's current problems and past surgeries, labs and imaging studies, providing counseling on the above mentioned problems , and coordination  of care .   [name], MD

## 2019-09-23 NOTE — Assessment & Plan Note (Addendum)
Persistent cough for the past 8 weeks.  Adding ICS/LABA and referring to pulmonology .  ECHO ordered as well.

## 2019-10-12 HISTORY — PX: TRANSTHORACIC ECHOCARDIOGRAM: SHX275

## 2019-10-15 ENCOUNTER — Telehealth (INDEPENDENT_AMBULATORY_CARE_PROVIDER_SITE_OTHER): Payer: PPO | Admitting: Internal Medicine

## 2019-10-15 ENCOUNTER — Encounter: Payer: Self-pay | Admitting: Internal Medicine

## 2019-10-15 ENCOUNTER — Other Ambulatory Visit: Payer: Self-pay | Admitting: Internal Medicine

## 2019-10-15 DIAGNOSIS — J209 Acute bronchitis, unspecified: Secondary | ICD-10-CM

## 2019-10-15 DIAGNOSIS — J45909 Unspecified asthma, uncomplicated: Secondary | ICD-10-CM | POA: Diagnosis not present

## 2019-10-15 DIAGNOSIS — R0600 Dyspnea, unspecified: Secondary | ICD-10-CM

## 2019-10-15 MED ORDER — PREDNISONE 10 MG PO TABS: ORAL_TABLET | ORAL | 0 refills | Status: DC

## 2019-10-15 MED ORDER — HYDROCOD POLST-CPM POLST ER 10-8 MG/5ML PO SUER: 5.0000 mL | Freq: Every evening | ORAL | 0 refills | Status: DC | PRN

## 2019-10-15 MED ORDER — LEVOFLOXACIN 500 MG PO TABS
500.0000 mg | ORAL_TABLET | Freq: Every day | ORAL | 0 refills | Status: DC
Start: 2019-10-15 — End: 2020-08-29

## 2019-10-15 NOTE — Assessment & Plan Note (Addendum)
Recurrent episodes since March despite consistent use of Singulair and Advair .  Patient has not been exposed to pollen and has been isolating at home for months.  Last use of abx was a Zpack prescribed on May 12 by Urgent Care . COVID INFECTION unlikely given her isolation and vaccination status.  Given the development of sinus congestion and purulent sputum,  Will treat with levaquin ,  Prednisone and tussionx.

## 2019-10-15 NOTE — Progress Notes (Signed)
Virtual Visit via [place] Note  This visit type was conducted due to national recommendations for restrictions regarding the COVID-19 pandemic (e.g. social distancing).  This format is felt to be most appropriate for this patient at this time.  All issues noted in this document were discussed and addressed.  No physical exam was performed (except for noted visual exam findings with Video Visits).   I connected with@ on 10/15/19 at  8:00 AM EDT by a video enabled telemedicine application  and verified that I am speaking with the correct person using two identifiers. Location patient: home Location provider: work or home office Persons participating in the virtual visit: patient, provider  I discussed the limitations, risks, security and privacy concerns of performing an evaluation and management service by telephone and the availability of in person appointments. I also discussed with the patient that there may be a patient responsible charge related to this service. The patient expressed understanding and agreed to proceed.  Reason for visit: current cough and wheezing  HPI:  74 yr old female with history of asthma presents with 3 month history of recurrent asthma exacerbations.  Has been treated at least 3 times with steroids,  Once with abc on May 12.  Using Advair and Singulari as directed.  Currently wheezing,  poductive sputum,  Short of breath  And coughing all night long.  COVID VACCINATION STATUS COMPLETE,  Has been isolating at home  No  Outside yard work.  No fevers or body aches    ROS: See pertinent positives and negatives per HPI.  Past Medical History:  Diagnosis Date  . Allergy    to cat, dogs, pollen, dust, cigarette smoke  . Arthritis    feet  . Asthma   . Depression   . GERD (gastroesophageal reflux disease)   . Hx of adenomatous colonic polyps 05/30/2010  . Hyperlipidemia   . Hypertension   . Menopause   . Migraines   . Miscarriage    X 1    Past Surgical  History:  Procedure Laterality Date  . CESAREAN SECTION     X 2  . DENTAL SURGERY  09/2016  . TUBAL LIGATION  1980    Family History  Problem Relation Age of Onset  . Myasthenia gravis Mother   . Coronary artery disease Father   . Valvular heart disease Father   . Heart attack Father 79  . Cancer Sister 49       colon  . Colon cancer Sister 30  . Heart attack Paternal Grandfather        48's    SOCIAL HX: x smoker , quit 48 yrs ago after 9 pack years.  reports that she quit smoking about 48 years ago. She has never used smokeless tobacco. She reports current alcohol use. She reports that she does not use drugs.   Current Outpatient Medications:  .  albuterol (VENTOLIN HFA) 108 (90 Base) MCG/ACT inhaler, Inhale 2 puffs into the lungs every 6 (six) hours as needed for wheezing or shortness of breath., Disp: 18 g, Rfl: 1 .  allopurinol (ZYLOPRIM) 300 MG tablet, Take 1 tablet (300 mg total) by mouth daily., Disp: 90 tablet, Rfl: 1 .  aspirin 325 MG tablet, Take 325 mg by mouth daily.  , Disp: , Rfl:  .  atorvastatin (LIPITOR) 10 MG tablet, Take 1 tablet (10 mg total) by mouth daily., Disp: 90 tablet, Rfl: 1 .  benzonatate (TESSALON) 200 MG capsule, Take 1 capsule (200 mg  total) by mouth 3 (three) times daily as needed for cough., Disp: 60 capsule, Rfl: 1 .  cetirizine (ZYRTEC) 10 MG tablet, Take 10 mg by mouth daily.  , Disp: , Rfl:  .  colchicine 0.6 MG tablet, Take 1 tablet (0.6 mg total) by mouth 2 (two) times daily. As needed for gout flare., Disp: 60 tablet, Rfl: 2 .  diflorasone (PSORCON) 0.05 % cream, Apply 1 application topically 2 (two) times daily., Disp: 30 g, Rfl: 5 .  esomeprazole (NEXIUM) 20 MG capsule, Take 20 mg by mouth daily at 12 noon., Disp: , Rfl:  .  Fluticasone-Salmeterol (ADVAIR DISKUS) 250-50 MCG/DOSE AEPB, Inhale 1 puff into the lungs 2 (two) times daily., Disp: 1 each, Rfl: 3 .  hydrochlorothiazide (HYDRODIURIL) 25 MG tablet, Take 1 tablet (25 mg total) by  mouth daily., Disp: 90 tablet, Rfl: 1 .  losartan (COZAAR) 50 MG tablet, Take 1 tablet (50 mg total) by mouth at bedtime., Disp: 90 tablet, Rfl: 3 .  mirtazapine (REMERON) 45 MG tablet, Take 1 tablet (45 mg total) by mouth at bedtime., Disp: 90 tablet, Rfl: 1 .  montelukast (SINGULAIR) 10 MG tablet, Take 1 tablet (10 mg total) by mouth at bedtime., Disp: 90 tablet, Rfl: 1 .  OVER THE COUNTER MEDICATION, Nasocort nasal spray, Disp: , Rfl:  .  Probiotic Product (PROBIOTIC PO), Take by mouth., Disp: , Rfl:  .  promethazine-phenylephrine (PROMETHAZINE VC) 6.25-5 MG/5ML SYRP, Take 5 mLs by mouth every 4 (four) hours as needed for congestion., Disp: 118 mL, Rfl: 0 .  triamcinolone lotion (KENALOG) 0.1 %, Apply to affected area(s) twice a day, Disp: 180 mL, Rfl: 0 .  chlorpheniramine-HYDROcodone (TUSSIONEX PENNKINETIC ER) 10-8 MG/5ML SUER, Take 5 mLs by mouth at bedtime as needed., Disp: 140 mL, Rfl: 0 .  levofloxacin (LEVAQUIN) 500 MG tablet, Take 1 tablet (500 mg total) by mouth daily., Disp: 7 tablet, Rfl: 0 .  predniSONE (DELTASONE) 10 MG tablet, 6 tablets daily for 3 days,   then reduce by 1 tablet daily until gone, Disp: 33 tablet, Rfl: 0  EXAM:  VITALS per patient if applicable:  GENERAL: alert, oriented, appears well and in no acute distress  HEENT: atraumatic, conjunttiva clear, no obvious abnormalities on inspection of external nose and ears  NECK: normal movements of the head and neck  LUNGS: on inspection no signs of respiratory distress, breathing rate appears normal, no obvious gross SOB, gasping or wheezing  CV: no obvious cyanosis  MS: moves all visible extremities without noticeable abnormality  PSYCH/NEURO: pleasant and cooperative, no obvious depression or anxiety, speech and thought processing grossly intact  ASSESSMENT AND PLAN:  Discussed the following assessment and plan:  Bronchitis with asthma, acute  Bronchitis with asthma, acute Recurrent episodes since March  despite consistent use of Singulair and Advair .  Patient has not been exposed to pollen and has been isolating at home for months.  Last use of abx was a Zpack prescribed on May 12 by Urgent Care . COVID INFECTION unlikely given her isolation and vaccination status.  Given the development of sinus congestion and purulent sputum,  Will treat with levaquin ,  Prednisone and tussionx.     I discussed the assessment and treatment plan with the patient. The patient was provided an opportunity to ask questions and all were answered. The patient agreed with the plan and demonstrated an understanding of the instructions.   The patient was advised to call back or seek an in-person evaluation if  the symptoms worsen or if the condition fails to improve as anticipated.  I provided  30 minutes of  face-to-face time during this encounter reviewing patient's current problems and past surgeries, labs and imaging studies, providing counseling on the above mentioned problems , and coordination  of care .   [name], MD

## 2019-10-18 DIAGNOSIS — J4540 Moderate persistent asthma, uncomplicated: Secondary | ICD-10-CM | POA: Diagnosis not present

## 2019-10-18 DIAGNOSIS — R05 Cough: Secondary | ICD-10-CM | POA: Diagnosis not present

## 2019-10-18 DIAGNOSIS — J310 Chronic rhinitis: Secondary | ICD-10-CM | POA: Diagnosis not present

## 2019-10-18 DIAGNOSIS — K219 Gastro-esophageal reflux disease without esophagitis: Secondary | ICD-10-CM | POA: Diagnosis not present

## 2019-10-18 DIAGNOSIS — Z87898 Personal history of other specified conditions: Secondary | ICD-10-CM | POA: Diagnosis not present

## 2019-10-18 DIAGNOSIS — R0600 Dyspnea, unspecified: Secondary | ICD-10-CM | POA: Diagnosis not present

## 2019-11-01 DIAGNOSIS — J310 Chronic rhinitis: Secondary | ICD-10-CM | POA: Diagnosis not present

## 2019-11-01 DIAGNOSIS — R05 Cough: Secondary | ICD-10-CM | POA: Diagnosis not present

## 2019-11-01 DIAGNOSIS — K219 Gastro-esophageal reflux disease without esophagitis: Secondary | ICD-10-CM | POA: Diagnosis not present

## 2019-11-04 ENCOUNTER — Ambulatory Visit (INDEPENDENT_AMBULATORY_CARE_PROVIDER_SITE_OTHER): Payer: PPO

## 2019-11-04 ENCOUNTER — Other Ambulatory Visit: Payer: Self-pay

## 2019-11-04 DIAGNOSIS — R0600 Dyspnea, unspecified: Secondary | ICD-10-CM | POA: Diagnosis not present

## 2019-11-04 HISTORY — PX: TRANSTHORACIC ECHOCARDIOGRAM: SHX275

## 2019-11-17 ENCOUNTER — Institutional Professional Consult (permissible substitution): Payer: PPO | Admitting: Pulmonary Disease

## 2019-12-14 DIAGNOSIS — R0600 Dyspnea, unspecified: Secondary | ICD-10-CM | POA: Diagnosis not present

## 2019-12-14 DIAGNOSIS — J4540 Moderate persistent asthma, uncomplicated: Secondary | ICD-10-CM | POA: Diagnosis not present

## 2019-12-14 DIAGNOSIS — G479 Sleep disorder, unspecified: Secondary | ICD-10-CM | POA: Diagnosis not present

## 2019-12-14 DIAGNOSIS — J310 Chronic rhinitis: Secondary | ICD-10-CM | POA: Diagnosis not present

## 2019-12-30 ENCOUNTER — Other Ambulatory Visit: Payer: Self-pay

## 2019-12-30 DIAGNOSIS — M109 Gout, unspecified: Secondary | ICD-10-CM

## 2019-12-30 MED ORDER — ALLOPURINOL 300 MG PO TABS: 300.0000 mg | ORAL_TABLET | Freq: Every day | ORAL | 1 refills | Status: DC

## 2019-12-30 MED ORDER — MONTELUKAST SODIUM 10 MG PO TABS: 10.0000 mg | ORAL_TABLET | Freq: Every day | ORAL | 1 refills | Status: DC

## 2019-12-30 MED ORDER — LOSARTAN POTASSIUM 50 MG PO TABS: 50.0000 mg | ORAL_TABLET | Freq: Every day | ORAL | 1 refills | Status: DC

## 2019-12-30 MED ORDER — ATORVASTATIN CALCIUM 10 MG PO TABS: 10.0000 mg | ORAL_TABLET | Freq: Every day | ORAL | 1 refills | Status: DC

## 2019-12-30 MED ORDER — HYDROCHLOROTHIAZIDE 25 MG PO TABS: 25.0000 mg | ORAL_TABLET | Freq: Every day | ORAL | 1 refills | Status: DC

## 2020-02-01 DIAGNOSIS — E663 Overweight: Secondary | ICD-10-CM | POA: Diagnosis not present

## 2020-02-01 DIAGNOSIS — G4733 Obstructive sleep apnea (adult) (pediatric): Secondary | ICD-10-CM | POA: Diagnosis not present

## 2020-02-01 DIAGNOSIS — G4701 Insomnia due to medical condition: Secondary | ICD-10-CM | POA: Diagnosis not present

## 2020-02-01 DIAGNOSIS — R0600 Dyspnea, unspecified: Secondary | ICD-10-CM | POA: Diagnosis not present

## 2020-02-01 DIAGNOSIS — J4520 Mild intermittent asthma, uncomplicated: Secondary | ICD-10-CM | POA: Diagnosis not present

## 2020-02-05 ENCOUNTER — Other Ambulatory Visit: Payer: Self-pay | Admitting: Internal Medicine

## 2020-02-17 ENCOUNTER — Other Ambulatory Visit: Payer: Self-pay

## 2020-02-17 MED ORDER — MIRTAZAPINE 45 MG PO TABS: 45.0000 mg | ORAL_TABLET | Freq: Every day | ORAL | 1 refills | Status: DC

## 2020-05-02 DIAGNOSIS — E663 Overweight: Secondary | ICD-10-CM | POA: Diagnosis not present

## 2020-05-02 DIAGNOSIS — R0600 Dyspnea, unspecified: Secondary | ICD-10-CM | POA: Diagnosis not present

## 2020-05-02 DIAGNOSIS — J4530 Mild persistent asthma, uncomplicated: Secondary | ICD-10-CM | POA: Diagnosis not present

## 2020-06-10 DIAGNOSIS — Z20822 Contact with and (suspected) exposure to covid-19: Secondary | ICD-10-CM | POA: Diagnosis not present

## 2020-06-14 DIAGNOSIS — Z20822 Contact with and (suspected) exposure to covid-19: Secondary | ICD-10-CM | POA: Diagnosis not present

## 2020-06-19 DIAGNOSIS — H02831 Dermatochalasis of right upper eyelid: Secondary | ICD-10-CM | POA: Diagnosis not present

## 2020-06-19 DIAGNOSIS — H52223 Regular astigmatism, bilateral: Secondary | ICD-10-CM | POA: Diagnosis not present

## 2020-06-19 DIAGNOSIS — H02834 Dermatochalasis of left upper eyelid: Secondary | ICD-10-CM | POA: Diagnosis not present

## 2020-06-19 DIAGNOSIS — H524 Presbyopia: Secondary | ICD-10-CM | POA: Diagnosis not present

## 2020-06-19 DIAGNOSIS — D3132 Benign neoplasm of left choroid: Secondary | ICD-10-CM | POA: Diagnosis not present

## 2020-06-19 DIAGNOSIS — H5203 Hypermetropia, bilateral: Secondary | ICD-10-CM | POA: Diagnosis not present

## 2020-06-19 DIAGNOSIS — H35372 Puckering of macula, left eye: Secondary | ICD-10-CM | POA: Diagnosis not present

## 2020-06-19 LAB — HM DIABETES EYE EXAM

## 2020-07-31 ENCOUNTER — Other Ambulatory Visit: Payer: Self-pay | Admitting: Internal Medicine

## 2020-07-31 DIAGNOSIS — M109 Gout, unspecified: Secondary | ICD-10-CM

## 2020-08-02 DIAGNOSIS — R0600 Dyspnea, unspecified: Secondary | ICD-10-CM | POA: Diagnosis not present

## 2020-08-02 DIAGNOSIS — J310 Chronic rhinitis: Secondary | ICD-10-CM | POA: Diagnosis not present

## 2020-08-02 DIAGNOSIS — J4530 Mild persistent asthma, uncomplicated: Secondary | ICD-10-CM | POA: Diagnosis not present

## 2020-08-02 DIAGNOSIS — E663 Overweight: Secondary | ICD-10-CM | POA: Diagnosis not present

## 2020-08-02 DIAGNOSIS — K219 Gastro-esophageal reflux disease without esophagitis: Secondary | ICD-10-CM | POA: Diagnosis not present

## 2020-08-22 ENCOUNTER — Ambulatory Visit: Payer: PPO

## 2020-08-29 ENCOUNTER — Ambulatory Visit (INDEPENDENT_AMBULATORY_CARE_PROVIDER_SITE_OTHER): Payer: PPO

## 2020-08-29 ENCOUNTER — Other Ambulatory Visit: Payer: Self-pay | Admitting: Internal Medicine

## 2020-08-29 ENCOUNTER — Other Ambulatory Visit: Payer: Self-pay

## 2020-08-29 VITALS — BP 120/78 | HR 81 | Resp 16 | Ht 64.0 in | Wt 244.8 lb

## 2020-08-29 DIAGNOSIS — Z1231 Encounter for screening mammogram for malignant neoplasm of breast: Secondary | ICD-10-CM

## 2020-08-29 DIAGNOSIS — Z1211 Encounter for screening for malignant neoplasm of colon: Secondary | ICD-10-CM | POA: Diagnosis not present

## 2020-08-29 DIAGNOSIS — Z0000 Encounter for general adult medical examination without abnormal findings: Secondary | ICD-10-CM | POA: Diagnosis not present

## 2020-08-29 NOTE — Patient Instructions (Addendum)
Ms. Gangi , Thank you for taking time to come for your Medicare Wellness Visit. I appreciate your ongoing commitment to your health goals. Please review the following plan we discussed and let me know if I can assist you in the future.   These are the goals we discussed: Goals    . Increase physical activity     Use stationary bike Walk  Exercise 5 days, 30 minutes       This is a list of the screening recommended for you and due dates:  Health Maintenance  Topic Date Due  . Mammogram  06/26/2019  . Colon Cancer Screening  11/23/2019  . Complete foot exam   09/08/2020*  . Hemoglobin A1C  09/08/2020*  . Tetanus Vaccine  08/29/2021*  . Flu Shot  12/11/2020  . Eye exam for diabetics  05/27/2021  . DEXA scan (bone density measurement)  Completed  . COVID-19 Vaccine  Completed  .  Hepatitis C: One time screening is recommended by Center for Disease Control  (CDC) for  adults born from [birth year] through 1963.   Completed  . Pneumonia vaccines  Completed  . HPV Vaccine  Aged Out  *Topic was postponed. The date shown is not the original due date.   Immunizations Immunization History  Administered Date(s) Administered  . Influenza, High Dose Seasonal PF 02/24/2018, 03/15/2019  . Influenza,inj,Quad PF,6+ Mos 03/03/2013  . Influenza-Unspecified 01/11/2014, 02/13/2015, 02/27/2016, 03/04/2017  . PFIZER(Purple Top)SARS-COV-2 Vaccination 07/02/2019, 07/23/2019  . Pneumococcal Conjugate-13 03/03/2013  . Pneumococcal Polysaccharide-23 08/14/2015  . Tdap 03/03/2008   Keep all routine maintenance appointments.   Follow up 09/08/20 @ 2:00  Colonoscopy ordered with [facility] per request.  Mammogram ordered with [facility] per request.   Advanced directives: End of life planning; Advance aging; Advanced directives discussed.  Copy of current HCPOA/Living Will requested.    Conditions/risks identified: c/o ankle/feet swelling. Appointment scheduled with pcp next week.  Diabetic foot exam due.   Follow up in one year for your annual wellness visit.    Preventive Care 79 Years and Older, Female Preventive care refers to lifestyle choices and visits with your health care provider that can promote health and wellness. What does preventive care include?  A yearly physical exam. This is also called an annual well check.  Dental exams once or twice a year.  Routine eye exams. Ask your health care provider how often you should have your eyes checked.  Personal lifestyle choices, including:  Daily care of your teeth and gums.  Regular physical activity.  Eating a healthy diet.  Avoiding tobacco and drug use.  Limiting alcohol use.  Practicing safe sex.  Taking low-dose aspirin every day.  Taking vitamin and mineral supplements as recommended by your health care provider. What happens during an annual well check? The services and screenings done by your health care provider during your annual well check will depend on your age, overall health, lifestyle risk factors, and family history of disease. Counseling  Your health care provider may ask you questions about your:  Alcohol use.  Tobacco use.  Drug use.  Emotional well-being.  Home and relationship well-being.  Sexual activity.  Eating habits.  History of falls.  Memory and ability to understand (cognition).  Work and work Statistician.  Reproductive health. Screening  You may have the following tests or measurements:  Height, weight, and BMI.  Blood pressure.  Lipid and cholesterol levels. These may be checked every 5 years, or more frequently if  you are over 36 years old.  Skin check.  Lung cancer screening. You may have this screening every year starting at age 53 if you have a 30-pack-year history of smoking and currently smoke or have quit within the past 15 years.  Fecal occult blood test (FOBT) of the stool. You may have this test every year starting at age  8.  Flexible sigmoidoscopy or colonoscopy. You may have a sigmoidoscopy every 5 years or a colonoscopy every 10 years starting at age 25.  Hepatitis C blood test.  Hepatitis B blood test.  Sexually transmitted disease (STD) testing.  Diabetes screening. This is done by checking your blood sugar (glucose) after you have not eaten for a while (fasting). You may have this done every 1-3 years.  Bone density scan. This is done to screen for osteoporosis. You may have this done starting at age 39.  Mammogram. This may be done every 1-2 years. Talk to your health care provider about how often you should have regular mammograms. Talk with your health care provider about your test results, treatment options, and if necessary, the need for more tests. Vaccines  Your health care provider may recommend certain vaccines, such as:  Influenza vaccine. This is recommended every year.  Tetanus, diphtheria, and acellular pertussis (Tdap, Td) vaccine. You may need a Td booster every 10 years.  Zoster vaccine. You may need this after age 94.  Pneumococcal 13-valent conjugate (PCV13) vaccine. One dose is recommended after age 40.  Pneumococcal polysaccharide (PPSV23) vaccine. One dose is recommended after age 37. Talk to your health care provider about which screenings and vaccines you need and how often you need them. This information is not intended to replace advice given to you by your health care provider. Make sure you discuss any questions you have with your health care provider. Document Released: 05/26/2015 Document Revised: 01/17/2016 Document Reviewed: 02/28/2015 Elsevier Interactive Patient Education  2017 [place] Prevention in the Home Falls can cause injuries. They can happen to people of all ages. There are many things you can do to make your home safe and to help prevent falls. What can I do on the outside of my home?  Regularly fix the edges of walkways and driveways  and fix any cracks.  Remove anything that might make you trip as you walk through a door, such as a raised step or threshold.  Trim any bushes or trees on the path to your home.  Use bright outdoor lighting.  Clear any walking paths of anything that might make someone trip, such as rocks or tools.  Regularly check to see if handrails are loose or broken. Make sure that both sides of any steps have handrails.  Any raised decks and porches should have guardrails on the edges.  Have any leaves, snow, or ice cleared regularly.  Use sand or salt on walking paths during winter.  Clean up any spills in your garage right away. This includes oil or grease spills. What can I do in the bathroom?  Use night lights.  Install grab bars by the toilet and in the tub and shower. Do not use towel bars as grab bars.  Use non-skid mats or decals in the tub or shower.  If you need to sit down in the shower, use a plastic, non-slip stool.  Keep the floor dry. Clean up any water that spills on the floor as soon as it happens.  Remove soap buildup in the tub or  shower regularly.  Attach bath mats securely with double-sided non-slip rug tape.  Do not have throw rugs and other things on the floor that can make you trip. What can I do in the bedroom?  Use night lights.  Make sure that you have a light by your bed that is easy to reach.  Do not use any sheets or blankets that are too big for your bed. They should not hang down onto the floor.  Have a firm chair that has side arms. You can use this for support while you get dressed.  Do not have throw rugs and other things on the floor that can make you trip. What can I do in the kitchen?  Clean up any spills right away.  Avoid walking on wet floors.  Keep items that you use a lot in easy-to-reach places.  If you need to reach something above you, use a strong step stool that has a grab bar.  Keep electrical cords out of the way.  Do  not use floor polish or wax that makes floors slippery. If you must use wax, use non-skid floor wax.  Do not have throw rugs and other things on the floor that can make you trip. What can I do with my stairs?  Do not leave any items on the stairs.  Make sure that there are handrails on both sides of the stairs and use them. Fix handrails that are broken or loose. Make sure that handrails are as long as the stairways.  Check any carpeting to make sure that it is firmly attached to the stairs. Fix any carpet that is loose or worn.  Avoid having throw rugs at the top or bottom of the stairs. If you do have throw rugs, attach them to the floor with carpet tape.  Make sure that you have a light switch at the top of the stairs and the bottom of the stairs. If you do not have them, ask someone to add them for you. What else can I do to help prevent falls?  Wear shoes that:  Do not have high heels.  Have rubber bottoms.  Are comfortable and fit you well.  Are closed at the toe. Do not wear sandals.  If you use a stepladder:  Make sure that it is fully opened. Do not climb a closed stepladder.  Make sure that both sides of the stepladder are locked into place.  Ask someone to hold it for you, if possible.  Clearly mark and make sure that you can see:  Any grab bars or handrails.  First and last steps.  Where the edge of each step is.  Use tools that help you move around (mobility aids) if they are needed. These include:  Canes.  Walkers.  Scooters.  Crutches.  Turn on the lights when you go into a dark area. Replace any light bulbs as soon as they burn out.  Set up your furniture so you have a clear path. Avoid moving your furniture around.  If any of your floors are uneven, fix them.  If there are any pets around you, be aware of where they are.  Review your medicines with your doctor. Some medicines can make you feel dizzy. This can increase your chance of  falling. Ask your doctor what other things that you can do to help prevent falls. This information is not intended to replace advice given to you by your health care provider. Make sure you discuss any  questions you have with your health care provider. Document Released: 02/23/2009 Document Revised: 10/05/2015 Document Reviewed: 06/03/2014 Elsevier Interactive Patient Education  2017 [place].  Colonoscopy, Adult A colonoscopy is a procedure to look at the entire large intestine. This procedure is done using a long, thin, flexible tube that has a camera on the end. You may have a colonoscopy:  As a part of normal colorectal screening.  If you have certain symptoms, such as: ? A low number of red blood cells in your blood (anemia). ? Diarrhea that does not go away. ? Pain in your abdomen. ? Blood in your stool. A colonoscopy can help screen for and diagnose medical problems, including:  Tumors.  Extra tissue that grows where mucus forms (polyps).  Inflammation.  Areas of bleeding. Tell your health care provider about:  Any allergies you have.  All medicines you are taking, including vitamins, herbs, eye drops, creams, and over-the-counter medicines.  Any problems you or family members have had with anesthetic medicines.  Any blood disorders you have.  Any surgeries you have had.  Any medical conditions you have.  Any problems you have had with having bowel movements.  Whether you are pregnant or may be pregnant. What are the risks? Generally, this is a safe procedure. However, problems may occur, including:  Bleeding.  Damage to your intestine.  Allergic reactions to medicines given during the procedure.  Infection. This is rare. What happens before the procedure? Eating and drinking restrictions Follow instructions from your health care provider about eating or drinking restrictions, which may include:  A few days before the procedure: ? Follow a  low-fiber diet. ? Avoid nuts, seeds, dried fruit, raw fruits, and vegetables.  1-3 days before the procedure: ? Eat only gelatin dessert or ice pops. ? Drink only clear liquids, such as water, clear juice, clear broth or bouillon, black coffee or tea, or clear soft drinks or sports drinks. ? Avoid liquids that contain red or purple dye.  The day of the procedure: ? Do not eat solid foods. You may continue to drink clear liquids until up to 2 hours before the procedure. ? Do not eat or drink anything starting 2 hours before the procedure, or within the time period that your health care provider recommends. Bowel prep If you were prescribed a bowel prep to take by mouth (orally) to clean out your colon:  Take it as told by your health care provider. Starting the day before your procedure, you will need to drink a large amount of liquid medicine. The liquid will cause you to have many bowel movements of loose stool until your stool becomes almost clear or light green.  If your skin or the opening between the buttocks (anus) gets irritated from diarrhea, you may relieve the irritation using: ? Wipes with medicine in them, such as adult wet wipes with aloe and vitamin E. ? A product to soothe skin, such as petroleum jelly.  If you vomit while drinking the bowel prep: ? Take a break for up to 60 minutes. ? Begin the bowel prep again. ? Call your health care provider if you keep vomiting or you cannot take the bowel prep without vomiting.  To clean out your colon, you may also be given: ? Laxative medicines. These help you have a bowel movement. ? Instructions for enema use. An enema is liquid medicine injected into your rectum. Medicines Ask your health care provider about:  Changing or stopping your regular medicines or  supplements. This is especially important if you are taking iron supplements, diabetes medicines, or blood thinners.  Taking medicines such as aspirin and ibuprofen. These  medicines can thin your blood. Do not take these medicines unless your health care provider tells you to take them.  Taking over-the-counter medicines, vitamins, herbs, and supplements. General instructions  Ask your health care provider what steps will be taken to help prevent infection. These may include washing skin with a germ-killing soap.  Plan to have someone take you home from the hospital or clinic. What happens during the procedure?  An IV will be inserted into one of your veins.  You may be given one or more of the following: ? A medicine to help you relax (sedative). ? A medicine to numb the area (local anesthetic). ? A medicine to make you fall asleep (general anesthetic). This is rarely needed.  You will lie on your side with your knees bent.  The tube will: ? Have oil or gel put on it (be lubricated). ? Be inserted into your anus. ? Be gently eased through all parts of your large intestine.  Air will be sent into your colon to keep it open. This may cause some pressure or cramping.  Images will be taken with the camera and will appear on a screen.  A small tissue sample may be removed to be looked at under a microscope (biopsy). The tissue may be sent to a lab for testing if any signs of problems are found.  If small polyps are found, they may be removed and checked for cancer cells.  When the procedure is finished, the tube will be removed. The procedure may vary among health care providers and hospitals.   What happens after the procedure?  Your blood pressure, heart rate, breathing rate, and blood oxygen level will be monitored until you leave the hospital or clinic.  You may have a small amount of blood in your stool.  You may pass gas and have mild cramping or bloating in your abdomen. This is caused by the air that was used to open your colon during the exam.  Do not drive for 24 hours after the procedure.  It is up to you to get the results of your  procedure. Ask your health care provider, or the department that is doing the procedure, when your results will be ready. Summary  A colonoscopy is a procedure to look at the entire large intestine.  Follow instructions from your health care provider about eating and drinking before the procedure.  If you were prescribed an oral bowel prep to clean out your colon, take it as told by your health care provider.  During the colonoscopy, a flexible tube with a camera on its end is inserted into the anus and then passed into the other parts of the large intestine. This information is not intended to replace advice given to you by your health care provider. Make sure you discuss any questions you have with your health care provider. Document Revised: 11/20/2018 Document Reviewed: 11/20/2018 Elsevier Patient Education  [place] A mammogram is a low energy X-ray of the breasts that is done to check for abnormal changes. This procedure can screen for and detect any changes that may indicate breast cancer. Mammograms are regularly done on women. A man may have a mammogram if he has a lump or swelling in his breast. A mammogram can also identify other changes and variations in the  breast, such as:  Inflammation of the breast tissue (mastitis).  An infected area that contains a collection of pus (abscess).  A fluid-filled sac (cyst).  Fibrocystic changes. This is when breast tissue becomes denser, which can make the tissue feel rope-like or uneven under the skin.  Tumors that are not cancerous (benign). Tell a health care provider:  About any allergies you have.  If you have breast implants.  If you have had previous breast disease, biopsy, or surgery.  If you are breastfeeding.  If you are younger than age 58.  If you have a family history of breast cancer.  Whether you are pregnant or may be pregnant. What are the risks? Generally, this is a safe procedure.  However, problems may occur, including:  Exposure to radiation. Radiation levels are very low with this test.  The results being misinterpreted.  The need for further tests.  The inability of the mammogram to detect certain cancers. What happens before the procedure?  Schedule your test about 1-2 weeks after your menstrual period if you are still menstruating. This is usually when your breasts are the least tender.  If you have had a mammogram done at a different facility in the past, get the mammogram X-rays or have them sent to your current exam facility. The new and old images will be compared.  Wash your breasts and underarms on the day of the test.  Do not wear deodorants, perfumes, lotions, or powders anywhere on your body on the day of the test.  Remove any jewelry from your neck.  Wear clothes that you can change into and out of easily. What happens during the procedure?  You will undress from the waist up and put on a gown that opens in the front.  You will stand in front of the X-ray machine.  Each breast will be placed between two plastic or glass plates. The plates will compress your breast for a few seconds. Try to stay as relaxed as possible during the procedure. This does not cause any harm to your breasts and any discomfort you feel will be very brief.  X-rays will be taken from different angles of each breast. The procedure may vary among health care providers and hospitals.   What happens after the procedure?  The mammogram will be examined by a specialist (radiologist).  You may need to repeat certain parts of the test, depending on the quality of the images. This is commonly done if the radiologist needs a better view of the breast tissue.  You may resume your normal activities.  It is up to you to get the results of your procedure. Ask your health care provider, or the department that is doing the procedure, when your results will be ready. Summary  A  mammogram is a low energy X-ray of the breasts that is done to check for abnormal changes. A man may have a mammogram if he has a lump or swelling in his breast.  If you have had a mammogram done at a different facility in the past, get the mammogram X-rays or have them sent to your current exam facility in order to compare them.  Schedule your test about 1-2 weeks after your menstrual period if you are still menstruating.  For this test, each breast will be placed between two plastic or glass plates. The plates will compress your breast for a few seconds.  Ask when your test results will be ready. Make sure you get your test  results. This information is not intended to replace advice given to you by your health care provider. Make sure you discuss any questions you have with your health care provider. Document Revised: 12/18/2017 Document Reviewed: 12/18/2017 Elsevier Patient Education  [place].

## 2020-08-29 NOTE — Progress Notes (Addendum)
Subjective:   Kari [name] is a 75 y.o. female who presents for Medicare Annual (Subsequent) preventive examination.  Review of Systems     No ROS.  Medicare Wellness Virtual Visit.  See social history for additional risk factors.   Cardiac Risk Factors include: advanced age (>6men, >47 women);diabetes mellitus;hypertension     Objective:    Today's Vitals   08/29/20 1505  BP: 120/78  Pulse: 81  Resp: 16  SpO2: 99%  Weight: 244 lb 12.8 oz (111 kg)  Height: 5\' 4"  (1.626 m)   Body mass index is 42.02 kg/m.  Advanced Directives 08/29/2020 09/13/2019 08/20/2019 06/01/2018 05/28/2017 11/09/2014  Does Patient Have a Medical Advance Directive? Yes No Yes Yes Yes Yes  Type of [employer] [place];Living will [place];Living will [place];Living will [place];Living will  Does patient want to make changes to medical advance directive? No - Patient declined - No - Patient declined No - Patient declined No - Patient declined No - Patient declined  Copy of [place] in Chart? No - copy requested - No - copy requested No - copy requested No - copy requested No - copy requested    Current Medications (verified) Outpatient Encounter Medications as of 08/29/2020  Medication Sig   ADVAIR DISKUS 250-50 MCG/DOSE AEPB TAKE 1 PUFF BY MOUTH TWICE A DAY   albuterol (VENTOLIN HFA) 108 (90 Base) MCG/ACT inhaler Inhale 2 puffs into the lungs every 6 (six) hours as needed for wheezing or shortness of breath.   allopurinol (ZYLOPRIM) 300 MG tablet Take 1 tablet by mouth daily   aspirin 325 MG tablet Take 325 mg by mouth daily.     atorvastatin (LIPITOR) 10 MG tablet Take 1 tablet by mouth daily   benzonatate (TESSALON) 200 MG capsule Take 1 capsule (200 mg total) by mouth 3 (three) times daily as needed for cough.   cetirizine (ZYRTEC) 10 MG tablet Take 10 mg by mouth  daily.     colchicine 0.6 MG tablet Take 1 tablet (0.6 mg total) by mouth 2 (two) times daily. As needed for gout flare.   diflorasone (PSORCON) 0.05 % cream Apply 1 application topically 2 (two) times daily.   esomeprazole (NEXIUM) 20 MG capsule Take 20 mg by mouth daily at 12 noon.   hydrochlorothiazide (HYDRODIURIL) 25 MG tablet Take 1 tablet by mouth daily   losartan (COZAAR) 50 MG tablet Take 1 tablet by mouth at bedtime   mirtazapine (REMERON) 45 MG tablet Take 1 tablet by mouth every day at bedtime   montelukast (SINGULAIR) 10 MG tablet Take 1 tablet by mouth at bedtime   OVER THE COUNTER MEDICATION Nasocort nasal spray   Probiotic Product (PROBIOTIC PO) Take by mouth.   triamcinolone lotion (KENALOG) 0.1 % Apply to affected area(s) twice a day   [DISCONTINUED] chlorpheniramine-HYDROcodone (TUSSIONEX PENNKINETIC ER) 10-8 MG/5ML SUER Take 5 mLs by mouth at bedtime as needed.   [DISCONTINUED] levofloxacin (LEVAQUIN) 500 MG tablet Take 1 tablet (500 mg total) by mouth daily.   [DISCONTINUED] mometasone (NASONEX) 50 MCG/ACT nasal spray Place 2 sprays into the nose daily.   [DISCONTINUED] predniSONE (DELTASONE) 10 MG tablet 6 tablets daily for 3 days,   then reduce by 1 tablet daily until gone   [DISCONTINUED] promethazine-phenylephrine (PROMETHAZINE VC) 6.25-5 MG/5ML SYRP Take 5 mLs by mouth every 4 (four) hours as needed for congestion.   No facility-administered encounter  medications on file as of 08/29/2020.    Allergies (verified) Nifedipine   History: Past Medical History:  Diagnosis Date   Allergy    to cat, dogs, pollen, dust, cigarette smoke   Arthritis    feet   Asthma    Depression    GERD (gastroesophageal reflux disease)    Hx of adenomatous colonic polyps 05/30/2010   Hyperlipidemia    Hypertension    Menopause    Migraines    Miscarriage    X 1   Past Surgical History:  Procedure Laterality Date   CESAREAN SECTION     X 2   DENTAL SURGERY  09/2016   TUBAL  LIGATION  1980   Family History  Problem Relation Age of Onset   Myasthenia gravis Mother    Coronary artery disease Father    Valvular heart disease Father    Heart attack Father 76   Cancer Sister 67       colon   Colon cancer Sister 74   Heart attack Paternal Grandfather        25's   Social History   Socioeconomic History   Marital status: Married    Spouse name: Not on file   Number of children: Not on file   Years of education: Not on file   Highest education level: Not on file  Occupational History   Not on file  Tobacco Use   Smoking status: Former Smoker    Quit date: 01/02/1971    Years since quitting: 49.6   Smokeless tobacco: Never Used  Vaping Use   Vaping Use: Never used  Substance and Sexual Activity   Alcohol use: Yes    Alcohol/week: 0.0 standard drinks    Comment: occasional- wine gives pt headache only drinks once every 6 months   Drug use: No   Sexual activity: Not Currently  Other Topics Concern   Not on file  Social History Narrative   Not on file   Social Determinants of Health   Financial Resource Strain: Low Risk    Difficulty of Paying Living Expenses: Not hard at all  Food Insecurity: No Food Insecurity   Worried About Charity fundraiser in the Last Year: Never true   [place] in the Last Year: Never true  Transportation Needs: No Transportation Needs   Lack of Transportation (Medical): No   Lack of Transportation (Non-Medical): No  Physical Activity: Not on file  Stress: No Stress Concern Present   Feeling of Stress : Not at all  Social Connections: Unknown   Frequency of Communication with Friends and Family: Not on file   Frequency of Social Gatherings with Friends and Family: Not on file   Attends Religious Services: Not on file   Active Member of Clubs or Organizations: Not on file   Attends Archivist Meetings: Not on file   Marital Status: Married    Tobacco Counseling Counseling given: Not  Answered   Clinical Intake:  Pre-visit preparation completed: Yes    Nutrition Risk Assessment: Has the patient had any N/V/D within the last 2 months?  No  Does the patient have any non-healing wounds?  No  Has the patient had any unintentional weight loss or weight gain?  No   Diabetes: If diabetic, was a CBG obtained today?  No  Did the patient bring in their glucometer from home?  No  How often do you monitor your CBG's? Does not monitor at home.  Financial Strains and Diabetes Management: Are you having any financial strains with the device, your supplies or your medication? No .  Does the patient want to be seen by Chronic Care Management for management of their diabetes?  No  Would the patient like to be referred to a Nutritionist or for Diabetic Management?  No   Diabetic Eye Exams:  UTD  Diabetic Foot Exam: Overdue, Pt has been advised about the importance in completing this exam. Pt is scheduled for diabetic foot exam on 09/08/20.    Diabetes: Yes (Followed by pcp)  Diet controlled.   How often do you need to have someone help you when you read instructions, pamphlets, or other written materials from your doctor or pharmacy?: 1 - Never   Interpreter Needed?: No      Activities of Daily Living In your present state of health, do you have any difficulty performing the following activities: 08/29/2020  Hearing? N  Vision? N  Difficulty concentrating or making decisions? N  Walking or climbing stairs? N  Dressing or bathing? N  Doing errands, shopping? N  Preparing Food and eating ? N  Using the Toilet? N  In the past six months, have you accidently leaked urine? N  Do you have problems with loss of bowel control? N  Managing your Medications? N  Managing your Finances? N  Housekeeping or managing your Housekeeping? N  Some recent data might be hidden    Patient Care Team: [name], MD as PCP - General (Internal Medicine)  Indicate any recent  Medical Services you may have received from other than [facility] providers in the past year (date may be approximate).     Assessment:   This is a routine wellness examination for Cherril.  Hearing/Vision screen  Hearing Screening   125Hz  250Hz  500Hz  1000Hz  2000Hz  3000Hz  4000Hz  6000Hz  8000Hz   Right ear:           Left ear:           Comments: Patient is able to hear conversational tones without difficulty.  No issues reported.  Vision Screening Comments: Followed by Dr. Matilde Sprang  Wears corrective lenses  Dietary issues and exercise activities discussed: Current Exercise Habits: The patient does not participate in regular exercise at present  Regular diet Good water intake  Goals      Increase physical activity     Use stationary bike Walk  Exercise 5 days, 30 minutes       Depression Screen PHQ 2/9 Scores 08/29/2020 08/29/2020 08/18/2019 08/18/2019 12/19/2018 06/01/2018 05/28/2017  PHQ - 2 Score 0 0 0 0 0 0 0  PHQ- 9 Score - - 1 - 0 - -    Fall Risk Fall Risk  08/29/2020 10/15/2019 09/22/2019 08/20/2019 08/18/2019  Falls in the past year? 0 0 0 0 0  Number falls in past yr: 0 - - - -  Injury with Fall? 0 - - - -  Follow up Falls evaluation completed Falls evaluation completed Falls evaluation completed Falls evaluation completed Falls evaluation completed    [place]: Handrails in use when climbing stairs?Yes Home free of loose throw rugs in walkways, pet beds, electrical cords, etc? Yes  Adequate lighting in your home to reduce risk of falls? Yes   ASSISTIVE DEVICES UTILIZED TO PREVENT FALLS: Life alert? No  Use of a cane, walker or w/c? No   TIMED UP AND GO: Was the test performed? Yes . Walks 10 steps  in 10 seconds with no assistance.   Cognitive Function: Patient is alert and oriented x3.  Denies difficulty focusing, making decisions, memory loss.  Currently works in administration.  MMSE/6CIT declined. Normal by direct communication/observation.    MMSE - Mini Mental State Exam 05/28/2017  Orientation to time 5  Orientation to Place 5  Registration 3  Attention/ Calculation 5  Recall 3  Language- name 2 objects 2  Language- repeat 1  Language- follow 3 step command 3  Language- read & follow direction 1  Write a sentence 1  Copy design 1  Total score 30     6CIT Screen 08/20/2019 06/01/2018  What Year? 0 points 0 points  What month? 0 points 0 points  What time? 0 points 0 points  Count back from 20 0 points 0 points  Months in reverse 0 points 0 points  Repeat phrase 0 points 0 points  Total Score 0 0    Immunizations Immunization History  Administered Date(s) Administered   Influenza, High Dose Seasonal PF 02/24/2018, 03/15/2019   Influenza,inj,Quad PF,6+ Mos 03/03/2013   Influenza-Unspecified 01/11/2014, 02/13/2015, 02/27/2016, 03/04/2017   PFIZER(Purple Top)SARS-COV-2 Vaccination 07/02/2019, 07/23/2019   Pneumococcal Conjugate-13 03/03/2013   Pneumococcal Polysaccharide-23 08/14/2015   Tdap 03/03/2008    TDAP status: Due, Education has been provided regarding the importance of this vaccine. Advised may receive this vaccine at local pharmacy or Health Dept. Aware to provide a copy of the vaccination record if obtained from local pharmacy or Health Dept. Verbalized acceptance and understanding. Deferred.   Health Maintenance Health Maintenance  Topic Date Due   MAMMOGRAM  06/26/2019   COLONOSCOPY (Pts 45-21yrs Insurance coverage will need to be confirmed)  11/23/2019   FOOT EXAM  09/08/2020 (Originally 06/11/2019)   HEMOGLOBIN A1C  09/08/2020 (Originally 08/19/2019)   TETANUS/TDAP  08/29/2021 (Originally 03/03/2018)   INFLUENZA VACCINE  12/11/2020   OPHTHALMOLOGY EXAM  05/27/2021   DEXA SCAN  Completed   COVID-19 Vaccine  Completed   Hepatitis C Screening  Completed   PNA vac Low Risk Adult  Completed   HPV VACCINES  Aged Out   Colorectal cancer screening: Type of screening: Colonoscopy. Completed 11/23/14.  Repeat every 5 years. Overdue. Ordered per consent.   Mammogram status: Completed 06/26/19. Repeat every year. Ordered with [place] per consent.   Lung Cancer Screening: (Low Dose CT Chest recommended if Age 56-80 years, 30 pack-year currently smoking OR have quit w/in 15years.) does not qualify.   Dental Screening: Recommended annual dental exams for proper oral hygiene. Visits every 6 months.   Community Resource Referral / Chronic Care Management: CRR required this visit?  No   CCM required this visit?  No      Plan:   Keep all routine maintenance appointments.   Follow up 09/08/20 @ 2:00  I have personally reviewed and noted the following in the patient's chart:   Medical and social history Use of alcohol, tobacco or illicit drugs  Current medications and supplements Functional ability and status Nutritional status Physical activity Advanced directives List of other physicians Hospitalizations, surgeries, and ER visits in previous 12 months Vitals Screenings to include cognitive, depression, and falls Referrals and appointments  In addition, I have reviewed and discussed with patient certain preventive protocols, quality metrics, and best practice recommendations. A written personalized care plan for preventive services as well as general preventive health recommendations were provided to patient via mychart.     Varney Biles, LPN   08/20/7351  I have reviewed the above information and agree with above.   Deborra Medina, MD

## 2020-09-08 ENCOUNTER — Other Ambulatory Visit: Payer: Self-pay

## 2020-09-08 ENCOUNTER — Encounter: Payer: Self-pay | Admitting: Internal Medicine

## 2020-09-08 ENCOUNTER — Ambulatory Visit (INDEPENDENT_AMBULATORY_CARE_PROVIDER_SITE_OTHER): Payer: PPO | Admitting: Internal Medicine

## 2020-09-08 VITALS — BP 140/80 | HR 90 | Temp 96.3°F | Resp 16 | Ht 64.0 in | Wt 241.8 lb

## 2020-09-08 DIAGNOSIS — E785 Hyperlipidemia, unspecified: Secondary | ICD-10-CM

## 2020-09-08 DIAGNOSIS — F5101 Primary insomnia: Secondary | ICD-10-CM | POA: Diagnosis not present

## 2020-09-08 DIAGNOSIS — M25471 Effusion, right ankle: Secondary | ICD-10-CM

## 2020-09-08 DIAGNOSIS — M109 Gout, unspecified: Secondary | ICD-10-CM

## 2020-09-08 DIAGNOSIS — Z6841 Body Mass Index (BMI) 40.0 and over, adult: Secondary | ICD-10-CM | POA: Diagnosis not present

## 2020-09-08 DIAGNOSIS — E559 Vitamin D deficiency, unspecified: Secondary | ICD-10-CM

## 2020-09-08 DIAGNOSIS — M25472 Effusion, left ankle: Secondary | ICD-10-CM | POA: Diagnosis not present

## 2020-09-08 DIAGNOSIS — I87303 Chronic venous hypertension (idiopathic) without complications of bilateral lower extremity: Secondary | ICD-10-CM | POA: Diagnosis not present

## 2020-09-08 DIAGNOSIS — E1140 Type 2 diabetes mellitus with diabetic neuropathy, unspecified: Secondary | ICD-10-CM

## 2020-09-08 LAB — POCT GLYCOSYLATED HEMOGLOBIN (HGB A1C): Hemoglobin A1C: 5.8 % — AB (ref 4.0–5.6)

## 2020-09-08 MED ORDER — TRAZODONE HCL 150 MG PO TABS
150.0000 mg | ORAL_TABLET | Freq: Every day | ORAL | 0 refills | Status: DC
Start: 2020-09-08 — End: 2020-09-08

## 2020-09-08 MED ORDER — FUROSEMIDE 20 MG PO TABS: 20.0000 mg | ORAL_TABLET | ORAL | 0 refills | Status: DC

## 2020-09-08 MED ORDER — ZOSTER VAC RECOMB ADJUVANTED 50 MCG/0.5ML IM SUSR: 0.5000 mL | Freq: Once | INTRAMUSCULAR | 1 refills | Status: AC

## 2020-09-08 MED ORDER — TRAZODONE HCL 50 MG PO TABS: 25.0000 mg | ORAL_TABLET | Freq: Every evening | ORAL | 3 refills | Status: DC | PRN

## 2020-09-08 NOTE — Progress Notes (Signed)
Subjective:  Patient ID: Kari [name], female    DOB: [birth year]  Age: 75 y.o. MRN: [id number]  CC: The primary encounter diagnosis was Hyperlipidemia LDL goal <130. Diagnoses of Vitamin D deficiency, Type 2 diabetes mellitus with diabetic neuropathy, without long-term current use of insulin (HCC), Gouty arthritis of toe of right foot, Stasis edema, bilateral, Ankle edema, bilateral, Class 3 severe obesity without serious comorbidity with body mass index (BMI) of 40.0 to 44.9 in adult, unspecified obesity type ([place]), and Primary insomnia were also pertinent to this visit.  HPI Kari [name] presents for follow up on type 2 DM with obesity and atherosclerosis. LAST SEEN FOR DIABETES FOLLOW UP April 2021   This visit occurred during the SARS-CoV-2 public health emergency.  Safety protocols were in place, including screening questions prior to the visit, additional usage of staff PPE, and extensive cleaning of exam room while observing appropriate contact time as indicated for disinfecting solutions.   Cc: ankle swelling. Initially intermittent,  Started noticing it on a  daily  Basis a bout 6 weeks ago.  The swelling is gone by morning but recurs after 90 minutes of upright position   2) Insomnia : frequent wakings.  Takes a long time to fall asleep.  Has tried melatonin but not at dinner, as well as Benadryl,  nyquil   3)  Type 2 DM:  Historically diet controlled.  Avoids cakes,  Desserts,  Ice cream.  Eats several servings of  Fruit daiy,  But not many vegetables.   Drinks atkins shake every  Morning, sometimes yogurt  4) COPD:  She was treated by Dr Johny Shears  in March for follow up COPD /asthma   Aggravated by GERD and morbid obesity.  Was treated with prolonged prednisone taper for persistent asthma (recurrent prescriptions starting in April  followed by  20 mg daily for months followed by a slow taper which ended in  Feb 2022)   Had a rough year 2021.  Developed  moderate persistent asthma with  recurrent bronchitis. Stable today. She is an ex smoker with PFTs suggestive of COPD as well. Her obesity addsa component of restrictive disease,  With GERD  and rhinitisalso aggravating her irritable airways at times. She is allergic to her cat. S/p COVID  1/22 -continue prn albuterol, singulair 10 mg ghs -continue advair 250/50 one puffs bid -nexum 40 mg bid -continue rhinitis regimen, asal saline rinses -follow up in16weeks  Mild restriction on spirometry, due to her weight -weight lossrecommended  Base on her hx and habitus, suspect sleep apnea, however sleeping better since of the prednisone. -home sleep study to be done ( refusing) -weight loss recommended     Outpatient Medications Prior to Visit  Medication Sig Dispense Refill  . ADVAIR DISKUS 250-50 MCG/DOSE AEPB TAKE 1 PUFF BY MOUTH TWICE A DAY 60 each 3  . albuterol (VENTOLIN HFA) 108 (90 Base) MCG/ACT inhaler Inhale 2 puffs into the lungs every 6 (six) hours as needed for wheezing or shortness of breath. 18 g 1  . allopurinol (ZYLOPRIM) 300 MG tablet Take 1 tablet by mouth daily 90 tablet 0  . aspirin EC 81 MG tablet Take 81 mg by mouth daily. Swallow whole.    Marland Kitchen atorvastatin (LIPITOR) 10 MG tablet Take 1 tablet by mouth daily 90 tablet 0  . benzonatate (TESSALON) 200 MG capsule Take 1 capsule (200 mg total) by mouth 3 (three) times daily as needed for cough. 60 capsule 1  . cetirizine (ZYRTEC) 10 MG  tablet Take 10 mg by mouth daily.    . colchicine 0.6 MG tablet Take 1 tablet (0.6 mg total) by mouth 2 (two) times daily. As needed for gout flare. 60 tablet 2  . diflorasone (PSORCON) 0.05 % cream Apply 1 application topically 2 (two) times daily. 30 g 5  . esomeprazole (NEXIUM) 20 MG capsule Take 20 mg by mouth daily at 12 noon.    . hydrochlorothiazide (HYDRODIURIL) 25 MG tablet Take 1 tablet by mouth daily 90 tablet 0  . losartan (COZAAR) 50 MG tablet Take 1 tablet by mouth at bedtime 90 tablet 0  . mirtazapine  (REMERON) 45 MG tablet Take 1 tablet by mouth every day at bedtime 90 tablet 0  . montelukast (SINGULAIR) 10 MG tablet Take 1 tablet by mouth at bedtime 90 tablet 0  . OVER THE COUNTER MEDICATION Nasocort nasal spray    . Probiotic Product (PROBIOTIC PO) Take by mouth.    . triamcinolone lotion (KENALOG) 0.1 % Apply to affected area(s) twice a day 180 mL 0  . aspirin 325 MG tablet Take 325 mg by mouth daily.   (Patient not taking: Reported on 09/08/2020)     No facility-administered medications prior to visit.    Review of Systems;  Patient denies headache, fevers, malaise, unintentional weight loss, skin rash, eye pain, sinus congestion and sinus pain, sore throat, dysphagia,  hemoptysis , cough, dyspnea, wheezing, chest pain, palpitations, orthopnea, edema, abdominal pain, nausea, melena, diarrhea, constipation, flank pain, dysuria, hematuria, urinary  Frequency, nocturia, numbness, tingling, seizures,  Focal weakness, Loss of consciousness,  Tremor, insomnia, depression, anxiety, and suicidal ideation.      Objective:  BP 140/80 (BP Location: Left Arm, Patient Position: Sitting, Cuff Size: Large)   Pulse 90   Temp (!) 96.3 F (35.7 C) (Temporal)   Resp 16   Ht 5\' 4"  (1.626 m)   Wt 241 lb 12.8 oz (109.7 kg)   SpO2 94%   BMI 41.50 kg/m   BP Readings from Last 3 Encounters:  09/08/20 140/80  08/29/20 120/78  09/13/19 (!) 150/60    Wt Readings from Last 3 Encounters:  09/08/20 241 lb 12.8 oz (109.7 kg)  08/29/20 244 lb 12.8 oz (111 kg)  10/15/19 230 lb (104.3 kg)    General appearance: alert, cooperative and appears stated age Ears: normal TM's and external ear canals both ears Throat: lips, mucosa, and tongue normal; teeth and gums normal Neck: no adenopathy, no carotid bruit, supple, symmetrical, trachea midline and thyroid not enlarged, symmetric, no tenderness/mass/nodules Back: symmetric, no curvature. ROM normal. No CVA tenderness. Lungs: clear to auscultation  bilaterally Heart: regular rate and rhythm, S1, S2 normal, no murmur, click, rub or gallop Abdomen: soft, non-tender; bowel sounds normal; no masses,  no organomegaly Pulses: 2+ and symmetric Skin: Skin color, texture, turgor normal. No rashes or lesions Ext:  Mild ankle edema, non pitting.  Lymph nodes: Cervical, supraclavicular, and axillary nodes normal.  Lab Results  Component Value Date   HGBA1C 5.8 (A) 09/08/2020   HGBA1C 6.1 02/18/2019   HGBA1C 5.8 04/16/2018    Lab Results  Component Value Date   CREATININE 0.95 (H) 09/08/2020   CREATININE 1.09 (H) 09/13/2019   CREATININE 1.02 02/18/2019    Lab Results  Component Value Date   WBC 10.2 09/13/2019   HGB 12.4 09/13/2019   HCT 35.2 (L) 09/13/2019   PLT 213 09/13/2019   GLUCOSE 106 (H) 09/08/2020   CHOL 166 09/08/2020   TRIG 337 (  H) 09/08/2020   HDL 40 (L) 09/08/2020   LDLDIRECT 92.0 02/18/2019   LDLCALC 85 09/08/2020   ALT 35 (H) 09/08/2020   AST 41 (H) 09/08/2020   NA 132 (L) 09/08/2020   K 4.5 09/08/2020   CL 94 (L) 09/08/2020   CREATININE 0.95 (H) 09/08/2020   BUN 13 09/08/2020   CO2 26 09/08/2020   TSH 1.83 09/08/2020   HGBA1C 5.8 (A) 09/08/2020   MICROALBUR <0.7 06/10/2018    DG Chest 2 View  Result Date: 09/13/2019 CLINICAL DATA:  Productive cough. EXAM: CHEST - 2 VIEW COMPARISON:  None. FINDINGS: Normal cardiac size. Mild central pulmonary vascular congestion is noted with possible bilateral perihilar edema. The visualized skeletal structures are unremarkable. IMPRESSION: Mild central pulmonary vascular congestion is noted with possible bilateral perihilar edema. Electronically Signed   By: Marijo Conception M.D.   On: 09/13/2019 13:31   CT Angio Chest PE W and/or Wo Contrast  Result Date: 09/13/2019 CLINICAL DATA:  Shortness of breath EXAM: CT ANGIOGRAPHY CHEST WITH CONTRAST TECHNIQUE: Multidetector CT imaging of the chest was performed using the standard protocol during bolus administration of intravenous  contrast. Multiplanar CT image reconstructions and MIPs were obtained to evaluate the vascular anatomy. CONTRAST:  97mL OMNIPAQUE IOHEXOL 350 MG/ML SOLN COMPARISON:  Chest x-ray 09/13/2019 FINDINGS: Cardiovascular: Satisfactory opacification of the pulmonary arteries to the segmental level. No evidence of pulmonary embolism. Nonaneurysmal aorta. No dissection is seen. Mild aortic atherosclerosis. Mild coronary vascular calcification. Borderline to mild cardiomegaly. No pericardial effusion. Mediastinum/Nodes: No enlarged mediastinal, hilar, or axillary lymph nodes. Thyroid gland, trachea, and esophagus demonstrate no significant findings. Lungs/Pleura: No acute consolidation or effusion. Dependent atelectasis within both bases. 7 mm average diameter pulmonary nodule in the right middle lobe, series 6, image number 46. Upper Abdomen: Central hepatic cyst Musculoskeletal: Degenerative changes. No acute osseous abnormality. Review of the MIP images confirms the above findings. IMPRESSION: 1. Negative for acute pulmonary embolus or aortic dissection. 2. 7 mm right middle lobe pulmonary nodule. Non-contrast chest CT at 6-12 months is recommended. If the nodule is stable at time of repeat CT, then future CT at 18-24 months (from today's scan) is considered optional for low-risk patients, but is recommended for high-risk patients. This recommendation follows the consensus statement: Guidelines for Management of Incidental Pulmonary Nodules Detected on CT Images: From the Fleischner Society 2017; Radiology 2017; 284:228-243. Aortic Atherosclerosis (ICD10-I70.0). Electronically Signed   By: Donavan Foil M.D.   On: 09/13/2019 16:46    Assessment & Plan:   Problem List Items Addressed This Visit      Unprioritized   Vitamin D deficiency   Relevant Orders   VITAMIN D 25 Hydroxy (Vit-D Deficiency, Fractures) (Completed)   Insomnia    Chronic, with no improvement using over-the-counter first generation  antihistamines. Reviewed principles of good sleep hygiene. Although she is a snorer, there is no report of apneic spells by husband. Trial of melatonin and trazodone       Hyperlipidemia LDL goal <130 - Primary   Relevant Medications   aspirin EC 81 MG tablet   furosemide (LASIX) 20 MG tablet   Other Relevant Orders   Lipid panel (Completed)   TSH (Completed)   Gouty arthritis of toe of right foot   Relevant Medications   aspirin EC 81 MG tablet   Other Relevant Orders   Uric acid (Completed)   Diabetes mellitus with neuropathy ([place])    Remains  well-controlled on diet alone .  hemoglobin  A1c has been consistently at or  less than 7.0 . Patient is up-to-date on eye exams and foot exam has been done today and is normal .  She continues to report mild numbness without skin changes  today. Patient has no microalbuminuria. Patient is tolerating statin therapy for CAD risk reduction and on ACE/ARB for renal protection and hypertension   Lab Results  Component Value Date   HGBA1C 5.8 (A) 09/08/2020   Lab Results  Component Value Date   MICROALBUR <0.7 06/10/2018   MICROALBUR <0.7 07/04/2016          Relevant Medications   aspirin EC 81 MG tablet   Other Relevant Orders   Comprehensive metabolic panel (Completed)   POCT HgB A1C (Completed)   Microalbumin / creatinine urine ratio   Class 3 severe obesity without serious comorbidity with body mass index (BMI) of 40.0 to 44.9 in adult [facility])    I have addressed  BMI and recommended a low glycemic index diet utilizing smaller more frequent meals to increase metabolism.  I have also recommended that patient start exercising with a goal of 30 minutes of aerobic exercise a minimum of 5 days per week.       Ankle edema, bilateral    Venous insufficiency suspected,  But given her COPD she may have right sided heart failure.  ECHO  From June 2021 reviewed,  There is no sign of pulmonary hypertension or right sided heart failure .  vascular  surgery evaluation needed.  Every other day furosemide prescribed.        Other Visit Diagnoses    Stasis edema, bilateral       Relevant Orders   Ambulatory referral to Vascular Surgery      I have discontinued Zyion Merlo's aspirin and traZODone. I am also having her start on furosemide, Zoster Vaccine Adjuvanted, and traZODone. Additionally, I am having her maintain her cetirizine, OVER THE COUNTER MEDICATION, esomeprazole, Probiotic Product (PROBIOTIC PO), diflorasone, colchicine, triamcinolone lotion, albuterol, benzonatate, Advair Diskus, allopurinol, atorvastatin, hydrochlorothiazide, losartan, montelukast, mirtazapine, and aspirin EC.  Meds ordered this encounter  Medications  . furosemide (LASIX) 20 MG tablet    Sig: Take 1 tablet (20 mg total) by mouth every other day.    Dispense:  45 tablet    Refill:  0  . Zoster Vaccine Adjuvanted [facility]) injection    Sig: Inject 0.5 mLs into the muscle once for 1 dose.    Dispense:  1 each    Refill:  1  . DISCONTD: traZODone (DESYREL) 150 MG tablet    Sig: Take 1 tablet (150 mg total) by mouth at bedtime.    Dispense:  90 tablet    Refill:  0  . traZODone (DESYREL) 50 MG tablet    Sig: Take 0.5-1 tablets (25-50 mg total) by mouth at bedtime as needed for sleep.    Dispense:  30 tablet    Refill:  3    CORRECTED DOSE .  DISREGARD PRIOR RX FOR 150 MG    Medications Discontinued During This Encounter  Medication Reason  . aspirin 325 MG tablet Change in therapy  . traZODone (DESYREL) 150 MG tablet     Follow-up: Return in about 6 months (around 03/10/2021).   [name], MD

## 2020-09-08 NOTE — Patient Instructions (Signed)
I recommend trying melatonin for your insomnia.  It is not a sedative,  But must be taken on  a regular basis to help your internal clock.  Take every evening right after dinner: start with 3 mg dose   Max effective dose is 6 mg  I am prescribing trazodone to take 30 minutes before bedtime.  Start with 1/2 tablet  And increase as needed,  Up to 100 mg (2 tablets)    I   am referring you to [facility] to evaluate your legs for venous insufficiency   Our referral coordinator will call you when the appointment has been made.  If you do not hear from our office in a week,   Please call us back.  You may use the furosemide once daily EVERY OTHER DAY to help move the fluid  Elevating your legs and wearing compression stockings will also help if you put then on EARLY in the day

## 2020-09-09 DIAGNOSIS — G4700 Insomnia, unspecified: Secondary | ICD-10-CM | POA: Insufficient documentation

## 2020-09-09 DIAGNOSIS — M25472 Effusion, left ankle: Secondary | ICD-10-CM | POA: Insufficient documentation

## 2020-09-09 DIAGNOSIS — M25471 Effusion, right ankle: Secondary | ICD-10-CM | POA: Insufficient documentation

## 2020-09-09 LAB — COMPREHENSIVE METABOLIC PANEL
AG Ratio: 1.2 (calc) (ref 1.0–2.5)
ALT: 35 U/L — ABNORMAL HIGH (ref 6–29)
AST: 41 U/L — ABNORMAL HIGH (ref 10–35)
Albumin: 3.9 g/dL (ref 3.6–5.1)
Alkaline phosphatase (APISO): 100 U/L (ref 37–153)
BUN/Creatinine Ratio: 14 (calc) (ref 6–22)
BUN: 13 mg/dL (ref 7–25)
CO2: 26 mmol/L (ref 20–32)
Calcium: 9.7 mg/dL (ref 8.6–10.4)
Chloride: 94 mmol/L — ABNORMAL LOW (ref 98–110)
Creat: 0.95 mg/dL — ABNORMAL HIGH (ref 0.60–0.93)
Globulin: 3.2 g/dL (calc) (ref 1.9–3.7)
Glucose, Bld: 106 mg/dL — ABNORMAL HIGH (ref 65–99)
Potassium: 4.5 mmol/L (ref 3.5–5.3)
Sodium: 132 mmol/L — ABNORMAL LOW (ref 135–146)
Total Bilirubin: 0.6 mg/dL (ref 0.2–1.2)
Total Protein: 7.1 g/dL (ref 6.1–8.1)

## 2020-09-09 LAB — LIPID PANEL
Cholesterol: 166 mg/dL (ref <200)
HDL: 40 mg/dL — ABNORMAL LOW (ref >=50)
LDL Cholesterol (Calc): 85 mg/dL (calc)
Non-HDL Cholesterol (Calc): 126 mg/dL (calc) (ref <130)
Total CHOL/HDL Ratio: 4.2 (calc) (ref <5.0)
Triglycerides: 337 mg/dL — ABNORMAL HIGH (ref <150)

## 2020-09-09 LAB — TSH: TSH: 1.83 mIU/L (ref 0.40–4.50)

## 2020-09-09 LAB — VITAMIN D 25 HYDROXY (VIT D DEFICIENCY, FRACTURES): Vit D, 25-Hydroxy: 52 ng/mL (ref 30–100)

## 2020-09-09 LAB — URIC ACID: Uric Acid, Serum: 5.5 mg/dL (ref 2.5–7.0)

## 2020-09-09 NOTE — Assessment & Plan Note (Signed)
I have addressed  BMI and recommended a low glycemic index diet utilizing smaller more frequent meals to increase metabolism.  I have also recommended that patient start exercising with a goal of 30 minutes of aerobic exercise a minimum of 5 days per week.  

## 2020-09-09 NOTE — Assessment & Plan Note (Signed)
Chronic, with no improvement using over-the-counter first generation antihistamines. Reviewed principles of good sleep hygiene. Although she is a snorer, there is no report of apneic spells by husband. Trial of melatonin and trazodone

## 2020-09-09 NOTE — Assessment & Plan Note (Addendum)
Venous insufficiency suspected,  But given her COPD she may have right sided heart failure.  ECHO  From June 2021 reviewed,  There is no sign of pulmonary hypertension or right sided heart failure .  vascular surgery evaluation needed.  Every other day furosemide prescribed.

## 2020-09-09 NOTE — Assessment & Plan Note (Addendum)
Remains  well-controlled on diet alone .  hemoglobin A1c has been consistently at or  less than 7.0 . Patient is up-to-date on eye exams and foot exam has been done today and is normal .  She continues to report mild numbness without skin changes  today. Patient has no microalbuminuria. Patient is tolerating statin therapy for CAD risk reduction and on ACE/ARB for renal protection and hypertension   Lab Results  Component Value Date   HGBA1C 5.8 (A) 09/08/2020   Lab Results  Component Value Date   MICROALBUR <0.7 06/10/2018   MICROALBUR <0.7 07/04/2016

## 2020-09-19 ENCOUNTER — Other Ambulatory Visit: Payer: Self-pay | Admitting: Internal Medicine

## 2020-09-19 ENCOUNTER — Other Ambulatory Visit: Payer: Self-pay | Admitting: Adult Health

## 2020-09-19 ENCOUNTER — Other Ambulatory Visit: Payer: Self-pay | Admitting: Family Medicine

## 2020-09-19 ENCOUNTER — Encounter: Payer: Self-pay | Admitting: Internal Medicine

## 2020-09-19 ENCOUNTER — Other Ambulatory Visit: Payer: Self-pay | Admitting: Physician Assistant

## 2020-09-26 ENCOUNTER — Encounter: Payer: Self-pay | Admitting: *Deleted

## 2020-09-26 NOTE — Progress Notes (Signed)
Eye exam Abstracted.

## 2020-10-10 DIAGNOSIS — I890 Lymphedema, not elsewhere classified: Secondary | ICD-10-CM | POA: Insufficient documentation

## 2020-10-10 NOTE — Progress Notes (Signed)
MRN : [id number]  Kari [name] is a 75 y.o. (April 02, 1946) female who presents with chief complaint of No chief complaint on file. Marland Kitchen  History of Present Illness:   Patient is seen for evaluation of leg pain and leg swelling. The patient first noticed the swelling remotely. The swelling is associated with pain and discoloration. The pain and swelling worsens with prolonged dependency and improves with elevation. The pain is unrelated to activity.  The patient notes that in the morning the legs are significantly improved but they steadily worsened throughout the course of the day. The patient also notes a steady worsening of the discoloration in the ankle and shin area.   The patient denies claudication symptoms.  The patient denies symptoms consistent with rest pain.  The patient denies and extensive history of DJD and LS spine disease.  The patient has no had any past angiography, interventions or vascular surgery.  Elevation makes the leg symptoms better, dependency makes them much worse. There is no history of ulcerations. The patient denies any recent changes in medications.  The patient has not been wearing graduated compression.  The patient denies a history of DVT or PE. There is no prior history of phlebitis. There is no history of primary lymphedema.  No history of malignancies. No history of trauma or groin or pelvic surgery. There is no history of radiation treatment to the groin or pelvis  The patient denies amaurosis fugax or recent TIA symptoms. There are no recent neurological changes noted. The patient denies recent episodes of angina or shortness of breath  No outpatient medications have been marked as taking for the 10/12/20 encounter (Appointment) with Delana Meyer, Dolores Lory, MD.    Past Medical History:  Diagnosis Date  . Allergy    to cat, dogs, pollen, dust, cigarette smoke  . Arthritis    feet  . Asthma   . Depression   . GERD (gastroesophageal reflux disease)    . Hx of adenomatous colonic polyps 05/30/2010  . Hyperlipidemia   . Hypertension   . Menopause   . Migraines   . Miscarriage    X 1    Past Surgical History:  Procedure Laterality Date  . CESAREAN SECTION     X 2  . DENTAL SURGERY  09/2016  . TUBAL LIGATION  1980    Social History Social History   Tobacco Use  . Smoking status: Former Smoker    Quit date: 01/02/1971    Years since quitting: 49.8  . Smokeless tobacco: Never Used  Vaping Use  . Vaping Use: Never used  Substance Use Topics  . Alcohol use: Yes    Alcohol/week: 0.0 standard drinks    Comment: occasional- wine gives pt headache only drinks once every 6 months  . Drug use: No    Family History Family History  Problem Relation Age of Onset  . Myasthenia gravis Mother   . Coronary artery disease Father   . Valvular heart disease Father   . Heart attack Father 77  . Cancer Sister 65       colon  . Colon cancer Sister 68  . Heart attack Paternal Grandfather        54's  No family history of bleeding/clotting disorders, porphyria or autoimmune disease   Allergies  Allergen Reactions  . Nifedipine     REACTION: swollen feet     REVIEW OF SYSTEMS (Negative unless checked)  Constitutional: [] Weight loss  [] Fever  [] Chills Cardiac: [] Chest pain   []   Chest pressure   [] Palpitations   [] Shortness of breath when laying flat   [] Shortness of breath with exertion. Vascular:  [] Pain in legs with walking   [x] Pain in legs at rest  [] History of DVT   [] Phlebitis   [x] Swelling in legs   [] Varicose veins   [] Non-healing ulcers Pulmonary:   [] Uses home oxygen   [] Productive cough   [] Hemoptysis   [] Wheeze  [] COPD   [] Asthma Neurologic:  [] Dizziness   [] Seizures   [] History of stroke   [] History of TIA  [] Aphasia   [] Vissual changes   [] Weakness or numbness in arm   [] Weakness or numbness in leg Musculoskeletal:   [] Joint swelling   [] Joint pain   [] Low back pain Hematologic:  [] Easy bruising  [] Easy bleeding    [] Hypercoagulable state   [] Anemic Gastrointestinal:  [] Diarrhea   [] Vomiting  [] Gastroesophageal reflux/heartburn   [] Difficulty swallowing. Genitourinary:  [] Chronic kidney disease   [] Difficult urination  [] Frequent urination   [] Blood in urine Skin:  [] Rashes   [] Ulcers  Psychological:  [] History of anxiety   []  History of major depression.  Physical Examination  There were no vitals filed for this visit. There is no height or weight on file to calculate BMI. Gen: WD/WN, NAD Head: [place]/AT, No temporalis wasting.  Ear/Nose/Throat: Hearing grossly intact, nares w/o erythema or drainage, poor dentition Eyes: PER, EOMI, sclera nonicteric.  Neck: Supple, no masses.  No bruit or JVD.  Pulmonary:  Good air movement, clear to auscultation bilaterally, no use of accessory muscles.  Cardiac: RRR, normal S1, S2, no Murmurs. Vascular: scattered varicosities present bilaterally.  Mild venous stasis changes to the legs bilaterally.  2-3+ soft pitting edema. Vessel Right Left  Radial Palpable Palpable  PT Palpable Palpable  DP Palpable Palpable  Gastrointestinal: soft, non-distended. No guarding/no peritoneal signs.  Musculoskeletal: M/S 5/5 throughout.  No deformity or atrophy.  Neurologic: CN 2-12 intact. Pain and light touch intact in extremities.  Symmetrical.  Speech is fluent. Motor exam as listed above. Psychiatric: Judgment intact, Mood & affect appropriate for pt's clinical situation. Dermatologic: Mild venous rashes no ulcers noted.  No changes consistent with cellulitis. Lymph : No lichenification or skin changes of chronic lymphedema.  CBC Lab Results  Component Value Date   WBC 10.2 09/13/2019   HGB 12.4 09/13/2019   HCT 35.2 (L) 09/13/2019   MCV 88.4 09/13/2019   PLT 213 09/13/2019    BMET    Component Value Date/Time   NA 132 (L) 09/08/2020 1455   NA 141 04/12/2010 0000   K 4.5 09/08/2020 1455   CL 94 (L) 09/08/2020 1455   CO2 26 09/08/2020 1455   GLUCOSE 106 (H)  09/08/2020 1455   BUN 13 09/08/2020 1455   BUN 15 04/12/2010 0000   CREATININE 0.95 (H) 09/08/2020 1455   CALCIUM 9.7 09/08/2020 1455   GFRNONAA 51 (L) 09/13/2019 1420   GFRAA 59 (L) 09/13/2019 1420   CrCl cannot be calculated (Patient's most recent lab result is older than the maximum 21 days allowed.).  COAG No results found for: INR, PROTIME  Radiology No results found.   Assessment/Plan 1. Lymphedema I have had a long discussion with the patient regarding swelling and why it  causes symptoms.  Patient will begin wearing graduated compression stockings class 1 (20-30 mmHg) on a daily basis a prescription was given. The patient will  beginning wearing the stockings first thing in the morning and removing them in the evening. The patient is instructed specifically  not to sleep in the stockings.   In addition, behavioral modification will be initiated.  This will include frequent elevation, use of over the counter pain medications and exercise such as walking.  I have reviewed systemic causes for chronic edema such as liver, kidney and cardiac etiologies.  The patient denies problems with these organ systems.    Consideration for a lymph pump will also be made based upon the effectiveness of conservative therapy.  This would help to improve the edema control and prevent sequela such as ulcers and infections   Patient should undergo duplex ultrasound of the venous system to ensure that DVT or reflux is not present.  The patient will follow-up with me after the ultrasound.   - VAS [place] LOWER EXTREMITY VENOUS REFLUX; Future  2. Essential hypertension Continue antihypertensive medications as already ordered, these medications have been reviewed and there are no changes at this time.   3. Moderate persistent asthma without complication Continue pulmonary medications and aerosols as already ordered, these medications have been reviewed and there are no changes at this time.    4.  Type 2 diabetes mellitus with diabetic neuropathy, without long-term current use of insulin ([place]) Continue hypoglycemic medications as already ordered, these medications have been reviewed and there are no changes at this time.  Hgb A1C to be monitored as already arranged by primary service    Hortencia Pilar, MD  10/10/2020 10:28 AM

## 2020-10-12 ENCOUNTER — Encounter (INDEPENDENT_AMBULATORY_CARE_PROVIDER_SITE_OTHER): Payer: Self-pay | Admitting: Vascular Surgery

## 2020-10-12 ENCOUNTER — Ambulatory Visit (INDEPENDENT_AMBULATORY_CARE_PROVIDER_SITE_OTHER): Payer: PPO | Admitting: Vascular Surgery

## 2020-10-12 ENCOUNTER — Other Ambulatory Visit: Payer: Self-pay

## 2020-10-12 VITALS — BP 119/70 | HR 81 | Ht 64.0 in | Wt 236.0 lb

## 2020-10-12 DIAGNOSIS — I890 Lymphedema, not elsewhere classified: Secondary | ICD-10-CM | POA: Diagnosis not present

## 2020-10-12 DIAGNOSIS — J4540 Moderate persistent asthma, uncomplicated: Secondary | ICD-10-CM | POA: Diagnosis not present

## 2020-10-12 DIAGNOSIS — E1140 Type 2 diabetes mellitus with diabetic neuropathy, unspecified: Secondary | ICD-10-CM | POA: Diagnosis not present

## 2020-10-12 DIAGNOSIS — I10 Essential (primary) hypertension: Secondary | ICD-10-CM

## 2020-10-17 ENCOUNTER — Ambulatory Visit
Admission: RE | Admit: 2020-10-17 | Discharge: 2020-10-17 | Disposition: A | Discharge status: Home / Self Care | Payer: PPO | Source: Ambulatory Visit | Attending: Internal Medicine | Admitting: Internal Medicine

## 2020-10-17 ENCOUNTER — Other Ambulatory Visit: Payer: Self-pay

## 2020-10-17 DIAGNOSIS — Z1231 Encounter for screening mammogram for malignant neoplasm of breast: Secondary | ICD-10-CM

## 2020-10-20 ENCOUNTER — Inpatient Hospital Stay
Admission: RE | Admit: 2020-10-20 | Discharge: 2020-10-20 | Disposition: A | Discharge status: Home / Self Care | Payer: Self-pay | Source: Ambulatory Visit | Attending: *Deleted | Admitting: *Deleted

## 2020-10-20 ENCOUNTER — Other Ambulatory Visit: Payer: Self-pay | Admitting: *Deleted

## 2020-10-20 DIAGNOSIS — Z1231 Encounter for screening mammogram for malignant neoplasm of breast: Secondary | ICD-10-CM

## 2020-10-24 ENCOUNTER — Other Ambulatory Visit: Payer: Self-pay | Admitting: Internal Medicine

## 2020-10-25 ENCOUNTER — Other Ambulatory Visit: Payer: Self-pay

## 2020-10-25 MED ORDER — FUROSEMIDE 20 MG PO TABS: 20.0000 mg | ORAL_TABLET | ORAL | 3 refills | Status: DC

## 2020-10-25 MED ORDER — TRAZODONE HCL 50 MG PO TABS: 25.0000 mg | ORAL_TABLET | Freq: Every evening | ORAL | 1 refills | Status: DC | PRN

## 2020-10-26 ENCOUNTER — Other Ambulatory Visit: Payer: Self-pay

## 2020-10-26 DIAGNOSIS — M109 Gout, unspecified: Secondary | ICD-10-CM

## 2020-10-26 MED ORDER — ALLOPURINOL 300 MG PO TABS: 300.0000 mg | ORAL_TABLET | Freq: Every day | ORAL | 0 refills | Status: DC

## 2020-10-26 MED ORDER — MONTELUKAST SODIUM 10 MG PO TABS: 1.0000 | ORAL_TABLET | Freq: Every day | ORAL | 0 refills | Status: DC

## 2020-10-26 MED ORDER — HYDROCHLOROTHIAZIDE 25 MG PO TABS: 1.0000 | ORAL_TABLET | Freq: Every day | ORAL | 0 refills | Status: DC

## 2020-10-26 MED ORDER — ATORVASTATIN CALCIUM 10 MG PO TABS: 1.0000 | ORAL_TABLET | Freq: Every day | ORAL | 0 refills | Status: DC

## 2020-10-26 MED ORDER — LOSARTAN POTASSIUM 50 MG PO TABS: 1.0000 | ORAL_TABLET | Freq: Every day | ORAL | 0 refills | Status: DC

## 2020-10-31 ENCOUNTER — Other Ambulatory Visit: Payer: Self-pay

## 2020-10-31 MED ORDER — MIRTAZAPINE 45 MG PO TABS: 45.0000 mg | ORAL_TABLET | Freq: Every day | ORAL | 1 refills | Status: DC

## 2020-11-28 ENCOUNTER — Ambulatory Visit (AMBULATORY_SURGERY_CENTER): Payer: Self-pay | Admitting: *Deleted

## 2020-11-28 ENCOUNTER — Other Ambulatory Visit: Payer: Self-pay

## 2020-11-28 VITALS — Ht 64.0 in | Wt 232.0 lb

## 2020-11-28 DIAGNOSIS — Z800 Family history of malignant neoplasm of digestive organs: Secondary | ICD-10-CM

## 2020-11-28 NOTE — Progress Notes (Signed)
No egg or soy allergy known to patient   issues with past sedation with any surgeries or proceduresof PONv in the 1970's  Patient denies ever being told they had issues or difficulty with intubation  No FH of Malignant Hyperthermia No diet pills per patient No home 02 use per patient  No blood thinners per patient  Pt denies issues with constipation  No A fib or A flutter  EMMI video to pt or via [place] 19 guidelines implemented in PV today with Pt and RN  Pt is fully vaccinated  for Covid   Due to the COVID-19 pandemic we are asking patients to follow certain guidelines.  Pt aware of COVID protocols and [facility] guidelines   Recently having bouts of diarrhea- no blood noted-

## 2020-12-04 ENCOUNTER — Other Ambulatory Visit: Payer: Self-pay | Admitting: Internal Medicine

## 2020-12-12 ENCOUNTER — Other Ambulatory Visit: Payer: Self-pay

## 2020-12-12 ENCOUNTER — Ambulatory Visit (AMBULATORY_SURGERY_CENTER): Payer: PPO | Admitting: Internal Medicine

## 2020-12-12 ENCOUNTER — Encounter: Payer: Self-pay | Admitting: Internal Medicine

## 2020-12-12 VITALS — BP 139/60 | HR 70 | Temp 97.4°F | Resp 11 | Ht 64.0 in | Wt 232.0 lb

## 2020-12-12 DIAGNOSIS — I10 Essential (primary) hypertension: Secondary | ICD-10-CM | POA: Diagnosis not present

## 2020-12-12 DIAGNOSIS — Z1211 Encounter for screening for malignant neoplasm of colon: Secondary | ICD-10-CM | POA: Diagnosis not present

## 2020-12-12 DIAGNOSIS — Z800 Family history of malignant neoplasm of digestive organs: Secondary | ICD-10-CM | POA: Diagnosis not present

## 2020-12-12 DIAGNOSIS — Z86010 Personal history of colonic polyps: Secondary | ICD-10-CM

## 2020-12-12 DIAGNOSIS — J45909 Unspecified asthma, uncomplicated: Secondary | ICD-10-CM | POA: Diagnosis not present

## 2020-12-12 MED ORDER — SODIUM CHLORIDE 0.9 % IV SOLN: 500.0000 mL | Freq: Once | INTRAVENOUS | Status: DC

## 2020-12-12 NOTE — Op Note (Signed)
[facility] Patient Name: Kari [name] Procedure Date: 12/12/2020 11:28 AM MRN: [id number] Endoscopist: Gatha Mayer , MD Age: 75 Referring MD:  Date of Birth: [birth year] Gender: Female Account #: [id number] Procedure:                Colonoscopy Indications:              Surveillance: Personal history of adenomatous                            polyps on last colonoscopy > 5 years ago Medicines:                Propofol per Anesthesia, Monitored Anesthesia Care Procedure:                Pre-Anesthesia Assessment:                           - Prior to the procedure, a History and Physical                            was performed, and patient medications and                            allergies were reviewed. The patient's tolerance of                            previous anesthesia was also reviewed. The risks                            and benefits of the procedure and the sedation                            options and risks were discussed with the patient.                            All questions were answered, and informed consent                            was obtained. Prior Anticoagulants: The patient has                            taken no previous anticoagulant or antiplatelet                            agents. ASA Grade Assessment: II - A patient with                            mild systemic disease. After reviewing the risks                            and benefits, the patient was deemed in                            satisfactory condition to undergo the procedure.  After obtaining informed consent, the colonoscope                            was passed under direct vision. Throughout the                            procedure, the patient's blood pressure, pulse, and                            oxygen saturations were monitored continuously. The                            Olympus PCF-H190DL [id number]) Colonoscope was                             introduced through the anus and advanced to the the                            cecum, identified by appendiceal orifice and                            ileocecal valve. The colonoscopy was performed                            without difficulty. The patient tolerated the                            procedure well. The quality of the bowel                            preparation was good. The ileocecal valve,                            appendiceal orifice, and rectum were photographed. Scope In: 11:45:00 AM Scope Out: 11:56:26 AM Scope Withdrawal Time: 0 hours 7 minutes 48 seconds  Total Procedure Duration: 0 hours 11 minutes 26 seconds  Findings:                 The perianal and digital rectal examinations were                            normal.                           Multiple diverticula were found in the sigmoid                            colon.                           There was a medium-sized lipoma, in the descending                            colon.  The exam was otherwise without abnormality.                           No additional abnormalities were found on                            retroflexion. Complications:            No immediate complications. Estimated Blood Loss:     Estimated blood loss: none. Impression:               - Diverticulosis in the sigmoid colon.                           - Medium-sized lipoma in the descending colon.                           - The examination was otherwise normal.                           - No specimens collected. Personal hx adenomas x 2                            diminutive 2012, sister had [facility] Recommendation:           - Patient has a contact number available for                            emergencies. The signs and symptoms of potential                            delayed complications were discussed with the                            patient. Return to normal activities tomorrow.                             Written discharge instructions were provided to the                            patient.                           - No repeat colonoscopy due to current age (26                            years or older) and the absence of colonic polyps. Gatha Mayer, MD 12/12/2020 12:02:07 PM This report has been signed electronically.

## 2020-12-12 NOTE — Progress Notes (Signed)
Medical history reviewed with no changes noted. VS assessed by C.W 

## 2020-12-12 NOTE — Patient Instructions (Signed)
Resume previous diet and medications. Return to normal activities tomorrow. No repeat colonoscopy due to age, and absence of polyps.  YOU HAD AN ENDOSCOPIC PROCEDURE TODAY AT [facility]:   Refer to the procedure report that was given to you for any specific questions about what was found during the examination.  If the procedure report does not answer your questions, please call your gastroenterologist to clarify.  If you requested that your care partner not be given the details of your procedure findings, then the procedure report has been included in a sealed envelope for you to review at your convenience later.  YOU SHOULD EXPECT: Some feelings of bloating in the abdomen. Passage of more gas than usual.  Walking can help get rid of the air that was put into your GI tract during the procedure and reduce the bloating. If you had a lower endoscopy (such as a colonoscopy or flexible sigmoidoscopy) you may notice spotting of blood in your stool or on the toilet paper. If you underwent a bowel prep for your procedure, you may not have a normal bowel movement for a few days.  Please Note:  You might notice some irritation and congestion in your nose or some drainage.  This is from the oxygen used during your procedure.  There is no need for concern and it should clear up in a day or so.  SYMPTOMS TO REPORT IMMEDIATELY:  Following lower endoscopy (colonoscopy or flexible sigmoidoscopy):  Excessive amounts of blood in the stool  Significant tenderness or worsening of abdominal pains  Swelling of the abdomen that is new, acute  Fever of 100F or higher   For urgent or emergent issues, a gastroenterologist can be reached at any hour by calling [phone]. Do not use MyChart messaging for urgent concerns.    DIET:  We do recommend a small meal at first, but then you may proceed to your regular diet.  Drink plenty of fluids but you should avoid alcoholic beverages for 24  hours.  ACTIVITY:  You should plan to take it easy for the rest of today and you should NOT DRIVE or use heavy machinery until tomorrow (because of the sedation medicines used during the test).    FOLLOW UP: Our staff will call the number listed on your records 48-72 hours following your procedure to check on you and address any questions or concerns that you may have regarding the information given to you following your procedure. If we do not reach you, we will leave a message.  We will attempt to reach you two times.  During this call, we will ask if you have developed any symptoms of COVID 19. If you develop any symptoms (ie: fever, flu-like symptoms, shortness of breath, cough etc.) before then, please call [phone].  If you test positive for Covid 19 in the 2 weeks post procedure, please call and report this information to [place].    If any biopsies were taken you will be contacted by phone or by letter within the next 1-3 weeks.  Please call us at [phone] if you have not heard about the biopsies in 3 weeks.    SIGNATURES/CONFIDENTIALITY: You and/or your care partner have signed paperwork which will be entered into your electronic medical record.  These signatures attest to the fact that that the information above on your After Visit Summary has been reviewed and is understood.  Full responsibility of the confidentiality of this discharge information lies with you and/or  your care-partner.

## 2020-12-12 NOTE — Progress Notes (Signed)
Report to PACU, RN, vss, BBS= Clear.  

## 2020-12-14 ENCOUNTER — Telehealth: Payer: Self-pay | Admitting: *Deleted

## 2020-12-14 NOTE — Telephone Encounter (Signed)
  Follow up Call-  Call back number 12/12/2020  Post procedure Call Back phone  # [phone]  Permission to leave phone message Yes  Some recent data might be hidden     Patient questions:  Do you have a fever, pain , or abdominal swelling? No. Pain Score  0 *  Have you tolerated food without any problems? Yes.    Have you been able to return to your normal activities? Yes.    Do you have any questions about your discharge instructions: Diet   No. Medications  No. Follow up visit  No.  Do you have questions or concerns about your Care? No.  Actions: * If pain score is 4 or above: No action needed, pain <4.  Have you developed a fever since your procedure? no  2.   Have you had an respiratory symptoms (SOB or cough) since your procedure? no  3.   Have you tested positive for COVID 19 since your procedure no  4.   Have you had any family members/close contacts diagnosed with the COVID 19 since your procedure?  no   If yes to any of these questions please route to Joylene John, RN and Joella Prince, RN

## 2020-12-14 NOTE — Telephone Encounter (Signed)
Left message on f/u call 

## 2020-12-28 ENCOUNTER — Other Ambulatory Visit: Payer: Self-pay

## 2020-12-28 DIAGNOSIS — M109 Gout, unspecified: Secondary | ICD-10-CM

## 2020-12-28 MED ORDER — MONTELUKAST SODIUM 10 MG PO TABS: 10.0000 mg | ORAL_TABLET | Freq: Every day | ORAL | 0 refills | Status: DC

## 2020-12-28 MED ORDER — ALLOPURINOL 300 MG PO TABS: 300.0000 mg | ORAL_TABLET | Freq: Every day | ORAL | 0 refills | Status: DC

## 2020-12-28 MED ORDER — ATORVASTATIN CALCIUM 10 MG PO TABS: 10.0000 mg | ORAL_TABLET | Freq: Every day | ORAL | 0 refills | Status: DC

## 2020-12-28 MED ORDER — LOSARTAN POTASSIUM 50 MG PO TABS: 50.0000 mg | ORAL_TABLET | Freq: Every day | ORAL | 0 refills | Status: DC

## 2020-12-28 MED ORDER — HYDROCHLOROTHIAZIDE 25 MG PO TABS: 25.0000 mg | ORAL_TABLET | Freq: Every day | ORAL | 0 refills | Status: DC

## 2021-01-11 ENCOUNTER — Ambulatory Visit (INDEPENDENT_AMBULATORY_CARE_PROVIDER_SITE_OTHER): Payer: PPO

## 2021-01-11 ENCOUNTER — Other Ambulatory Visit: Payer: Self-pay

## 2021-01-11 ENCOUNTER — Encounter (INDEPENDENT_AMBULATORY_CARE_PROVIDER_SITE_OTHER): Payer: Self-pay | Admitting: Vascular Surgery

## 2021-01-11 ENCOUNTER — Ambulatory Visit (INDEPENDENT_AMBULATORY_CARE_PROVIDER_SITE_OTHER): Payer: PPO | Admitting: Vascular Surgery

## 2021-01-11 VITALS — BP 131/71 | HR 76 | Ht 64.0 in | Wt 225.0 lb

## 2021-01-11 DIAGNOSIS — I10 Essential (primary) hypertension: Secondary | ICD-10-CM

## 2021-01-11 DIAGNOSIS — I890 Lymphedema, not elsewhere classified: Secondary | ICD-10-CM

## 2021-01-11 DIAGNOSIS — J4540 Moderate persistent asthma, uncomplicated: Secondary | ICD-10-CM

## 2021-01-11 DIAGNOSIS — E785 Hyperlipidemia, unspecified: Secondary | ICD-10-CM

## 2021-01-11 NOTE — Progress Notes (Signed)
MRN : [id number]  Kari [name] is a 75 y.o. ([birth year]) female who presents with chief complaint of check legs.  History of Present Illness:   The patient returns to the office for followup evaluation regarding leg swelling.  The swelling has improved quite a bit and the pain associated with swelling has decreased substantially. There have not been any interval development of a ulcerations or wounds.  Since the previous visit the patient has been wearing graduated compression stockings and has noted little significant improvement in the lymphedema. The patient has been using compression routinely morning until night.  The patient also states elevation during the day and exercise is being done too.  Duplex ultrasound of the venous system bilateral lower extremities shows a widely patent deep system bilaterally, no significant superficial reflux noted.    Current Meds  Medication Sig   ADVAIR DISKUS 250-50 MCG/DOSE AEPB TAKE 1 PUFF BY MOUTH TWICE A DAY   albuterol (VENTOLIN HFA) 108 (90 Base) MCG/ACT inhaler Inhale 2 puffs by mouth into the lungs every 6 hours as needed for wheezing or shortness of breath   allopurinol (ZYLOPRIM) 300 MG tablet Take 1 tablet (300 mg total) by mouth daily.   aspirin EC 81 MG tablet Take 81 mg by mouth daily. Swallow whole.   atorvastatin (LIPITOR) 10 MG tablet Take 1 tablet (10 mg total) by mouth daily.   benzonatate (TESSALON) 200 MG capsule Take 1 capsule (200 mg total) by mouth 3 (three) times daily as needed for cough.   cetirizine (ZYRTEC) 10 MG tablet Take 10 mg by mouth daily.   colchicine 0.6 MG tablet Take 1 tablet (0.6 mg total) by mouth 2 (two) times daily. As needed for gout flare.   diflorasone (PSORCON) 0.05 % cream Apply 1 application topically 2 (two) times daily.   esomeprazole (NEXIUM) 20 MG capsule Take 20 mg by mouth daily at 12 noon.   furosemide (LASIX) 20 MG tablet TAKE 1 TABLET BY MOUTH EVERY OTHER DAY   hydrochlorothiazide  (HYDRODIURIL) 25 MG tablet Take 1 tablet (25 mg total) by mouth daily.   losartan (COZAAR) 50 MG tablet Take 1 tablet (50 mg total) by mouth at bedtime.   mirtazapine (REMERON) 45 MG tablet Take 1 tablet (45 mg total) by mouth at bedtime.   montelukast (SINGULAIR) 10 MG tablet Take 1 tablet (10 mg total) by mouth at bedtime.   OVER THE COUNTER MEDICATION Nasocort nasal spray   Probiotic Product (PROBIOTIC PO) Take by mouth.   traZODone (DESYREL) 50 MG tablet Take 0.5-1 tablets (25-50 mg total) by mouth at bedtime as needed for sleep.   triamcinolone lotion (KENALOG) 0.1 % Apply to affected area(s) twice a day   Current Facility-Administered Medications for the 01/11/21 encounter (Office Visit) with Delana Meyer, Dolores Lory, MD  Medication   0.9 %  sodium chloride infusion    Past Medical History:  Diagnosis Date   Allergy    to cat, dogs, pollen, dust, cigarette smoke   Arthritis    feet   Asthma    Cataract    removed both eyes   Depression    GERD (gastroesophageal reflux disease)    Hx of adenomatous colonic polyps 05/30/2010   Hyperlipidemia    Hypertension    Menopause    Migraines    Miscarriage    X 1   PONV (postoperative nausea and vomiting)     Past Surgical History:  Procedure Laterality Date   CATARACT EXTRACTION, BILATERAL  CESAREAN SECTION     X 2   COLONOSCOPY     DENTAL SURGERY  09/2016   POLYPECTOMY     TUBAL LIGATION  1980    Social History Social History   Tobacco Use   Smoking status: Former    Types: Cigarettes    Quit date: 01/02/1971    Years since quitting: 50.0   Smokeless tobacco: Never  Vaping Use   Vaping Use: Never used  Substance Use Topics   Alcohol use: Yes    Alcohol/week: 0.0 standard drinks    Comment: occasional- wine gives pt headache only drinks once every 6 months   Drug use: No    Family History Family History  Problem Relation Age of Onset   Myasthenia gravis Mother    Cervical cancer Mother    Heart disease  Father    Coronary artery disease Father    Valvular heart disease Father    Heart attack Father 12   Cancer Sister 70       colon   Colon cancer Sister 76   Heart attack Paternal Grandfather        66's   Breast cancer Neg Hx    Esophageal cancer Neg Hx    Colon polyps Neg Hx    Rectal cancer Neg Hx    Stomach cancer Neg Hx     Allergies  Allergen Reactions   Nifedipine     REACTION: swollen feet     REVIEW OF SYSTEMS (Negative unless checked)  Constitutional: '[]'$ Weight loss  '[]'$ Fever  '[]'$ Chills Cardiac: '[]'$ Chest pain   '[]'$ Chest pressure   '[]'$ Palpitations   '[]'$ Shortness of breath when laying flat   '[]'$ Shortness of breath with exertion. Vascular:  '[]'$ Pain in legs with walking   '[]'$ Pain in legs at rest  '[]'$ History of DVT   '[]'$ Phlebitis   '[x]'$ Swelling in legs   '[]'$ Varicose veins   '[]'$ Non-healing ulcers Pulmonary:   '[]'$ Uses home oxygen   '[]'$ Productive cough   '[]'$ Hemoptysis   '[]'$ Wheeze  '[]'$ COPD   '[]'$ Asthma Neurologic:  '[]'$ Dizziness   '[]'$ Seizures   '[]'$ History of stroke   '[]'$ History of TIA  '[]'$ Aphasia   '[]'$ Vissual changes   '[]'$ Weakness or numbness in arm   '[]'$ Weakness or numbness in leg Musculoskeletal:   '[]'$ Joint swelling   '[]'$ Joint pain   '[]'$ Low back pain Hematologic:  '[]'$ Easy bruising  '[]'$ Easy bleeding   '[]'$ Hypercoagulable state   '[]'$ Anemic Gastrointestinal:  '[]'$ Diarrhea   '[]'$ Vomiting  '[]'$ Gastroesophageal reflux/heartburn   '[]'$ Difficulty swallowing. Genitourinary:  '[]'$ Chronic kidney disease   '[]'$ Difficult urination  '[]'$ Frequent urination   '[]'$ Blood in urine Skin:  '[]'$ Rashes   '[]'$ Ulcers  Psychological:  '[]'$ History of anxiety   '[]'$  History of major depression.  Physical Examination  Vitals:   01/11/21 1100  BP: 131/71  Pulse: 76  Weight: 225 lb (102.1 kg)  Height: '5\' 4"'$  (1.626 m)   Body mass index is 38.62 kg/m. Gen: WD/WN, NAD Head: [place]/AT, No temporalis wasting.  Ear/Nose/Throat: Hearing grossly intact, nares w/o erythema or drainage, pinna without lesions Eyes: PER, EOMI, sclera nonicteric.  Neck: Supple, no gross  masses.  No JVD.  Pulmonary:  Good air movement, no audible wheezing, no use of accessory muscles.  Cardiac: RRR, precordium not hyperdynamic. Vascular:  scattered varicosities present bilaterally.  Mild venous stasis changes to the legs bilaterally.  1-2+ soft pitting edema  Vessel Right Left  Radial Palpable Palpable  Gastrointestinal: soft, non-distended. No guarding/no peritoneal signs.  Musculoskeletal: M/S 5/5 throughout.  No deformity.  Neurologic: CN 2-12  intact. Pain and light touch intact in extremities.  Symmetrical.  Speech is fluent. Motor exam as listed above. Psychiatric: Judgment intact, Mood & affect appropriate for pt's clinical situation. Dermatologic: Venous rashes no ulcers noted.  No changes consistent with cellulitis. Lymph : No lichenification or skin changes of chronic lymphedema.  CBC Lab Results  Component Value Date   WBC 10.2 09/13/2019   HGB 12.4 09/13/2019   HCT 35.2 (L) 09/13/2019   MCV 88.4 09/13/2019   PLT 213 09/13/2019    BMET    Component Value Date/Time   NA 132 (L) 09/08/2020 1455   NA 141 04/12/2010 0000   K 4.5 09/08/2020 1455   CL 94 (L) 09/08/2020 1455   CO2 26 09/08/2020 1455   GLUCOSE 106 (H) 09/08/2020 1455   BUN 13 09/08/2020 1455   BUN 15 04/12/2010 0000   CREATININE 0.95 (H) 09/08/2020 1455   CALCIUM 9.7 09/08/2020 1455   GFRNONAA 51 (L) 09/13/2019 1420   GFRAA 59 (L) 09/13/2019 1420   CrCl cannot be calculated (Patient's most recent lab result is older than the maximum 21 days allowed.).  COAG No results found for: INR, PROTIME  Radiology No results found.   Assessment/Plan 1. Lymphedema No surgery or intervention at this point in time.    I have reviewed my discussion with the patient regarding venous insufficiency and secondary lymph edema and why it  causes symptoms. I have discussed with the patient the chronic skin changes that accompany these problems and the long term sequela such as ulceration and infection.   Patient will continue wearing graduated compression stockings class 1 (20-30 mmHg) on a daily basis a prescription was given to the patient to keep this updated. The patient will  put the stockings on first thing in the morning and removing them in the evening. The patient is instructed specifically not to sleep in the stockings.  In addition, behavioral modification including elevation during the day will be continued.  Diet and salt restriction was also discussed.  Previous duplex ultrasound of the lower extremities shows normal deep venous system, superficial reflux was not present.   Following the review of the ultrasound the patient will follow up in 12 months to reassess the degree of swelling and the control that graduated compression is offering.   The patient can be assessed for a Lymph Pump at that time.  However, at this time the patient states they are satisfied with the control compression and elevation is yielding.    2. Hyperlipidemia LDL goal <130 Continue statin as ordered and reviewed, no changes at this time   3. Essential hypertension Continue antihypertensive medications as already ordered, these medications have been reviewed and there are no changes at this time.   4. Moderate persistent asthma without complication Continue pulmonary medications and aerosols as already ordered, these medications have been reviewed and there are no changes at this time.      Hortencia Pilar, MD  01/11/2021 11:04 AM

## 2021-03-13 MED ORDER — FLUTICASONE-SALMETEROL 250-50 MCG/ACT IN AEPB: INHALATION_SPRAY | RESPIRATORY_TRACT | 3 refills | Status: DC

## 2021-03-22 ENCOUNTER — Ambulatory Visit (INDEPENDENT_AMBULATORY_CARE_PROVIDER_SITE_OTHER): Payer: PPO | Admitting: Adult Health

## 2021-03-22 ENCOUNTER — Encounter: Payer: Self-pay | Admitting: Adult Health

## 2021-03-22 ENCOUNTER — Other Ambulatory Visit: Payer: Self-pay

## 2021-03-22 VITALS — BP 124/78 | HR 87 | Temp 96.4°F | Ht 64.02 in | Wt 212.6 lb

## 2021-03-22 DIAGNOSIS — J029 Acute pharyngitis, unspecified: Secondary | ICD-10-CM

## 2021-03-22 DIAGNOSIS — R051 Acute cough: Secondary | ICD-10-CM

## 2021-03-22 DIAGNOSIS — J0140 Acute pansinusitis, unspecified: Secondary | ICD-10-CM

## 2021-03-22 DIAGNOSIS — R1310 Dysphagia, unspecified: Secondary | ICD-10-CM | POA: Diagnosis not present

## 2021-03-22 LAB — POCT RAPID STREP A (OFFICE): Rapid Strep A Screen: NEGATIVE

## 2021-03-22 LAB — POCT INFLUENZA A/B
Influenza A, POC: NEGATIVE
Influenza B, POC: NEGATIVE

## 2021-03-22 LAB — POC COVID19 BINAXNOW: SARS Coronavirus 2 Ag: NEGATIVE

## 2021-03-22 MED ORDER — PREDNISONE 10 MG (21) PO TBPK: ORAL_TABLET | ORAL | 0 refills | Status: DC

## 2021-03-22 MED ORDER — AMOXICILLIN-POT CLAVULANATE 875-125 MG PO TABS
1.0000 | ORAL_TABLET | Freq: Two times a day (BID) | ORAL | 0 refills | Status: DC
Start: 2021-03-22 — End: 2021-04-11

## 2021-03-22 NOTE — Progress Notes (Signed)
Acute Office Visit  Subjective:    Patient ID: Kari [name], female    DOB: 1945-11-13, 75 y.o.   MRN: [id number]  Chief Complaint  Patient presents with   Cough    Cough Associated symptoms include postnasal drip and a sore throat. Pertinent negatives include no rhinorrhea, shortness of breath or wheezing.  Patient is in today for cough for two weeks. She feels she was getting better she was taking Delsym and she did not take benzonatate. She feels she is having a tickling in her throat that causes her to have cough and has sinus drainage.  She tested negative for covid, and flu today in office.  She is using her albuterol more often. Denies any shortness of breath.  Nasal drainage colored.  Flu shot 3 weeks ago.  Denies any lung symptoms allis sinus congestion, pressure , and tickle in throat with post nasal drip. Painful swallowing.  Denies any edema. Denies any chest congestion.   Patient  denies any fever, body aches,chills, rash, chest pain, shortness of breath, nausea, vomiting, or diarrhea.    Past Medical History:  Diagnosis Date   Allergy    to cat, dogs, pollen, dust, cigarette smoke   Arthritis    feet   Asthma    Cataract    removed both eyes   Depression    GERD (gastroesophageal reflux disease)    Hx of adenomatous colonic polyps 05/30/2010   Hyperlipidemia    Hypertension    Menopause    Migraines    Miscarriage    X 1   PONV (postoperative nausea and vomiting)     Past Surgical History:  Procedure Laterality Date   CATARACT EXTRACTION, BILATERAL     CESAREAN SECTION     X 2   COLONOSCOPY     DENTAL SURGERY  09/2016   POLYPECTOMY     TUBAL LIGATION  1980    Family History  Problem Relation Age of Onset   Myasthenia gravis Mother    Cervical cancer Mother    Heart disease Father    Coronary artery disease Father    Valvular heart disease Father    Heart attack Father 53   Cancer Sister 34       colon   Colon cancer Sister 34   Heart  attack Paternal Grandfather        27's   Breast cancer Neg Hx    Esophageal cancer Neg Hx    Colon polyps Neg Hx    Rectal cancer Neg Hx    Stomach cancer Neg Hx     Social History   Socioeconomic History   Marital status: Married    Spouse name: Not on file   Number of children: Not on file   Years of education: Not on file   Highest education level: Not on file  Occupational History   Not on file  Tobacco Use   Smoking status: Former    Types: Cigarettes    Quit date: 01/02/1971    Years since quitting: 50.2   Smokeless tobacco: Never  Vaping Use   Vaping Use: Never used  Substance and Sexual Activity   Alcohol use: Yes    Alcohol/week: 0.0 standard drinks    Comment: occasional- wine gives pt headache only drinks once every 6 months   Drug use: No   Sexual activity: Not Currently  Other Topics Concern   Not on file  Social History Narrative   Not on file  Social Determinants of Health   Financial Resource Strain: Low Risk    Difficulty of Paying Living Expenses: Not hard at all  Food Insecurity: No Food Insecurity   Worried About Charity fundraiser in the Last Year: Never true   [place] in the Last Year: Never true  Transportation Needs: No Transportation Needs   Lack of Transportation (Medical): No   Lack of Transportation (Non-Medical): No  Physical Activity: Not on file  Stress: No Stress Concern Present   Feeling of Stress : Not at all  Social Connections: Unknown   Frequency of Communication with Friends and Family: Not on file   Frequency of Social Gatherings with Friends and Family: Not on file   Attends Religious Services: Not on file   Active Member of Clubs or Organizations: Not on file   Attends Archivist Meetings: Not on file   Marital Status: Married  Human resources officer Violence: Not At Risk   Fear of Current or Ex-Partner: No   Emotionally Abused: No   Physically Abused: No   Sexually Abused: No    Outpatient  Medications Prior to Visit  Medication Sig Dispense Refill   albuterol (VENTOLIN HFA) 108 (90 Base) MCG/ACT inhaler Inhale 2 puffs by mouth into the lungs every 6 hours as needed for wheezing or shortness of breath 8.5 g 0   allopurinol (ZYLOPRIM) 300 MG tablet Take 1 tablet (300 mg total) by mouth daily. 90 tablet 0   aspirin EC 81 MG tablet Take 81 mg by mouth daily. Swallow whole.     atorvastatin (LIPITOR) 10 MG tablet Take 1 tablet (10 mg total) by mouth daily. 90 tablet 0   benzonatate (TESSALON) 200 MG capsule Take 1 capsule (200 mg total) by mouth 3 (three) times daily as needed for cough. 60 capsule 1   cetirizine (ZYRTEC) 10 MG tablet Take 10 mg by mouth daily.     colchicine 0.6 MG tablet Take 1 tablet (0.6 mg total) by mouth 2 (two) times daily. As needed for gout flare. 60 tablet 2   diflorasone (PSORCON) 0.05 % cream Apply 1 application topically 2 (two) times daily. 30 g 5   esomeprazole (NEXIUM) 20 MG capsule Take 20 mg by mouth daily at 12 noon.     fluticasone-salmeterol (ADVAIR DISKUS) 250-50 MCG/ACT AEPB TAKE 1 PUFF BY MOUTH TWICE A DAY 60 each 3   furosemide (LASIX) 20 MG tablet TAKE 1 TABLET BY MOUTH EVERY OTHER DAY 45 tablet 3   hydrochlorothiazide (HYDRODIURIL) 25 MG tablet Take 1 tablet (25 mg total) by mouth daily. 90 tablet 0   losartan (COZAAR) 50 MG tablet Take 1 tablet (50 mg total) by mouth at bedtime. 90 tablet 0   mirtazapine (REMERON) 45 MG tablet Take 1 tablet (45 mg total) by mouth at bedtime. 90 tablet 1   montelukast (SINGULAIR) 10 MG tablet Take 1 tablet (10 mg total) by mouth at bedtime. 90 tablet 0   OVER THE COUNTER MEDICATION Nasocort nasal spray     Probiotic Product (PROBIOTIC PO) Take by mouth.     traZODone (DESYREL) 50 MG tablet Take 0.5-1 tablets (25-50 mg total) by mouth at bedtime as needed for sleep. 90 tablet 1   triamcinolone lotion (KENALOG) 0.1 % Apply to affected area(s) twice a day 180 mL 0   FLUZONE HIGH-DOSE QUADRIVALENT 0.7 ML SUSY       Facility-Administered Medications Prior to Visit  Medication Dose Route Frequency Provider Last Rate Last  Admin   0.9 %  sodium chloride infusion  500 mL Intravenous Once Gatha Mayer, MD        Allergies  Allergen Reactions   Nifedipine     REACTION: swollen feet    Review of Systems  Constitutional: Negative.   HENT:  Positive for congestion (post nasal drip), postnasal drip and sore throat. Negative for rhinorrhea.   Respiratory:  Positive for cough. Negative for apnea, choking, chest tightness, shortness of breath, wheezing and stridor.   Cardiovascular: Negative.   Gastrointestinal: Negative.   Genitourinary: Negative.   Musculoskeletal: Negative.   Skin: Negative.   Hematological: Negative.   Psychiatric/Behavioral: Negative.        Objective:    Physical Exam Vitals and nursing note reviewed.  Constitutional:      Appearance: Normal appearance. She is obese. She is not ill-appearing.  HENT:     Head: Normocephalic and atraumatic.     Right Ear: External ear normal. No tenderness. A middle ear effusion is present. Tympanic membrane is not erythematous.     Left Ear: External ear normal. A middle ear effusion is present. Tympanic membrane is not erythematous.     Nose: Nose normal.     Mouth/Throat:     Mouth: Mucous membranes are moist. No angioedema.     Pharynx: Uvula midline. Oropharyngeal exudate (white patches bilateral pharynx.) and posterior oropharyngeal erythema present. No pharyngeal swelling or uvula swelling.  Eyes:     General: No scleral icterus.       Right eye: No discharge.        Left eye: No discharge.     Conjunctiva/sclera: Conjunctivae normal.     Pupils: Pupils are equal, round, and reactive to light.  Cardiovascular:     Rate and Rhythm: Normal rate and regular rhythm.     Pulses: Normal pulses.     Heart sounds: Normal heart sounds. No murmur heard.   No friction rub. No gallop.  Pulmonary:     Effort: Pulmonary effort is normal.  No respiratory distress.     Breath sounds: Normal breath sounds. No stridor. No wheezing, rhonchi or rales.  Chest:     Chest wall: No tenderness.  Abdominal:     General: Bowel sounds are normal. There is no distension.     Palpations: Abdomen is soft.     Tenderness: There is no abdominal tenderness. There is no guarding.  Genitourinary:    Comments: Deferred.  Musculoskeletal:        General: No tenderness. Normal range of motion.     Cervical back: Normal range of motion and neck supple.     Right lower leg: No edema.     Left lower leg: No edema.  Skin:    General: Skin is warm.     Findings: No erythema, lesion or rash.  Neurological:     Mental Status: She is alert and oriented to person, place, and time.     Motor: No weakness.     Gait: Gait normal.  Psychiatric:        Mood and Affect: Mood normal.        Behavior: Behavior normal.        Thought Content: Thought content normal.        Judgment: Judgment normal.    BP 124/78   Pulse 87   Temp (!) 96.4 F (35.8 C)   Ht 5' 4.02" (1.626 m)   Wt 212 lb 9.6 oz (96.4 kg)  SpO2 96%   BMI 36.47 kg/m  Wt Readings from Last 3 Encounters:  03/22/21 212 lb 9.6 oz (96.4 kg)  01/11/21 225 lb (102.1 kg)  12/12/20 232 lb (105.2 kg)    Health Maintenance Due  Topic Date Due   Zoster Vaccines- Shingrix (1 of 2) Never done   INFLUENZA VACCINE  12/11/2020   COVID-19 Vaccine (5 - Booster for [place] series) 01/04/2021   HEMOGLOBIN A1C  03/10/2021    There are no preventive care reminders to display for this patient.   Lab Results  Component Value Date   TSH 1.83 09/08/2020   Lab Results  Component Value Date   WBC 10.2 09/13/2019   HGB 12.4 09/13/2019   HCT 35.2 (L) 09/13/2019   MCV 88.4 09/13/2019   PLT 213 09/13/2019   Lab Results  Component Value Date   NA 132 (L) 09/08/2020   K 4.5 09/08/2020   CO2 26 09/08/2020   GLUCOSE 106 (H) 09/08/2020   BUN 13 09/08/2020   CREATININE 0.95 (H) 09/08/2020    BILITOT 0.6 09/08/2020   ALKPHOS 88 02/18/2019   AST 41 (H) 09/08/2020   ALT 35 (H) 09/08/2020   PROT 7.1 09/08/2020   ALBUMIN 4.0 02/18/2019   CALCIUM 9.7 09/08/2020   ANIONGAP 10 09/13/2019   GFR 53.35 (L) 02/18/2019   Lab Results  Component Value Date   CHOL 166 09/08/2020   Lab Results  Component Value Date   HDL 40 (L) 09/08/2020   Lab Results  Component Value Date   LDLCALC 85 09/08/2020   Lab Results  Component Value Date   TRIG 337 (H) 09/08/2020   Lab Results  Component Value Date   CHOLHDL 4.2 09/08/2020   Lab Results  Component Value Date   HGBA1C 5.8 (A) 09/08/2020       Assessment & Plan:   Problem List Items Addressed This Visit   None Visit Diagnoses     Pharyngitis, unspecified etiology    -  Primary   Relevant Medications   amoxicillin-clavulanate (AUGMENTIN) 875-125 MG tablet   predniSONE (STERAPRED UNI-PAK 21 TAB) 10 MG (21) TBPK tablet   Acute non-recurrent pansinusitis       Relevant Medications   amoxicillin-clavulanate (AUGMENTIN) 875-125 MG tablet   predniSONE (STERAPRED UNI-PAK 21 TAB) 10 MG (21) TBPK tablet   Painful swallowing       Relevant Medications   predniSONE (STERAPRED UNI-PAK 21 TAB) 10 MG (21) TBPK tablet      Will treat as below, she is advised to return if not improving at anytime and if not completely resolved after treatment.  May need ENT referral.   Covid/ flu in office negative.  Meds ordered this encounter  Medications   amoxicillin-clavulanate (AUGMENTIN) 875-125 MG tablet    Sig: Take 1 tablet by mouth 2 (two) times daily.    Dispense:  20 tablet    Refill:  0   predniSONE (STERAPRED UNI-PAK 21 TAB) 10 MG (21) TBPK tablet    Sig: PO: Take 6 tablets on day 1:Take 5 tablets day 2:Take 4 tablets day 3: Take 3 tablets day 4:Take 2 tablets day five: 5 Take 1 tablet day 6    Dispense:  21 tablet    Refill:  0   Return in about 2 weeks (around 04/05/2021), or if symptoms worsen or fail to improve. At  anytime.   Marcille Buffy, FNP

## 2021-03-22 NOTE — Patient Instructions (Signed)
Sinusitis, Adult Sinusitis is inflammation of your sinuses. Sinuses are hollow spaces in the bones around your face. Your sinuses are located: Around your eyes. In the middle of your forehead. Behind your nose. In your cheekbones. Mucus normally drains out of your sinuses. When your nasal tissues become inflamed or swollen, mucus can become trapped or blocked. This allows bacteria, viruses, and fungi to grow, which leads to infection. Most infections of the sinuses are caused by a virus. Sinusitis can develop quickly. It can last for up to 4 weeks (acute) or for more than 12 weeks (chronic). Sinusitis often develops after a cold. What are the causes? This condition is caused by anything that creates swelling in the sinuses or stops mucus from draining. This includes: Allergies. Asthma. Infection from bacteria or viruses. Deformities or blockages in your nose or sinuses. Abnormal growths in the nose (nasal polyps). Pollutants, such as chemicals or irritants in the air. Infection from fungi (rare). What increases the risk? You are more likely to develop this condition if you: Have a weak body defense system (immune system). Do a lot of swimming or diving. Overuse nasal sprays. Smoke. What are the signs or symptoms? The main symptoms of this condition are pain and a feeling of pressure around the affected sinuses. Other symptoms include: Stuffy nose or congestion. Thick drainage from your nose. Swelling and warmth over the affected sinuses. Headache. Upper toothache. A cough that may get worse at night. Extra mucus that collects in the throat or the back of the nose (postnasal drip). Decreased sense of smell and taste. Fatigue. A fever. Sore throat. Bad breath. How is this diagnosed? This condition is diagnosed based on: Your symptoms. Your medical history. A physical exam. Tests to find out if your condition is acute or chronic. This may include: Checking your nose for nasal  polyps. Viewing your sinuses using a device that has a light (endoscope). Testing for allergies or bacteria. Imaging tests, such as an MRI or CT scan. In rare cases, a bone biopsy may be done to rule out more serious types of fungal sinus disease. How is this treated? Treatment for sinusitis depends on the cause and whether your condition is chronic or acute. If caused by a virus, your symptoms should go away on their own within 10 days. You may be given medicines to relieve symptoms. They include: Medicines that shrink swollen nasal passages (topical intranasal decongestants). Medicines that treat allergies (antihistamines). A spray that eases inflammation of the nostrils (topical intranasal corticosteroids). Rinses that help get rid of thick mucus in your nose (nasal saline washes). If caused by bacteria, your health care provider may recommend waiting to see if your symptoms improve. Most bacterial infections will get better without antibiotic medicine. You may be given antibiotics if you have: A severe infection. A weak immune system. If caused by narrow nasal passages or nasal polyps, you may need to have surgery. Follow these instructions at home: Medicines Take, use, or apply over-the-counter and prescription medicines only as told by your health care provider. These may include nasal sprays. If you were prescribed an antibiotic medicine, take it as told by your health care provider. Do not stop taking the antibiotic even if you start to feel better. Hydrate and humidify  Drink enough fluid to keep your urine pale yellow. Staying hydrated will help to thin your mucus. Use a cool mist humidifier to keep the humidity level in your home above 50%. Inhale steam for 10-15 minutes, 3-4 times  a day, or as told by your health care provider. You can do this in the bathroom while a hot shower is running. Limit your exposure to cool or dry air. Rest Rest as much as possible. Sleep with your  head raised (elevated). Make sure you get enough sleep each night. General instructions  Apply a warm, moist washcloth to your face 3-4 times a day or as told by your health care provider. This will help with discomfort. Wash your hands often with soap and water to reduce your exposure to germs. If soap and water are not available, use hand sanitizer. Do not smoke. Avoid being around people who are smoking (secondhand smoke). Keep all follow-up visits as told by your health care provider. This is important. Contact a health care provider if: You have a fever. Your symptoms get worse. Your symptoms do not improve within 10 days. Get help right away if: You have a severe headache. You have persistent vomiting. You have severe pain or swelling around your face or eyes. You have vision problems. You develop confusion. Your neck is stiff. You have trouble breathing. Summary Sinusitis is soreness and inflammation of your sinuses. Sinuses are hollow spaces in the bones around your face. This condition is caused by nasal tissues that become inflamed or swollen. The swelling traps or blocks the flow of mucus. This allows bacteria, viruses, and fungi to grow, which leads to infection. If you were prescribed an antibiotic medicine, take it as told by your health care provider. Do not stop taking the antibiotic even if you start to feel better. Keep all follow-up visits as told by your health care provider. This is important. This information is not intended to replace advice given to you by your health care provider. Make sure you discuss any questions you have with your health care provider. Document Revised: 09/29/2017 Document Reviewed: 09/29/2017 Elsevier Patient Education  [place]. Prednisolone Tablets What is this medication? PREDNISOLONE (pred NISS oh lone) treats many conditions such as asthma, allergic reactions, arthritis, inflammatory bowel diseases, adrenal, and blood or  bone marrow disorders. It works by decreasing inflammation, slowing down an overactive immune system, or replacing cortisol normally made in the body. Cortisol is a hormone that plays an important role in how the body responds to stress, illness, and injury. It belongs to a group of medications called steroids. This medicine may be used for other purposes; ask your health care provider or pharmacist if you have questions. COMMON BRAND NAME(S): Millipred, Millipred DP, Millipred DP 12-Day, Millipred DP 6 Day, Prednoral What should I tell my care team before I take this medication? They need to know if you have any of these conditions: Cushing's syndrome Diabetes Glaucoma Heart problems or disease High blood pressure Infection such as herpes, measles, tuberculosis, or chickenpox Kidney disease Liver disease Mental problems Myasthenia gravis Osteoporosis Seizures Stomach ulcer or intestine disease including colitis and diverticulitis Thyroid problem An unusual or allergic reaction to lactose, prednisolone, other medications, foods, dyes, or preservatives Pregnant or trying to get pregnant Breast-feeding How should I use this medication? Take this medication by mouth with a glass of water. Follow the directions on the prescription label. Take it with food or milk to avoid stomach upset. If you are taking this medication once a day, take it in the morning. Do not take more medication than you are told to take. Do not suddenly stop taking your medication because you may develop a severe reaction. Your care team will  tell you how much medication to take. If your care team wants you to stop the medication, the dose may be slowly lowered over time to avoid any side effects. Talk to your care team about the use of this medication in children. Special care may be needed. Overdosage: If you think you have taken too much of this medicine contact a poison control center or emergency room at once. NOTE:  This medicine is only for you. Do not share this medicine with others. What if I miss a dose? If you miss a dose, take it as soon as you can. If it is almost time for your next dose, take only that dose. Do not take double or extra doses. What may interact with this medication? Do not take this medication with any of the following: Metyrapone Mifepristone This medication may also interact with the following: Aminoglutethimide Amphotericin B Aspirin and aspirin-like medications Barbiturates Certain medications for diabetes, like glipizide or glyburide Cholestyramine Cholinesterase inhibitors Cyclosporine Digoxin Diuretics Ephedrine Female hormones, like estrogens and birth control pills Isoniazid Ketoconazole NSAIDS, medications for pain and inflammation, like ibuprofen or naproxen Phenytoin Rifampin Toxoids Vaccines Warfarin This list may not describe all possible interactions. Give your health care provider a list of all the medicines, herbs, non-prescription drugs, or dietary supplements you use. Also tell them if you smoke, drink alcohol, or use illegal drugs. Some items may interact with your medicine. What should I watch for while using this medication? Visit your care team for regular checks on your progress. If you are taking this medication over a prolonged period, carry an identification card with your name and address, the type and dose of your medication, and your care team's name and address. This medication may increase your risk of getting an infection. Tell your care team if you are around anyone with measles or chickenpox, or if you develop sores or blisters that do not heal properly. If you are going to have surgery, tell your care team that you have taken this medication within the last twelve months. Ask your care team about your diet. You may need to lower the amount of salt you eat. This medication may increase blood sugar. Ask your care team if changes in diet  or medications are needed if you have diabetes. What side effects may I notice from receiving this medication? Side effects that you should report to your care team as soon as possible: Allergic reactions--skin rash, itching, hives, swelling of the face, lips, tongue, or throat Cushing syndrome--increased fat around the midsection, upper back, neck, or face, pink or purple stretch marks on the skin, thinning, fragile skin that easily bruises, unexpected hair growth High blood sugar (hyperglycemia)--increased thirst or amount of urine, unusual weakness or fatigue, blurry vision Increase in blood pressure Infection--fever, chills, cough, sore throat, wounds that don't heal, pain or trouble when passing urine, general feeling of discomfort or being unwell Low adrenal gland function--nausea, vomiting, loss of appetite, unusual weakness or fatigue, dizziness Mood and behavior changes--anxiety, nervousness, confusion, hallucinations, irritability, hostility, thoughts of suicide or self-harm, worsening mood, feelings of depression Stomach bleeding--bloody or black, tar-like stools, vomiting blood or brown material that looks like coffee grounds Swelling of the ankles, hands, or feet Side effects that usually do not require medical attention (report to your care team if they continue or are bothersome): Acne General discomfort and fatigue Headache Increase in appetite Nausea Trouble sleeping Weight gain This list may not describe all possible side effects. Call your doctor  for medical advice about side effects. You may report side effects to FDA at 1-800-FDA-1088. Where should I keep my medication? Keep out of the reach of children. Store at room temperature between 15 and 30 degrees C (59 and 86 degrees F). Keep container tightly closed. Throw away any unused medication after the expiration date. NOTE: This sheet is a summary. It may not cover all possible information. If you have questions about  this medicine, talk to your doctor, pharmacist, or health care provider.  2022 Elsevier/Gold Standard (2020-07-28 00:00:00) Amoxicillin; Clavulanic Acid Tablets What is this medication? AMOXICILLIN; CLAVULANIC ACID (a mox i SIL in; KLAV yoo lan ic AS id) treats infections caused by bacteria. It belongs to a group of medications called penicillin antibiotics. It will not treat colds, the flu, or infections caused by viruses. This medicine may be used for other purposes; ask your health care provider or pharmacist if you have questions. COMMON BRAND NAME(S): Augmentin What should I tell my care team before I take this medication? They need to know if you have any of these conditions: Kidney disease Liver disease Mononucleosis Stomach or intestine problems such as colitis An unusual or allergic reaction to amoxicillin, other penicillin or cephalosporin antibiotics, clavulanic acid, other medications, foods, dyes, or preservatives Pregnant or trying to get pregnant Breast-feeding How should I use this medication? Take this medication by mouth. Take it as directed on the prescription label at the same time every day. Take it with food at the start of a meal or snack. Take all of this medication unless your care team tells you to stop it early. Keep taking it even if you think you are better. Talk to your care team about the use of this medication in children. While it may be prescribed for selected conditions, precautions do apply. Overdosage: If you think you have taken too much of this medicine contact a poison control center or emergency room at once. NOTE: This medicine is only for you. Do not share this medicine with others. What if I miss a dose? If you miss a dose, take it as soon as you can. If it is almost time for your next dose, take only that dose. Do not take double or extra doses. What may interact with this medication? Allopurinol Anticoagulants Birth control  pills Methotrexate Probenecid This list may not describe all possible interactions. Give your health care provider a list of all the medicines, herbs, non-prescription drugs, or dietary supplements you use. Also tell them if you smoke, drink alcohol, or use illegal drugs. Some items may interact with your medicine. What should I watch for while using this medication? Tell your care team if your symptoms do not start to get better or if they get worse. This medication may cause serious skin reactions. They can happen weeks to months after starting the medication. Contact your care team right away if you notice fevers or flu-like symptoms with a rash. The rash may be red or purple and then turn into blisters or peeling of the skin. Or, you might notice a red rash with swelling of the face, lips or lymph nodes in your neck or under your arms. Do not treat diarrhea with over the counter products. Contact your care team if you have diarrhea that lasts more than 2 days or if it is severe and watery. If you have diabetes, you may get a false-positive result for sugar in your urine. Check with your care team. Birth control may not  work properly while you are taking this medication. Talk to your care team about using an extra method of birth control. What side effects may I notice from receiving this medication? Side effects that you should report to your care team as soon as possible: Allergic reactions--skin rash, itching, hives, swelling of the face, lips, tongue, or throat Liver injury--right upper belly pain, loss of appetite, nausea, light-colored stool, dark yellow or brown urine, yellowing skin or eyes, unusual weakness or fatigue Redness, blistering, peeling, or loosening of the skin, including inside the mouth Severe diarrhea, fever Unusual vaginal discharge, itching, or odor Side effects that usually do not require medical attention (report to your care team if they continue or are  bothersome): Diarrhea Nausea Vomiting This list may not describe all possible side effects. Call your doctor for medical advice about side effects. You may report side effects to FDA at 1-800-FDA-1088. Where should I keep my medication? Keep out of the reach of children and pets. Store at room temperature between 20 and 25 degrees C (68 and 77 degrees F). Throw away any unused medication after the expiration date. NOTE: This sheet is a summary. It may not cover all possible information. If you have questions about this medicine, talk to your doctor, pharmacist, or health care provider.  2022 Elsevier/Gold Standard (2020-04-23 00:00:00)

## 2021-04-09 ENCOUNTER — Emergency Department: Payer: PPO

## 2021-04-09 ENCOUNTER — Emergency Department
Admission: EM | Admit: 2021-04-09 | Discharge: 2021-04-09 | Disposition: A | Discharge status: Home / Self Care | Payer: PPO | Attending: Emergency Medicine | Admitting: Emergency Medicine

## 2021-04-09 DIAGNOSIS — R002 Palpitations: Secondary | ICD-10-CM | POA: Diagnosis not present

## 2021-04-09 DIAGNOSIS — E1140 Type 2 diabetes mellitus with diabetic neuropathy, unspecified: Secondary | ICD-10-CM | POA: Insufficient documentation

## 2021-04-09 DIAGNOSIS — E876 Hypokalemia: Secondary | ICD-10-CM | POA: Diagnosis not present

## 2021-04-09 DIAGNOSIS — I4891 Unspecified atrial fibrillation: Secondary | ICD-10-CM | POA: Diagnosis not present

## 2021-04-09 DIAGNOSIS — Z7951 Long term (current) use of inhaled steroids: Secondary | ICD-10-CM | POA: Insufficient documentation

## 2021-04-09 DIAGNOSIS — J45909 Unspecified asthma, uncomplicated: Secondary | ICD-10-CM | POA: Insufficient documentation

## 2021-04-09 DIAGNOSIS — Z79899 Other long term (current) drug therapy: Secondary | ICD-10-CM | POA: Insufficient documentation

## 2021-04-09 DIAGNOSIS — Z7982 Long term (current) use of aspirin: Secondary | ICD-10-CM | POA: Insufficient documentation

## 2021-04-09 DIAGNOSIS — Z87891 Personal history of nicotine dependence: Secondary | ICD-10-CM | POA: Diagnosis not present

## 2021-04-09 DIAGNOSIS — I10 Essential (primary) hypertension: Secondary | ICD-10-CM | POA: Insufficient documentation

## 2021-04-09 DIAGNOSIS — R911 Solitary pulmonary nodule: Secondary | ICD-10-CM | POA: Diagnosis not present

## 2021-04-09 DIAGNOSIS — R008 Other abnormalities of heart beat: Secondary | ICD-10-CM | POA: Diagnosis present

## 2021-04-09 LAB — BASIC METABOLIC PANEL
Anion gap: 9 (ref 5–15)
BUN: 9 mg/dL (ref 8–23)
CO2: 22 mmol/L (ref 22–32)
Calcium: 8.6 mg/dL — ABNORMAL LOW (ref 8.9–10.3)
Chloride: 97 mmol/L — ABNORMAL LOW (ref 98–111)
Creatinine, Ser: 0.74 mg/dL (ref 0.44–1.00)
GFR, Estimated: >60 mL/min (ref >60)
Glucose, Bld: 132 mg/dL — ABNORMAL HIGH (ref 70–99)
Potassium: 3.3 mmol/L — ABNORMAL LOW (ref 3.5–5.1)
Sodium: 128 mmol/L — ABNORMAL LOW (ref 135–145)

## 2021-04-09 LAB — CBC
HCT: 34.3 % — ABNORMAL LOW (ref 36.0–46.0)
Hemoglobin: 11.9 g/dL — ABNORMAL LOW (ref 12.0–15.0)
MCH: 31.6 pg (ref 26.0–34.0)
MCHC: 34.7 g/dL (ref 30.0–36.0)
MCV: 91 fL (ref 80.0–100.0)
Platelets: 264 10*3/uL (ref 150–400)
RBC: 3.77 MIL/uL — ABNORMAL LOW (ref 3.87–5.11)
RDW: 13.9 % (ref 11.5–15.5)
WBC: 8.2 10*3/uL (ref 4.0–10.5)
nRBC: 0 % (ref 0.0–0.2)

## 2021-04-09 LAB — TSH: TSH: 1.9 u[IU]/mL (ref 0.350–4.500)

## 2021-04-09 LAB — MAGNESIUM: Magnesium: 1.5 mg/dL — ABNORMAL LOW (ref 1.7–2.4)

## 2021-04-09 MED ORDER — POTASSIUM CHLORIDE CRYS ER 20 MEQ PO TBCR
40.0000 meq | EXTENDED_RELEASE_TABLET | Freq: Once | ORAL | Status: AC
  Administered 2021-04-09: 11:00:00 40 meq via ORAL
  Filled 2021-04-09: qty 2

## 2021-04-09 MED ORDER — DILTIAZEM HCL 25 MG/5ML IV SOLN
20.0000 mg | Freq: Once | INTRAVENOUS | Status: AC
  Administered 2021-04-09: 09:00:00 20 mg via INTRAVENOUS
  Filled 2021-04-09: qty 5

## 2021-04-09 MED ORDER — DILTIAZEM HCL 25 MG/5ML IV SOLN
25.0000 mg | Freq: Once | INTRAVENOUS | Status: AC
  Administered 2021-04-09: 10:00:00 25 mg via INTRAVENOUS
  Filled 2021-04-09: qty 5

## 2021-04-09 MED ORDER — SODIUM CHLORIDE 0.9 % IV BOLUS
1000.0000 mL | Freq: Once | INTRAVENOUS | Status: AC
  Administered 2021-04-09: 09:00:00 1000 mL via INTRAVENOUS

## 2021-04-09 MED ORDER — DILTIAZEM HCL-DEXTROSE 125-5 MG/125ML-% IV SOLN (PREMIX): 5.0000 mg/h | INTRAVENOUS | Status: DC

## 2021-04-09 MED ORDER — METOPROLOL TARTRATE 25 MG PO TABS: 25.0000 mg | ORAL_TABLET | Freq: Two times a day (BID) | ORAL | 0 refills | Status: DC

## 2021-04-09 MED ORDER — MAGNESIUM SULFATE 2 GM/50ML IV SOLN
2.0000 g | Freq: Once | INTRAVENOUS | Status: AC
  Administered 2021-04-09: 11:00:00 2 g via INTRAVENOUS
  Filled 2021-04-09: qty 50

## 2021-04-09 NOTE — ED Provider Notes (Signed)
[facility] Emergency Department Provider Note  ____________________________________________  Time seen: Approximately 10:18 AM  I have reviewed the triage vital signs and the nursing notes.   HISTORY  Chief Complaint Irregular Heart Beat    HPI Kari [name] is a 75 y.o. female with a history of asthma, hypertension, hyperlipidemia, migraines who comes ED complaining of a feeling of her heart racing that started this morning at about 7:40 AM.  She took aspirin shortly thereafter.  It is been constant, denies chest pain or shortness of breath, no other acute symptoms.  No aggravating or alleviating factors.  Denies any recent exertional symptoms.  She reports that in the past she has had feelings like this but they would only last for a few seconds or up to a minute and then go away on their own.  This is the first time it has been persistent.  She does not have a cardiologist.    Past Medical History:  Diagnosis Date  . Allergy    to cat, dogs, pollen, dust, cigarette smoke  . Arthritis    feet  . Asthma   . Cataract    removed both eyes  . Depression   . GERD (gastroesophageal reflux disease)   . Hx of adenomatous colonic polyps 05/30/2010  . Hyperlipidemia   . Hypertension   . Menopause   . Migraines   . Miscarriage    X 1  . PONV (postoperative nausea and vomiting)      Patient Active Problem List   Diagnosis Date Noted  . Lymphedema 10/10/2020  . Ankle edema, bilateral 09/09/2020  . Insomnia 09/09/2020  . Pulmonary nodule 09/22/2019  . Bronchitis with asthma, acute 08/18/2019  . Osteopenia after menopause 12/12/2017  . Diabetes mellitus with neuropathy ([place]) 07/06/2016  . Family history of colon cancer - sister 15's 11/23/2014  . Gouty arthritis of toe of right foot 09/15/2014  . Major depressive disorder, single episode, in remission ([place]) 08/11/2013  . Class 3 severe obesity without serious comorbidity with body mass index (BMI)  of 40.0 to 44.9 in adult ([place]) 03/04/2013  . Encounter for Medicare annual wellness exam 08/06/2012  . Palpitations 07/25/2011  . Menopause   . Asthma   . Screening for malignant neoplasm of breast 01/02/2011  . Hearing loss 01/02/2011  . Hx of adenomatous colonic polyps 05/30/2010  . Vitamin D deficiency 08/19/2007  . POSTMENOPAUSAL STATUS 08/19/2007  . Hyperlipidemia LDL goal <130 08/18/2007  . Essential hypertension 08/18/2007  . ALLERGIC RHINITIS 08/18/2007  . Asthma 08/18/2007  . GERD 08/18/2007  . IBS 08/18/2007  . PSORIASIS 08/18/2007  . OSTEOARTHRITIS 08/18/2007  . MIGRAINES, HX OF 08/18/2007     Past Surgical History:  Procedure Laterality Date  . CATARACT EXTRACTION, BILATERAL    . CESAREAN SECTION     X 2  . COLONOSCOPY    . DENTAL SURGERY  09/2016  . POLYPECTOMY    . TUBAL LIGATION  1980     Prior to Admission medications   Medication Sig Start Date End Date Taking? Authorizing Provider  metoprolol tartrate (LOPRESSOR) 25 MG tablet Take 1 tablet (25 mg total) by mouth 2 (two) times daily. 04/09/21 05/09/21 Yes Carrie Mew, MD  albuterol (VENTOLIN HFA) 108 (90 Base) MCG/ACT inhaler Inhale 2 puffs by mouth into the lungs every 6 hours as needed for wheezing or shortness of breath 10/24/20   [name], MD  allopurinol (ZYLOPRIM) 300 MG tablet Take 1 tablet (300 mg total) by  mouth daily. 12/28/20   [name], MD  amoxicillin-clavulanate (AUGMENTIN) 875-125 MG tablet Take 1 tablet by mouth 2 (two) times daily. 03/22/21   Flinchum, Kelby Aline, FNP  aspirin EC 81 MG tablet Take 81 mg by mouth daily. Swallow whole.    [provider]  atorvastatin (LIPITOR) 10 MG tablet Take 1 tablet (10 mg total) by mouth daily. 12/28/20   [name], MD  benzonatate (TESSALON) 200 MG capsule Take 1 capsule (200 mg total) by mouth 3 (three) times daily as needed for cough. 09/22/19   [name], MD  cetirizine (ZYRTEC) 10 MG tablet Take 10 mg by mouth  daily.    [provider]  colchicine 0.6 MG tablet Take 1 tablet (0.6 mg total) by mouth 2 (two) times daily. As needed for gout flare. 12/11/18   [name], MD  diflorasone (PSORCON) 0.05 % cream Apply 1 application topically 2 (two) times daily. 05/28/17   [name], MD  esomeprazole (NEXIUM) 20 MG capsule Take 20 mg by mouth daily at 12 noon.    [provider]  fluticasone-salmeterol (ADVAIR DISKUS) 250-50 MCG/ACT AEPB TAKE 1 PUFF BY MOUTH TWICE A DAY 03/13/21   [name], MD  FLUZONE HIGH-DOSE QUADRIVALENT 0.7 ML SUSY  02/25/21   [provider]  furosemide (LASIX) 20 MG tablet TAKE 1 TABLET BY MOUTH EVERY OTHER DAY 12/04/20   [name], MD  hydrochlorothiazide (HYDRODIURIL) 25 MG tablet Take 1 tablet (25 mg total) by mouth daily. 12/28/20   [name], MD  losartan (COZAAR) 50 MG tablet Take 1 tablet (50 mg total) by mouth at bedtime. 12/28/20   [name], MD  mirtazapine (REMERON) 45 MG tablet Take 1 tablet (45 mg total) by mouth at bedtime. 10/31/20   [name], MD  montelukast (SINGULAIR) 10 MG tablet Take 1 tablet (10 mg total) by mouth at bedtime. 12/28/20   [name], MD  OVER THE COUNTER MEDICATION Nasocort nasal spray    [provider]  predniSONE (STERAPRED UNI-PAK 21 TAB) 10 MG (21) TBPK tablet PO: Take 6 tablets on day 1:Take 5 tablets day 2:Take 4 tablets day 3: Take 3 tablets day 4:Take 2 tablets day five: 5 Take 1 tablet day 6 03/22/21   Flinchum, Kelby Aline, FNP  Probiotic Product (PROBIOTIC PO) Take by mouth.    [provider]  traZODone (DESYREL) 50 MG tablet Take 0.5-1 tablets (25-50 mg total) by mouth at bedtime as needed for sleep. 10/25/20   [name], MD  triamcinolone lotion (KENALOG) 0.1 % Apply to affected area(s) twice a day 07/21/19   [name], MD  mometasone (NASONEX) 50 MCG/ACT nasal spray Place 2 sprays into the nose daily. 01/02/11 07/24/11  [name], MD      Allergies Nifedipine   Family History  Problem Relation Age of Onset  . Myasthenia gravis Mother   . Cervical cancer Mother   . Heart disease Father   . Coronary artery disease Father   . Valvular heart disease Father   . Heart attack Father 68  . Cancer Sister 46       colon  . Colon cancer Sister 31  . Heart attack Paternal Grandfather        55's  . Breast cancer Neg Hx   . Esophageal cancer Neg Hx   . Colon polyps Neg Hx   . Rectal cancer Neg Hx   .  Stomach cancer Neg Hx     Social History Social History   Tobacco Use  . Smoking status: Former    Types: Cigarettes    Quit date: 01/02/1971    Years since quitting: 50.3  . Smokeless tobacco: Never  Vaping Use  . Vaping Use: Never used  Substance Use Topics  . Alcohol use: Yes    Alcohol/week: 0.0 standard drinks    Comment: occasional- wine gives pt headache only drinks once every 6 months  . Drug use: No    Review of Systems  Constitutional:   No fever or chills.  ENT:   No sore throat. No rhinorrhea. Cardiovascular:   No chest pain or syncope.  Positive palpitations Respiratory:   No dyspnea or cough. Gastrointestinal:   Negative for abdominal pain, vomiting and diarrhea.  Musculoskeletal:   Negative for focal pain or swelling All other systems reviewed and are negative except as documented above in ROS and HPI.  ____________________________________________   PHYSICAL EXAM:  VITAL SIGNS: ED Triage Vitals  Enc Vitals Group     BP 04/09/21 0845 (!) 163/112     Pulse Rate 04/09/21 0845 (!) 148     Resp 04/09/21 0845 20     Temp 04/09/21 0845 98.1 F (36.7 C)     Temp Source 04/09/21 0845 Oral     SpO2 04/09/21 0845 97 %     Weight 04/09/21 0843 210 lb (95.3 kg)     Height 04/09/21 0843 5\' 4"  (1.626 m)     Head Circumference --      Peak Flow --      Pain Score 04/09/21 0843 0     Pain Loc --      Pain Edu? --      Excl. in [place]? --     Vital signs reviewed, nursing assessments  reviewed.   Constitutional:   Alert and oriented. Non-toxic appearance. Eyes:   Conjunctivae are normal. EOMI. PERRL. ENT      Head:   Normocephalic and atraumatic.      Nose:   Wearing a mask.      Mouth/Throat:   Wearing a mask.      Neck:   No meningismus. Full ROM. Hematological/Lymphatic/Immunilogical:   No cervical lymphadenopathy. Cardiovascular:   Tachycardia heart rate 150. Symmetric bilateral radial and DP pulses.  No murmurs. Cap refill less than 2 seconds. Respiratory:   Normal respiratory effort without tachypnea/retractions. Breath sounds are clear and equal bilaterally. No wheezes/rales/rhonchi. Gastrointestinal:   Soft and nontender. Non distended. There is no CVA tenderness.  No rebound, rigidity, or guarding. Genitourinary:   deferred Musculoskeletal:   Normal range of motion in all extremities. No joint effusions.  No lower extremity tenderness.  No edema. Neurologic:   Normal speech and language.  Motor grossly intact. No acute focal neurologic deficits are appreciated.  Skin:    Skin is warm, dry and intact. No rash noted.  No petechiae, purpura, or bullae.  ____________________________________________    LABS (pertinent positives/negatives) (all labs ordered are listed, but only abnormal results are displayed) Labs Reviewed  BASIC METABOLIC PANEL - Abnormal; Notable for the following components:      Result Value   Sodium 128 (*)    Potassium 3.3 (*)    Chloride 97 (*)    Glucose, Bld 132 (*)    Calcium 8.6 (*)    All other components within normal limits  CBC - Abnormal; Notable for the following components:   RBC  3.77 (*)    Hemoglobin 11.9 (*)    HCT 34.3 (*)    All other components within normal limits  MAGNESIUM - Abnormal; Notable for the following components:   Magnesium 1.5 (*)    All other components within normal limits  RESP PANEL BY RT-PCR (FLU A&B, COVID) ARPGX2  TSH    ____________________________________________   EKG  Interpreted by me Atrial fibrillation with rapid ventricular response, heart rate of 183.  Normal axis, normal intervals.  Normal QRS ST segments and T waves.  No ischemic changes.  ____________________________________________    RADIOLOGY  DG Chest 2 View  Result Date: 04/09/2021 CLINICAL DATA:  Heart palpitations. EXAM: CHEST - 2 VIEW COMPARISON:  CT angiogram chest 09/13/2019. Chest radiographs 09/13/2019. FINDINGS: Heart size within normal limits. Aortic atherosclerosis. No appreciable airspace consolidation or frank pulmonary edema. No evidence of pleural effusion or pneumothorax. No acute bony abnormality identified. Degenerative changes of the spine. IMPRESSION: No evidence of acute cardiopulmonary abnormality. A 7 mm right middle lobe pulmonary nodule was noted on the prior chest CT of 09/13/2019. Please refer to this prior report for further description and for CT chest follow-up recommendations. Aortic Atherosclerosis (ICD10-I70.0). Electronically Signed   By: Kellie Simmering D.O.   On: 04/09/2021 09:49    ____________________________________________   PROCEDURES .Critical Care Performed by: Carrie Mew, MD Authorized by: Carrie Mew, MD   Critical care provider statement:    Critical care time (minutes):  33   Critical care time was exclusive of:  Separately billable procedures and treating other patients   Critical care was necessary to treat or prevent imminent or life-threatening deterioration of the following conditions:  Cardiac failure   Critical care was time spent personally by me on the following activities:  Development of treatment plan with patient or surrogate, discussions with consultants, evaluation of patient's response to treatment, examination of patient, obtaining history from patient or surrogate, ordering and performing treatments and interventions, ordering and review of laboratory studies,  ordering and review of radiographic studies, pulse oximetry, re-evaluation of patient's condition and review of old charts  ____________________________________________  DIFFERENTIAL DIAGNOSIS   Electrolyte abnormality, dehydration, non-STEMI, hyperthyroidism, primary dysrhythmia  CLINICAL IMPRESSION / ASSESSMENT AND PLAN / ED COURSE  Medications ordered in the ED: Medications  sodium chloride 0.9 % bolus 1,000 mL (0 mLs Intravenous Stopped 04/09/21 1136)  diltiazem (CARDIZEM) injection 20 mg (20 mg Intravenous Given 04/09/21 0911)  diltiazem (CARDIZEM) injection 25 mg (25 mg Intravenous Given 04/09/21 0947)  magnesium sulfate IVPB 2 g 50 mL (0 g Intravenous Stopped 04/09/21 1210)  potassium chloride SA (KLOR-CON) CR tablet 40 mEq (40 mEq Oral Given 04/09/21 1058)    Pertinent labs & imaging results that were available during my care of the patient were reviewed by me and considered in my medical decision making (see chart for details).  Kari [name] was evaluated in Emergency Department on 04/09/2021 for the symptoms described in the history of present illness. She was evaluated in the context of the global COVID-19 pandemic, which necessitated consideration that the patient might be at risk for infection with the SARS-CoV-2 virus that causes COVID-19. Institutional protocols and algorithms that pertain to the evaluation of patients at risk for COVID-19 are in a state of rapid change based on information released by regulatory bodies including the CDC and federal and state organizations. These policies and algorithms were followed during the patient's care in the ED.   Patient presents with new onset of atrial fibrillation  with a heart rate of 150-180.  Blood pressure is normal, other vitals unremarkable.  She is not having any severe symptoms.  Will give IV diltiazem bolus to attempt rate control while checking labs and chest x-ray.    ----------------------------------------- 10:21  AM on 04/09/2021 ----------------------------------------- Heart rate somewhat improved after receiving a second bolus of IV diltiazem.  We will continue to monitor while waiting for chemistry panel.  Magnesium level is 1.5, will give magnesium IV bolus.  Clinical Course as of 04/09/21 1230  Mon Apr 09, 2021  1049 Still in afib/rvr despite two bolus of diltiazem. Will correct electrolytes and reassess need for dilt infusion and admission. [PS]  1224 Patient spontaneously converted to a normal sinus rhythm after IV fluid hydration and electrolyte replacement.  Repeat EKG shows a normal sinus rhythm with a rate of 74, normal intervals, no ischemic changes.  She feels much better and is stable for discharge to follow-up with cardiology.  She will plan to see Dr. Ellis Savage, who her husband also sees. [PS]  1229 Presentation not consistent with endocrine disorder such as addisonian crisis or severe thyroid dysfunction. [PS]    Clinical Course User Index [PS] Carrie Mew, MD  (Typo - Dr. Fletcher Anon)   ____________________________________________   FINAL CLINICAL IMPRESSION(S) / ED DIAGNOSES    Final diagnoses:  Atrial fibrillation, unspecified type ([place])  Hypokalemia  Hypomagnesemia     ED Discharge Orders          Ordered    metoprolol tartrate (LOPRESSOR) 25 MG tablet  2 times daily        04/09/21 1228            Portions of this note were generated with dragon dictation software. Dictation errors may occur despite best attempts at proofreading.    Carrie Mew, MD 04/09/21 1229

## 2021-04-09 NOTE — Discharge Instructions (Addendum)
Continue to take an 81mg  aspirin daily until you see cardiology.

## 2021-04-09 NOTE — ED Triage Notes (Signed)
Pt here with heart palpitations that started this am. Pt took an asa at 0750 this am. Pt states that she feels like her heart is beating fast. Pt denies heart hx. Pt in NAD in triage.

## 2021-04-09 NOTE — ED Notes (Signed)
Pt HR went from A fib into NSR, with HR of 77. Dr.Stafford notified, EKG done.

## 2021-04-10 ENCOUNTER — Encounter: Payer: Self-pay | Admitting: Cardiology

## 2021-04-10 NOTE — Progress Notes (Signed)
Primary Care Provider: [name], MD [facility] Cardiologist: None Electrophysiologist: None Vascular Surgeon: Kari Cabal, MD Pulmonologist: Dr. Raul Del [facility] [name])  Clinic Note: Chief Complaint  Patient presents with   New Patient (Initial Visit)    F/u ED afib. Meds reviewed verbally with pt.   Atrial Fibrillation    New diagnosis.Kari [name] to ER   Hospitalization Follow-up   ===================================  ASSESSMENT/PLAN   Problem List Items Addressed This Visit       Cardiology Problems   Paroxysmal atrial fibrillation with rapid ventricular response (HCC); CHA2DS2-VASc score 5 (HTN, DM-2, Age, Female, Aortic Plaque) (Chronic)    New diagnosis of PAF -presented with RVR.  Thankfully, she did not have any significant CHF or chest discomfort symptoms went her RVR.  She just simply felt her heart racing which made her feel tired.  She spontaneously converted to sinus rhythm with rate control therapy in the ER.  Was discharged on metoprolol which we will refill.  (25 mg twice daily.  She was not started on DOAC which we will do. ->  Female age 53 with hypertension diabetes and aortic/coronary plaque.  Chads Vascor 5.  Plan: Start Eliquis 5 mg twice daily For now continue metoprolol 20 mg twice daily-refill. With significant cardiac risk factors of hypertension hyperlipidemia and evidence of coronary calcification, need to exclude an ischemic etiology new-onset A. Fib. => We will check Coronary CT Angiogram with Possible FFRCT. Previously normal echocardiogram.  Now with new onset A. fib we will recheck echo just to ensure no change.      Relevant Medications   apixaban (ELIQUIS) 5 MG TABS tablet   metoprolol tartrate (LOPRESSOR) 25 MG tablet   Other Relevant Orders   EKG 70-VXBL   Basic metabolic panel (Completed)   Hyperlipidemia associated with type 2 diabetes mellitus (Kari [name]) - Primary (Chronic)    Most recent check shows 85  as LDL.  This is on standing dose of Lipitor 10 mg daily.  Relatively well controlled based on known risk factors.  Now with new onset A. fib, we are going to assess for ischemia with a coronary CTA.  This will also provide coronary calcium score.  This will allow for restratification.  We may need to target a lower LDL level.  Anticipate adjusting based on coronary CTA results.      Relevant Medications   apixaban (ELIQUIS) 5 MG TABS tablet   metoprolol tartrate (LOPRESSOR) 25 MG tablet   Other Relevant Orders   EKG 12-Lead   New onset atrial fibrillation (Kari [name])    New onset A. fib diagnosed during ER visit.  Lasted least 2 hours plus, resolved spontaneously with beta-blocker.   Plan: Ischemic evaluation with CORONARY CTA-FFRCT Check 2D echo Start Eliquis 5 mg twice daily Refill metoprolol 25 mg twice daily      Relevant Medications   apixaban (ELIQUIS) 5 MG TABS tablet   metoprolol tartrate (LOPRESSOR) 25 MG tablet   Other Relevant Orders   EKG 12-Lead   ECHOCARDIOGRAM COMPLETE   Essential hypertension (Chronic)    Blood pressure is elevated pretty significantly today.  She is on multiple medications including HCTZ and losartan along with metoprolol.  We can refill metoprolol, reassess pressures and follow-up.  If still elevated, anticipate increasing ARB to 100 mg daily.        Relevant Medications   apixaban (ELIQUIS) 5 MG TABS tablet   metoprolol tartrate (LOPRESSOR) 25 MG tablet   Other Relevant Orders  EKG 12-Lead   Coronary artery calcification seen on CAT scan (Chronic)    Cardiac risk factors include hypertension, hyperlipidemia and diabetes now with A. fib.  She has coronary calcification seen on CT scan and multivessel's.  As such, ischemic evaluation is warranted.  More definitive test would be a coronary CT angiogram versus Myoview.   This will quantitation and qualification of coronary artery calcification and atherosclerosis plaque.  FFRCT allows for  physiologic assessment of anatomic lesions.       Relevant Medications   apixaban (ELIQUIS) 5 MG TABS tablet   metoprolol tartrate (LOPRESSOR) 25 MG tablet   Other Relevant Orders   EKG 12-Lead   CT CORONARY MORPH W/CTA COR W/SCORE W/CA W/CM &/OR WO/CM   ECHOCARDIOGRAM COMPLETE     Other   Hypokalemia    She had hypokalemia in the setting of her A. fib, interesting.  We can reassess her chemistry panel today prior to CTA.  May require supplementation.  Need to monitor with her being on HCTZ.      Relevant Orders   Basic metabolic panel (Completed)    ===================================  HPI:    Kari [name] is a Morbidly Obese 75 y.o. female with PMH notable for DM-2 (with diabetic neuropathy - not on insulin), HLD, Vit D deficiency, COPD/(Moderate Persistent Asthma with Recurrent Bronchitis; complicated by Restrictive Lung Disease from Obesity-OHS, likely OSA), GERD & Bilateral LE Lynmphedema / Venous Stasis Edema, Gouty Arthritis who is being seen today for ER follow-up FOR ATRIAL FIBRILLATION at the request of Dr. Marene [name]  Kari [name] was Recently seen on 03/22/2021 [facility] with symptoms of pharyngitis-treated with Augmentin.  Recent Hospitalizations:  04/09/2021-[facility] ER: Presented with heart racing beginning at 0748.  Was constant.  No chest pain or dyspnea associated with it.  Previous episodes have not lasted more than a minute or 2.  This episode lasting 2-1/2 hours plus. => Noted to be in A. fib RVR-rates in the 140s. => Also noted to have hypokalemia.  Started on diltiazem drip and hydrated with IV fluids.  Spontaneously converted to sinus rhythm rate 74 bpm.   Reviewed  CV studies:    The following studies were reviewed today: (if available, images/films reviewed: From Epic Chart or Care Everywhere) CTA Chest-PE 09/13/2019: No PE.  Mild aortic atherosclerosis, mild coronary vascular calcification.  TTE 11/04/2019: EF 55 to 60%.  Normal LV  size and function.  GR 1 DD.  Unable to assess.  Mild LA dilation.  Normal aortic and mitral valve.  Normal RVP.  (Normal study)  Lower Extremity Venous Dopplers 01/11/2021:  Bilateral: - No evidence of deep vein thrombosis seen in the lower extremities, bilaterally, from the common femoral through the popliteal veins. - No evidence of superficial venous thrombosis in the lower extremities, bilaterally. Right: - No evidence of superficial venous reflux seen in the right greater saphenous vein. - No evidence of superficial venous reflux seen in the right short saphenous vein. - Venous reflux is noted in the right sapheno-femoral junction. Left: - No evidence of superficial venous reflux seen in the left greater saphenous vein. - No evidence of superficial venous reflux seen in the left short saphenous vein. - Venous reflux is noted in the left common femoral vein.   This patients CHA2DS2-VASc Score and unadjusted Ischemic Stroke Rate (% per year) is equal to 4.8 % stroke rate/year from a score of 4 Above score calculated as 1 point each if present [CHF, HTN, DM, Vascular=MI/PAD/Aortic  Plaque, Age if 29-74, or Female];  Above score calculated as 2 points each if present [Age > 75, or Stroke/TIA/TE]   Interval History:   Kari [name] presents here today essentially for hospital follow-up after recent diagnosis of A. fib.  She is currently in sinus rhythm with first-degree block.  She has not had any further episodes of tachycardia since leaving the hospital.  She says that her heart rate seemed to settle down after starting the metoprolol, but she does not have any more metoprolol after next week.  She is only given a short supply.  She said that she did not have chest pain or pressure when she had the tachycardia, just simply felt the heart racing.  She felt little bit lightheaded with it but never had any syncope or near syncope.  Prior to this episode she is only noticed made short little  bursts lasting a couple seconds of irregular heartbeats but this is the first time that she had anything lasting longer than a minute.    Otherwise, she actually has been doing fairly well.  Ever since she stopped taking steroids for her gouty arthritis, she said that she lost from 250 down to 210 pounds.  With that, she much better, her edema has improved dramatically.  She denies any PND orthopnea.  She now pretty much stopped taking the furosemide daily.  She may take it rarely PRN.  She is relatively active although she does not necessarily exercise she does try to get some walking in.  CV Review of Symptoms (Summary) Cardiovascular ROS: no chest pain or dyspnea on exertion positive for - irregular heartbeat, palpitations, rapid heart rate, and has not had any episodes since the ER visit.  Edema well controlled. negative for - orthopnea, paroxysmal nocturnal dyspnea, shortness of breath, or lightheadedness, dizziness or wooziness, syncope/near syncope or TIA/ amaurosis fugax, claudication  REVIEWED OF SYSTEMS   Review of Systems  Constitutional:  Positive for weight loss (Has lost down from 250 pounds to where she has now.). Negative for malaise/fatigue.  HENT:  Negative for nosebleeds.   Respiratory:  Negative for cough, shortness of breath and wheezing.   Cardiovascular:        Per HPI  Gastrointestinal:  Negative for blood in stool and melena.  Genitourinary:  Negative for flank pain and hematuria.  Musculoskeletal:  Positive for joint pain (Not really having any active arthritis pains.). Negative for back pain.  Neurological:  Negative for dizziness and focal weakness.  Psychiatric/Behavioral: Negative.     I have reviewed and (if needed) personally updated the patient's problem list, medications, allergies, past medical and surgical history, social and family history.   PAST MEDICAL HISTORY   Past Medical History:  Diagnosis Date   Allergy    to cat, dogs, pollen, dust,  cigarette smoke   Arthritis    feet   Cataract    removed both eyes   COPD (chronic obstructive [name] disease) with chronic bronchitis (HCC)    And moderate persistent asthma; complicated by Restrictive Lung Disease from obesity   Depression    GERD (gastroesophageal reflux disease)    Hx of adenomatous colonic polyps 05/30/2010   Hyperlipidemia    Hypertension    Lymphedema of both lower extremities    No superficial venous reflux noted.  However right-sided saphenofemoral junction reflux noted and left common femoral vein reflux noted.  No DVT or superficial thrombus.   Menopause    Migraines    Miscarriage  X 1   Moderate persistent asthma    PONV (postoperative nausea and vomiting)     PAST SURGICAL HISTORY   Past Surgical History:  Procedure Laterality Date   CATARACT EXTRACTION, BILATERAL     CESAREAN SECTION     X 2   COLONOSCOPY     DENTAL SURGERY  09/2016   POLYPECTOMY     TRANSTHORACIC ECHOCARDIOGRAM  11/04/2019   EF 55 to 60%.  Normal LV size and function.  GR 1 DD.  Unable to assess.  Mild LA dilation.  Normal aortic and mitral valve.  Normal RVP.  (Normal study)   TRANSTHORACIC ECHOCARDIOGRAM  10/2019   EF 55 to 60%.  Normal LV size and function.  GR 1 DD.  Unable to assess.  Mild LA dilation.  Normal aortic and mitral valve.  Normal RVP.  (Normal study)   TUBAL LIGATION  1980    Immunization History  Administered Date(s) Administered   Fluad Quad(high Dose 65+) 02/28/2020   Influenza, High Dose Seasonal PF 02/24/2018, 03/15/2019   Influenza,inj,Quad PF,6+ Mos 03/03/2013   Influenza-Unspecified 01/11/2014, 02/13/2015, 02/27/2016, 03/04/2017   PFIZER(Purple Top)SARS-COV-2 Vaccination 07/02/2019, 07/23/2019, 02/28/2020   Pneumococcal Conjugate-13 03/03/2013   Pneumococcal Polysaccharide-23 08/14/2015   Tdap 03/03/2008    MEDICATIONS/ALLERGIES   Current Meds  Medication Sig   albuterol (VENTOLIN HFA) 108 (90 Base) MCG/ACT inhaler Inhale 2 puffs  by mouth into the lungs every 6 hours as needed for wheezing or shortness of breath   allopurinol (ZYLOPRIM) 300 MG tablet Take 1 tablet (300 mg total) by mouth daily.   apixaban (ELIQUIS) 5 MG TABS tablet Take 1 tablet (5 mg total) by mouth 2 (two) times daily.   atorvastatin (LIPITOR) 10 MG tablet Take 1 tablet (10 mg total) by mouth daily.   benzonatate (TESSALON) 200 MG capsule Take 1 capsule (200 mg total) by mouth 3 (three) times daily as needed for cough.   cetirizine (ZYRTEC) 10 MG tablet Take 10 mg by mouth daily.   colchicine 0.6 MG tablet Take 1 tablet (0.6 mg total) by mouth 2 (two) times daily. As needed for gout flare.   diflorasone (PSORCON) 0.05 % cream Apply 1 application topically 2 (two) times daily.   esomeprazole (NEXIUM) 20 MG capsule Take 20 mg by mouth daily at 12 noon.   fluticasone-salmeterol (ADVAIR DISKUS) 250-50 MCG/ACT AEPB TAKE 1 PUFF BY MOUTH TWICE A DAY   FLUZONE HIGH-DOSE QUADRIVALENT 0.7 ML SUSY    hydrochlorothiazide (HYDRODIURIL) 25 MG tablet Take 1 tablet (25 mg total) by mouth daily.   losartan (COZAAR) 50 MG tablet Take 1 tablet (50 mg total) by mouth at bedtime.   mirtazapine (REMERON) 45 MG tablet Take 1 tablet (45 mg total) by mouth at bedtime.   montelukast (SINGULAIR) 10 MG tablet Take 1 tablet (10 mg total) by mouth at bedtime.   OVER THE COUNTER MEDICATION Nasocort nasal spray   Probiotic Product (PROBIOTIC PO) Take by mouth.   traZODone (DESYREL) 50 MG tablet Take 0.5-1 tablets (25-50 mg total) by mouth at bedtime as needed for sleep.   triamcinolone lotion (KENALOG) 0.1 % Apply to affected area(s) twice a day   [DISCONTINUED] aspirin EC 81 MG tablet Take 81 mg by mouth daily. Swallow whole.   [DISCONTINUED] metoprolol tartrate (LOPRESSOR) 25 MG tablet Take 1 tablet (25 mg total) by mouth 2 (two) times daily.   Current Facility-Administered Medications for the 04/11/21 encounter (Office Visit) with Leonie Man, MD  Medication   0.9 %  sodium chloride infusion    Allergies  Allergen Reactions   Nifedipine     REACTION: swollen feet    SOCIAL HISTORY/FAMILY HISTORY   Reviewed in Epic:   Social History   Tobacco Use   Smoking status: Former    Types: Cigarettes    Quit date: 01/02/1971    Years since quitting: 50.3   Smokeless tobacco: Never  Vaping Use   Vaping Use: Never used  Substance Use Topics   Alcohol use: Not Currently    Comment: occasional- wine gives pt headache only drinks once every 6 months   Drug use: No   Social History   Social History Narrative   Not on file   Family History  Problem Relation Age of Onset   Myasthenia gravis Mother    Cervical cancer Mother    Heart disease Father    Coronary artery disease Father    Valvular heart disease Father    Heart attack Father 58   Cancer Sister 62       colon   Colon cancer Sister 64   Heart Problems Sister    Heart Problems Brother    Heart attack Paternal Grandfather        16's   Breast cancer Neg Hx    Esophageal cancer Neg Hx    Colon polyps Neg Hx    Rectal cancer Neg Hx    Stomach cancer Neg Hx     OBJCTIVE -PE, EKG, labs   Wt Readings from Last 3 Encounters:  04/11/21 209 lb 4 oz (94.9 kg)  04/09/21 210 lb (95.3 kg)  03/22/21 212 lb 9.6 oz (96.4 kg)    Physical Exam: BP (!) 150/78 (BP Location: Right Arm, Patient Position: Sitting, Cuff Size: Normal)   Pulse 60   Ht 5' 4.5" (1.638 m)   Wt 209 lb 4 oz (94.9 kg)   SpO2 98%   BMI 35.36 kg/m  Physical Exam Vitals reviewed.  Constitutional:      General: She is not in acute distress.    Appearance: Normal appearance. She is obese. She is not ill-appearing.     Comments: Well-groomed.  Healthy-appearing.  HENT:     Head: Normocephalic and atraumatic.  Neck:     Vascular: No carotid bruit or JVD.  Cardiovascular:     Rate and Rhythm: Normal rate and regular rhythm. Occasional Extrasystoles are present.    Chest Wall: PMI is not displaced.     Pulses:  Intact distal pulses. No decreased pulses.     Heart sounds: S1 normal and S2 normal. Heart sounds are distant. No murmur heard.   No friction rub. No gallop.  [name]:     Effort: [name] effort is normal. No respiratory distress.     Breath sounds: Normal breath sounds. No wheezing, rhonchi or rales.  Chest:     Chest wall: No tenderness.  Abdominal:     General: Abdomen is flat. Bowel sounds are normal. There is no distension.     Palpations: Abdomen is soft. There is no mass (No HSM or bruit).     Tenderness: There is no abdominal tenderness.     Comments: Truncal obesity  Musculoskeletal:        General: No swelling.     Cervical back: Normal range of motion and neck supple.  Skin:    General: Skin is warm and dry.     Coloration: Skin is not jaundiced or pale.  Neurological:     General:  No focal deficit present.     Mental Status: She is alert and oriented to person, place, and time.     Gait: Gait normal.  Psychiatric:        Mood and Affect: Mood normal.        Behavior: Behavior normal.        Thought Content: Thought content normal.        Judgment: Judgment normal.     Adult ECG Report  Rate: 60;  Rhythm: normal sinus rhythm and 1 AVB; low voltage.  Cannot rule out anterior MI, age-indeterminate. ;   Narrative Interpretation: Borderline EKG.  EKG from ER 04/09/2021:  Rate: 183 ;  Rhythm: atrial fibrillation and rapid ventricular rate with PVCs/aberrantly conducted beats.  Cannot exclude anterior infarction age indeterminate.  Low voltage. ;   Narrative Interpretation: New onset A. fib RVR -> Follow-up EKG showed sinus rhythm, rate 76 bpm.  Recent Labs: Reviewed Lab Results  Component Value Date   CHOL 166 09/08/2020   HDL 40 (L) 09/08/2020   LDLCALC 85 09/08/2020   LDLDIRECT 92.0 02/18/2019   TRIG 337 (H) 09/08/2020   CHOLHDL 4.2 09/08/2020   .  Chemistry panel checked today. Lab Results  Component Value Date   CREATININE 0.90 04/11/2021   BUN 11  04/11/2021   NA 134 04/11/2021   K 4.4 04/11/2021   CL 98 04/11/2021   CO2 24 04/11/2021   CBC Latest Ref Rng & Units 04/09/2021 09/13/2019 07/23/2017  WBC 4.0 - 10.5 K/uL 8.2 10.2 9.5  Hemoglobin 12.0 - 15.0 g/dL 11.9(L) 12.4 12.9  Hematocrit 36.0 - 46.0 % 34.3(L) 35.2(L) 37.6  Platelets 150 - 400 K/uL 264 213 235.0    Lab Results  Component Value Date   HGBA1C 5.8 (A) 09/08/2020   Lab Results  Component Value Date   TSH 1.900 04/09/2021    ==================================================  COVID-19 Education: The signs and symptoms of COVID-19 were discussed with the patient and how to seek care for testing (follow up with PCP or arrange E-visit).    I spent a total of 40 minutes with the patient spent in direct patient consultation.  Additional time spent with chart review  / charting (studies, outside notes, etc): 12 min (25 min pre-charting) Total Time: 77 min  Current medicines are reviewed at length with the patient today.  (+/- concerns) N/A  This visit occurred during the SARS-CoV-2 public health emergency.  Safety protocols were in place, including screening questions prior to the visit, additional usage of staff PPE, and extensive cleaning of exam room while observing appropriate contact time as indicated for disinfecting solutions.  Notice: This dictation was prepared with Dragon dictation along with smart phrase technology. Any transcriptional errors that result from this process are unintentional and may not be corrected upon review.   Studies Ordered:  Orders Placed This Encounter  Procedures   CT CORONARY MORPH W/CTA COR W/SCORE W/CA W/CM &/OR WO/CM   Basic metabolic panel   EKG 76-PPJK   ECHOCARDIOGRAM COMPLETE     Patient Instructions / Medication Changes & Studies & Tests Ordered   Patient Instructions  Medication Instructions:  - Your physician has recommended you make the following change in your medication:   1) START Eliquis 5 mg: - take 1  tablet by mouth TWICE daily   Samples Given: Eliquis 5 mg Lot: [id number] Exp: 03/2023 # 2 boxes  2) STOP aspirin  *If you need a refill on your cardiac medications before your next  appointment, please call your pharmacy*   Lab Work: - Your physician recommends that you have lab work today: BMP  If you have labs (blood work) drawn today and your tests are completely normal, you will receive your results only by: MyChart Message (if you have MyChart) OR A paper copy in the mail If you have any lab test that is abnormal or we need to change your treatment, we will call you to review the results.   Testing/Procedures:  1) Echocardiogram: - Your physician has requested that you have an echocardiogram. Echocardiography is a painless test that uses sound waves to create images of your heart. It provides your doctor with information about the size and shape of your heart and how well your heart's chambers and valves are working. This procedure takes approximately one hour. There are no restrictions for this procedure. There is a possibility that an IV may need to be started during your test to inject an image enhancing agent. This is done to obtain more optimal pictures of your heart. Therefore we ask that you do at least drink some water prior to coming in to hydrate your veins.    2) Cardiac CT angiogram: - Your physician has requested that you have cardiac CT. Cardiac computed tomography (CT) is a painless test that uses an x-ray machine to take clear, detailed pictures of your heart.    Follow-Up: At [facility], you and your health needs are our priority.  As part of our continuing mission to provide you with exceptional heart care, we have created designated Provider Care Teams.  These Care Teams include your primary Cardiologist (physician) and Advanced Practice Providers (APPs -  Physician Assistants and Nurse Practitioners) who all work together to provide you with the care you  need, when you need it.  We recommend signing up for the patient portal called "MyChart".  Sign up information is provided on this After Visit Summary.  MyChart is used to connect with patients for Virtual Visits (Telemedicine).  Patients are able to view lab/test results, encounter notes, upcoming appointments, etc.  Non-urgent messages can be sent to your provider as well.   To learn more about what you can do with MyChart, go to [email or web address].    Your next appointment:   After all testing is completed   The format for your next appointment:   In Person  Provider:   Glenetta Hew, MD    Other Instructions  ELIQUIS (Apixaban) Tablets What is this medication? APIXABAN (a PIX a ban) prevents or treats blood clots. It is also used to lower the risk of stroke in people with AFib (atrial fibrillation). It belongs to a group of medications called blood thinners. This medicine may be used for other purposes; ask your health care provider or pharmacist if you have questions. COMMON BRAND NAME(S): Eliquis What should I tell my care team before I take this medication? They need to know if you have any of these conditions: Antiphospholipid antibody syndrome Bleeding disorder History of bleeding in the brain History of blood clots History of stomach bleeding Kidney disease Liver disease Mechanical heart valve Spinal surgery An unusual or allergic reaction to apixaban, other medications, foods, dyes, or preservatives Pregnant or trying to get pregnant Breast-feeding How should I use this medication? Take this medication by mouth. For your therapy to work as well as possible, take each dose exactly as prescribed on the prescription label. Do not skip doses. Skipping doses or stopping this medication can increase  your risk of a blood clot or stroke. Keep taking this medication unless your care team tells you to stop. Take it as directed on the prescription label at the same time  every day. You can take it with or without food. If it upsets your stomach, take it with food. A special MedGuide will be given to you by the pharmacist with each prescription and refill. Be sure to read this information carefully each time. Talk to your care team about the use of this medication in children. Special care may be needed. Overdosage: If you think you have taken too much of this medicine contact a poison control center or emergency room at once. NOTE: This medicine is only for you. Do not share this medicine with others. What if I miss a dose? If you miss a dose, take it as soon as you can. If it is almost time for your next dose, take only that dose. Do not take double or extra doses. What may interact with this medication? This medication may interact with the following: Aspirin and aspirin-like medications Certain medications for fungal infections like itraconazole and ketoconazole Certain medications for seizures like carbamazepine and phenytoin Certain medications for blood clots like enoxaparin, dalteparin, heparin, and warfarin Clarithromycin NSAIDs, medications for pain and inflammation, like ibuprofen or naproxen Rifampin Ritonavir St. John's wort This list may not describe all possible interactions. Give your health care provider a list of all the medicines, herbs, non-prescription drugs, or dietary supplements you use. Also tell them if you smoke, drink alcohol, or use illegal drugs. Some items may interact with your medicine. What should I watch for while using this medication? Visit your healthcare professional for regular checks on your progress. You may need blood work done while you are taking this medication. Your condition will be monitored carefully while you are receiving this medication. It is important not to miss any appointments. Avoid sports and activities that might cause injury while you are using this medication. Severe falls or injuries can cause unseen  bleeding. Be careful when using sharp tools or knives. Consider using an Copy. Take special care brushing or flossing your teeth. Report any injuries, bruising, or red spots on the skin to your healthcare professional. If you are going to need surgery or other procedure, tell your healthcare professional that you are taking this medication. Wear a medical ID bracelet or chain. Carry a card that describes your disease and details of your medication and dosage times. What side effects may I notice from receiving this medication? Side effects that you should report to your care team as soon as possible: Allergic reactions--skin rash, itching, hives, swelling of the face, lips, tongue, or throat Bleeding--bloody or black, tar-like stools, vomiting blood or brown material that looks like coffee grounds, red or dark brown urine, small red or purple spots on the skin, unusual bruising or bleeding Bleeding in the brain--severe headache, stiff neck, confusion, dizziness, change in vision, numbness or weakness of the face, arm, or leg, trouble speaking, trouble walking, vomiting Heavy periods This list may not describe all possible side effects. Call your doctor for medical advice about side effects. You may report side effects to FDA at 1-800-FDA-1088. Where should I keep my medication? Keep out of the reach of children and pets. Store at room temperature between 20 and 25 degrees C (68 and 77 degrees F). Get rid of any unused medication after the expiration date. To get rid of medications that are no longer  needed or expired: Take the medication to a medication take-back program. Check with your pharmacy or law enforcement to find a location. If you cannot return the medication, check the label or package insert to see if the medication should be thrown out in the garbage or flushed down the toilet. If you are not sure, ask your care team. If it is safe to put in the trash, empty the medication out  of the container. Mix the medication with cat litter, dirt, coffee grounds, or other unwanted substance. Seal the mixture in a bag or container. Put it in the trash. NOTE: This sheet is a summary. It may not cover all possible information. If you have questions about this medicine, talk to your doctor, pharmacist, or health care provider.  2022 Elsevier/Gold Standard (2020-05-26 00:00:00)   Echocardiogram An echocardiogram is a test that uses sound waves (ultrasound) to produce images of the heart. Images from an echocardiogram can provide important information about: Heart size and shape. The size and thickness and movement of your heart's walls. Heart muscle function and strength. Heart valve function or if you have stenosis. Stenosis is when the heart valves are too narrow. If blood is flowing backward through the heart valves (regurgitation). A tumor or infectious growth around the heart valves. Areas of heart muscle that are not working well because of poor blood flow or injury from a heart attack. Aneurysm detection. An aneurysm is a weak or damaged part of an artery wall. The wall bulges out from the normal force of blood pumping through the body. Tell a health care provider about: Any allergies you have. All medicines you are taking, including vitamins, herbs, eye drops, creams, and over-the-counter medicines. Any blood disorders you have. Any surgeries you have had. Any medical conditions you have. Whether you are pregnant or may be pregnant. What are the risks? Generally, this is a safe test. However, problems may occur, including an allergic reaction to dye (contrast) that may be used during the test. What happens before the test? No specific preparation is needed. You may eat and drink normally. What happens during the test?  You will take off your clothes from the waist up and put on a hospital gown. Electrodes or electrocardiogram (ECG)patches may be placed on your chest.  The electrodes or patches are then connected to a device that monitors your heart rate and rhythm. You will lie down on a table for an ultrasound exam. A gel will be applied to your chest to help sound waves pass through your skin. A handheld device, called a transducer, will be pressed against your chest and moved over your heart. The transducer produces sound waves that travel to your heart and bounce back (or "echo" back) to the transducer. These sound waves will be captured in real-time and changed into images of your heart that can be viewed on a video monitor. The images will be recorded on a computer and reviewed by your health care provider. You may be asked to change positions or hold your breath for a short time. This makes it easier to get different views or better views of your heart. In some cases, you may receive contrast through an IV in one of your veins. This can improve the quality of the pictures from your heart. The procedure may vary among health care providers and hospitals. What can I expect after the test? You may return to your normal, everyday life, including diet, activities, and medicines, unless your health care  provider tells you not to do that. Follow these instructions at home: It is up to you to get the results of your test. Ask your health care provider, or the department that is doing the test, when your results will be ready. Keep all follow-up visits. This is important. Summary An echocardiogram is a test that uses sound waves (ultrasound) to produce images of the heart. Images from an echocardiogram can provide important information about the size and shape of your heart, heart muscle function, heart valve function, and other possible heart problems. You do not need to do anything to prepare before this test. You may eat and drink normally. After the echocardiogram is completed, you may return to your normal, everyday life, unless your health care provider tells  you not to do that. This information is not intended to replace advice given to you by your health care provider. Make sure you discuss any questions you have with your health care provider. Document Revised: 01/10/2021 Document Reviewed: 12/21/2019 Elsevier Patient Education  [place].   Cardiac CT Angiogram A cardiac CT angiogram is a procedure to look at the heart and the area around the heart. It may be done to help find the cause of chest pains or other symptoms of heart disease. During this procedure, a substance called contrast dye is injected into the blood vessels in the area to be checked. A large X-ray machine, called a CT scanner, then takes detailed pictures of the heart and the surrounding area. The procedure is also sometimes called a coronary CT angiogram, coronary artery scanning, or CTA. A cardiac CT angiogram allows the health care provider to see how well blood is flowing to and from the heart. The health care provider will be able to see if there are any problems, such as: Blockage or narrowing of the coronary arteries in the heart. Fluid around the heart. Signs of weakness or disease in the muscles, valves, and tissues of the heart. Tell a health care provider about: Any allergies you have. This is especially important if you have had a previous allergic reaction to contrast dye. All medicines you are taking, including vitamins, herbs, eye drops, creams, and over-the-counter medicines. Any blood disorders you have. Any surgeries you have had. Any medical conditions you have. Whether you are pregnant or may be pregnant. Any anxiety disorders, chronic pain, or other conditions you have that may increase your stress or prevent you from lying still. What are the risks? Generally, this is a safe procedure. However, problems may occur, including: Bleeding. Infection. Allergic reactions to medicines or dyes. Damage to other structures or organs. Kidney damage from  the contrast dye that is used. Increased risk of cancer from radiation exposure. This risk is low. Talk with your health care provider about: The risks and benefits of testing. How you can receive the lowest dose of radiation. What happens before the procedure? Wear comfortable clothing and remove any jewelry, glasses, dentures, and hearing aids. Follow instructions from your health care provider about eating and drinking. This may include: For 12 hours before the procedure -- avoid caffeine. This includes tea, coffee, soda, energy drinks, and diet pills. Drink plenty of water or other fluids that do not have caffeine in them. Being well hydrated can prevent complications. For 4-6 hours before the procedure -- stop eating and drinking. The contrast dye can cause nausea, but this is less likely if your stomach is empty. Ask your health care provider about changing or stopping  your regular medicines. This is especially important if you are taking diabetes medicines, blood thinners, or medicines to treat problems with erections (erectile dysfunction). What happens during the procedure?  Hair on your chest may need to be removed so that small sticky patches called electrodes can be placed on your chest. These will transmit information that helps to monitor your heart during the procedure. An IV will be inserted into one of your veins. You might be given a medicine to control your heart rate during the procedure. This will help to ensure that good images are obtained. You will be asked to lie on an exam table. This table will slide in and out of the CT machine during the procedure. Contrast dye will be injected into the IV. You might feel warm, or you may get a metallic taste in your mouth. You will be given a medicine called nitroglycerin. This will relax or dilate the arteries in your heart. The table that you are lying on will move into the CT machine tunnel for the scan. The person running the  machine will give you instructions while the scans are being done. You may be asked to: Keep your arms above your head. Hold your breath. Stay very still, even if the table is moving. When the scanning is complete, you will be moved out of the machine. The IV will be removed. The procedure may vary among health care providers and hospitals. What can I expect after the procedure? After your procedure, it is common to have: A metallic taste in your mouth from the contrast dye. A feeling of warmth. A headache from the nitroglycerin. Follow these instructions at home: Take over-the-counter and prescription medicines only as told by your health care provider. If you are told, drink enough fluid to keep your urine pale yellow. This will help to flush the contrast dye out of your body. Most people can return to their normal activities right after the procedure. Ask your health care provider what activities are safe for you. It is up to you to get the results of your procedure. Ask your health care provider, or the department that is doing the procedure, when your results will be ready. Keep all follow-up visits as told by your health care provider. This is important. Contact a health care provider if: You have any symptoms of allergy to the contrast dye. These include: Shortness of breath. Rash or hives. A racing heartbeat. Summary A cardiac CT angiogram is a procedure to look at the heart and the area around the heart. It may be done to help find the cause of chest pains or other symptoms of heart disease. During this procedure, a large X-ray machine, called a CT scanner, takes detailed pictures of the heart and the surrounding area after a contrast dye has been injected into blood vessels in the area. Ask your health care provider about changing or stopping your regular medicines before the procedure. This is especially important if you are taking diabetes medicines, blood thinners, or medicines  to treat erectile dysfunction. If you are told, drink enough fluid to keep your urine pale yellow. This will help to flush the contrast dye out of your body. This information is not intended to replace advice given to you by your health care provider. Make sure you discuss any questions you have with your health care provider. Document Revised: 01/10/2021 Document Reviewed: 12/23/2018 Elsevier Patient Education  2022 [employer]    Glenetta Hew, M.D., M.S. Interventional Cardiologist  Pager # [phone] Phone # [phone] [address] [place], [place] [address]   Thank you for choosing [facility] [place]!!

## 2021-04-11 ENCOUNTER — Encounter: Payer: Self-pay | Admitting: Cardiology

## 2021-04-11 ENCOUNTER — Ambulatory Visit: Payer: PPO | Admitting: Cardiology

## 2021-04-11 ENCOUNTER — Other Ambulatory Visit: Payer: Self-pay

## 2021-04-11 VITALS — BP 150/78 | HR 60 | Ht 64.5 in | Wt 209.2 lb

## 2021-04-11 DIAGNOSIS — I10 Essential (primary) hypertension: Secondary | ICD-10-CM | POA: Diagnosis not present

## 2021-04-11 DIAGNOSIS — E876 Hypokalemia: Secondary | ICD-10-CM | POA: Diagnosis not present

## 2021-04-11 DIAGNOSIS — I2510 Atherosclerotic heart disease of native coronary artery without angina pectoris: Secondary | ICD-10-CM

## 2021-04-11 DIAGNOSIS — E1169 Type 2 diabetes mellitus with other specified complication: Secondary | ICD-10-CM

## 2021-04-11 DIAGNOSIS — E785 Hyperlipidemia, unspecified: Secondary | ICD-10-CM | POA: Diagnosis not present

## 2021-04-11 DIAGNOSIS — I480 Paroxysmal atrial fibrillation: Secondary | ICD-10-CM | POA: Diagnosis not present

## 2021-04-11 DIAGNOSIS — I4891 Unspecified atrial fibrillation: Secondary | ICD-10-CM

## 2021-04-11 DIAGNOSIS — R931 Abnormal findings on diagnostic imaging of heart and coronary circulation: Secondary | ICD-10-CM | POA: Insufficient documentation

## 2021-04-11 MED ORDER — METOPROLOL TARTRATE 25 MG PO TABS: 25.0000 mg | ORAL_TABLET | Freq: Two times a day (BID) | ORAL | 1 refills | Status: DC

## 2021-04-11 MED ORDER — APIXABAN 5 MG PO TABS: 5.0000 mg | ORAL_TABLET | Freq: Two times a day (BID) | ORAL | 3 refills | Status: DC

## 2021-04-11 NOTE — Patient Instructions (Addendum)
Medication Instructions:  - Your physician has recommended you make the following change in your medication:   1) START Eliquis 5 mg: - take 1 tablet by mouth TWICE daily   Samples Given: Eliquis 5 mg Lot: [id number] Exp: 03/2023 # 2 boxes  2) STOP aspirin  *If you need a refill on your cardiac medications before your next appointment, please call your pharmacy*   Lab Work: - Your physician recommends that you have lab work today: BMP  If you have labs (blood work) drawn today and your tests are completely normal, you will receive your results only by: [place] (if you have [place]) OR A paper copy in the mail If you have any lab test that is abnormal or we need to change your treatment, we will call you to review the results.   Testing/Procedures:  1) Echocardiogram: - Your physician has requested that you have an echocardiogram. Echocardiography is a painless test that uses sound waves to create images of your heart. It provides your doctor with information about the size and shape of your heart and how well your heart's chambers and valves are working. This procedure takes approximately one hour. There are no restrictions for this procedure. There is a possibility that an IV may need to be started during your test to inject an image enhancing agent. This is done to obtain more optimal pictures of your heart. Therefore we ask that you do at least drink some water prior to coming in to hydrate your veins.    2) Cardiac CT angiogram: - Your physician has requested that you have cardiac CT. Cardiac computed tomography (CT) is a painless test that uses an x-ray machine to take clear, detailed pictures of your heart.     Your cardiac CT will be scheduled at:    [facility] [address] [place], [place] [address] [phone]   If scheduled at [facility], please arrive 15 mins early for check-in  and test prep.  Please follow these instructions carefully (unless otherwise directed):   On the Night Before the Test: Be sure to Drink plenty of water. Do not consume any caffeinated/decaffeinated beverages or chocolate 12 hours prior to your test. Do not take any antihistamines 12 hours prior to your test.   On the Day of the Test: Drink plenty of water until 1 hour prior to the test. Do not eat any food 4 hours prior to the test. You may take your regular medications prior to the test.  Take metoprolol (Lopressor) as normal the morning of your test. HOLD Furosemide/Hydrochlorothiazide morning of the test. FEMALES- please wear underwire-free bra if available, avoid dresses & tight clothing        After the Test: Drink plenty of water. After receiving IV contrast, you may experience a mild flushed feeling. This is normal. On occasion, you may experience a mild rash up to 24 hours after the test. This is not dangerous. If this occurs, you can take Benadryl 25 mg and increase your fluid intake. If you experience trouble breathing, this can be serious. If it is severe call 911 IMMEDIATELY. If it is mild, please call our office.     For non-scheduling related questions, please contact the cardiac imaging nurse navigator should you have any questions/concerns: Marchia Bond, Cardiac Imaging Nurse Navigator Gordy Clement, Cardiac Imaging Nurse Navigator [facility] Direct Office Dial: [phone]   For scheduling needs, including cancellations and  rescheduling, please call [place], [phone].     Follow-Up: At [facility], you and your health needs are our priority.  As part of our continuing mission to provide you with exceptional heart care, we have created designated Provider Care Teams.  These Care Teams include your primary Cardiologist (physician) and Advanced Practice Providers (APPs -  Physician Assistants and Nurse Practitioners) who all  work together to provide you with the care you need, when you need it.  We recommend signing up for the patient portal called "MyChart".  Sign up information is provided on this After Visit Summary.  MyChart is used to connect with patients for Virtual Visits (Telemedicine).  Patients are able to view lab/test results, encounter notes, upcoming appointments, etc.  Non-urgent messages can be sent to your provider as well.   To learn more about what you can do with MyChart, go to [email or web address].    Your next appointment:   After all testing is completed   The format for your next appointment:   In Person  Provider:   Glenetta Hew, MD    Other Instructions  ELIQUIS (Apixaban) Tablets What is this medication? APIXABAN (a PIX a ban) prevents or treats blood clots. It is also used to lower the risk of stroke in people with AFib (atrial fibrillation). It belongs to a group of medications called blood thinners. This medicine may be used for other purposes; ask your health care provider or pharmacist if you have questions. COMMON BRAND NAME(S): Eliquis What should I tell my care team before I take this medication? They need to know if you have any of these conditions: Antiphospholipid antibody syndrome Bleeding disorder History of bleeding in the brain History of blood clots History of stomach bleeding Kidney disease Liver disease Mechanical heart valve Spinal surgery An unusual or allergic reaction to apixaban, other medications, foods, dyes, or preservatives Pregnant or trying to get pregnant Breast-feeding How should I use this medication? Take this medication by mouth. For your therapy to work as well as possible, take each dose exactly as prescribed on the prescription label. Do not skip doses. Skipping doses or stopping this medication can increase your risk of a blood clot or stroke. Keep taking this medication unless your care team tells you to stop. Take it as directed  on the prescription label at the same time every day. You can take it with or without food. If it upsets your stomach, take it with food. A special MedGuide will be given to you by the pharmacist with each prescription and refill. Be sure to read this information carefully each time. Talk to your care team about the use of this medication in children. Special care may be needed. Overdosage: If you think you have taken too much of this medicine contact a poison control center or emergency room at once. NOTE: This medicine is only for you. Do not share this medicine with others. What if I miss a dose? If you miss a dose, take it as soon as you can. If it is almost time for your next dose, take only that dose. Do not take double or extra doses. What may interact with this medication? This medication may interact with the following: Aspirin and aspirin-like medications Certain medications for fungal infections like itraconazole and ketoconazole Certain medications for seizures like carbamazepine and phenytoin Certain medications for blood clots like enoxaparin, dalteparin, heparin, and warfarin Clarithromycin NSAIDs, medications for pain and inflammation, like ibuprofen or naproxen Rifampin Ritonavir St. John's wort  This list may not describe all possible interactions. Give your health care provider a list of all the medicines, herbs, non-prescription drugs, or dietary supplements you use. Also tell them if you smoke, drink alcohol, or use illegal drugs. Some items may interact with your medicine. What should I watch for while using this medication? Visit your healthcare professional for regular checks on your progress. You may need blood work done while you are taking this medication. Your condition will be monitored carefully while you are receiving this medication. It is important not to miss any appointments. Avoid sports and activities that might cause injury while you are using this  medication. Severe falls or injuries can cause unseen bleeding. Be careful when using sharp tools or knives. Consider using an Copy. Take special care brushing or flossing your teeth. Report any injuries, bruising, or red spots on the skin to your healthcare professional. If you are going to need surgery or other procedure, tell your healthcare professional that you are taking this medication. Wear a medical ID bracelet or chain. Carry a card that describes your disease and details of your medication and dosage times. What side effects may I notice from receiving this medication? Side effects that you should report to your care team as soon as possible: Allergic reactions--skin rash, itching, hives, swelling of the face, lips, tongue, or throat Bleeding--bloody or black, tar-like stools, vomiting blood or brown material that looks like coffee grounds, red or dark brown urine, small red or purple spots on the skin, unusual bruising or bleeding Bleeding in the brain--severe headache, stiff neck, confusion, dizziness, change in vision, numbness or weakness of the face, arm, or leg, trouble speaking, trouble walking, vomiting Heavy periods This list may not describe all possible side effects. Call your doctor for medical advice about side effects. You may report side effects to FDA at 1-800-FDA-1088. Where should I keep my medication? Keep out of the reach of children and pets. Store at room temperature between 20 and 25 degrees C (68 and 77 degrees F). Get rid of any unused medication after the expiration date. To get rid of medications that are no longer needed or expired: Take the medication to a medication take-back program. Check with your pharmacy or law enforcement to find a location. If you cannot return the medication, check the label or package insert to see if the medication should be thrown out in the garbage or flushed down the toilet. If you are not sure, ask your care team. If it  is safe to put in the trash, empty the medication out of the container. Mix the medication with cat litter, dirt, coffee grounds, or other unwanted substance. Seal the mixture in a bag or container. Put it in the trash. NOTE: This sheet is a summary. It may not cover all possible information. If you have questions about this medicine, talk to your doctor, pharmacist, or health care provider.  2022 Elsevier/Gold Standard (2020-05-26 00:00:00)   Echocardiogram An echocardiogram is a test that uses sound waves (ultrasound) to produce images of the heart. Images from an echocardiogram can provide important information about: Heart size and shape. The size and thickness and movement of your heart's walls. Heart muscle function and strength. Heart valve function or if you have stenosis. Stenosis is when the heart valves are too narrow. If blood is flowing backward through the heart valves (regurgitation). A tumor or infectious growth around the heart valves. Areas of heart muscle that are not working well  because of poor blood flow or injury from a heart attack. Aneurysm detection. An aneurysm is a weak or damaged part of an artery wall. The wall bulges out from the normal force of blood pumping through the body. Tell a health care provider about: Any allergies you have. All medicines you are taking, including vitamins, herbs, eye drops, creams, and over-the-counter medicines. Any blood disorders you have. Any surgeries you have had. Any medical conditions you have. Whether you are pregnant or may be pregnant. What are the risks? Generally, this is a safe test. However, problems may occur, including an allergic reaction to dye (contrast) that may be used during the test. What happens before the test? No specific preparation is needed. You may eat and drink normally. What happens during the test?  You will take off your clothes from the waist up and put on a hospital gown. Electrodes or  electrocardiogram (ECG)patches may be placed on your chest. The electrodes or patches are then connected to a device that monitors your heart rate and rhythm. You will lie down on a table for an ultrasound exam. A gel will be applied to your chest to help sound waves pass through your skin. A handheld device, called a transducer, will be pressed against your chest and moved over your heart. The transducer produces sound waves that travel to your heart and bounce back (or "echo" back) to the transducer. These sound waves will be captured in real-time and changed into images of your heart that can be viewed on a video monitor. The images will be recorded on a computer and reviewed by your health care provider. You may be asked to change positions or hold your breath for a short time. This makes it easier to get different views or better views of your heart. In some cases, you may receive contrast through an IV in one of your veins. This can improve the quality of the pictures from your heart. The procedure may vary among health care providers and hospitals. What can I expect after the test? You may return to your normal, everyday life, including diet, activities, and medicines, unless your health care provider tells you not to do that. Follow these instructions at home: It is up to you to get the results of your test. Ask your health care provider, or the department that is doing the test, when your results will be ready. Keep all follow-up visits. This is important. Summary An echocardiogram is a test that uses sound waves (ultrasound) to produce images of the heart. Images from an echocardiogram can provide important information about the size and shape of your heart, heart muscle function, heart valve function, and other possible heart problems. You do not need to do anything to prepare before this test. You may eat and drink normally. After the echocardiogram is completed, you may return to your  normal, everyday life, unless your health care provider tells you not to do that. This information is not intended to replace advice given to you by your health care provider. Make sure you discuss any questions you have with your health care provider. Document Revised: 01/10/2021 Document Reviewed: 12/21/2019 Elsevier Patient Education  [place].   Cardiac CT Angiogram A cardiac CT angiogram is a procedure to look at the heart and the area around the heart. It may be done to help find the cause of chest pains or other symptoms of heart disease. During this procedure, a substance called contrast dye is injected  into the blood vessels in the area to be checked. A large X-ray machine, called a CT scanner, then takes detailed pictures of the heart and the surrounding area. The procedure is also sometimes called a coronary CT angiogram, coronary artery scanning, or CTA. A cardiac CT angiogram allows the health care provider to see how well blood is flowing to and from the heart. The health care provider will be able to see if there are any problems, such as: Blockage or narrowing of the coronary arteries in the heart. Fluid around the heart. Signs of weakness or disease in the muscles, valves, and tissues of the heart. Tell a health care provider about: Any allergies you have. This is especially important if you have had a previous allergic reaction to contrast dye. All medicines you are taking, including vitamins, herbs, eye drops, creams, and over-the-counter medicines. Any blood disorders you have. Any surgeries you have had. Any medical conditions you have. Whether you are pregnant or may be pregnant. Any anxiety disorders, chronic pain, or other conditions you have that may increase your stress or prevent you from lying still. What are the risks? Generally, this is a safe procedure. However, problems may occur, including: Bleeding. Infection. Allergic reactions to medicines or  dyes. Damage to other structures or organs. Kidney damage from the contrast dye that is used. Increased risk of cancer from radiation exposure. This risk is low. Talk with your health care provider about: The risks and benefits of testing. How you can receive the lowest dose of radiation. What happens before the procedure? Wear comfortable clothing and remove any jewelry, glasses, dentures, and hearing aids. Follow instructions from your health care provider about eating and drinking. This may include: For 12 hours before the procedure -- avoid caffeine. This includes tea, coffee, soda, energy drinks, and diet pills. Drink plenty of water or other fluids that do not have caffeine in them. Being well hydrated can prevent complications. For 4-6 hours before the procedure -- stop eating and drinking. The contrast dye can cause nausea, but this is less likely if your stomach is empty. Ask your health care provider about changing or stopping your regular medicines. This is especially important if you are taking diabetes medicines, blood thinners, or medicines to treat problems with erections (erectile dysfunction). What happens during the procedure?  Hair on your chest may need to be removed so that small sticky patches called electrodes can be placed on your chest. These will transmit information that helps to monitor your heart during the procedure. An IV will be inserted into one of your veins. You might be given a medicine to control your heart rate during the procedure. This will help to ensure that good images are obtained. You will be asked to lie on an exam table. This table will slide in and out of the CT machine during the procedure. Contrast dye will be injected into the IV. You might feel warm, or you may get a metallic taste in your mouth. You will be given a medicine called nitroglycerin. This will relax or dilate the arteries in your heart. The table that you are lying on will move into  the CT machine tunnel for the scan. The person running the machine will give you instructions while the scans are being done. You may be asked to: Keep your arms above your head. Hold your breath. Stay very still, even if the table is moving. When the scanning is complete, you will be moved out of  the machine. The IV will be removed. The procedure may vary among health care providers and hospitals. What can I expect after the procedure? After your procedure, it is common to have: A metallic taste in your mouth from the contrast dye. A feeling of warmth. A headache from the nitroglycerin. Follow these instructions at home: Take over-the-counter and prescription medicines only as told by your health care provider. If you are told, drink enough fluid to keep your urine pale yellow. This will help to flush the contrast dye out of your body. Most people can return to their normal activities right after the procedure. Ask your health care provider what activities are safe for you. It is up to you to get the results of your procedure. Ask your health care provider, or the department that is doing the procedure, when your results will be ready. Keep all follow-up visits as told by your health care provider. This is important. Contact a health care provider if: You have any symptoms of allergy to the contrast dye. These include: Shortness of breath. Rash or hives. A racing heartbeat. Summary A cardiac CT angiogram is a procedure to look at the heart and the area around the heart. It may be done to help find the cause of chest pains or other symptoms of heart disease. During this procedure, a large X-ray machine, called a CT scanner, takes detailed pictures of the heart and the surrounding area after a contrast dye has been injected into blood vessels in the area. Ask your health care provider about changing or stopping your regular medicines before the procedure. This is especially important if you  are taking diabetes medicines, blood thinners, or medicines to treat erectile dysfunction. If you are told, drink enough fluid to keep your urine pale yellow. This will help to flush the contrast dye out of your body. This information is not intended to replace advice given to you by your health care provider. Make sure you discuss any questions you have with your health care provider. Document Revised: 01/10/2021 Document Reviewed: 12/23/2018 Elsevier Patient Education  2022 [employer].

## 2021-04-12 ENCOUNTER — Encounter: Payer: Self-pay | Admitting: Cardiology

## 2021-04-12 DIAGNOSIS — E876 Hypokalemia: Secondary | ICD-10-CM | POA: Insufficient documentation

## 2021-04-12 LAB — BASIC METABOLIC PANEL
BUN/Creatinine Ratio: 12 (ref 12–28)
BUN: 11 mg/dL (ref 8–27)
CO2: 24 mmol/L (ref 20–29)
Calcium: 9.1 mg/dL (ref 8.7–10.3)
Chloride: 98 mmol/L (ref 96–106)
Creatinine, Ser: 0.9 mg/dL (ref 0.57–1.00)
Glucose: 114 mg/dL — ABNORMAL HIGH (ref 70–99)
Potassium: 4.4 mmol/L (ref 3.5–5.2)
Sodium: 134 mmol/L (ref 134–144)
eGFR: 68 mL/min/{1.73_m2} (ref >59)

## 2021-04-12 NOTE — Assessment & Plan Note (Signed)
She had hypokalemia in the setting of her A. fib, interesting.  We can reassess her chemistry panel today prior to CTA.  May require supplementation.  Need to monitor with her being on HCTZ.

## 2021-04-12 NOTE — Assessment & Plan Note (Signed)
Most recent check shows 85 as LDL.  This is on standing dose of Lipitor 10 mg daily.  Relatively well controlled based on known risk factors.  Now with new onset A. fib, we are going to assess for ischemia with a coronary CTA.  This will also provide coronary calcium score.  This will allow for restratification.  We may need to target a lower LDL level.  Anticipate adjusting based on coronary CTA results.

## 2021-04-12 NOTE — Assessment & Plan Note (Addendum)
New diagnosis of PAF -presented with RVR.  Thankfully, she did not have any significant CHF or chest discomfort symptoms went her RVR.  She just simply felt her heart racing which made her feel tired.  She spontaneously converted to sinus rhythm with rate control therapy in the ER.  Was discharged on metoprolol which we will refill.  (25 mg twice daily.  She was not started on DOAC which we will do. ->  Female age 75 with hypertension diabetes and aortic/coronary plaque.  Chads Vascor 5.  Plan: Start Eliquis 5 mg twice daily  For now continue metoprolol 20 mg twice daily-refill.  With significant cardiac risk factors of hypertension hyperlipidemia and evidence of coronary calcification, need to exclude an ischemic etiology new-onset A. Fib. => We will check Coronary CT Angiogram with Possible FFRCT.  Previously normal echocardiogram.  Now with new onset A. fib we will recheck echo just to ensure no change.

## 2021-04-12 NOTE — Assessment & Plan Note (Signed)
Cardiac risk factors include hypertension, hyperlipidemia and diabetes now with A. fib.  She has coronary calcification seen on CT scan and multivessel's.  As such, ischemic evaluation is warranted.  More definitive test would be a coronary CT angiogram versus Myoview.   This will quantitation and qualification of coronary artery calcification and atherosclerosis plaque.  FFRCT allows for physiologic assessment of anatomic lesions.

## 2021-04-12 NOTE — Assessment & Plan Note (Signed)
New onset A. fib diagnosed during ER visit.  Lasted least 2 hours plus, resolved spontaneously with beta-blocker.   Plan:  Ischemic evaluation with CORONARY CTA-FFRCT  Check 2D echo  Start Eliquis 5 mg twice daily  Refill metoprolol 25 mg twice daily

## 2021-04-12 NOTE — Assessment & Plan Note (Signed)
Blood pressure is elevated pretty significantly today.  She is on multiple medications including HCTZ and losartan along with metoprolol.  We can refill metoprolol, reassess pressures and follow-up.  If still elevated, anticipate increasing ARB to 100 mg daily.

## 2021-04-16 ENCOUNTER — Other Ambulatory Visit: Payer: Self-pay

## 2021-04-16 ENCOUNTER — Encounter: Payer: Self-pay | Admitting: Internal Medicine

## 2021-04-16 ENCOUNTER — Telehealth (HOSPITAL_COMMUNITY): Payer: Self-pay | Admitting: Emergency Medicine

## 2021-04-16 ENCOUNTER — Ambulatory Visit (INDEPENDENT_AMBULATORY_CARE_PROVIDER_SITE_OTHER): Payer: PPO | Admitting: Internal Medicine

## 2021-04-16 VITALS — BP 146/70 | HR 61 | Temp 96.5°F | Ht 64.5 in | Wt 205.0 lb

## 2021-04-16 DIAGNOSIS — I480 Paroxysmal atrial fibrillation: Secondary | ICD-10-CM

## 2021-04-16 DIAGNOSIS — E1169 Type 2 diabetes mellitus with other specified complication: Secondary | ICD-10-CM | POA: Diagnosis not present

## 2021-04-16 DIAGNOSIS — E1140 Type 2 diabetes mellitus with diabetic neuropathy, unspecified: Secondary | ICD-10-CM | POA: Diagnosis not present

## 2021-04-16 DIAGNOSIS — R1319 Other dysphagia: Secondary | ICD-10-CM | POA: Diagnosis not present

## 2021-04-16 DIAGNOSIS — E785 Hyperlipidemia, unspecified: Secondary | ICD-10-CM | POA: Diagnosis not present

## 2021-04-16 DIAGNOSIS — I10 Essential (primary) hypertension: Secondary | ICD-10-CM

## 2021-04-16 DIAGNOSIS — D6859 Other primary thrombophilia: Secondary | ICD-10-CM

## 2021-04-16 DIAGNOSIS — D6869 Other thrombophilia: Secondary | ICD-10-CM | POA: Insufficient documentation

## 2021-04-16 NOTE — Assessment & Plan Note (Signed)
Well controlled on current regimen. Renal function stable, no changes today.  Lab Results  Component Value Date   CREATININE 0.90 04/11/2021   Lab Results  Component Value Date   NA 134 04/11/2021   K 4.4 04/11/2021   CL 98 04/11/2021   CO2 24 04/11/2021

## 2021-04-16 NOTE — Progress Notes (Signed)
Subjective:  Patient ID: Kari [name], female    DOB: 01-14-1946  Age: 75 y.o. MRN: [id number]  CC: The primary encounter diagnosis was Hyperlipidemia associated with type 2 diabetes mellitus (Kari [name]). Diagnoses of Esophageal dysphagia, Type 2 diabetes mellitus with diabetic neuropathy, without long-term current use of insulin (Kari [name]), Essential hypertension, Thrombophilia (Kari [name]), and Paroxysmal atrial fibrillation with rapid ventricular response (Kari [name]); CHA2DS2-VASc score 5 (HTN, DM-2, Age, Female, Aortic Plaque) were also pertinent to this visit.  HPI Kari [name] presents for  Er follow up Chief Complaint  Patient presents with   Follow-up    ED follow up on A-Fib.    This visit occurred during the SARS-CoV-2 public health emergency.  Safety protocols were in place, including screening questions prior to the visit, additional usage of staff PPE, and extensive cleaning of exam room while observing appropriate contact time as indicated for disinfecting solutions.   75 yr old female with diet controlled type 2 DM, morbid obesity, aortic atherosclerosis and asthma presents after being treated in ER for atrial  fibrillation on  Nov 28 . When she presented with palpitations without chest pain .   Saw cardiology last week and Eliquis prescribed,  ECHO done . Has additional studies ordered by Ellyn Hack No prior sleep study,  but sleeping better with 1/2 tablet trazodone. Feels tired, has dry mouth and constipation  aggravated by the metoprolol.  Tolerating the eliqiuis  Recent prednisone tapered to off in February was given to her prior to the onset of paroxysmal  atrial fib,  caused insomnia x 3-4 hours . Weight loss of 35 lbs since stopping prednisone this past February Kari [name]) .  Still losing. 8 lbs in the past month   Lymphedema: managed with leg elevation  no pumping needed per schnier.   New onset asymptomatic umbilical hernia.  Does not want to see a surgeon   Had a case of thrush in early  November   caused by use of cough drops which she uses to manage the nocturnal cough .  Use to be distressed by the symptoms .  Choking on water and solids for the past 6 months , not occurring every meal  if she is careful   taking 40 mg nexium daily for years    Lab Results  Component Value Date   HGBA1C 5.8 (A) 09/08/2020        Outpatient Medications Prior to Visit  Medication Sig Dispense Refill   albuterol (VENTOLIN HFA) 108 (90 Base) MCG/ACT inhaler Inhale 2 puffs by mouth into the lungs every 6 hours as needed for wheezing or shortness of breath 8.5 g 0   allopurinol (ZYLOPRIM) 300 MG tablet Take 1 tablet (300 mg total) by mouth daily. 90 tablet 0   apixaban (ELIQUIS) 5 MG TABS tablet Take 1 tablet (5 mg total) by mouth 2 (two) times daily. 60 tablet 3   atorvastatin (LIPITOR) 10 MG tablet Take 1 tablet (10 mg total) by mouth daily. 90 tablet 0   cetirizine (ZYRTEC) 10 MG tablet Take 10 mg by mouth daily.     colchicine 0.6 MG tablet Take 1 tablet (0.6 mg total) by mouth 2 (two) times daily. As needed for gout flare. 60 tablet 2   diflorasone (PSORCON) 0.05 % cream Apply 1 application topically 2 (two) times daily. 30 g 5   esomeprazole (NEXIUM) 20 MG capsule Take 20 mg by mouth daily at 12 noon.     fluticasone-salmeterol (ADVAIR DISKUS) 250-50 MCG/ACT AEPB TAKE 1 PUFF  BY MOUTH TWICE A DAY 60 each 3   hydrochlorothiazide (HYDRODIURIL) 25 MG tablet Take 1 tablet (25 mg total) by mouth daily. 90 tablet 0   losartan (COZAAR) 50 MG tablet Take 1 tablet (50 mg total) by mouth at bedtime. 90 tablet 0   metoprolol tartrate (LOPRESSOR) 25 MG tablet Take 1 tablet (25 mg total) by mouth 2 (two) times daily. 180 tablet 1   mirtazapine (REMERON) 45 MG tablet Take 1 tablet (45 mg total) by mouth at bedtime. 90 tablet 1   montelukast (SINGULAIR) 10 MG tablet Take 1 tablet (10 mg total) by mouth at bedtime. 90 tablet 0   OVER THE COUNTER MEDICATION Nasocort nasal spray     Probiotic Product  (PROBIOTIC PO) Take by mouth.     traZODone (DESYREL) 50 MG tablet Take 0.5-1 tablets (25-50 mg total) by mouth at bedtime as needed for sleep. 90 tablet 1   triamcinolone lotion (KENALOG) 0.1 % Apply to affected area(s) twice a day 180 mL 0   FLUZONE HIGH-DOSE QUADRIVALENT 0.7 ML SUSY      benzonatate (TESSALON) 200 MG capsule Take 1 capsule (200 mg total) by mouth 3 (three) times daily as needed for cough. (Patient not taking: Reported on 04/16/2021) 60 capsule 1   furosemide (LASIX) 20 MG tablet TAKE 1 TABLET BY MOUTH EVERY OTHER DAY (Patient not taking: Reported on 04/16/2021) 45 tablet 3   Facility-Administered Medications Prior to Visit  Medication Dose Route Frequency Provider Last Rate Last Admin   0.9 %  sodium chloride infusion  500 mL Intravenous Once Gatha Mayer, MD        Review of Systems;  Patient denies headache, fevers, malaise, unintentional weight loss, skin rash, eye pain, sinus congestion and sinus pain, sore throat, dysphagia,  hemoptysis , cough, dyspnea, wheezing, chest pain, palpitations, orthopnea, edema, abdominal pain, nausea, melena, diarrhea, constipation, flank pain, dysuria, hematuria, urinary  Frequency, nocturia, numbness, tingling, seizures,  Focal weakness, Loss of consciousness,  Tremor, insomnia, depression, anxiety, and suicidal ideation.      Objective:  BP (!) 146/70 (BP Location: Left Arm, Patient Position: Sitting, Cuff Size: Large)   Pulse 61   Temp (!) 96.5 F (35.8 C) (Temporal)   Ht 5' 4.5" (1.638 m)   Wt 205 lb (93 kg)   SpO2 97%   BMI 34.64 kg/m   BP Readings from Last 3 Encounters:  04/16/21 (!) 146/70  04/11/21 (!) 150/78  04/09/21 123/66    Wt Readings from Last 3 Encounters:  04/16/21 205 lb (93 kg)  04/11/21 209 lb 4 oz (94.9 kg)  04/09/21 210 lb (95.3 kg)    General appearance: alert, cooperative and appears stated age Ears: normal TM's and external ear canals both ears Throat: lips, mucosa, and tongue normal; teeth  and gums normal Neck: no adenopathy, no carotid bruit, supple, symmetrical, trachea midline and thyroid not enlarged, symmetric, no tenderness/mass/nodules Back: symmetric, no curvature. ROM normal. No CVA tenderness. Lungs: clear to auscultation bilaterally Heart: regular rate and rhythm, S1, S2 normal, no murmur, click, rub or gallop Abdomen: soft, non-tender; bowel sounds normal; no masses,  no organomegaly Pulses: 2+ and symmetric Skin: Skin color, texture, turgor normal. No rashes or lesions Lymph nodes: Cervical, supraclavicular, and axillary nodes normal.  Lab Results  Component Value Date   HGBA1C 5.8 (A) 09/08/2020   HGBA1C 6.1 02/18/2019   HGBA1C 5.8 04/16/2018    Lab Results  Component Value Date   CREATININE 0.90 04/11/2021  CREATININE 0.74 04/09/2021   CREATININE 0.95 (H) 09/08/2020    Lab Results  Component Value Date   WBC 8.2 04/09/2021   HGB 11.9 (L) 04/09/2021   HCT 34.3 (L) 04/09/2021   PLT 264 04/09/2021   GLUCOSE 114 (H) 04/11/2021   CHOL 166 09/08/2020   TRIG 337 (H) 09/08/2020   HDL 40 (L) 09/08/2020   LDLDIRECT 92.0 02/18/2019   LDLCALC 85 09/08/2020   ALT 35 (H) 09/08/2020   AST 41 (H) 09/08/2020   NA 134 04/11/2021   K 4.4 04/11/2021   CL 98 04/11/2021   CREATININE 0.90 04/11/2021   BUN 11 04/11/2021   CO2 24 04/11/2021   TSH 1.900 04/09/2021   HGBA1C 5.8 (A) 09/08/2020   MICROALBUR <0.7 06/10/2018    DG Chest 2 View  Result Date: 04/09/2021 CLINICAL DATA:  Heart palpitations. EXAM: CHEST - 2 VIEW COMPARISON:  CT angiogram chest 09/13/2019. Chest radiographs 09/13/2019. FINDINGS: Heart size within normal limits. Aortic atherosclerosis. No appreciable airspace consolidation or frank pulmonary edema. No evidence of pleural effusion or pneumothorax. No acute bony abnormality identified. Degenerative changes of the spine. IMPRESSION: No evidence of acute cardiopulmonary abnormality. A 7 mm right middle lobe pulmonary nodule was noted on the  prior chest CT of 09/13/2019. Please refer to this prior report for further description and for CT chest follow-up recommendations. Aortic Atherosclerosis (ICD10-I70.0). Electronically Signed   By: Kellie Simmering D.O.   On: 04/09/2021 09:49    Assessment & Plan:   Problem List Items Addressed This Visit     Essential hypertension (Chronic)    Well controlled on current regimen. Renal function stable, no changes today.  Lab Results  Component Value Date   CREATININE 0.90 04/11/2021   Lab Results  Component Value Date   NA 134 04/11/2021   K 4.4 04/11/2021   CL 98 04/11/2021   CO2 24 04/11/2021          Hyperlipidemia associated with type 2 diabetes mellitus ([place]) - Primary (Chronic)   Relevant Orders   Hemoglobin A1c   Microalbumin / creatinine urine ratio   Hepatic function panel   Paroxysmal atrial fibrillation with rapid ventricular response (Kari [name]); CHA2DS2-VASc score 5 (HTN, DM-2, Age, Female, Aortic Plaque) (Chronic)    Managed with metoprolol and Eliquis by Dr Ellyn Hack . Etiology unclear.  May need sleep study       Diabetes mellitus with neuropathy ([name])    Historically  well-controlled on diet alone .  hemoglobin A1c has been consistently at or  less than 7.0 . Patient is up-to-date on eye exams and foot exam has been done today and is normal .  She continues to report mild numbness without skin changes  today. Patient has no microalbuminuria. Patient is tolerating statin therapy for CAD risk reduction and on ACE/ARB for renal protection and hypertension   Lab Results  Component Value Date   HGBA1C 5.8 (A) 09/08/2020   Lab Results  Component Value Date   MICROALBUR <0.7 06/10/2018   MICROALBUR <0.7 07/04/2016          Thrombophilia ([place])    Acquired,  Secondary to PAF.  Risk mitigated with Eliquis       Other Visit Diagnoses     Esophageal dysphagia       Relevant Orders   DG ESOPHAGUS W SINGLE CM (SOL OR THIN BA)       I have discontinued Murray Hodgkins  Gethers's Fluzone High-Dose Quadrivalent. I am also having her maintain  her cetirizine, OVER THE COUNTER MEDICATION, esomeprazole, Probiotic Product (PROBIOTIC PO), diflorasone, colchicine, triamcinolone lotion, benzonatate, albuterol, traZODone, mirtazapine, furosemide, allopurinol, atorvastatin, hydrochlorothiazide, losartan, montelukast, fluticasone-salmeterol, apixaban, and metoprolol tartrate. We will continue to administer sodium chloride.  No orders of the defined types were placed in this encounter.   Medications Discontinued During This Encounter  Medication Reason   FLUZONE HIGH-DOSE QUADRIVALENT 0.7 ML SUSY     Follow-up: No follow-ups on file.   [name], MD

## 2021-04-16 NOTE — Patient Instructions (Addendum)
  continue to monitor  Your weight daily and take a dose of furosemide only if your overnight weight gain is 2 lbs,  or your weekly weekly weight gain is 5 lbs.    Your swallowing difficulty may be caused by a stricture in your esophagus .  I have ordered a :barium swallow"  to investigate this more

## 2021-04-16 NOTE — Assessment & Plan Note (Signed)
Acquired,  Secondary to PAF.  Risk mitigated with Eliquis

## 2021-04-16 NOTE — Assessment & Plan Note (Signed)
Historically  well-controlled on diet alone .  hemoglobin A1c has been consistently at or  less than 7.0 . Patient is up-to-date on eye exams and foot exam has been done today and is normal .  She continues to report mild numbness without skin changes  today. Patient has no microalbuminuria. Patient is tolerating statin therapy for CAD risk reduction and on ACE/ARB for renal protection and hypertension   Lab Results  Component Value Date   HGBA1C 5.8 (A) 09/08/2020   Lab Results  Component Value Date   MICROALBUR <0.7 06/10/2018   MICROALBUR <0.7 07/04/2016

## 2021-04-16 NOTE — Assessment & Plan Note (Addendum)
Managed with metoprolol and Eliquis by Dr Ellyn Hack . Etiology unclear.  May need sleep study

## 2021-04-17 LAB — MICROALBUMIN / CREATININE URINE RATIO
Creatinine,U: 54 mg/dL
Microalb Creat Ratio: 1.3 mg/g (ref 0.0–30.0)
Microalb, Ur: <0.7 mg/dL (ref 0.0–1.9)

## 2021-04-17 LAB — HEMOGLOBIN A1C: Hgb A1c MFr Bld: 5.8 % (ref 4.6–6.5)

## 2021-04-17 NOTE — Telephone Encounter (Signed)
Reaching out to patient to offer assistance regarding upcoming cardiac imaging study; pt verbalizes understanding of appt date/time, parking situation and where to check in, pre-test NPO status and medications ordered, and verified current allergies; name and call back number provided for further questions should they arise Marchia Bond RN Navigator Cardiac Imaging [facility] [phone] office [phone] cell  Denies iv issues Holding diuretics, antihistamines Arrival 10a

## 2021-04-19 ENCOUNTER — Other Ambulatory Visit: Payer: Self-pay

## 2021-04-19 ENCOUNTER — Other Ambulatory Visit: Payer: Self-pay | Admitting: Cardiology

## 2021-04-19 ENCOUNTER — Ambulatory Visit
Admission: RE | Admit: 2021-04-19 | Discharge: 2021-04-19 | Disposition: A | Discharge status: Home / Self Care | Payer: PPO | Source: Ambulatory Visit | Attending: Cardiology | Admitting: Cardiology

## 2021-04-19 DIAGNOSIS — R931 Abnormal findings on diagnostic imaging of heart and coronary circulation: Secondary | ICD-10-CM | POA: Diagnosis not present

## 2021-04-19 DIAGNOSIS — I2510 Atherosclerotic heart disease of native coronary artery without angina pectoris: Secondary | ICD-10-CM | POA: Diagnosis not present

## 2021-04-19 MED ORDER — IOHEXOL 350 MG/ML SOLN
100.0000 mL | Freq: Once | INTRAVENOUS | Status: AC | PRN
  Administered 2021-04-19: 100 mL via INTRAVENOUS

## 2021-04-19 MED ORDER — METOPROLOL TARTRATE 5 MG/5ML IV SOLN: 5.0000 mg | INTRAVENOUS | Status: DC | PRN

## 2021-04-19 MED ORDER — METOPROLOL TARTRATE 5 MG/5ML IV SOLN
10.0000 mg | Freq: Once | INTRAVENOUS | Status: AC
  Administered 2021-04-19: 10 mg via INTRAVENOUS

## 2021-04-19 MED ORDER — NITROGLYCERIN 0.4 MG SL SUBL
0.8000 mg | SUBLINGUAL_TABLET | Freq: Once | SUBLINGUAL | Status: AC
  Administered 2021-04-19: 0.8 mg via SUBLINGUAL

## 2021-04-19 NOTE — Progress Notes (Signed)
Patient tolerated procedure well. Ambulate w/o difficulty. Denies light headedness or being dizzy. Sitting in chair drinking water provided. Encouraged to drink extra water today and reasoning explained. Verbalized understanding. All questions answered. ABC intact. No further needs. Discharge from procedure area w/o issues.   °

## 2021-04-20 DIAGNOSIS — I2510 Atherosclerotic heart disease of native coronary artery without angina pectoris: Secondary | ICD-10-CM | POA: Diagnosis not present

## 2021-04-22 NOTE — Assessment & Plan Note (Signed)
Greater than 70% stenosis of the proximal RCA was noted on the coronary CT .  Cardiac catheterization is recommended

## 2021-04-23 ENCOUNTER — Telehealth: Payer: Self-pay | Admitting: *Deleted

## 2021-04-23 NOTE — Telephone Encounter (Signed)
Patient is returning your call.  

## 2021-04-23 NOTE — Telephone Encounter (Addendum)
Coronary CTA results:   Coronary Calcium Score elevated at 289.  There is potential moderate to severe cholesterol plaque narrowing in the left anterior descending artery.  This can be evaluated with FFR (equivalent of a stress test). - results pending  There is also notable plaque in the proximal right coronary artery.   The reading physician has recommended that we discussed evaluation with cardiac catheterization.   Would like to get her in to be seen either by me or APP soon to discuss recommendation for cardiac catheterization and scheduling it.  I only have 1 more [facility] Cath Lab Day before Christmas vacation.  May have to have one of the other 2 MDs cath, if I cannot.    Jennette Dubin, MD  [name], MD; P [facility] Triage; Theora Gianotti, NP Addendum to result note: CT FFR did show positive finding on the RCA but not LAD.   With new onset A. fib, need to exclude ischemic RCA disease.  Somewhat complicated by the fact that she has been started on DOAC.  Would be to be seen to discuss cardiac catheterization.  She is not scheduled to see me back until mid January.  Would probably like to have her seen sooner.  If not by me, by APP--can be cathed by either me or renal Dr. Sophronia Simas or Dr. Saunders Revel.   Glenetta Hew, MD    Attempted to call pt. No answer. Lmtcb.  Please note there are two result notes.

## 2021-04-23 NOTE — Telephone Encounter (Signed)
-----   Message from Leonie Man, MD sent at 04/21/2021  9:53 PM EST ----- Addendum to result note: CT FFR did show positive finding on the RCA but not LAD.  With new onset A. fib, need to exclude ischemic RCA disease.  Somewhat complicated by the fact that she has been started on DOAC.  Would be to be seen to discuss cardiac catheterization. She is not scheduled to see me back until mid January.  Would probably like to have her seen sooner.  If not by me, by APP--can be cathed by either me or renal Dr. Sophronia Simas or Dr. Saunders Revel.  Glenetta Hew, MD

## 2021-04-23 NOTE — Telephone Encounter (Signed)
Called and spoke with pt.  Notified of results and Dr. Allison Quarry recc below.  Pt voiced understanding.  Offered appointment tomorrow with Christell Faith, PA and pt refused d/t scheduling conflict/short notice.  No other openings with APP or Dr. Ellyn Hack prior to pt's scheduled follow up of 05/24/21.  Pt states she would prefer to keep her scheduled appt because she would have to schedule time off from work for procedure as well as office appointment.  Pt has no further questions at this time.

## 2021-04-24 NOTE — Telephone Encounter (Signed)
Patient changed her mind and would like to schedule an appt sooner .  Scheduled next available in January and added to waitlist

## 2021-04-25 ENCOUNTER — Encounter: Payer: Self-pay | Admitting: Cardiology

## 2021-04-25 NOTE — Progress Notes (Signed)
Primary Care Provider: [name], MD [facility] Cardiologist: None Electrophysiologist: None Vascular Surgeon: Kari Cabal, MD Pulmonologist: Dr. Raul Del [facility])  Clinic Note: Chief Complaint  Patient presents with   Follow-up    F/u CT results no complaints today. Meds reviewed verbally with pt.   Coronary Artery Disease    Abnormal cardiac CTA   Atrial Fibrillation    No recurrent symptoms.  No bleeding on Eliquis.    ===================================  ASSESSMENT/PLAN   Problem List Items Addressed This Visit       Cardiology Problems   Paroxysmal atrial fibrillation with rapid ventricular response (HCC); CHA2DS2-VASc score 5 (HTN, DM-2, Age, Female, Aortic Plaque) (Chronic)    No recurrent symptoms of A. fib.  She definitely Kari [name], fast, but had no other symptoms.  Plan: Continue current dose of metoprolol. Tolerating Eliquis.  Consider sleep study, but potential culprit being RCA lesion CAD.      Relevant Medications   sodium chloride flush (NS) 0.9 % injection 3 mL (Start on 04/26/2021 10:00 PM)   Hyperlipidemia associated with type 2 diabetes mellitus (HCC) (Chronic)    With evidence of CAD, with target LDL now less than 70 if not less than 55.  Anticipate increasing statin dose. We will have Kari [name] send off Kari [name] from Cath Lab  -> also increase to 40 mg Atorvastatin if Cath confirms CTA.       Essential hypertension (Chronic)    Blood pressure looks great on current dose of metoprolol and losartan.        Other   Abnormal cardiac CT angiography - Primary    FFR positive proximal RCA lesion.  Noncalcified plaque.  I think her main symptom is probably the fatigue and perhaps this could have been the trigger for her A. Fib.  We talked about the risks, benefits alternatives and indications of heart catheterization versus medical management.  Based on the fact that she is symptomatic with fatigue, I  recommended cardiac catheterization.  With the holidays upcoming, we discussed scheduling the procedure to get it done before Christmas.  This does mean that it would not be performed by me.  We have tentatively scheduled her for Monday morning 04/30/2021 with Kari [name].  See informed consent note below.      Relevant Medications   sodium chloride flush (NS) 0.9 % injection 3 mL (Start on 04/26/2021 10:00 PM)   ===================================  HPI:    Kari [name] is a Morbidly Obese 75 y.o. female with PMH notable for DM-2 (with diabetic neuropathy - not on insulin), HLD, Vit D deficiency, COPD/(Moderate Persistent Asthma with Recurrent Bronchitis; complicated by Restrictive Lung Disease from Obesity-OHS, likely OSA), GERD & Bilateral LE Lynmphedema / Venous Stasis Edema, Gouty Arthritis & recent diagnosis of Paroxysmal Atrial Fibrillation who is being seen today for follow-up evaluation to discuss test results.  She was initially sees as an ER follow-up at the request of Kari [name]  Recent Hospitalizations:  04/09/2021-[facility] ER: Presented with heart racing beginning at 0748.  Was constant.  No chest pain or dyspnea associated with it.  Previous episodes have not lasted more than a minute or 2.  This episode lasting 2-1/2 hours plus. => Noted to be in A. fib RVR-rates in the 140s. => Also noted to have hypokalemia.  Started on diltiazem drip and hydrated with IV fluids.  Spontaneously converted to sinus rhythm rate 74 bpm.  Kari [name] was Recently seen on 04/11/2021 ->  she was in NSR with 1  AVB.  Has not had any further episodes of fast heart rate since leaving hospital.  Actually indicated that heart rate settled down prior to arrival to the ER.  This was not accompanied by chest pain or pressure, simply just tachycardia.  She did feel little lightheaded but no syncope or near syncope.  She may have noted previous episodes lasting much less time with less notable symptoms.   Relatively active at baseline.  She noted about a 40 pound weight loss over the last several months after stopping steroid therapy for arthritis cath t.  Dramatically improved edema.  Taking furosemide..  Reviewed  CV studies:    The following studies were reviewed today: (if available, images/films reviewed: From Epic Chart or Care Everywhere) TTE 05/04/2021 - Pending.  Coronary CT Angiography 04/19/2021:  Coronary Calcium Score 389. Domninant RCA-RDAP<PLA  - prox RCA non-calcified > 70% stenosis.  Moderate proximal LAD (50-69%). Minimal LCx. FFRct - LAD & LCx - No significant stenoses.  Prox RCA Significant FFRct 0.65% --> RECOMMEND CARDIAC CATHETERIZATION.  PA enlargement suggests PA HTN.  R ML Pulmonary Nodule stable c/w 09/13/2019. Aortic Atherosclerosis.  Interval History:   Kari [name] presents here today essentially for hospital follow-up after recent diagnosis of A. fib.  After R FFR recently, she really has no chest pain or pressure at rest or exertion.  No sensation of irregular heartbeats palpitations.  She has not had any further episodes like the episode that took her to the ER.  No significant edema but when she does note is that she has been significant fatigue for the last month or 2.  She is not able to do nearly the amount of activity she was hoping to be able to do.  She was hoping that being off of the prednisone, but she was can be able to get back and exercise, lose weight, and she has not had any energy. She denies any PND, orthopnea with mild edema.  CV Review of Symptoms (Summary) Cardiovascular ROS: no chest pain or dyspnea on exertion positive for - -generalized fatigue, exercise intolerance.  No energy.  Edema well controlled. negative for - edema, irregular heartbeat, orthopnea, palpitations, paroxysmal nocturnal dyspnea, rapid heart rate, shortness of breath, or lightheadedness, dizziness or wooziness, syncope/near syncope or TIA/ amaurosis fugax,  claudication  REVIEWED OF SYSTEMS   Review of Systems  Constitutional:  Positive for malaise/fatigue (She did not mention this last time, but was having to think back about any potential symptoms and acknowledges that she has been fatigued over the last 2 months.) and weight loss (Stable weight now.).  HENT:  Negative for nosebleeds.   Respiratory:  Negative for cough, shortness of breath and wheezing.   Cardiovascular:        Per HPI  Gastrointestinal:  Negative for blood in stool and melena.  Genitourinary:  Negative for flank pain and hematuria.  Musculoskeletal:  Positive for joint pain (Not really having any active arthritis pains.). Negative for back pain.  Neurological:  Negative for dizziness and focal weakness.  Psychiatric/Behavioral:  Negative for depression and memory loss. The patient is nervous/anxious (Has been anxious since hearing the results of her study.). The patient does not have insomnia.    I have reviewed and (if needed) personally updated the patient's problem list, medications, allergies, past medical and surgical history, social and family history.   PAST MEDICAL HISTORY   Past Medical History:  Diagnosis Date   Allergy  to cat, dogs, pollen, dust, cigarette smoke   Arthritis    feet   Cataract    removed both eyes   COPD (chronic obstructive pulmonary disease) with chronic bronchitis (HCC)    And moderate persistent asthma; complicated by Restrictive Lung Disease from obesity   Coronary artery disease involving native coronary artery of native heart with other form of angina pectoris ([place]) 04/20/2019   (Evaluation for new onset A. fib) Coronary Calcium Score 389. Domninant RCA-RDAP<PLA  - prox RCA non-calcified > 70% stenosis.  Moderate proximal LAD (50-69%). Minimal LCx.  FFRCT of LAD and LCx not significant. Prox RCA Significant FFRct 0.65% --> RECOMMEND CARDIAC CATHETERIZATION.   Depression    GERD (gastroesophageal reflux disease)    Hx of  adenomatous colonic polyps 05/30/2010   Hyperlipidemia    Hypertension    Lymphedema of both lower extremities    No superficial venous reflux noted.  However right-sided saphenofemoral junction reflux noted and left common femoral vein reflux noted.  No DVT or superficial thrombus.   Menopause    Migraines    Miscarriage    X 1   Moderate persistent asthma    PONV (postoperative nausea and vomiting)     PAST SURGICAL HISTORY   Past Surgical History:  Procedure Laterality Date   CATARACT EXTRACTION, BILATERAL     CESAREAN SECTION     X 2   COLONOSCOPY     DENTAL SURGERY  09/2016   POLYPECTOMY     TRANSTHORACIC ECHOCARDIOGRAM  11/04/2019   EF 55 to 60%.  Normal LV size and function.  GR 1 DD.  Unable to assess.  Mild LA dilation.  Normal aortic and mitral valve.  Normal RVP.  (Normal study)   TUBAL LIGATION  1980   Lower Extremity Venous Dopplers 01/11/2021:  - Venous reflux is noted in the right sapheno-femoral junction. Left: - No evidence of superficial venous reflux seen in the left greater saphenous vein. - No evidence of superficial venous reflux seen in the left short saphenous vein. - Venous reflux is noted in the left common femoral vein.  Immunization History  Administered Date(s) Administered   Fluad Quad(high Dose 65+) 02/28/2020   Influenza, High Dose Seasonal PF 02/24/2018, 03/15/2019   Influenza,inj,Quad PF,6+ Mos 03/03/2013   Influenza-Unspecified 01/11/2014, 02/13/2015, 02/27/2016, 03/04/2017, 02/25/2021   Moderna Covid-19 Vaccine Bivalent Booster 45yrs & up 02/25/2021   PFIZER Comirnaty(Gray Top)Covid-19 Tri-Sucrose Vaccine 11/09/2020   PFIZER(Purple Top)SARS-COV-2 Vaccination 07/02/2019, 07/23/2019, 02/28/2020   Pneumococcal Conjugate-13 03/03/2013   Pneumococcal Polysaccharide-23 08/14/2015   Tdap 03/03/2008    MEDICATIONS/ALLERGIES   Current Meds  Medication Sig   albuterol (VENTOLIN HFA) 108 (90 Base) MCG/ACT inhaler Inhale 2 puffs by mouth into  the lungs every 6 hours as needed for wheezing or shortness of breath   allopurinol (ZYLOPRIM) 300 MG tablet Take 1 tablet (300 mg total) by mouth daily.   apixaban (ELIQUIS) 5 MG TABS tablet Take 1 tablet (5 mg total) by mouth 2 (two) times daily.   atorvastatin (LIPITOR) 10 MG tablet Take 1 tablet (10 mg total) by mouth daily.   benzonatate (TESSALON) 200 MG capsule Take 1 capsule (200 mg total) by mouth 3 (three) times daily as needed for cough.   cetirizine (ZYRTEC) 10 MG tablet Take 10 mg by mouth daily.   colchicine 0.6 MG tablet Take 1 tablet (0.6 mg total) by mouth 2 (two) times daily. As needed for gout flare.   diflorasone (PSORCON) 0.05 % cream Apply 1  application topically 2 (two) times daily.   esomeprazole (NEXIUM) 20 MG capsule Take 20 mg by mouth daily at 12 noon.   fluticasone-salmeterol (ADVAIR DISKUS) 250-50 MCG/ACT AEPB TAKE 1 PUFF BY MOUTH TWICE A DAY   furosemide (LASIX) 20 MG tablet TAKE 1 TABLET BY MOUTH EVERY OTHER DAY   hydrochlorothiazide (HYDRODIURIL) 25 MG tablet Take 1 tablet (25 mg total) by mouth daily.   losartan (COZAAR) 50 MG tablet Take 1 tablet (50 mg total) by mouth at bedtime.   metoprolol tartrate (LOPRESSOR) 25 MG tablet Take 1 tablet (25 mg total) by mouth 2 (two) times daily.   mirtazapine (REMERON) 45 MG tablet Take 1 tablet (45 mg total) by mouth at bedtime.   montelukast (SINGULAIR) 10 MG tablet Take 1 tablet (10 mg total) by mouth at bedtime.   OVER THE COUNTER MEDICATION Nasocort nasal spray   Probiotic Product (PROBIOTIC PO) Take by mouth.   traZODone (DESYREL) 50 MG tablet Take 0.5-1 tablets (25-50 mg total) by mouth at bedtime as needed for sleep.   triamcinolone lotion (KENALOG) 0.1 % Apply to affected area(s) twice a day   Current Facility-Administered Medications for the 04/26/21 encounter (Office Visit) with Leonie Man, MD  Medication   0.9 %  sodium chloride infusion   sodium chloride flush (NS) 0.9 % injection 3 mL    Allergies   Allergen Reactions   Nifedipine     REACTION: swollen feet    SOCIAL HISTORY/FAMILY HISTORY   Reviewed in Epic:   Social History   Tobacco Use   Smoking status: Former    Types: Cigarettes    Quit date: 01/02/1971    Years since quitting: 50.3   Smokeless tobacco: Never  Vaping Use   Vaping Use: Never used  Substance Use Topics   Alcohol use: Not Currently    Comment: occasional- wine gives pt headache only drinks once every 6 months   Drug use: No   Social History   Social History Narrative   Not on file   Family History  Problem Relation Age of Onset   Myasthenia gravis Mother    Cervical cancer Mother    Heart disease Father    Coronary artery disease Father    Valvular heart disease Father    Heart attack Father 34   Cancer Sister 106       colon   Colon cancer Sister 31   Heart Problems Sister    Heart Problems Brother    Heart attack Paternal Grandfather        71's   Breast cancer Neg Hx    Esophageal cancer Neg Hx    Colon polyps Neg Hx    Rectal cancer Neg Hx    Stomach cancer Neg Hx     OBJCTIVE -PE, EKG, labs   Wt Readings from Last 3 Encounters:  04/26/21 206 lb 4 oz (93.6 kg)  04/16/21 205 lb (93 kg)  04/11/21 209 lb 4 oz (94.9 kg)    Physical Exam: BP 110/60 (BP Location: Left Arm, Patient Position: Sitting, Cuff Size: Normal)    Pulse (!) 58    Ht 5\' 4"  (1.626 m)    Wt 206 lb 4 oz (93.6 kg)    SpO2 98%    BMI 35.40 kg/m  Physical Exam Vitals reviewed.  Constitutional:      General: She is not in acute distress.    Appearance: Normal appearance. She is obese. She is not ill-appearing.  Comments: Well-groomed.  Healthy-appearing.  HENT:     Head: Normocephalic and atraumatic.  Neck:     Vascular: No carotid bruit or JVD.  Cardiovascular:     Rate and Rhythm: Normal rate and regular rhythm. Occasional Extrasystoles are present.    Chest Wall: PMI is not displaced.     Pulses: Intact distal pulses. No decreased pulses.      Heart sounds: S1 normal and S2 normal. Heart sounds are distant. No murmur heard.   No friction rub. No gallop.  Pulmonary:     Effort: Pulmonary effort is normal. No respiratory distress.     Breath sounds: Normal breath sounds. No wheezing, rhonchi or rales.  Chest:     Chest wall: No tenderness.  Abdominal:     Palpations: Mass: No HSM or bruit.     Comments: Truncal obesity  Musculoskeletal:        General: Signs of injury (Left wrist and splint in place-possible carpal tunnel syndrome symptoms.) present. No swelling.     Cervical back: Normal range of motion and neck supple.  Skin:    General: Skin is warm and dry.     Coloration: Skin is not jaundiced or pale.  Neurological:     General: No focal deficit present.     Mental Status: She is alert and oriented to person, place, and time.     Gait: Gait normal.  Psychiatric:        Mood and Affect: Mood normal.        Behavior: Behavior normal.        Thought Content: Thought content normal.        Judgment: Judgment normal.     Adult ECG Report  Rate: 58;  Rhythm: normal sinus rhythm and  low voltage.  Cannot rule out anterior MI, age-indeterminate. ;   Narrative Interpretation: Borderline EKG. stable.  EKG from ER 04/09/2021:  Rate: 183 ;  Rhythm: atrial fibrillation and rapid ventricular rate with PVCs/aberrantly conducted beats.  Cannot exclude anterior infarction age indeterminate.  Low voltage. ;   Narrative Interpretation: New onset A. fib RVR -> Follow-up EKG showed sinus rhythm, rate 76 bpm.  Recent Labs: Reviewed Lab Results  Component Value Date   CHOL 166 09/08/2020   HDL 40 (L) 09/08/2020   LDLCALC 85 09/08/2020   LDLDIRECT 92.0 02/18/2019   TRIG 337 (H) 09/08/2020   CHOLHDL 4.2 09/08/2020   .  Chemistry panel checked today. Lab Results  Component Value Date   CREATININE 0.90 04/11/2021   BUN 11 04/11/2021   NA 134 04/11/2021   K 4.4 04/11/2021   CL 98 04/11/2021   CO2 24 04/11/2021   CBC Latest  Ref Rng & Units 04/09/2021 09/13/2019 07/23/2017  WBC 4.0 - 10.5 K/uL 8.2 10.2 9.5  Hemoglobin 12.0 - 15.0 g/dL 11.9(L) 12.4 12.9  Hematocrit 36.0 - 46.0 % 34.3(L) 35.2(L) 37.6  Platelets 150 - 400 K/uL 264 213 235.0    Lab Results  Component Value Date   HGBA1C 5.8 04/16/2021   Lab Results  Component Value Date   TSH 1.900 04/09/2021    ==================================================  COVID-19 Education: The signs and symptoms of COVID-19 were discussed with the patient and how to seek care for testing (follow up with PCP or arrange E-visit).    I spent a total of 32 minutes with the patient spent in direct patient consultation.  Additional time spent with chart review  / charting (studies, outside notes, etc): 12 min (12  min pre-charting) Total Time: 56 min  Current medicines are reviewed at length with the patient today.  (+/- concerns) N/A  This visit occurred during the SARS-CoV-2 public health emergency.  Safety protocols were in place, including screening questions prior to the visit, additional usage of staff PPE, and extensive cleaning of exam room while observing appropriate contact time as indicated for disinfecting solutions.  Notice: This dictation was prepared with Dragon dictation along with smart phrase technology. Any transcriptional errors that result from this process are unintentional and may not be corrected upon review.   Studies Ordered:  Orders Placed This Encounter  Procedures   EKG 12-Lead   Shared Decision Making/Informed Consent The risks [stroke (1 in 1000), death (1 in 1000), kidney failure [usually temporary] (1 in 500), bleeding (1 in 200), allergic reaction [possibly serious] (1 in 200)], benefits (diagnostic support and management of coronary artery disease) and alternatives of a cardiac catheterization were discussed in detail with Kari [name] and she is willing to proceed.  - with Kari [name] Monday 12/19  Patient Instructions / Medication  Changes & Studies & Tests Ordered   Patient Instructions  Medication Instructions:  Your physician recommends that you continue on your current medications as directed. Please refer to the Current Medication list given to you today.  *If you need a refill on your cardiac medications before your next appointment, please call your pharmacy*   Lab Work: None ordered If you have labs (blood work) drawn today and your tests are completely normal, you will receive your results only by: [place] (if you have MyChart) OR A paper copy in the mail If you have any lab test that is abnormal or we need to change your treatment, we will call you to review the results.   Testing/Procedures: Your physician has requested that you have a cardiac catheterization. Cardiac catheterization is used to diagnose and/or treat various heart conditions. Doctors may recommend this procedure for a number of different reasons. The most common reason is to evaluate chest pain. Chest pain can be a symptom of coronary artery disease (CAD), and cardiac catheterization can show whether plaque is narrowing or blocking your hearts arteries. This procedure is also used to evaluate the valves, as well as measure the blood flow and oxygen levels in different parts of your heart. For further information please visit [email or web address]. Please follow instruction sheet, as given.    Follow-Up: At [facility], you and your health needs are our priority.  As part of our continuing mission to provide you with exceptional heart care, we have created designated Provider Care Teams.  These Care Teams include your primary Cardiologist (physician) and Advanced Practice Providers (APPs -  Physician Assistants and Nurse Practitioners) who all work together to provide you with the care you need, when you need it.  We recommend signing up for the patient portal called "MyChart".  Sign up information is provided on this After Visit  Summary.  MyChart is used to connect with patients for Virtual Visits (Telemedicine).  Patients are able to view lab/test results, encounter notes, upcoming appointments, etc.  Non-urgent messages can be sent to your provider as well.   To learn more about what you can do with MyChart, go to [email or web address].    Your next appointment:   4 week(s)  The format for your next appointment:   In Person  Provider:   You may see Glenetta Hew, MD or one of the following Advanced Practice Providers on your designated  Care Team:   Murray Hodgkins, NP Christell Faith, PA-C Cadence Kathlen Mody, [place]   Other Instructions  [address] [place], [place] [address] Dept: [phone] Loc: [phone]  Kari [name]  04/26/2021  You are scheduled for a Cardiac Catheterization on Monday, December 19 with Dr. Kathlyn [place].  1. Please arrive at [facility] [place], [place] [address] at 9:30 AM (This time is one hour before your procedure to ensure your preparation). Free valet parking service is available.   Special note: Every effort is made to have your procedure done on time. Please understand that emergencies sometimes delay scheduled procedures.  2. Diet: Do not eat solid foods after midnight.  The patient may have clear liquids until 5am upon the day of the procedure.  3. Labs: recent labs on file  4. Medication instructions in preparation for your procedure:   Contrast Allergy: No    Stop taking Eliquis (Apixiban) on Friday December 16.  HOLD Lasix and Hydrochlorothiazide the morning of the procedure  On the morning of your procedure, take your Aspirin and any morning medicines NOT listed above.  You may use sips of water.  5. Plan for one night stay--bring personal belongings. 6. Bring a current list of your medications and current insurance cards. 7. You  MUST have a responsible person to drive you home. 8. Someone MUST be with you the first 24 hours after you arrive home or your discharge will be delayed. 9. Please wear clothes that are easy to get on and off and wear slip-on shoes.  Thank you for allowing [place] to care for you!   -- [facility]     Glenetta Hew, M.D., M.S. Interventional Cardiologist   Pager # [phone] Phone # [phone] [address] [place], [place] [address]   Thank you for choosing [facility] in [place]!!

## 2021-04-25 NOTE — H&P (View-Only) (Signed)
Primary Care Provider: [name], MD [facility] Cardiologist: None Electrophysiologist: None Vascular Surgeon: Katha Cabal, MD Pulmonologist: Dr. [facility])  Clinic Note: Chief Complaint  Patient presents with   Follow-up    F/u CT results no complaints today. Meds reviewed verbally with pt.   Coronary Artery Disease    Abnormal cardiac CTA   Atrial Fibrillation    No recurrent symptoms.  No bleeding on Eliquis.    ===================================  ASSESSMENT/PLAN   Problem List Items Addressed This Visit       Cardiology Problems   Paroxysmal atrial fibrillation with rapid ventricular response (HCC); CHA2DS2-VASc score 5 (HTN, DM-2, Age, Female, Aortic Plaque) (Chronic)    No recurrent symptoms of A. fib.  She definitely Nish[place], fast, but had no other symptoms.  Plan: Continue current dose of metoprolol. Tolerating Eliquis.  Consider sleep study, but potential culprit being RCA lesion CAD.      Relevant Medications   sodium chloride flush (NS) 0.9 % injection 3 mL (Start on 04/26/2021 10:00 PM)   Hyperlipidemia associated with type 2 diabetes mellitus (HCC) (Chronic)    With evidence of CAD, with target LDL now less than 70 if not less than 55.  Anticipate increasing statin dose. We will have Dr. Fletcher Anon send off [place] from Cath Lab  -> also increase to 40 mg Atorvastatin if Cath confirms CTA.       Essential hypertension (Chronic)    Blood pressure looks great on current dose of metoprolol and losartan.        Other   Abnormal cardiac CT angiography - Primary    FFR positive proximal RCA lesion.  Noncalcified plaque.  I think her main symptom is probably the fatigue and perhaps this could have been the trigger for her A. Fib.  We talked about the risks, benefits alternatives and indications of heart catheterization versus medical management.  Based on the fact that she is symptomatic with fatigue, I  recommended cardiac catheterization.  With the holidays upcoming, we discussed scheduling the procedure to get it done before Christmas.  This does mean that it would not be performed by me.  We have tentatively scheduled her for Monday morning 04/30/2021 with Dr. Fletcher Anon.  See informed consent note below.      Relevant Medications   sodium chloride flush (NS) 0.9 % injection 3 mL (Start on 04/26/2021 10:00 PM)   ===================================  HPI:    Kari [name] is a Morbidly Obese 75 y.o. female with PMH notable for DM-2 (with diabetic neuropathy - not on insulin), HLD, Vit D deficiency, COPD/(Moderate Persistent Asthma with Recurrent Bronchitis; complicated by Restrictive Lung Disease from Obesity-OHS, likely OSA), GERD & Bilateral LE Lynmphedema / Venous Stasis Edema, Gouty Arthritis & recent diagnosis of Paroxysmal Atrial Fibrillation who is being seen today for follow-up evaluation to discuss test results.  She was initially sees as an ER follow-up at the request of Dr. Marene Lenz  Recent Hospitalizations:  04/09/2021-[facility] ER: Presented with heart racing beginning at 0748.  Was constant.  No chest pain or dyspnea associated with it.  Previous episodes have not lasted more than a minute or 2.  This episode lasting 2-1/2 hours plus. => Noted to be in A. fib RVR-rates in the 140s. => Also noted to have hypokalemia.  Started on diltiazem drip and hydrated with IV fluids.  Spontaneously converted to sinus rhythm rate 74 bpm.  Kari [name] was Recently seen on 04/11/2021 ->  she was in NSR with 1  AVB.  Has not had any further episodes of fast heart rate since leaving hospital.  Actually indicated that heart rate settled down prior to arrival to the ER.  This was not accompanied by chest pain or pressure, simply just tachycardia.  She did feel little lightheaded but no syncope or near syncope.  She may have noted previous episodes lasting much less time with less notable symptoms.   Relatively active at baseline.  She noted about a 40 pound weight loss over the last several months after stopping steroid therapy for arthritis cath t.  Dramatically improved edema.  Taking furosemide..  Reviewed  CV studies:    The following studies were reviewed today: (if available, images/films reviewed: From Epic Chart or Care Everywhere) TTE 05/04/2021 - Pending.  Coronary CT Angiography 04/19/2021:  Coronary Calcium Score 389. Domninant RCA-RDAP<PLA  - prox RCA non-calcified > 70% stenosis.  Moderate proximal LAD (50-69%). Minimal LCx. FFRct - LAD & LCx - No significant stenoses.  Prox RCA Significant FFRct 0.65% --> RECOMMEND CARDIAC CATHETERIZATION.  PA enlargement suggests PA HTN.  R ML Pulmonary Nodule stable c/w 09/13/2019. Aortic Atherosclerosis.  Interval History:   Kari [name] presents here today essentially for hospital follow-up after recent diagnosis of A. fib.  After R FFR recently, she really has no chest pain or pressure at rest or exertion.  No sensation of irregular heartbeats palpitations.  She has not had any further episodes like the episode that took her to the ER.  No significant edema but when she does note is that she has been significant fatigue for the last month or 2.  She is not able to do nearly the amount of activity she was hoping to be able to do.  She was hoping that being off of the prednisone, but she was can be able to get back and exercise, lose weight, and she has not had any energy. She denies any PND, orthopnea with mild edema.  CV Review of Symptoms (Summary) Cardiovascular ROS: no chest pain or dyspnea on exertion positive for - -generalized fatigue, exercise intolerance.  No energy.  Edema well controlled. negative for - edema, irregular heartbeat, orthopnea, palpitations, paroxysmal nocturnal dyspnea, rapid heart rate, shortness of breath, or lightheadedness, dizziness or wooziness, syncope/near syncope or TIA/ amaurosis fugax,  claudication  REVIEWED OF SYSTEMS   Review of Systems  Constitutional:  Positive for malaise/fatigue (She did not mention this last time, but was having to think back about any potential symptoms and acknowledges that she has been fatigued over the last 2 months.) and weight loss (Stable weight now.).  HENT:  Negative for nosebleeds.   Respiratory:  Negative for cough, shortness of breath and wheezing.   Cardiovascular:        Per HPI  Gastrointestinal:  Negative for blood in stool and melena.  Genitourinary:  Negative for flank pain and hematuria.  Musculoskeletal:  Positive for joint pain (Not really having any active arthritis pains.). Negative for back pain.  Neurological:  Negative for dizziness and focal weakness.  Psychiatric/Behavioral:  Negative for depression and memory loss. The patient is nervous/anxious (Has been anxious since hearing the results of her study.). The patient does not have insomnia.    I have reviewed and (if needed) personally updated the patient's problem list, medications, allergies, past medical and surgical history, social and family history.   PAST MEDICAL HISTORY   Past Medical History:  Diagnosis Date   Allergy  to cat, dogs, pollen, dust, cigarette smoke   Arthritis    feet   Cataract    removed both eyes   COPD (chronic obstructive pulmonary disease) with chronic bronchitis (HCC)    And moderate persistent asthma; complicated by Restrictive Lung Disease from obesity   Coronary artery disease involving native coronary artery of native heart with other form of angina pectoris ([place]) 04/20/2019   (Evaluation for new onset A. fib) Coronary Calcium Score 389. Domninant RCA-RDAP<PLA  - prox RCA non-calcified > 70% stenosis.  Moderate proximal LAD (50-69%). Minimal LCx.  FFRCT of LAD and LCx not significant. Prox RCA Significant FFRct 0.65% --> RECOMMEND CARDIAC CATHETERIZATION.   Depression    GERD (gastroesophageal reflux disease)    Hx of  adenomatous colonic polyps 05/30/2010   Hyperlipidemia    Hypertension    Lymphedema of both lower extremities    No superficial venous reflux noted.  However right-sided saphenofemoral junction reflux noted and left common femoral vein reflux noted.  No DVT or superficial thrombus.   Menopause    Migraines    Miscarriage    X 1   Moderate persistent asthma    PONV (postoperative nausea and vomiting)     PAST SURGICAL HISTORY   Past Surgical History:  Procedure Laterality Date   CATARACT EXTRACTION, BILATERAL     CESAREAN SECTION     X 2   COLONOSCOPY     DENTAL SURGERY  09/2016   POLYPECTOMY     TRANSTHORACIC ECHOCARDIOGRAM  11/04/2019   EF 55 to 60%.  Normal LV size and function.  GR 1 DD.  Unable to assess.  Mild LA dilation.  Normal aortic and mitral valve.  Normal RVP.  (Normal study)   TUBAL LIGATION  1980   Lower Extremity Venous Dopplers 01/11/2021:  - Venous reflux is noted in the right sapheno-femoral junction. Left: - No evidence of superficial venous reflux seen in the left greater saphenous vein. - No evidence of superficial venous reflux seen in the left short saphenous vein. - Venous reflux is noted in the left common femoral vein.  Immunization History  Administered Date(s) Administered   Fluad Quad(high Dose 65+) 02/28/2020   Influenza, High Dose Seasonal PF 02/24/2018, 03/15/2019   Influenza,inj,Quad PF,6+ Mos 03/03/2013   Influenza-Unspecified 01/11/2014, 02/13/2015, 02/27/2016, 03/04/2017, 02/25/2021   Moderna Covid-19 Vaccine Bivalent Booster 46yrs & up 02/25/2021   PFIZER Comirnaty(Gray Top)Covid-19 Tri-Sucrose Vaccine 11/09/2020   PFIZER(Purple Top)SARS-COV-2 Vaccination 07/02/2019, 07/23/2019, 02/28/2020   Pneumococcal Conjugate-13 03/03/2013   Pneumococcal Polysaccharide-23 08/14/2015   Tdap 03/03/2008    MEDICATIONS/ALLERGIES   Current Meds  Medication Sig   albuterol (VENTOLIN HFA) 108 (90 Base) MCG/ACT inhaler Inhale 2 puffs by mouth into  the lungs every 6 hours as needed for wheezing or shortness of breath   allopurinol (ZYLOPRIM) 300 MG tablet Take 1 tablet (300 mg total) by mouth daily.   apixaban (ELIQUIS) 5 MG TABS tablet Take 1 tablet (5 mg total) by mouth 2 (two) times daily.   atorvastatin (LIPITOR) 10 MG tablet Take 1 tablet (10 mg total) by mouth daily.   benzonatate (TESSALON) 200 MG capsule Take 1 capsule (200 mg total) by mouth 3 (three) times daily as needed for cough.   cetirizine (ZYRTEC) 10 MG tablet Take 10 mg by mouth daily.   colchicine 0.6 MG tablet Take 1 tablet (0.6 mg total) by mouth 2 (two) times daily. As needed for gout flare.   diflorasone (PSORCON) 0.05 % cream Apply 1  application topically 2 (two) times daily.   esomeprazole (NEXIUM) 20 MG capsule Take 20 mg by mouth daily at 12 noon.   fluticasone-salmeterol (ADVAIR DISKUS) 250-50 MCG/ACT AEPB TAKE 1 PUFF BY MOUTH TWICE A DAY   furosemide (LASIX) 20 MG tablet TAKE 1 TABLET BY MOUTH EVERY OTHER DAY   hydrochlorothiazide (HYDRODIURIL) 25 MG tablet Take 1 tablet (25 mg total) by mouth daily.   losartan (COZAAR) 50 MG tablet Take 1 tablet (50 mg total) by mouth at bedtime.   metoprolol tartrate (LOPRESSOR) 25 MG tablet Take 1 tablet (25 mg total) by mouth 2 (two) times daily.   mirtazapine (REMERON) 45 MG tablet Take 1 tablet (45 mg total) by mouth at bedtime.   montelukast (SINGULAIR) 10 MG tablet Take 1 tablet (10 mg total) by mouth at bedtime.   OVER THE COUNTER MEDICATION Nasocort nasal spray   Probiotic Product (PROBIOTIC PO) Take by mouth.   traZODone (DESYREL) 50 MG tablet Take 0.5-1 tablets (25-50 mg total) by mouth at bedtime as needed for sleep.   triamcinolone lotion (KENALOG) 0.1 % Apply to affected area(s) twice a day   Current Facility-Administered Medications for the 04/26/21 encounter (Office Visit) with Leonie Man, MD  Medication   0.9 %  sodium chloride infusion   sodium chloride flush (NS) 0.9 % injection 3 mL    Allergies   Allergen Reactions   Nifedipine     REACTION: swollen feet    SOCIAL HISTORY/FAMILY HISTORY   Reviewed in Epic:   Social History   Tobacco Use   Smoking status: Former    Types: Cigarettes    Quit date: 01/02/1971    Years since quitting: 50.3   Smokeless tobacco: Never  Vaping Use   Vaping Use: Never used  Substance Use Topics   Alcohol use: Not Currently    Comment: occasional- wine gives pt headache only drinks once every 6 months   Drug use: No   Social History   Social History Narrative   Not on file   Family History  Problem Relation Age of Onset   Myasthenia gravis Mother    Cervical cancer Mother    Heart disease Father    Coronary artery disease Father    Valvular heart disease Father    Heart attack Father 34   Cancer Sister 38       colon   Colon cancer Sister 35   Heart Problems Sister    Heart Problems Brother    Heart attack Paternal Grandfather        24's   Breast cancer Neg Hx    Esophageal cancer Neg Hx    Colon polyps Neg Hx    Rectal cancer Neg Hx    Stomach cancer Neg Hx     OBJCTIVE -PE, EKG, labs   Wt Readings from Last 3 Encounters:  04/26/21 206 lb 4 oz (93.6 kg)  04/16/21 205 lb (93 kg)  04/11/21 209 lb 4 oz (94.9 kg)    Physical Exam: BP 110/60 (BP Location: Left Arm, Patient Position: Sitting, Cuff Size: Normal)    Pulse (!) 58    Ht 5\' 4"  (1.626 m)    Wt 206 lb 4 oz (93.6 kg)    SpO2 98%    BMI 35.40 kg/m  Physical Exam Vitals reviewed.  Constitutional:      General: She is not in acute distress.    Appearance: Normal appearance. She is obese. She is not ill-appearing.  Comments: Well-groomed.  Healthy-appearing.  HENT:     Head: Normocephalic and atraumatic.  Neck:     Vascular: No carotid bruit or JVD.  Cardiovascular:     Rate and Rhythm: Normal rate and regular rhythm. Occasional Extrasystoles are present.    Chest Wall: PMI is not displaced.     Pulses: Intact distal pulses. No decreased pulses.      Heart sounds: S1 normal and S2 normal. Heart sounds are distant. No murmur heard.   No friction rub. No gallop.  Pulmonary:     Effort: Pulmonary effort is normal. No respiratory distress.     Breath sounds: Normal breath sounds. No wheezing, rhonchi or rales.  Chest:     Chest wall: No tenderness.  Abdominal:     Palpations: Mass: No HSM or bruit.     Comments: Truncal obesity  Musculoskeletal:        General: Signs of injury (Left wrist and splint in place-possible carpal tunnel syndrome symptoms.) present. No swelling.     Cervical back: Normal range of motion and neck supple.  Skin:    General: Skin is warm and dry.     Coloration: Skin is not jaundiced or pale.  Neurological:     General: No focal deficit present.     Mental Status: She is alert and oriented to person, place, and time.     Gait: Gait normal.  Psychiatric:        Mood and Affect: Mood normal.        Behavior: Behavior normal.        Thought Content: Thought content normal.        Judgment: Judgment normal.     Adult ECG Report  Rate: 58;  Rhythm: normal sinus rhythm and  low voltage.  Cannot rule out anterior MI, age-indeterminate. ;   Narrative Interpretation: Borderline EKG. stable.  EKG from ER 04/09/2021:  Rate: 183 ;  Rhythm: atrial fibrillation and rapid ventricular rate with PVCs/aberrantly conducted beats.  Cannot exclude anterior infarction age indeterminate.  Low voltage. ;   Narrative Interpretation: New onset A. fib RVR -> Follow-up EKG showed sinus rhythm, rate 76 bpm.  Recent Labs: Reviewed Lab Results  Component Value Date   CHOL 166 09/08/2020   HDL 40 (L) 09/08/2020   LDLCALC 85 09/08/2020   LDLDIRECT 92.0 02/18/2019   TRIG 337 (H) 09/08/2020   CHOLHDL 4.2 09/08/2020   .  Chemistry panel checked today. Lab Results  Component Value Date   CREATININE 0.90 04/11/2021   BUN 11 04/11/2021   NA 134 04/11/2021   K 4.4 04/11/2021   CL 98 04/11/2021   CO2 24 04/11/2021   CBC Latest  Ref Rng & Units 04/09/2021 09/13/2019 07/23/2017  WBC 4.0 - 10.5 K/uL 8.2 10.2 9.5  Hemoglobin 12.0 - 15.0 g/dL 11.9(L) 12.4 12.9  Hematocrit 36.0 - 46.0 % 34.3(L) 35.2(L) 37.6  Platelets 150 - 400 K/uL 264 213 235.0    Lab Results  Component Value Date   HGBA1C 5.8 04/16/2021   Lab Results  Component Value Date   TSH 1.900 04/09/2021    ==================================================  COVID-19 Education: The signs and symptoms of COVID-19 were discussed with the patient and how to seek care for testing (follow up with PCP or arrange E-visit).    I spent a total of 32 minutes with the patient spent in direct patient consultation.  Additional time spent with chart review  / charting (studies, outside notes, etc): 12 min (12  min pre-charting) Total Time: 56 min  Current medicines are reviewed at length with the patient today.  (+/- concerns) N/A  This visit occurred during the SARS-CoV-2 public health emergency.  Safety protocols were in place, including screening questions prior to the visit, additional usage of staff PPE, and extensive cleaning of exam room while observing appropriate contact time as indicated for disinfecting solutions.  Notice: This dictation was prepared with Dragon dictation along with smart phrase technology. Any transcriptional errors that result from this process are unintentional and may not be corrected upon review.   Studies Ordered:  Orders Placed This Encounter  Procedures   EKG 12-Lead   Shared Decision Making/Informed Consent The risks [stroke (1 in 1000), death (1 in 1000), kidney failure [usually temporary] (1 in 500), bleeding (1 in 200), allergic reaction [possibly serious] (1 in 200)], benefits (diagnostic support and management of coronary artery disease) and alternatives of a cardiac catheterization were discussed in detail with Ms. Andis and she is willing to proceed.  - with Dr. Fletcher Anon Monday 12/19  Patient Instructions / Medication  Changes & Studies & Tests Ordered   Patient Instructions  Medication Instructions:  Your physician recommends that you continue on your current medications as directed. Please refer to the Current Medication list given to you today.  *If you need a refill on your cardiac medications before your next appointment, please call your pharmacy*   Lab Work: None ordered If you have labs (blood work) drawn today and your tests are completely normal, you will receive your results only by: [facility] (if you have MyChart) OR A paper copy in the mail If you have any lab test that is abnormal or we need to change your treatment, we will call you to review the results.   Testing/Procedures: Your physician has requested that you have a cardiac catheterization. Cardiac catheterization is used to diagnose and/or treat various heart conditions. Doctors may recommend this procedure for a number of different reasons. The most common reason is to evaluate chest pain. Chest pain can be a symptom of coronary artery disease (CAD), and cardiac catheterization can show whether plaque is narrowing or blocking your hearts arteries. This procedure is also used to evaluate the valves, as well as measure the blood flow and oxygen levels in different parts of your heart. For further information please visit [email or web address]. Please follow instruction sheet, as given.    Follow-Up: At [facility], you and your health needs are our priority.  As part of our continuing mission to provide you with exceptional heart care, we have created designated Provider Care Teams.  These Care Teams include your primary Cardiologist (physician) and Advanced Practice Providers (APPs -  Physician Assistants and Nurse Practitioners) who all work together to provide you with the care you need, when you need it.  We recommend signing up for the patient portal called "MyChart".  Sign up information is provided on this After Visit  Summary.  MyChart is used to connect with patients for Virtual Visits (Telemedicine).  Patients are able to view lab/test results, encounter notes, upcoming appointments, etc.  Non-urgent messages can be sent to your provider as well.   To learn more about what you can do with MyChart, go to [email or web address].    Your next appointment:   4 week(s)  The format for your next appointment:   In Person  Provider:   You may see Glenetta Hew, MD or one of the following Advanced Practice Providers on your designated  Care Team:   Murray Hodgkins, NP Christell Faith, PA-C Cadence Kathlen Mody, [place]   Other Instructions  [address], [place] [address] Dept: [phone] Loc: [phone]  Kari [name]  04/26/2021  You are scheduled for a Cardiac Catheterization on Monday, December 19 with Dr. Kathlyn [place].  1. Please arrive at [facility] [place], [place] [address] at 9:30 AM (This time is one hour before your procedure to ensure your preparation). Free valet parking service is available.   Special note: Every effort is made to have your procedure done on time. Please understand that emergencies sometimes delay scheduled procedures.  2. Diet: Do not eat solid foods after midnight.  The patient may have clear liquids until 5am upon the day of the procedure.  3. Labs: recent labs on file  4. Medication instructions in preparation for your procedure:   Contrast Allergy: No    Stop taking Eliquis (Apixiban) on Friday December 16.  HOLD Lasix and Hydrochlorothiazide the morning of the procedure  On the morning of your procedure, take your Aspirin and any morning medicines NOT listed above.  You may use sips of water.  5. Plan for one night stay--bring personal belongings. 6. Bring a current list of your medications and current insurance cards. 7. You  MUST have a responsible person to drive you home. 8. Someone MUST be with you the first 24 hours after you arrive home or your discharge will be delayed. 9. Please wear clothes that are easy to get on and off and wear slip-on shoes.  Thank you for allowing [place] to care for you!   -- [facility]     Glenetta Hew, M.D., M.S. Interventional Cardiologist   Pager # [phone] Phone # [phone] [place], [place] [address]   Thank you for choosing [facility] in [place]!!

## 2021-04-26 ENCOUNTER — Encounter: Payer: Self-pay | Admitting: Cardiology

## 2021-04-26 ENCOUNTER — Other Ambulatory Visit: Payer: Self-pay

## 2021-04-26 ENCOUNTER — Ambulatory Visit: Payer: PPO | Admitting: Cardiology

## 2021-04-26 VITALS — BP 110/60 | HR 58 | Ht 64.0 in | Wt 206.2 lb

## 2021-04-26 DIAGNOSIS — E785 Hyperlipidemia, unspecified: Secondary | ICD-10-CM

## 2021-04-26 DIAGNOSIS — I10 Essential (primary) hypertension: Secondary | ICD-10-CM

## 2021-04-26 DIAGNOSIS — E1169 Type 2 diabetes mellitus with other specified complication: Secondary | ICD-10-CM | POA: Diagnosis not present

## 2021-04-26 DIAGNOSIS — I2510 Atherosclerotic heart disease of native coronary artery without angina pectoris: Secondary | ICD-10-CM | POA: Diagnosis not present

## 2021-04-26 DIAGNOSIS — I480 Paroxysmal atrial fibrillation: Secondary | ICD-10-CM | POA: Diagnosis not present

## 2021-04-26 DIAGNOSIS — R931 Abnormal findings on diagnostic imaging of heart and coronary circulation: Secondary | ICD-10-CM

## 2021-04-26 MED ORDER — SODIUM CHLORIDE 0.9% FLUSH: 3.0000 mL | Freq: Two times a day (BID) | INTRAVENOUS | Status: DC

## 2021-04-26 NOTE — Assessment & Plan Note (Signed)
With evidence of CAD, with target LDL now less than 70 if not less than 55.  Anticipate increasing statin dose. We will have Dr. Fletcher Anon send off [place] from Cath Lab  -> also increase to 40 mg Atorvastatin if Cath confirms CTA.

## 2021-04-26 NOTE — Patient Instructions (Signed)
Medication Instructions:  Your physician recommends that you continue on your current medications as directed. Please refer to the Current Medication list given to you today.  *If you need a refill on your cardiac medications before your next appointment, please call your pharmacy*   Lab Work: None ordered If you have labs (blood work) drawn today and your tests are completely normal, you will receive your results only by: [place] (if you have MyChart) OR A paper copy in the mail If you have any lab test that is abnormal or we need to change your treatment, we will call you to review the results.   Testing/Procedures: Your physician has requested that you have a cardiac catheterization. Cardiac catheterization is used to diagnose and/or treat various heart conditions. Doctors may recommend this procedure for a number of different reasons. The most common reason is to evaluate chest pain. Chest pain can be a symptom of coronary artery disease (CAD), and cardiac catheterization can show whether plaque is narrowing or blocking your hearts arteries. This procedure is also used to evaluate the valves, as well as measure the blood flow and oxygen levels in different parts of your heart. For further information please visit [email or web address]. Please follow instruction sheet, as given.    Follow-Up: At [facility], you and your health needs are our priority.  As part of our continuing mission to provide you with exceptional heart care, we have created designated Provider Care Teams.  These Care Teams include your primary Cardiologist (physician) and Advanced Practice Providers (APPs -  Physician Assistants and Nurse Practitioners) who all work together to provide you with the care you need, when you need it.  We recommend signing up for the patient portal called "MyChart".  Sign up information is provided on this After Visit Summary.  MyChart is used to connect with patients for Virtual  Visits (Telemedicine).  Patients are able to view lab/test results, encounter notes, upcoming appointments, etc.  Non-urgent messages can be sent to your provider as well.   To learn more about what you can do with MyChart, go to [email or web address].    Your next appointment:   4 week(s)  The format for your next appointment:   In Person  Provider:   You may see Glenetta Hew, MD or one of the following Advanced Practice Providers on your designated Care Team:   Murray Hodgkins, NP Christell Faith, PA-C Cadence Kathlen Mody, [place]   Other Instructions  [address], [place] [address] Dept: [phone] Loc: [place]  04/26/2021  You are scheduled for a Cardiac Catheterization on Monday, December 19 with Dr. Kathlyn Sacramento.  1. Please arrive at [facility] [place], [place] [address] at 9:30 AM (This time is one hour before your procedure to ensure your preparation). Free valet parking service is available.   Special note: Every effort is made to have your procedure done on time. Please understand that emergencies sometimes delay scheduled procedures.  2. Diet: Do not eat solid foods after midnight.  The patient may have clear liquids until 5am upon the day of the procedure.  3. Labs: recent labs on file  4. Medication instructions in preparation for your procedure:   Contrast Allergy: No    Stop taking Eliquis (Apixiban) on Friday December 16.  HOLD Lasix and Hydrochlorothiazide the morning of the procedure  On the morning of your procedure, take your  Aspirin and any morning medicines NOT listed above.  You may use sips of water.  5. Plan for one night stay--bring personal belongings. 6. Bring a current list of your medications and current insurance cards. 7. You MUST have a responsible person to drive you home. 8. Someone MUST  be with you the first 24 hours after you arrive home or your discharge will be delayed. 9. Please wear clothes that are easy to get on and off and wear slip-on shoes.  Thank you for allowing [place] to care for you!   -- [facility]

## 2021-04-26 NOTE — Assessment & Plan Note (Signed)
No recurrent symptoms of A. fib.  She definitely Nishan, fast, but had no other symptoms.  Plan: Continue current dose of metoprolol. Tolerating Eliquis.  Consider sleep study, but potential culprit being RCA lesion CAD.

## 2021-04-26 NOTE — Assessment & Plan Note (Signed)
Blood pressure looks great on current dose of metoprolol and losartan.

## 2021-04-26 NOTE — Assessment & Plan Note (Signed)
FFR positive proximal RCA lesion.  Noncalcified plaque.  I think her main symptom is probably the fatigue and perhaps this could have been the trigger for her A. Fib.  We talked about the risks, benefits alternatives and indications of heart catheterization versus medical management.  Based on the fact that she is symptomatic with fatigue, I recommended cardiac catheterization.  With the holidays upcoming, we discussed scheduling the procedure to get it done before Christmas.  This does mean that it would not be performed by me.  We have tentatively scheduled her for Monday morning 04/30/2021 with Dr. Fletcher Anon.  See informed consent note below.

## 2021-04-27 ENCOUNTER — Telehealth: Payer: Self-pay | Admitting: *Deleted

## 2021-04-27 MED ORDER — APIXABAN 5 MG PO TABS: 5.0000 mg | ORAL_TABLET | Freq: Two times a day (BID) | ORAL | 3 refills | Status: DC

## 2021-04-27 NOTE — Telephone Encounter (Signed)
I spoke with the patient and confirmed her arrival time for her cath on Monday 04/30/21 will be 9:30 am for a 10:30 am case as discussed in the office yesterday.  The patient voices understanding and is agreeable.

## 2021-04-27 NOTE — Telephone Encounter (Signed)
-----   Message from Lamar Laundry, RN sent at 04/26/2021  5:19 PM EST ----- Regarding: Patients cath 04/30/21 Hello Ladies,  This is a patient of Dr. Darcus Pester that's is tentatively scheduled for a cath on 04/30/21 @ 10: 30 am with Dr. Fletcher Anon.  The patient was seen after 5pm. I did leave a detailed message for Pamala Hurry and Marcie Bal to add the patient on for the date and time above. I am out of the office until Tues. Could someone please review and confirm that the patients cath is scheduled as above. Pt has already been given her instructions and that date and time.  If the time or date needs to be changed the patient will need to be called to make her aware. I did give her a heads up about that.   Thanks, Lattie Haw

## 2021-04-27 NOTE — Telephone Encounter (Signed)
Pt last saw Dr Ellyn Hack 04/26/21, last labs 04/11/21 Creat 0.90, age 75, weight 93.6kg, based on specified criteria pt is on appropriate dosage of Eliquis 5mg  BID for afib.  Will refill rx.

## 2021-04-27 NOTE — Telephone Encounter (Signed)
Reviewed the patient's chart and confirmed scheduling has put her on the schedule for her Cardiac Cath on Monday 04/30/21.  She will need to arrive at 9:30 am for a 10:30 am case as discussed in clinic yesterday evening.   Attempted to call the patient just to confirm the arrival time she was given in clinic yesterday evening was still correct.   No answer- I left a message to please call back. No DPR on file.

## 2021-04-30 ENCOUNTER — Encounter
Admission: RE | Disposition: A | Discharge status: Home / Self Care | Payer: Self-pay | Source: Home / Self Care | Attending: Cardiovascular Disease

## 2021-04-30 ENCOUNTER — Ambulatory Visit
Admission: RE | Admit: 2021-04-30 | Discharge: 2021-05-01 | Disposition: A | Discharge status: Home / Self Care | Payer: PPO | Attending: Cardiovascular Disease | Admitting: Cardiovascular Disease

## 2021-04-30 ENCOUNTER — Encounter: Payer: Self-pay | Admitting: Cardiovascular Disease

## 2021-04-30 ENCOUNTER — Other Ambulatory Visit: Payer: Self-pay

## 2021-04-30 DIAGNOSIS — E1140 Type 2 diabetes mellitus with diabetic neuropathy, unspecified: Secondary | ICD-10-CM | POA: Insufficient documentation

## 2021-04-30 DIAGNOSIS — Z6841 Body Mass Index (BMI) 40.0 and over, adult: Secondary | ICD-10-CM | POA: Diagnosis not present

## 2021-04-30 DIAGNOSIS — K219 Gastro-esophageal reflux disease without esophagitis: Secondary | ICD-10-CM | POA: Diagnosis present

## 2021-04-30 DIAGNOSIS — Z955 Presence of coronary angioplasty implant and graft: Secondary | ICD-10-CM | POA: Diagnosis not present

## 2021-04-30 DIAGNOSIS — I10 Essential (primary) hypertension: Secondary | ICD-10-CM | POA: Diagnosis not present

## 2021-04-30 DIAGNOSIS — M1990 Unspecified osteoarthritis, unspecified site: Secondary | ICD-10-CM | POA: Insufficient documentation

## 2021-04-30 DIAGNOSIS — I2510 Atherosclerotic heart disease of native coronary artery without angina pectoris: Secondary | ICD-10-CM

## 2021-04-30 DIAGNOSIS — E669 Obesity, unspecified: Secondary | ICD-10-CM

## 2021-04-30 DIAGNOSIS — E1169 Type 2 diabetes mellitus with other specified complication: Secondary | ICD-10-CM | POA: Diagnosis present

## 2021-04-30 DIAGNOSIS — J45909 Unspecified asthma, uncomplicated: Secondary | ICD-10-CM | POA: Diagnosis present

## 2021-04-30 DIAGNOSIS — E785 Hyperlipidemia, unspecified: Secondary | ICD-10-CM | POA: Insufficient documentation

## 2021-04-30 DIAGNOSIS — Z800 Family history of malignant neoplasm of digestive organs: Secondary | ICD-10-CM

## 2021-04-30 DIAGNOSIS — I25118 Atherosclerotic heart disease of native coronary artery with other forms of angina pectoris: Secondary | ICD-10-CM | POA: Diagnosis not present

## 2021-04-30 DIAGNOSIS — J449 Chronic obstructive pulmonary disease, unspecified: Secondary | ICD-10-CM | POA: Insufficient documentation

## 2021-04-30 DIAGNOSIS — I2089 Other forms of angina pectoris: Secondary | ICD-10-CM | POA: Diagnosis present

## 2021-04-30 DIAGNOSIS — I208 Other forms of angina pectoris: Secondary | ICD-10-CM | POA: Diagnosis present

## 2021-04-30 DIAGNOSIS — I480 Paroxysmal atrial fibrillation: Secondary | ICD-10-CM | POA: Diagnosis not present

## 2021-04-30 DIAGNOSIS — E1159 Type 2 diabetes mellitus with other circulatory complications: Secondary | ICD-10-CM | POA: Diagnosis present

## 2021-04-30 DIAGNOSIS — R931 Abnormal findings on diagnostic imaging of heart and coronary circulation: Secondary | ICD-10-CM

## 2021-04-30 DIAGNOSIS — E66811 Obesity, class 1: Secondary | ICD-10-CM

## 2021-04-30 HISTORY — PX: CORONARY STENT INTERVENTION: CATH118234

## 2021-04-30 HISTORY — PX: LEFT HEART CATH AND CORONARY ANGIOGRAPHY: CATH118249

## 2021-04-30 LAB — POCT ACTIVATED CLOTTING TIME
Activated Clotting Time: 257 seconds
Activated Clotting Time: 263 seconds

## 2021-04-30 SURGERY — LEFT HEART CATH AND CORONARY ANGIOGRAPHY
Anesthesia: Moderate Sedation

## 2021-04-30 MED ORDER — CLOPIDOGREL BISULFATE 75 MG PO TABS
ORAL_TABLET | ORAL | Status: AC
  Filled 2021-04-30: qty 8

## 2021-04-30 MED ORDER — CLOPIDOGREL BISULFATE 75 MG PO TABS
ORAL_TABLET | ORAL | Status: DC | PRN
  Administered 2021-04-30: 600 mg via ORAL

## 2021-04-30 MED ORDER — CALCIUM CARBONATE 1250 (500 CA) MG PO TABS
1250.0000 mg | ORAL_TABLET | Freq: Every day | ORAL | Status: DC
  Administered 2021-05-01: 09:00:00 1250 mg via ORAL
  Filled 2021-04-30: qty 1

## 2021-04-30 MED ORDER — HEPARIN (PORCINE) IN NACL 1000-0.9 UT/500ML-% IV SOLN
INTRAVENOUS | Status: AC
  Filled 2021-04-30: qty 1000

## 2021-04-30 MED ORDER — ASPIRIN 81 MG PO CHEW: 81.0000 mg | CHEWABLE_TABLET | ORAL | Status: DC

## 2021-04-30 MED ORDER — SODIUM CHLORIDE 0.9 % WEIGHT BASED INFUSION
3.0000 mL/kg/h | INTRAVENOUS | Status: DC
  Administered 2021-04-30: 12:00:00 3 mL/kg/h via INTRAVENOUS

## 2021-04-30 MED ORDER — FLUTICASONE FUROATE-VILANTEROL 200-25 MCG/ACT IN AEPB
1.0000 | INHALATION_SPRAY | Freq: Every day | RESPIRATORY_TRACT | Status: DC
  Administered 2021-05-01: 09:00:00 1 via RESPIRATORY_TRACT
  Filled 2021-04-30: qty 28

## 2021-04-30 MED ORDER — LIDOCAINE HCL (PF) 1 % IJ SOLN
INTRAMUSCULAR | Status: DC | PRN
  Administered 2021-04-30: 2 mL

## 2021-04-30 MED ORDER — MIDAZOLAM HCL 2 MG/2ML IJ SOLN
INTRAMUSCULAR | Status: DC | PRN
  Administered 2021-04-30: 1 mg via INTRAVENOUS

## 2021-04-30 MED ORDER — LABETALOL HCL 5 MG/ML IV SOLN: 10.0000 mg | INTRAVENOUS | Status: AC | PRN

## 2021-04-30 MED ORDER — MONTELUKAST SODIUM 10 MG PO TABS
10.0000 mg | ORAL_TABLET | Freq: Every day | ORAL | Status: DC
  Administered 2021-04-30: 22:00:00 10 mg via ORAL
  Filled 2021-04-30: qty 1

## 2021-04-30 MED ORDER — CLOPIDOGREL BISULFATE 75 MG PO TABS
75.0000 mg | ORAL_TABLET | Freq: Every day | ORAL | Status: DC
  Administered 2021-05-01: 09:00:00 75 mg via ORAL
  Filled 2021-04-30: qty 1

## 2021-04-30 MED ORDER — MIRTAZAPINE 15 MG PO TABS
45.0000 mg | ORAL_TABLET | Freq: Every day | ORAL | Status: DC
  Administered 2021-04-30: 22:00:00 45 mg via ORAL
  Filled 2021-04-30: qty 3

## 2021-04-30 MED ORDER — LIDOCAINE HCL 1 % IJ SOLN
INTRAMUSCULAR | Status: AC
  Filled 2021-04-30: qty 20

## 2021-04-30 MED ORDER — ASPIRIN 81 MG PO CHEW
81.0000 mg | CHEWABLE_TABLET | Freq: Every day | ORAL | Status: DC
  Administered 2021-05-01: 09:00:00 81 mg via ORAL
  Filled 2021-04-30: qty 1

## 2021-04-30 MED ORDER — SODIUM CHLORIDE 0.9 % IV SOLN: INTRAVENOUS | Status: AC

## 2021-04-30 MED ORDER — LOSARTAN POTASSIUM 50 MG PO TABS
50.0000 mg | ORAL_TABLET | Freq: Every day | ORAL | Status: DC
  Administered 2021-04-30: 22:00:00 50 mg via ORAL
  Filled 2021-04-30: qty 1

## 2021-04-30 MED ORDER — FUROSEMIDE 20 MG PO TABS
20.0000 mg | ORAL_TABLET | ORAL | Status: DC
  Administered 2021-05-01: 08:00:00 20 mg via ORAL
  Filled 2021-04-30: qty 1

## 2021-04-30 MED ORDER — ATORVASTATIN CALCIUM 10 MG PO TABS
10.0000 mg | ORAL_TABLET | Freq: Every day | ORAL | Status: DC
  Administered 2021-05-01: 08:00:00 10 mg via ORAL
  Filled 2021-04-30: qty 1

## 2021-04-30 MED ORDER — MIDAZOLAM HCL 2 MG/2ML IJ SOLN
INTRAMUSCULAR | Status: AC
  Filled 2021-04-30: qty 2

## 2021-04-30 MED ORDER — SODIUM CHLORIDE 0.9% FLUSH: 3.0000 mL | INTRAVENOUS | Status: DC | PRN

## 2021-04-30 MED ORDER — SODIUM CHLORIDE 0.9 % IV SOLN: 250.0000 mL | INTRAVENOUS | Status: DC | PRN

## 2021-04-30 MED ORDER — ONDANSETRON HCL 4 MG/2ML IJ SOLN: 4.0000 mg | Freq: Four times a day (QID) | INTRAMUSCULAR | Status: DC | PRN

## 2021-04-30 MED ORDER — HYDROCHLOROTHIAZIDE 25 MG PO TABS
25.0000 mg | ORAL_TABLET | Freq: Every day | ORAL | Status: DC
  Administered 2021-05-01: 08:00:00 25 mg via ORAL
  Filled 2021-04-30: qty 1

## 2021-04-30 MED ORDER — ASPIRIN 81 MG PO CHEW
CHEWABLE_TABLET | ORAL | Status: DC | PRN
  Administered 2021-04-30: 243 mg via ORAL

## 2021-04-30 MED ORDER — TRAZODONE HCL 50 MG PO TABS: 25.0000 mg | ORAL_TABLET | Freq: Every evening | ORAL | Status: DC | PRN

## 2021-04-30 MED ORDER — BENZONATATE 100 MG PO CAPS: 200.0000 mg | ORAL_CAPSULE | Freq: Three times a day (TID) | ORAL | Status: DC | PRN

## 2021-04-30 MED ORDER — HEPARIN SODIUM (PORCINE) 1000 UNIT/ML IJ SOLN
INTRAMUSCULAR | Status: AC
  Filled 2021-04-30: qty 10

## 2021-04-30 MED ORDER — PANTOPRAZOLE SODIUM 40 MG PO TBEC
40.0000 mg | DELAYED_RELEASE_TABLET | Freq: Every day | ORAL | Status: DC
  Administered 2021-05-01: 09:00:00 40 mg via ORAL
  Filled 2021-04-30: qty 1

## 2021-04-30 MED ORDER — COLCHICINE 0.6 MG PO TABS: 0.6000 mg | ORAL_TABLET | Freq: Two times a day (BID) | ORAL | Status: DC | PRN

## 2021-04-30 MED ORDER — NITROGLYCERIN 1 MG/10 ML FOR IR/CATH LAB
INTRA_ARTERIAL | Status: DC | PRN
  Administered 2021-04-30: 200 ug via INTRACORONARY

## 2021-04-30 MED ORDER — ALBUTEROL SULFATE (2.5 MG/3ML) 0.083% IN NEBU: 3.0000 mL | INHALATION_SOLUTION | Freq: Four times a day (QID) | RESPIRATORY_TRACT | Status: DC | PRN

## 2021-04-30 MED ORDER — FENTANYL CITRATE (PF) 100 MCG/2ML IJ SOLN
INTRAMUSCULAR | Status: AC
  Filled 2021-04-30: qty 2

## 2021-04-30 MED ORDER — METOPROLOL TARTRATE 25 MG PO TABS
25.0000 mg | ORAL_TABLET | Freq: Two times a day (BID) | ORAL | Status: DC
  Administered 2021-04-30 – 2021-05-01 (×2): 25 mg via ORAL
  Filled 2021-04-30 (×2): qty 1

## 2021-04-30 MED ORDER — ACETAMINOPHEN 325 MG PO TABS: 650.0000 mg | ORAL_TABLET | ORAL | Status: DC | PRN

## 2021-04-30 MED ORDER — SODIUM CHLORIDE 0.9 % WEIGHT BASED INFUSION
1.0000 mL/kg/h | INTRAVENOUS | Status: DC
  Administered 2021-04-30: 12:00:00 1 mL/kg/h via INTRAVENOUS

## 2021-04-30 MED ORDER — NITROGLYCERIN 1 MG/10 ML FOR IR/CATH LAB
INTRA_ARTERIAL | Status: AC
  Filled 2021-04-30: qty 10

## 2021-04-30 MED ORDER — ASPIRIN 81 MG PO CHEW
CHEWABLE_TABLET | ORAL | Status: AC
  Filled 2021-04-30: qty 3

## 2021-04-30 MED ORDER — FENTANYL CITRATE (PF) 100 MCG/2ML IJ SOLN
INTRAMUSCULAR | Status: DC | PRN
  Administered 2021-04-30: 25 ug via INTRAVENOUS

## 2021-04-30 MED ORDER — VERAPAMIL HCL 2.5 MG/ML IV SOLN
INTRAVENOUS | Status: DC | PRN
  Administered 2021-04-30: 2.5 mg via INTRAVENOUS

## 2021-04-30 MED ORDER — ALLOPURINOL 300 MG PO TABS
300.0000 mg | ORAL_TABLET | Freq: Every day | ORAL | Status: DC
  Administered 2021-05-01: 09:00:00 300 mg via ORAL
  Filled 2021-04-30 (×2): qty 1

## 2021-04-30 MED ORDER — SODIUM CHLORIDE 0.9% FLUSH
3.0000 mL | Freq: Two times a day (BID) | INTRAVENOUS | Status: DC
  Administered 2021-04-30 – 2021-05-01 (×2): 3 mL via INTRAVENOUS

## 2021-04-30 MED ORDER — HEPARIN (PORCINE) IN NACL 1000-0.9 UT/500ML-% IV SOLN
INTRAVENOUS | Status: DC | PRN
  Administered 2021-04-30 (×2): 500 mL

## 2021-04-30 MED ORDER — HEPARIN SODIUM (PORCINE) 1000 UNIT/ML IJ SOLN
INTRAMUSCULAR | Status: DC | PRN
  Administered 2021-04-30: 2000 [IU] via INTRAVENOUS
  Administered 2021-04-30: 4500 [IU] via INTRAVENOUS
  Administered 2021-04-30: 4000 [IU] via INTRAVENOUS

## 2021-04-30 MED ORDER — VERAPAMIL HCL 2.5 MG/ML IV SOLN
INTRAVENOUS | Status: AC
  Filled 2021-04-30: qty 2

## 2021-04-30 MED ORDER — IOHEXOL 300 MG/ML  SOLN
INTRAMUSCULAR | Status: DC | PRN
  Administered 2021-04-30: 14:00:00 95 mL

## 2021-04-30 SURGICAL SUPPLY — 23 items
BALLN TREK RX 2.5X12 (BALLOONS) ×4
BALLN ~~LOC~~ TREK RX 3.0X15 (BALLOONS)
BALLN ~~LOC~~ TREK RX 3.0X20 (BALLOONS) ×4
BALLOON TREK RX 2.5X12 (BALLOONS) IMPLANT
BALLOON ~~LOC~~ TREK RX 3.0X15 (BALLOONS) IMPLANT
BALLOON ~~LOC~~ TREK RX 3.0X20 (BALLOONS) IMPLANT
CATH 5F 110X4 TIG (CATHETERS) ×2 IMPLANT
CATH EXPO 5FR FR4 (CATHETERS) ×2 IMPLANT
CATH INFINITI 5FR JK (CATHETERS) ×2 IMPLANT
CATH VISTA GUIDE 6FR JR4 (CATHETERS) ×2 IMPLANT
DEVICE RAD TR BAND REGULAR (VASCULAR PRODUCTS) ×2 IMPLANT
DRAPE BRACHIAL (DRAPES) ×2 IMPLANT
GLIDESHEATH SLEND SS 6F .021 (SHEATH) ×2 IMPLANT
GUIDEWIRE INQWIRE 1.5J.035X260 (WIRE) IMPLANT
INQWIRE 1.5J .035X260CM (WIRE) ×4
KIT ENCORE 26 ADVANTAGE (KITS) ×2 IMPLANT
PACK CARDIAC CATH (CUSTOM PROCEDURE TRAY) ×4 IMPLANT
PROTECTION STATION PRESSURIZED (MISCELLANEOUS) ×4
SET ATX SIMPLICITY (MISCELLANEOUS) ×2 IMPLANT
STATION PROTECTION PRESSURIZED (MISCELLANEOUS) IMPLANT
STENT ONYX FRONTIER 2.75X30 (Permanent Stent) ×2 IMPLANT
TUBING CIL FLEX 10 FLL-RA (TUBING) ×2 IMPLANT
WIRE RUNTHROUGH .014X180CM (WIRE) ×2 IMPLANT

## 2021-04-30 NOTE — Interval H&P Note (Signed)
History and Physical Interval Note:  04/30/2021 1:19 PM  Kari [name]  has presented today for surgery, with the diagnosis of LT Cath   Abnormal coronary CT.  The various methods of treatment have been discussed with the patient and family. After consideration of risks, benefits and other options for treatment, the patient has consented to  Procedure(s): LEFT HEART CATH AND CORONARY ANGIOGRAPHY (Left) as a surgical intervention.  The patient's history has been reviewed, patient examined, no change in status, stable for surgery.  I have reviewed the patient's chart and labs.  Questions were answered to the patient's satisfaction.     Kathlyn Sacramento

## 2021-05-01 ENCOUNTER — Encounter: Payer: Self-pay | Admitting: Cardiovascular Disease

## 2021-05-01 DIAGNOSIS — R931 Abnormal findings on diagnostic imaging of heart and coronary circulation: Secondary | ICD-10-CM | POA: Diagnosis not present

## 2021-05-01 DIAGNOSIS — I200 Unstable angina: Secondary | ICD-10-CM | POA: Diagnosis not present

## 2021-05-01 DIAGNOSIS — I2510 Atherosclerotic heart disease of native coronary artery without angina pectoris: Secondary | ICD-10-CM

## 2021-05-01 DIAGNOSIS — Z9861 Coronary angioplasty status: Secondary | ICD-10-CM

## 2021-05-01 DIAGNOSIS — I25118 Atherosclerotic heart disease of native coronary artery with other forms of angina pectoris: Secondary | ICD-10-CM | POA: Diagnosis not present

## 2021-05-01 LAB — LIPID PANEL
Cholesterol: 133 mg/dL (ref 0–200)
HDL: 35 mg/dL — ABNORMAL LOW (ref >40)
LDL Cholesterol: 69 mg/dL (ref 0–99)
Total CHOL/HDL Ratio: 3.8 RATIO
Triglycerides: 144 mg/dL (ref <150)
VLDL: 29 mg/dL (ref 0–40)

## 2021-05-01 LAB — HEPATIC FUNCTION PANEL
ALT: 38 U/L (ref 0–44)
AST: 37 U/L (ref 15–41)
Albumin: 3.2 g/dL — ABNORMAL LOW (ref 3.5–5.0)
Alkaline Phosphatase: 74 U/L (ref 38–126)
Bilirubin, Direct: 0.2 mg/dL (ref 0.0–0.2)
Indirect Bilirubin: 0.7 mg/dL (ref 0.3–0.9)
Total Bilirubin: 0.9 mg/dL (ref 0.3–1.2)
Total Protein: 5.9 g/dL — ABNORMAL LOW (ref 6.5–8.1)

## 2021-05-01 LAB — BASIC METABOLIC PANEL
Anion gap: 5 (ref 5–15)
BUN: 12 mg/dL (ref 8–23)
CO2: 25 mmol/L (ref 22–32)
Calcium: 8.8 mg/dL — ABNORMAL LOW (ref 8.9–10.3)
Chloride: 103 mmol/L (ref 98–111)
Creatinine, Ser: 0.71 mg/dL (ref 0.44–1.00)
GFR, Estimated: >60 mL/min (ref >60)
Glucose, Bld: 103 mg/dL — ABNORMAL HIGH (ref 70–99)
Potassium: 3.9 mmol/L (ref 3.5–5.1)
Sodium: 133 mmol/L — ABNORMAL LOW (ref 135–145)

## 2021-05-01 LAB — CBC
HCT: 30 % — ABNORMAL LOW (ref 36.0–46.0)
Hemoglobin: 10.4 g/dL — ABNORMAL LOW (ref 12.0–15.0)
MCH: 31 pg (ref 26.0–34.0)
MCHC: 34.7 g/dL (ref 30.0–36.0)
MCV: 89.6 fL (ref 80.0–100.0)
Platelets: 220 10*3/uL (ref 150–400)
RBC: 3.35 MIL/uL — ABNORMAL LOW (ref 3.87–5.11)
RDW: 14.3 % (ref 11.5–15.5)
WBC: 8 10*3/uL (ref 4.0–10.5)
nRBC: 0 % (ref 0.0–0.2)

## 2021-05-01 MED ORDER — CLOPIDOGREL BISULFATE 75 MG PO TABS: 75.0000 mg | ORAL_TABLET | Freq: Every day | ORAL | 3 refills | Status: DC

## 2021-05-01 MED ORDER — PANTOPRAZOLE SODIUM 40 MG PO TBEC: 40.0000 mg | DELAYED_RELEASE_TABLET | Freq: Every day | ORAL | 3 refills | Status: DC

## 2021-05-01 MED ORDER — CLOPIDOGREL BISULFATE 75 MG PO TABS: 75.0000 mg | ORAL_TABLET | Freq: Every day | ORAL | 1 refills | Status: DC

## 2021-05-01 MED ORDER — PANTOPRAZOLE SODIUM 40 MG PO TBEC: 40.0000 mg | DELAYED_RELEASE_TABLET | Freq: Every day | ORAL | 1 refills | Status: DC

## 2021-05-01 NOTE — Progress Notes (Signed)
Discharge instructions reviewed with patient and husband at bedside. All questions answered at this time. Medications sent to mail in pharmacy [facility] [place] per MD Sharolyn Douglas. Personal belongings packed at bedside by patient. Husband to transport patient home via [place]. Patient instructed to limit right arm mobility due to cath insertion site. Patient assisted to medical mall in wheelchair by volunteer at this time.

## 2021-05-01 NOTE — Discharge Summary (Signed)
Discharge Summary    Patient ID: Devra Stare MRN: [id number]; DOB: 09/16/1945  Admit date: 04/30/2021 Discharge date: 05/01/2021  Primary Care Provider: [name], MD  Primary Cardiologist: Glenetta Hew, MD  Primary Electrophysiologist:  None   Discharge Diagnoses    Principal Problem:   Effort angina [facility])  **s/p PCI/DES to the RCA this admission.  Active Problems:   CAD (coronary artery disease)   Hyperlipidemia associated with type 2 diabetes mellitus ([place])   Essential hypertension   Class 3 severe obesity without serious comorbidity with body mass index (BMI) of 40.0 to 44.9 in adult [facility])   Diabetes mellitus with neuropathy (HCC)   Paroxysmal atrial fibrillation with rapid ventricular response (HCC); CHA2DS2-VASc score 5 (HTN, DM-2, Age, Female, Aortic Plaque)   Asthma   GERD   Osteoarthritis   Diagnostic Studies/Procedures    Cardiac Catheterization and Percutaneous Coronary Intervention 12.19.2022  Left Main  Vessel is angiographically normal.  Left Anterior Descending  There is mild diffuse disease throughout the vessel.  First Diagonal Branch  Vessel is angiographically normal.  Second Diagonal Branch  Vessel is angiographically normal.  Third Diagonal Branch  Vessel is angiographically normal.  Left Circumflex  The vessel exhibits minimal luminal irregularities.  First Obtuse Marginal Branch  Vessel is angiographically normal.  Second Obtuse Marginal Branch  Vessel is angiographically normal.  Third Obtuse Marginal Branch  Vessel is angiographically normal.  Right Coronary Artery  Prox RCA lesion is 99% stenosed. The lesion is type C. The lesion is mildly calcified.      **The proximal RCA was successfully treated with a 2.75 x 76mm Onyx Frontier DES**   _____________   History of Present Illness     Niki Payment is a 75 y.o. female with a h/o DMII, diabetic neuropathy, obesity, HL, Vit D deficiency, COPD/Ashtma, GERD, venous stasis,  lymphedema, gouty arthritis, OA, and recently diagnosed PAF.  She was recently seen in the [facility] ED for palpitations and finding of Afib w/ RVR.  She converted in the ED on IV diltiazem.  She was subsequently placed on oral ? blocker and eliquis and f/u in cardiology clinic on 04/11/2021.  With a h/o DOE, and chest pain in the setting of rapid heart rates, decision was made to pursue coronary CTA.  This was performed 04/19/2021, and revealed severe proximal RCA disease.  As a result, she was seen in follow-up and arrangements were made for diagnostic catheterization.  Hospital Course     Consultants: None   Ms. Rosenfield presented to the [facility] cardiac catheterization laboratory on 04/30/2021 and underwent diagnostic cath revealing a 99% proximal RCA stenosis with otherwise minimal, nonobstructive CAD.  The RCA was successfully treated using a 2.75 x 10mm Onyx Frontier drug-eluting stent.  Ms. Dina tolerated the procedure well, but did develop a hematoma at the R radial insertion site, post-procedure.  With additional compression, the area was well-managed, and this AM, the area is soft, though ecchymotic.  As such, she does have mild tenderness over the R wrist, but no evidence of active bleeding or bruit.  Ms. Parcell has had no chest pain or dyspnea overnight or this AM.  She has ambulated without difficulty and will be discharged home today in good condition.  We will resume eliquis this AM and discontinue ASA.  She will remain on plavix for the next 6 to 12 months.  Given initiation of plavix, we have discontinued her home dose of nexium in favor of pantoprazole.  Did the patient  have an acute coronary syndrome (MI, NSTEMI, STEMI, etc) this admission?:  No                               Did the patient have a percutaneous coronary intervention (stent / angioplasty)?:  Yes.     Cath/PCI Registry Performance & Quality Measures: Aspirin prescribed? - No - On eliquis ADP Receptor Inhibitor  (Plavix/Clopidogrel, Brilinta/Ticagrelor or Effient/Prasugrel) prescribed (includes medically managed patients)? - Yes High Intensity Statin (Lipitor 40-80mg  or Crestor 20-40mg ) prescribed? - No - Low dose statin w/ LDL @ goal For EF <40%, was ACEI/ARB prescribed? - Not Applicable (EF >/= 62%) For EF <40%, Aldosterone Antagonist (Spironolactone or Eplerenone) prescribed? - Not Applicable (EF >/= 83%) Cardiac Rehab Phase II ordered? - Yes  _____________  Physical Exam   Discharge Vitals Blood pressure (!) 165/58, pulse 66, temperature 98.5 F (36.9 C), resp. rate 16, height 5\' 4"  (1.626 m), weight 93 kg, SpO2 98 %.  Filed Weights   04/30/21 1137 04/30/21 2143  Weight: 93 kg 93 kg    GEN: Obese, in no acute distress.  HEENT: Grossly normal.  Neck: Supple, obese, difficult to gauge JVP. No carotid bruits or masses. Cardiac: RRR, no murmurs, rubs, or gallops. No clubbing, cyanosis, edema.  Radials/Ulnars/DP/PT 2+ and equal bilaterally. R radial cath site is ecchymotic w/o bleeding/bruit/hematoma. Respiratory:  Respirations regular and unlabored, clear to auscultation bilaterally. GI: Soft, nontender, nondistended, BS + x 4. MS: no deformity or atrophy. Skin: warm and dry, no rash. Neuro:  Strength and sensation are intact. Psych: AAOx3.  Normal affect.  Labs & Radiologic Studies    CBC Recent Labs    05/01/21 0505  WBC 8.0  HGB 10.4*  HCT 30.0*  MCV 89.6  PLT 662   Basic Metabolic Panel Recent Labs    05/01/21 0505  NA 133*  K 3.9  CL 103  CO2 25  GLUCOSE 103*  BUN 12  CREATININE 0.71  CALCIUM 8.8*   Liver Function Tests Recent Labs    05/01/21 0505  AST 37  ALT 38  ALKPHOS 74  BILITOT 0.9  PROT 5.9*  ALBUMIN 3.2*    Fasting Lipid Panel Recent Labs    05/01/21 0505  CHOL 133  HDL 35*  LDLCALC 69  TRIG 144  CHOLHDL 3.8   _____________   Disposition   Pt is being discharged home today in good condition.  Follow-up Plans & Appointments      Follow-up Information     Leonie Man, MD Follow up on 05/24/2021.   Specialty: Cardiology Why: 10:00 AM Contact information: [address] [place] [address] [phone]         Theora Gianotti, NP Follow up on 05/11/2021.   Specialties: Nurse Practitioner, Cardiology, Radiology Why: 2pm Contact information: [address] [address] [phone]                Discharge Instructions     AMB Referral to Cardiac Rehabilitation - Phase II   Complete by: As directed    Diagnosis: Coronary Stents   After initial evaluation and assessments completed: Virtual Based Care may be provided alone or in conjunction with Phase 2 Cardiac Rehab based on patient barriers.: Yes       Discharge Medications   Allergies as of 05/01/2021       Reactions   Nifedipine  REACTION: swollen feet        Medication List     STOP taking these medications    esomeprazole 20 MG capsule Commonly known as: [place] by: pantoprazole 40 MG tablet   Garlic 295 MG Tbec   vitamin E 1000 UNIT capsule       TAKE these medications    albuterol 108 (90 Base) MCG/ACT inhaler Commonly known as: VENTOLIN HFA Inhale 2 puffs by mouth into the lungs every 6 hours as needed for wheezing or shortness of breath   allopurinol 300 MG tablet Commonly known as: ZYLOPRIM Take 1 tablet (300 mg total) by mouth daily.   apixaban 5 MG Tabs tablet Commonly known as: Eliquis Take 1 tablet (5 mg total) by mouth 2 (two) times daily.   atorvastatin 10 MG tablet Commonly known as: LIPITOR Take 1 tablet (10 mg total) by mouth daily.   benzonatate 200 MG capsule Commonly known as: TESSALON Take 1 capsule (200 mg total) by mouth 3 (three) times daily as needed for cough.   calcium carbonate 600 MG tablet Commonly known as: OS-CAL Take 600 mg by mouth daily.   Centrum Minis Women 50+ Tabs Take 1 tablet by mouth daily.    cetirizine 10 MG tablet Commonly known as: ZYRTEC Take 10 mg by mouth daily.   Cinnamon 500 MG Tabs Take 500 mg by mouth daily.   clopidogrel 75 MG tablet Commonly known as: PLAVIX Take 1 tablet (75 mg total) by mouth daily with breakfast.   colchicine 0.6 MG tablet Take 1 tablet (0.6 mg total) by mouth 2 (two) times daily. As needed for gout flare.   diflorasone 0.05 % cream Commonly known as: PSORCON Apply 1 application topically 2 (two) times daily. What changed:  when to take this reasons to take this   Fish Oil 1000 MG Caps Take 1,000 mg by mouth daily.   fluticasone-salmeterol 250-50 MCG/ACT Aepb Commonly known as: Advair Diskus TAKE 1 PUFF BY MOUTH TWICE A DAY   furosemide 20 MG tablet Commonly known as: LASIX TAKE 1 TABLET BY MOUTH EVERY OTHER DAY What changed:  when to take this reasons to take this   hydrochlorothiazide 25 MG tablet Commonly known as: HYDRODIURIL Take 1 tablet (25 mg total) by mouth daily.   losartan 50 MG tablet Commonly known as: COZAAR Take 1 tablet (50 mg total) by mouth at bedtime.   Lutein 20 MG Tabs Take 20 mg by mouth daily.   Magnesium 250 MG Tabs Take 250 mg by mouth daily.   metoprolol tartrate 25 MG tablet Commonly known as: LOPRESSOR Take 1 tablet (25 mg total) by mouth 2 (two) times daily.   mirtazapine 45 MG tablet Commonly known as: REMERON Take 1 tablet (45 mg total) by mouth at bedtime.   montelukast 10 MG tablet Commonly known as: SINGULAIR Take 1 tablet (10 mg total) by mouth at bedtime.   OVER THE COUNTER MEDICATION Place 2 sprays into both nostrils at bedtime. Nasocort nasal spray   pantoprazole 40 MG tablet Commonly known as: PROTONIX Take 1 tablet (40 mg total) by mouth daily. Replaces: esomeprazole 20 MG capsule   PROBIOTIC PO Take 1 tablet by mouth daily.   traZODone 50 MG tablet Commonly known as: DESYREL Take 0.5-1 tablets (25-50 mg total) by mouth at bedtime as needed for sleep.    triamcinolone lotion 0.1 % Commonly known as: KENALOG Apply to affected area(s) twice a day What changed:  how much to take when to  take this reasons to take this additional instructions         Outstanding Labs/Studies   None  Duration of Discharge Encounter   Greater than 30 minutes including physician time.  Signed, Murray Hodgkins, NP 05/01/2021, 8:30 AM

## 2021-05-01 NOTE — Discharge Instructions (Signed)

## 2021-05-02 ENCOUNTER — Other Ambulatory Visit: Payer: Self-pay | Admitting: Cardiology

## 2021-05-02 ENCOUNTER — Other Ambulatory Visit: Payer: Self-pay | Admitting: Internal Medicine

## 2021-05-02 DIAGNOSIS — M109 Gout, unspecified: Secondary | ICD-10-CM

## 2021-05-04 ENCOUNTER — Ambulatory Visit (INDEPENDENT_AMBULATORY_CARE_PROVIDER_SITE_OTHER): Payer: PPO

## 2021-05-04 ENCOUNTER — Other Ambulatory Visit: Payer: Self-pay

## 2021-05-04 DIAGNOSIS — I2510 Atherosclerotic heart disease of native coronary artery without angina pectoris: Secondary | ICD-10-CM | POA: Diagnosis not present

## 2021-05-04 DIAGNOSIS — I4891 Unspecified atrial fibrillation: Secondary | ICD-10-CM

## 2021-05-04 DIAGNOSIS — I2089 Other forms of angina pectoris: Secondary | ICD-10-CM

## 2021-05-04 DIAGNOSIS — I208 Other forms of angina pectoris: Secondary | ICD-10-CM

## 2021-05-04 LAB — ECHOCARDIOGRAM COMPLETE
AR max vel: 2.51 cm2
AV Area VTI: 2.71 cm2
AV Area mean vel: 2.63 cm2
AV Mean grad: 2.5 mmHg
AV Peak grad: 4.6 mmHg
Ao pk vel: 1.08 m/s
Area-P 1/2: 2.44 cm2
Calc EF: 56 %
S' Lateral: 3.55 cm
Single Plane A2C EF: 56 %
Single Plane A4C EF: 56.5 %

## 2021-05-09 ENCOUNTER — Telehealth: Payer: Self-pay

## 2021-05-09 NOTE — Telephone Encounter (Signed)
Attempted to reach out to pt to review ECHO results Unable to make contact via phone LMTCB

## 2021-05-09 NOTE — Telephone Encounter (Signed)
Patient returning call.

## 2021-05-09 NOTE — Telephone Encounter (Signed)
Was able to return call to Kari [name] regarding her recent ECHO, Dr. Ellyn Hack had a chance to review her results and advised   "Echocardiogram result: Normal pump function with ejection fraction of 60 to 65%.  (Normal range is 50 to 70%.).  Mildly reduced relaxation/diastolic function-normal for age.  No significant valve disease.   Overall normal study.   Kari Hew, MD "  Kari [name] very thankful for the phone call of her results, all questions and concerns were address with nothing further at this time. Will see at next schedule f/u appt.

## 2021-05-11 ENCOUNTER — Other Ambulatory Visit: Payer: Self-pay

## 2021-05-11 ENCOUNTER — Encounter: Payer: Self-pay | Admitting: Nurse Practitioner

## 2021-05-11 ENCOUNTER — Ambulatory Visit: Payer: PPO | Admitting: Nurse Practitioner

## 2021-05-11 VITALS — BP 120/60 | HR 58 | Ht 64.0 in | Wt 205.4 lb

## 2021-05-11 DIAGNOSIS — I480 Paroxysmal atrial fibrillation: Secondary | ICD-10-CM | POA: Diagnosis not present

## 2021-05-11 DIAGNOSIS — I2510 Atherosclerotic heart disease of native coronary artery without angina pectoris: Secondary | ICD-10-CM | POA: Diagnosis not present

## 2021-05-11 DIAGNOSIS — R040 Epistaxis: Secondary | ICD-10-CM | POA: Diagnosis not present

## 2021-05-11 DIAGNOSIS — E785 Hyperlipidemia, unspecified: Secondary | ICD-10-CM

## 2021-05-11 MED ORDER — APIXABAN 5 MG PO TABS
5.0000 mg | ORAL_TABLET | Freq: Two times a day (BID) | ORAL | 3 refills | Status: DC
Start: 2021-05-11 — End: 2021-06-28

## 2021-05-11 NOTE — Patient Instructions (Addendum)
Medication Instructions:  No changes at this time.  *If you need a refill on your cardiac medications before your next appointment, please call your pharmacy*   Lab Work: CBC today  If you have labs (blood work) drawn today and your tests are completely normal, you will receive your results only by: [place] (if you have MyChart) OR A paper copy in the mail If you have any lab test that is abnormal or we need to change your treatment, we will call you to review the results.   Testing/Procedures: None   Follow-Up: At [facility], you and your health needs are our priority.  As part of our continuing mission to provide you with exceptional heart care, we have created designated Provider Care Teams.  These Care Teams include your primary Cardiologist (physician) and Advanced Practice Providers (APPs -  Physician Assistants and Nurse Practitioners) who all work together to provide you with the care you need, when you need it.   Your next appointment:   6 week(s)  The format for your next appointment:   In Person  Provider:   Glenetta Hew, MD or Murray Hodgkins, NP

## 2021-05-11 NOTE — Progress Notes (Signed)
Blood   Office Visit    Patient Name: Kari [name] Date of Encounter: 05/11/2021  Primary Care Provider:  [name], MD Primary Cardiologist:  Glenetta Hew, MD  Chief Complaint    75 year old female with a history of diet-controlled type 2 diabetes mellitus, diabetic neuropathy, obesity, hyperlipidemia, vitamin D deficiency, COPD, asthma, GERD, venous stasis, lymphedema, gouty arthritis, osteoarthritis, and recently diagnosed paroxysmal atrial fibrillation, who presents for follow-up after recent diagnostic catheterization and stenting of the RCA.  Past Medical History    Past Medical History:  Diagnosis Date   Allergy    to cat, dogs, pollen, dust, cigarette smoke   Arthritis    feet   Cataract    removed both eyes   COPD (chronic obstructive pulmonary disease) with chronic bronchitis (HCC)    And moderate persistent asthma; complicated by Restrictive Lung Disease from obesity   Coronary artery disease involving native coronary artery of native heart with other form of angina pectoris ([place])    a. 04/2021 Cor CTA: Cor Ca2+ 389. Domninant RCA-RPDA<PLA. RCA >70p (abnl CTFFR), LAD 50-69p, LCX Min dz; b. 04/2021 Cath/PCI: LM nl, LAD mild diff dzs, D1/2/3 nl, LCX min irregs, OM1/2/3 nl, RCA 99p (2.75x30 Onyx Frontier DES).   Depression    Diastolic dysfunction    a. 04/2021 Echo: EF 60-65%, GrI DD. Nl RV fxn. Mildly dil LA.   GERD (gastroesophageal reflux disease)    Hx of adenomatous colonic polyps 05/30/2010   Hyperlipidemia    Hypertension    Lymphedema of both lower extremities    No superficial venous reflux noted.  However right-sided saphenofemoral junction reflux noted and left common femoral vein reflux noted.  No DVT or superficial thrombus.   Menopause    Migraines    Miscarriage    X 1   Moderate persistent asthma    PONV (postoperative nausea and vomiting)    Past Surgical History:  Procedure Laterality Date   CATARACT EXTRACTION, BILATERAL     CESAREAN  SECTION     X 2   COLONOSCOPY     CORONARY STENT INTERVENTION N/A 04/30/2021   Procedure: CORONARY STENT INTERVENTION;  Surgeon: Wellington Hampshire, MD;  Location: [facility];  Service: Cardiovascular;  Laterality: N/A;   DENTAL SURGERY  09/2016   LEFT HEART CATH AND CORONARY ANGIOGRAPHY Left 04/30/2021   Procedure: LEFT HEART CATH AND CORONARY ANGIOGRAPHY;  Surgeon: Wellington Hampshire, MD;  Location: [facility];  Service: Cardiovascular;  Laterality: Left;   POLYPECTOMY     TRANSTHORACIC ECHOCARDIOGRAM  11/04/2019   EF 55 to 60%.  Normal LV size and function.  GR 1 DD.  Unable to assess.  Mild LA dilation.  Normal aortic and mitral valve.  Normal RVP.  (Normal study)   TUBAL LIGATION  1980    Allergies  Allergies  Allergen Reactions   Nifedipine     REACTION: swollen feet    History of Present Illness    75 year old female with the above past medical history including type 2 diabetes mellitus, diabetic neuropathy, obesity, hyperlipidemia, vitamin D deficiency, COPD, asthma, GERD, venous stasis, lymphedema, gouty arthritis, osteoarthritis, and paroxysmal atrial fibrillation.  She was recently seen in the [facility] ED for palpitations and finding of A. fib with RVR.  She converted in the emergency department on IV diltiazem.  She was subsequently placed on oral beta-blocker and Eliquis and followed up in cardiology clinic on April 11, 2021.  With a history of dyspnea on  exertion and chest pain in the setting of rapid heart rates, decision made to pursue coronary CTA.  This was performed April 19, 2021 and revealed severe proximal RCA disease.  She subsequently underwent diagnostic catheterization on April 30, 2021 revealing a 99% stenosis in the proximal RCA with otherwise minimal, nonobstructive CAD.  The RCA was treated with a 2.75 x 30 mm Onyx frontier drug-eluting stent.  She developed a right radial hematoma though this was overall stable and managed with compression  postprocedure.  She was discharged home December 20 on Eliquis and Plavix.  She just underwent echocardiogram on December 23 revealing an EF of 60 to 65% with grade 1 diastolic dysfunction.  Since discharge, she has done well from a cardiac standpoint.  She has a history of right upper arm/shoulder pain and in the setting of trying to immobilize her right lower arm post PCI, she has had some right upper arm discomfort, that is especially notable at night, sometimes preventing her from sleeping.  She has tried Tylenol without much relief.  She has also been having some nasal congestion with change secretions at times.  On 1 occasion, she had some blood clots coming from her left nare.  She uses humidification in her home and bedroom.  We discussed the use of nasal saline.  She has not had any frank epistaxis.  She denies chest pain, dyspnea, palpitations, PND, orthopnea, dizziness, syncope, or early satiety.  She sometimes notes lower extremity swelling above her sock line, which is chronic.  She had not considered cardiac rehabilitation, but is interested.  Home Medications    Current Outpatient Medications  Medication Sig Dispense Refill   albuterol (VENTOLIN HFA) 108 (90 Base) MCG/ACT inhaler Inhale 2 puffs by mouth into the lungs every 6 hours as needed for wheezing or shortness of breath 8.5 g 0   allopurinol (ZYLOPRIM) 300 MG tablet Take 1 tablet by mouth daily 90 tablet 0   atorvastatin (LIPITOR) 10 MG tablet Take 1 tablet by mouth daily 90 tablet 0   benzonatate (TESSALON) 200 MG capsule Take 1 capsule (200 mg total) by mouth 3 (three) times daily as needed for cough. 60 capsule 1   calcium carbonate (OS-CAL) 600 MG tablet Take 600 mg by mouth daily.     cetirizine (ZYRTEC) 10 MG tablet Take 10 mg by mouth daily.     Cinnamon 500 MG TABS Take 500 mg by mouth daily.     clopidogrel (PLAVIX) 75 MG tablet Take 1 tablet (75 mg total) by mouth daily with breakfast. 90 tablet 3   colchicine 0.6 MG  tablet Take 1 tablet (0.6 mg total) by mouth 2 (two) times daily. As needed for gout flare. 60 tablet 2   diflorasone (PSORCON) 0.05 % cream Apply 1 application topically 2 (two) times daily. (Patient taking differently: Apply 1 application topically 2 (two) times daily as needed (dry skin).) 30 g 5   fluticasone-salmeterol (ADVAIR DISKUS) 250-50 MCG/ACT AEPB TAKE 1 PUFF BY MOUTH TWICE A DAY 60 each 3   furosemide (LASIX) 20 MG tablet TAKE 1 TABLET BY MOUTH EVERY OTHER DAY (Patient taking differently: Take 20 mg by mouth daily as needed for edema.) 45 tablet 3   hydrochlorothiazide (HYDRODIURIL) 25 MG tablet Take 1 tablet by mouth daily 90 tablet 0   losartan (COZAAR) 50 MG tablet Take 1 tablet by mouth at bedtime 90 tablet 0   Lutein 20 MG TABS Take 20 mg by mouth daily.     Magnesium  250 MG TABS Take 250 mg by mouth daily.     metoprolol tartrate (LOPRESSOR) 25 MG tablet Take 1 tablet by mouth twice a day 180 tablet 0   mirtazapine (REMERON) 45 MG tablet Take 1 tablet (45 mg total) by mouth at bedtime. 90 tablet 1   montelukast (SINGULAIR) 10 MG tablet Take 1 tablet by mouth at bedtime 90 tablet 0   Multiple Vitamins-Minerals (CENTRUM MINIS WOMEN 50+) TABS Take 1 tablet by mouth daily.     Omega-3 Fatty Acids (FISH OIL) 1000 MG CAPS Take 1,000 mg by mouth daily.     OVER THE COUNTER MEDICATION Place 2 sprays into both nostrils at bedtime. Nasocort nasal spray     pantoprazole (PROTONIX) 40 MG tablet Take 1 tablet (40 mg total) by mouth daily. 90 tablet 3   Probiotic Product (PROBIOTIC PO) Take 1 tablet by mouth daily.     traZODone (DESYREL) 50 MG tablet Take 0.5-1 tablets (25-50 mg total) by mouth at bedtime as needed for sleep. 90 tablet 1   triamcinolone lotion (KENALOG) 0.1 % Apply to affected area(s) twice a day (Patient taking differently: 1 application 2 (two) times daily as needed (dry scalp).) 180 mL 0   apixaban (ELIQUIS) 5 MG TABS tablet Take 1 tablet (5 mg total) by mouth 2 (two)  times daily. 60 tablet 3   No current facility-administered medications for this visit.     Review of Systems    Some blood-tinged nasal secretions from her left nare with blood clots on 1 occasion.  No frank bleeding.  Chronic intermittent right shoulder discomfort which has been limiting her from sleeping well.  She denies chest pain, dyspnea, palpitations, PND, orthopnea, dizziness, syncope, or early satiety.  Occasionally notes edema at her sock line.  All other systems reviewed and are otherwise negative except as noted above.   Cardiac Rehabilitation Eligibility Assessment  The patient is ready to start cardiac rehabilitation from a cardiac standpoint.    Physical Exam    VS:  BP 120/60 (BP Location: Left Arm, Patient Position: Sitting, Cuff Size: Normal)    Pulse (!) 58    Ht 5\' 4"  (1.626 m)    Wt 205 lb 6 oz (93.2 kg)    SpO2 98%    BMI 35.25 kg/m  , BMI Body mass index is 35.25 kg/m.     GEN: Well nourished, well developed, in no acute distress. HEENT: normal. Neck: Supple, no JVD, carotid bruits, or masses. Cardiac: RRR, no murmurs, rubs, or gallops. No clubbing, cyanosis, trace bilateral ankle edema.  Radials/DP/PT 2+ and equal bilaterally.  Right radial catheterization site is ecchymotic without bleeding, bruit, or hematoma. Respiratory:  Respirations regular and unlabored, clear to auscultation bilaterally. GI: Soft, nontender, nondistended, BS + x 4. MS: no deformity or atrophy. Skin: warm and dry, no rash. Neuro:  Strength and sensation are intact. Psych: Normal affect.  Accessory Clinical Findings    ECG personally reviewed by me today -sinus bradycardia, 58, first-degree AV block, left axis, delayed R wave progression- no acute changes.  Lab Results  Component Value Date   WBC 8.0 05/01/2021   HGB 10.4 (L) 05/01/2021   HCT 30.0 (L) 05/01/2021   MCV 89.6 05/01/2021   PLT 220 05/01/2021   Lab Results  Component Value Date   CREATININE 0.71 05/01/2021   BUN  12 05/01/2021   NA 133 (L) 05/01/2021   K 3.9 05/01/2021   CL 103 05/01/2021   CO2 25 05/01/2021  Lab Results  Component Value Date   ALT 38 05/01/2021   AST 37 05/01/2021   ALKPHOS 74 05/01/2021   BILITOT 0.9 05/01/2021   Lab Results  Component Value Date   CHOL 133 05/01/2021   HDL 35 (L) 05/01/2021   LDLCALC 69 05/01/2021   LDLDIRECT 92.0 02/18/2019   TRIG 144 05/01/2021   CHOLHDL 3.8 05/01/2021    Lab Results  Component Value Date   HGBA1C 5.8 04/16/2021    Assessment & Plan    1.  Coronary artery disease: Status post recent coronary CTA suggestive of severe RCA disease followed by diagnostic catheterization, which confirmed severe, proximal 99% RCA stenosis and otherwise nonobstructive disease.  The proximal RCA was successfully treated with drug-eluting stent.  She is now on Plavix and Eliquis (recent diagnosis of paroxysmal atrial fibrillation).  She has not had any chest pain or dyspnea.  Her right radial catheterization site is ecchymotic but otherwise healing well.  She has had some blood-tinged nasal secretions and blood clots from her left nare.  We discussed the use of humidification as well as nasal saline.  I will follow-up a CBC today.  Continue Plavix, statin, beta-blocker, ARB.  Patient is interested in cardiac rehabilitation and is ready to start from my standpoint.  2.  Paroxysmal atrial fibrillation: Quiescent on beta-blocker.  She remains on Eliquis therapy.  Follow-up CBC today given recent initiation of Plavix.  3.  Hyperlipidemia: LDL of 69 earlier this month.  Continue statin therapy.  4.  Type 2 diabetes mellitus: Diet controlled.  A1c of 5.8 earlier this month.  5.  Chronic lymphedema: Only trace lower extremity swelling.  She is on HCTZ daily and uses Lasix every other day.  6.  Epistaxis/blood-tinged nasal secretions: Patient with some blood-tinged nasal secretions and blood clots from her left nare the other day.  She does have a history of COPD  and asthma and has noted some increase in secretions recently.  She uses humidification at home.  Blood-tinged secretions new since initiating Plavix on top of Eliquis.  No frank nasal bleeding.  Encouraged use of nasal saline and ongoing use of humidification.  I will follow-up a CBC today.  She will continue to monitor secretions at home.  7.  Morbid obesity: We will benefit from cardiac rehabilitation and is interested in participating.  8.  Disposition: Follow-up CBC today and follow-up in clinic in 4 to 6 weeks or sooner if necessary.   Murray Hodgkins, NP 05/11/2021, 4:32 PM

## 2021-05-12 LAB — CBC
Hematocrit: 31.5 % — ABNORMAL LOW (ref 34.0–46.6)
Hemoglobin: 10.9 g/dL — ABNORMAL LOW (ref 11.1–15.9)
MCH: 31.4 pg (ref 26.6–33.0)
MCHC: 34.6 g/dL (ref 31.5–35.7)
MCV: 91 fL (ref 79–97)
Platelets: 279 10*3/uL (ref 150–450)
RBC: 3.47 x10E6/uL — ABNORMAL LOW (ref 3.77–5.28)
RDW: 14.9 % (ref 11.7–15.4)
WBC: 9.9 10*3/uL (ref 3.4–10.8)

## 2021-05-24 ENCOUNTER — Ambulatory Visit: Payer: PPO | Admitting: Cardiology

## 2021-06-01 ENCOUNTER — Telehealth: Payer: Self-pay

## 2021-06-01 NOTE — Telephone Encounter (Signed)
Transition Care Management Unsuccessful Follow-up Telephone Call  Date of discharge and from where:  05/01/2021  [facility]  Attempts:  1st Attempt  Reason for unsuccessful TCM follow-up call:  No answer/busy Tomasa Rand, RN, BSN, CEN [place] Coordinator [phone]

## 2021-06-04 ENCOUNTER — Telehealth: Payer: Self-pay

## 2021-06-04 NOTE — Telephone Encounter (Signed)
Transition Care Management Unsuccessful Follow-up Telephone Call  Date of discharge and from where:  05/01/2021  [facility]  Attempts:  2nd Attempt  Reason for unsuccessful TCM follow-up call:  No answer/busy Tomasa Rand, RN, BSN, CEN [place] Coordinator [phone]

## 2021-06-11 ENCOUNTER — Other Ambulatory Visit: Payer: Self-pay | Admitting: Internal Medicine

## 2021-06-12 ENCOUNTER — Other Ambulatory Visit: Payer: Self-pay

## 2021-06-12 ENCOUNTER — Encounter: Payer: Self-pay | Admitting: Internal Medicine

## 2021-06-12 MED ORDER — TRAZODONE HCL 50 MG PO TABS
25.0000 mg | ORAL_TABLET | Freq: Every evening | ORAL | 1 refills | Status: DC | PRN
Start: 2021-06-12 — End: 2022-08-21

## 2021-06-12 MED ORDER — MIRTAZAPINE 45 MG PO TABS: 45.0000 mg | ORAL_TABLET | Freq: Every day | ORAL | 1 refills | Status: DC

## 2021-06-25 ENCOUNTER — Encounter: Payer: Self-pay | Admitting: Internal Medicine

## 2021-06-25 MED ORDER — FLUTICASONE-SALMETEROL 250-50 MCG/ACT IN AEPB: INHALATION_SPRAY | RESPIRATORY_TRACT | 3 refills | Status: DC

## 2021-06-28 ENCOUNTER — Encounter: Payer: Self-pay | Admitting: Cardiology

## 2021-06-28 ENCOUNTER — Ambulatory Visit: Payer: PPO | Admitting: Cardiology

## 2021-06-28 ENCOUNTER — Other Ambulatory Visit: Payer: Self-pay

## 2021-06-28 VITALS — BP 120/64 | HR 61 | Ht 64.0 in | Wt 200.0 lb

## 2021-06-28 DIAGNOSIS — Z9861 Coronary angioplasty status: Secondary | ICD-10-CM

## 2021-06-28 DIAGNOSIS — E785 Hyperlipidemia, unspecified: Secondary | ICD-10-CM | POA: Diagnosis not present

## 2021-06-28 DIAGNOSIS — D6869 Other thrombophilia: Secondary | ICD-10-CM | POA: Diagnosis not present

## 2021-06-28 DIAGNOSIS — I2510 Atherosclerotic heart disease of native coronary artery without angina pectoris: Secondary | ICD-10-CM

## 2021-06-28 DIAGNOSIS — I10 Essential (primary) hypertension: Secondary | ICD-10-CM | POA: Diagnosis not present

## 2021-06-28 DIAGNOSIS — E66813 Obesity, class 3: Secondary | ICD-10-CM

## 2021-06-28 DIAGNOSIS — I480 Paroxysmal atrial fibrillation: Secondary | ICD-10-CM

## 2021-06-28 DIAGNOSIS — Z6841 Body Mass Index (BMI) 40.0 and over, adult: Secondary | ICD-10-CM

## 2021-06-28 DIAGNOSIS — E1169 Type 2 diabetes mellitus with other specified complication: Secondary | ICD-10-CM | POA: Diagnosis not present

## 2021-06-28 MED ORDER — APIXABAN 5 MG PO TABS: 5.0000 mg | ORAL_TABLET | Freq: Two times a day (BID) | ORAL | 0 refills | Status: DC

## 2021-06-28 NOTE — Progress Notes (Signed)
Primary Care Provider: [name], MD Cardiologist: Glenetta Hew, MD Electrophysiologist: None  Clinic Note: Chief Complaint  Patient presents with   Follow-up    6 week F/U   Atrial Fibrillation    No recurrent spells   Coronary Artery Disease    No angina    ===================================  ASSESSMENT/PLAN   Problem List Items Addressed This Visit       Cardiology Problems   Paroxysmal atrial fibrillation with rapid ventricular response ([place]); CHA2DS2-VASc score 5 (HTN, DM-2, Age, Female, Aortic Plaque) - Primary (Chronic)    Thankfully, no breakthrough spells.  Rate controlled on low-dose Lopressor.  On Eliquis 5 mg twice daily with no significant bleeding just some mild nosebleeds.  Hemoglobin level stable.  We will also need to consider sleep study.  Plan: Continue Lopressor and Eliquis.  Will likely DC Plavix after 6 months post-PCI.      Relevant Medications   furosemide (LASIX) 20 MG tablet   apixaban (ELIQUIS) 5 MG TABS tablet   Other Relevant Orders   EKG 12-Lead (Completed)   CAD S/P percutaneous coronary angioplasty (Chronic)    Thankfully, not having any chest pain.  As it was that she only had chest discomfort and dyspnea in the setting of A-fib RVR.  Now status post PCI to the RCA with moderate disease in the LAD.  Plan: Continue beta-blocker and ARB along with statin. Uninterrupted Plavix to complete 6 months post PCI and then go to Eliquis alone.      Relevant Medications   furosemide (LASIX) 20 MG tablet   apixaban (ELIQUIS) 5 MG TABS tablet   Hyperlipidemia associated with type 2 diabetes mellitus (HCC) (Chronic)    Most recent lipids showed LDL 69.  Just under the target of 170.  On low-dose atorvastatin.  Monitor closely.  Low threshold to increase dose.   Thankfully, A1c was 5.8.  Remains diet controlled.  Not currently on medication.  Low threshold to consider GLP-1 agonist for glycemic control and weight loss.       Relevant Medications   furosemide (LASIX) 20 MG tablet   apixaban (ELIQUIS) 5 MG TABS tablet   Hypercoagulable state due to paroxysmal atrial fibrillation (HCC) (Chronic)    Remains on Eliquis.  Doing well.  Had some mild nosebleeds but stabilized now.  Using humidifier.  CHA2DS2-VASc score is at a minimum of 5 (HTN, DM-2, age, female, aortic plaque/CAD)-> currently on Eliquis along with clopidogrel for stent.  Plan to continue uninterrupted Plavix/clopidogrel for 6 months which would be end of May 2023.  At that time would discontinue clopidogrel and continue Eliquis alone. For urgent/emergent procedures, okay to hold both Eliquis and clopidogrel as of August 11, 2021.  (Clopidogrel for 5 to 7 days preop, Eliquis 2 to 3 days preop without bridge) Would prefer to maintain uninterrupted clopidogrel until November 10, 2021      Relevant Medications   furosemide (LASIX) 20 MG tablet   apixaban (ELIQUIS) 5 MG TABS tablet   Essential hypertension (Chronic)    Stable blood pressure current meds.  Taking low-dose beta-blocker, moderate dose losartan and HCTZ. Marland Kitchen No change.      Relevant Medications   furosemide (LASIX) 20 MG tablet   apixaban (ELIQUIS) 5 MG TABS tablet     Other   Class 3 severe obesity without serious comorbidity with body mass index (BMI) of 40.0 to 44.9 in adult [facility]) (Chronic)    The patient understands the need to lose weight with  diet and exercise. We have discussed specific strategies for this.  Low threshold to consider GLP-1 agonist due to borderline diabetes (diet-controlled), CAD and obesity.       ===================================  HPI:    Kari [name] is a 76 y.o. female with a PMH notable for PAF and CAD-PCI, along with COPD, DM-2 (diet-controlled, with neuropathy), HLD, Chronic Venous Stasis w/  Lymphedema who presents today for 58-month follow-up.  CV History: November 2022: ER visit for A-fib RVR; spontaneously converted with IV fluids and diltiazem  drip. => Noted chest pain and dyspnea associated with tachycardia Initial Consultation 04/11/2021: Evaluate with 2D Echo and Coronary CTA  1 V CAD:  Coronary CTA showed FFR positive proximal RCA stenosis (FFR ct 0.65) with moderate LAD (50 to 69%) and minimal (less than 24%) LCx disease. => Referred for Cardiac Cath-PCI 04/20/2021 Cath-PCI 99% prox RCA => DES PCI (2.75 mm x 30 mm ONYX FRONTIER DES)  Recent Hospitalizations:  04/30/2021-cardiac cath-PCI  Kari [name] was last seen on 05/11/2021 by Murray Hodgkins, NP for post-cath-PCI follow-up.  She noted doing well since discharge.She initially noted some mild right shoulder and upper arm pain from immobilization, moving the arm to the right.  Treating with Tylenol.  Took a while for this to get better. Noted some mild epistaxis/blood clots with blowing her nose and congestion.  Using humidifier.  No frank epistaxis.  No chest pain pressure or dyspnea.  No further recurrence of A-fib palpitations.Chronic edema well controlled.  Not interested in cardiac rehab. Check CBC.   Reviewed  CV studies:    The following studies were reviewed today: (if available, images/films reviewed: From Epic Chart or Care Everywhere) Cardiac Cath-PCI 04/30/2021: Proximal RCA 99% => DES PCI with Onyx Frontier 2.75 mm x 30 mm; mild diffuse LAD and minimal diffuse LCx disease.  Moderately elevated EDP (22 mmHg).  EF 55-65 %. Echo 05/04/2021: Normal LV size and function.  EF 60 to 65%.  No RWMA.  GR 1 DD.Normal RV size and function.  Normal aortic and mitral valves.  Interval History:   Kari [name] returns for close follow-up .  No significant nosebleeds.  Blood counts are stable.  She says that she has not had any further breakthrough episodes of irregular heartbeats palpitations.  No chest pain or pressure with rest or exertion.  No PND, or orthopnea.  Her venous stasis is pretty stable.  No significant edema.  Rarely uses her as needed Lasix.  Pretty much well  controlled with the HCTZ.  CV Review of Symptoms (Summary) Cardiovascular ROS: positive for - - Mild exertional dyspnea related to deconditioning, stable extremity swelling. negative for - chest pain, dyspnea on exertion, irregular heartbeat, orthopnea, palpitations, paroxysmal nocturnal dyspnea, rapid heart rate, shortness of breath, or Lightheadedness, dizziness or wooziness, syncope/near syncope TIA/amaurosis fugax, claudication  REVIEWED OF SYSTEMS   Review of Systems  Constitutional:  Negative for malaise/fatigue (Somewhat sedentary) and weight loss.  HENT:  Positive for nosebleeds (Seems to have cleared up). Negative for congestion.   Respiratory:  Negative for shortness of breath.   Cardiovascular:  Positive for leg swelling (Stable).       Per HPI  Gastrointestinal:  Negative for blood in stool.  Genitourinary:  Negative for dysuria and hematuria.  Musculoskeletal:  Negative for falls, joint pain and myalgias.  Neurological:  Negative for dizziness, focal weakness and weakness.  Endo/Heme/Allergies:  Does not bruise/bleed easily.  Psychiatric/Behavioral: Negative.     I have reviewed and (if needed) personally updated  the patient's problem list, medications, allergies, past medical and surgical history, social and family history.   PAST MEDICAL HISTORY   Past Medical History:  Diagnosis Date   Allergy    to cat, dogs, pollen, dust, cigarette smoke   Arthritis    feet   Cataract    removed both eyes   COPD (chronic obstructive pulmonary disease) with chronic bronchitis (HCC)    And moderate persistent asthma; complicated by Restrictive Lung Disease from obesity   Coronary artery disease involving native coronary artery of native heart with other form of angina pectoris ([place])    a. 04/2021 Cor CTA: Cor Ca2+ 389. Domninant RCA-RPDA<PLA. RCA >70p (abnl CTFFR), LAD 50-69p, LCX Min dz; b. 04/2021 Cath/PCI: LM nl, LAD mild diff dzs, D1/2/3 nl, LCX min irregs, OM1/2/3 nl, RCA 99p  (2.75x30 Onyx Frontier DES).   Depression    Diastolic dysfunction    a. 04/2021 Echo: EF 60-65%, GrI DD. Nl RV fxn. Mildly dil LA.   GERD (gastroesophageal reflux disease)    Hx of adenomatous colonic polyps 05/30/2010   Hyperlipidemia    Hypertension    Lymphedema of both lower extremities    No superficial venous reflux noted.  However right-sided saphenofemoral junction reflux noted and left common femoral vein reflux noted.  No DVT or superficial thrombus.   Menopause    Migraines    Miscarriage    X 1   Moderate persistent asthma    PONV (postoperative nausea and vomiting)     PAST SURGICAL HISTORY   Past Surgical History:  Procedure Laterality Date   CATARACT EXTRACTION, BILATERAL     CESAREAN SECTION     X 2   COLONOSCOPY     CORONARY STENT INTERVENTION N/A 04/30/2021   Procedure: CORONARY STENT INTERVENTION;  Surgeon: Wellington Hampshire, MD;  Location: [facility];  Service: Cardiovascular;  Proximal RCA 99% => DES PCI with Onyx Frontier 2.75 mm x 30 mm   DENTAL SURGERY  09/2016   LEFT HEART CATH AND CORONARY ANGIOGRAPHY Left 04/30/2021   Procedure: LEFT HEART CATH AND CORONARY ANGIOGRAPHY;  Surgeon: Wellington Hampshire, MD;  Location: [facility];  Service: Cardiovascular;; Proximal RCA 99% => DES PCI;  [place]; mild diffuse LAD and minimal diffuse LCx disease.  Moderately elevated EDP (22 mmHg).  EF 55-65 %.   POLYPECTOMY     TRANSTHORACIC ECHOCARDIOGRAM  11/04/2019   a) EF 55 to 60%.  Normal LV size and function.  GR 1 DD.  Unable to assess.  Mild LA dilation.  Normal aortic and mitral valve.  Normal RVP.  (Normal study);; b) 04/2021: Normal LV size and function.  EF 60 to 65%.  No RWMA.  GR 1 DD.Normal RV size and function.  Normal aortic and mitral valves.   TUBAL LIGATION  1980    Immunization History  Administered Date(s) Administered   Fluad Quad(high Dose 65+) 02/28/2020   Influenza, High Dose Seasonal PF 02/24/2018, 03/15/2019    Influenza,inj,Quad PF,6+ Mos 03/03/2013   Influenza-Unspecified 01/11/2014, 02/13/2015, 02/27/2016, 03/04/2017, 02/25/2021   Moderna Covid-19 Vaccine Bivalent Booster 8yrs & up 02/25/2021   PFIZER Comirnaty(Gray Top)Covid-19 Tri-Sucrose Vaccine 11/09/2020   PFIZER(Purple Top)SARS-COV-2 Vaccination 07/02/2019, 07/23/2019, 02/28/2020   Pneumococcal Conjugate-13 03/03/2013   Pneumococcal Polysaccharide-23 08/14/2015   Tdap 03/03/2008    MEDICATIONS/ALLERGIES   Current Meds  Medication Sig   albuterol (VENTOLIN HFA) 108 (90 Base) MCG/ACT inhaler Inhale 2 puffs by mouth into the lungs every 6 hours as  needed for wheezing or shortness of breath   allopurinol (ZYLOPRIM) 300 MG tablet Take 1 tablet by mouth daily   apixaban (ELIQUIS) 5 MG TABS tablet Take 1 tablet (5 mg total) by mouth 2 (two) times daily.   atorvastatin (LIPITOR) 10 MG tablet Take 1 tablet by mouth daily   benzonatate (TESSALON) 200 MG capsule Take 1 capsule (200 mg total) by mouth 3 (three) times daily as needed for cough.   calcium carbonate (OS-CAL) 600 MG tablet Take 600 mg by mouth daily.   cetirizine (ZYRTEC) 10 MG tablet Take 10 mg by mouth daily.   Cinnamon 500 MG TABS Take 500 mg by mouth daily.   clopidogrel (PLAVIX) 75 MG tablet Take 1 tablet (75 mg total) by mouth daily with breakfast.   colchicine 0.6 MG tablet Take 1 tablet (0.6 mg total) by mouth 2 (two) times daily. As needed for gout flare.   diflorasone (PSORCON) 0.05 % cream Apply 1 application topically 2 (two) times daily as needed.   fluticasone-salmeterol (ADVAIR DISKUS) 250-50 MCG/ACT AEPB TAKE 1 PUFF BY MOUTH TWICE A DAY   furosemide (LASIX) 20 MG tablet Take 20 mg by mouth as needed.   hydrochlorothiazide (HYDRODIURIL) 25 MG tablet Take 1 tablet by mouth daily   losartan (COZAAR) 50 MG tablet Take 1 tablet by mouth at bedtime   Lutein 20 MG TABS Take 20 mg by mouth daily.   Magnesium 250 MG TABS Take 250 mg by mouth daily.   metoprolol tartrate  (LOPRESSOR) 25 MG tablet Take 1 tablet by mouth twice a day   mirtazapine (REMERON) 45 MG tablet Take 1 tablet by mouth at bedtime   mirtazapine (REMERON) 45 MG tablet Take 1 tablet (45 mg total) by mouth at bedtime.   montelukast (SINGULAIR) 10 MG tablet Take 1 tablet by mouth at bedtime   Multiple Vitamins-Minerals (CENTRUM MINIS WOMEN 50+) TABS Take 1 tablet by mouth daily.   Omega-3 Fatty Acids (FISH OIL) 1000 MG CAPS Take 1,000 mg by mouth daily.   OVER THE COUNTER MEDICATION Place 2 sprays into both nostrils at bedtime. Nasocort nasal spray   pantoprazole (PROTONIX) 40 MG tablet Take 1 tablet (40 mg total) by mouth daily.   Probiotic Product (PROBIOTIC PO) Take 1 tablet by mouth daily.   traZODone (DESYREL) 50 MG tablet Take 0.5-1 tablets (25-50 mg total) by mouth at bedtime as needed for sleep.   triamcinolone lotion (KENALOG) 0.1 % Apply 1 application topically 2 (two) times daily as needed.    Allergies  Allergen Reactions   Nifedipine     REACTION: swollen feet    SOCIAL HISTORY/FAMILY HISTORY   Reviewed in Epic:  Pertinent findings:  Social History   Tobacco Use   Smoking status: Former    Types: Cigarettes    Quit date: 01/02/1971    Years since quitting: 50.5   Smokeless tobacco: Never  Vaping Use   Vaping Use: Never used  Substance Use Topics   Alcohol use: Not Currently    Comment: occasional- wine gives pt headache only drinks once every 6 months   Drug use: No   Social History   Social History Narrative   Not on file    OBJCTIVE -PE, EKG, labs   Wt Readings from Last 3 Encounters:  06/28/21 200 lb (90.7 kg)  05/11/21 205 lb 6 oz (93.2 kg)  04/30/21 204 lb 15.7 oz (93 kg)    Physical Exam: BP 120/64 (BP Location: Left Arm, Patient Position: Sitting,  Cuff Size: Large)    Pulse 61    Ht 5\' 4"  (1.626 m)    Wt 200 lb (90.7 kg)    SpO2 98%    BMI 34.33 kg/m  Physical Exam Vitals reviewed.  Constitutional:      General: She is not in acute  distress.    Appearance: Normal appearance. She is obese. She is not ill-appearing or toxic-appearing.  HENT:     Head: Normocephalic and atraumatic.  Neck:     Vascular: No carotid bruit or JVD.  Cardiovascular:     Rate and Rhythm: Normal rate and regular rhythm. No extrasystoles are present.    Chest Wall: PMI is not displaced.     Pulses: Normal pulses.     Heart sounds: S1 normal and S2 normal. Heart sounds are distant. No murmur heard.   No friction rub. No gallop.  Pulmonary:     Effort: Pulmonary effort is normal. No respiratory distress.     Breath sounds: Normal breath sounds. No wheezing, rhonchi or rales.  Chest:     Chest wall: No tenderness.  Musculoskeletal:        General: Swelling (1+ bilateral LE edema.  Stable.) present. Normal range of motion.     Cervical back: Normal range of motion and neck supple.  Skin:    General: Skin is warm and dry.  Neurological:     General: No focal deficit present.     Mental Status: She is alert and oriented to person, place, and time.  Psychiatric:        Mood and Affect: Mood normal.        Behavior: Behavior normal.        Thought Content: Thought content normal.        Judgment: Judgment normal.     Adult ECG Report  Rate: 61;  Rhythm: normal sinus rhythm and normal axis, intervals durations.  Cannot rule out anterior Mikan age-indeterminate (poor R wave progression) ;   Narrative Interpretation: Stable  Recent Labs: Reviewed Lab Results  Component Value Date   CHOL 133 05/01/2021   HDL 35 (L) 05/01/2021   LDLCALC 69 05/01/2021   LDLDIRECT 92.0 02/18/2019   TRIG 144 05/01/2021   CHOLHDL 3.8 05/01/2021   Lab Results  Component Value Date   CREATININE 0.71 05/01/2021   BUN 12 05/01/2021   NA 133 (L) 05/01/2021   K 3.9 05/01/2021   CL 103 05/01/2021   CO2 25 05/01/2021   CBC Latest Ref Rng & Units 05/11/2021 05/01/2021 04/09/2021  WBC 3.4 - 10.8 x10E3/uL 9.9 8.0 8.2  Hemoglobin 11.1 - 15.9 g/dL 10.9(L)  10.4(L) 11.9(L)  Hematocrit 34.0 - 46.6 % 31.5(L) 30.0(L) 34.3(L)  Platelets 150 - 450 x10E3/uL 279 220 264    Lab Results  Component Value Date   HGBA1C 5.8 04/16/2021   Lab Results  Component Value Date   TSH 1.900 04/09/2021    ==================================================  COVID-19 Education: The signs and symptoms of COVID-19 were discussed with the patient and how to seek care for testing (follow up with PCP or arrange E-visit).    I spent a total of 18 minutes with the patient spent in direct patient consultation.  Additional time spent with chart review  / charting (studies, outside notes, etc): 16 min Total Time: 34 min  Current medicines are reviewed at length with the patient today.  (+/- concerns) none  This visit occurred during the SARS-CoV-2 public health emergency.  Safety protocols were in  place, including screening questions prior to the visit, additional usage of staff PPE, and extensive cleaning of exam room while observing appropriate contact time as indicated for disinfecting solutions.  Notice: This dictation was prepared with Dragon dictation along with smart phrase technology. Any transcriptional errors that result from this process are unintentional and may not be corrected upon review.  Studies Ordered:  Orders Placed This Encounter  Procedures   EKG 12-Lead    Patient Instructions / Medication Changes & Studies & Tests Ordered   Patient Instructions  Medication Instructions:   Please speak with your dentist's office to see if they are ok with doing your procedure while you are on Plavix. If so:  - Hold eliquis Sunday and Monday, restart Tuesday  - Hold Plavix Sunday, take Monday afternoon after procedure   If they are not comfortable doing procedure on eliquis, please reschedule procedure and call our office for instructions   *If you need a refill on your cardiac medications before your next appointment, please call your  pharmacy*   Lab Work: None ordered  If you have labs (blood work) drawn today and your tests are completely normal, you will receive your results only by: [place] (if you have MyChart) OR A paper copy in the mail If you have any lab test that is abnormal or we need to change your treatment, we will call you to review the results.   Testing/Procedures: None ordered   Follow-Up: At [facility], you and your health needs are our priority.  As part of our continuing mission to provide you with exceptional heart care, we have created designated Provider Care Teams.  These Care Teams include your primary Cardiologist (physician) and Advanced Practice Providers (APPs -  Physician Assistants and Nurse Practitioners) who all work together to provide you with the care you need, when you need it.  We recommend signing up for the patient portal called "MyChart".  Sign up information is provided on this After Visit Summary.  MyChart is used to connect with patients for Virtual Visits (Telemedicine).  Patients are able to view lab/test results, encounter notes, upcoming appointments, etc.  Non-urgent messages can be sent to your provider as well.   To learn more about what you can do with MyChart, go to [email or web address].    Your next appointment:   5-6 month(s)  The format for your next appointment:   In Person  Provider:   You may see Glenetta Hew, MD or one of the following Advanced Practice Providers on your designated Care Team:   Murray Hodgkins, NP Christell Faith, PA-C Cadence Kathlen Mody, PA-C1}    Other Instructions N/A      Glenetta Hew, M.D., M.S. Interventional Cardiologist   Pager # [phone] Phone # [phone] [address] [place], [place] [address]   Thank you for choosing [facility] [place]!!

## 2021-06-28 NOTE — Patient Instructions (Signed)
Medication Instructions:   Please speak with your dentist's office to see if they are ok with doing your procedure while you are on Plavix. If so:  - Hold eliquis Sunday and Monday, restart Tuesday  - Hold Plavix Sunday, take Monday afternoon after procedure   If they are not comfortable doing procedure on eliquis, please reschedule procedure and call our office for instructions   *If you need a refill on your cardiac medications before your next appointment, please call your pharmacy*   Lab Work: None ordered  If you have labs (blood work) drawn today and your tests are completely normal, you will receive your results only by: [place] (if you have MyChart) OR A paper copy in the mail If you have any lab test that is abnormal or we need to change your treatment, we will call you to review the results.   Testing/Procedures: None ordered   Follow-Up: At [facility], you and your health needs are our priority.  As part of our continuing mission to provide you with exceptional heart care, we have created designated Provider Care Teams.  These Care Teams include your primary Cardiologist (physician) and Advanced Practice Providers (APPs -  Physician Assistants and Nurse Practitioners) who all work together to provide you with the care you need, when you need it.  We recommend signing up for the patient portal called "MyChart".  Sign up information is provided on this After Visit Summary.  MyChart is used to connect with patients for Virtual Visits (Telemedicine).  Patients are able to view lab/test results, encounter notes, upcoming appointments, etc.  Non-urgent messages can be sent to your provider as well.   To learn more about what you can do with MyChart, go to [email or web address].    Your next appointment:   5-6 month(s)  The format for your next appointment:   In Person  Provider:   You may see Glenetta Hew, MD or one of the following Advanced Practice Providers  on your designated Care Team:   Murray Hodgkins, NP Christell Faith, PA-C Cadence Kathlen Mody, PA-C1}    Other Instructions N/A

## 2021-07-02 ENCOUNTER — Encounter: Payer: Self-pay | Admitting: Cardiology

## 2021-07-02 DIAGNOSIS — D6869 Other thrombophilia: Secondary | ICD-10-CM | POA: Insufficient documentation

## 2021-07-02 DIAGNOSIS — I480 Paroxysmal atrial fibrillation: Secondary | ICD-10-CM | POA: Insufficient documentation

## 2021-07-02 NOTE — Assessment & Plan Note (Signed)
Most recent lipids showed LDL 69.  Just under the target of 170.  On low-dose atorvastatin.  Monitor closely.  Low threshold to increase dose.   Thankfully, A1c was 5.8.  Remains diet controlled.  Not currently on medication.  Low threshold to consider GLP-1 agonist for glycemic control and weight loss.

## 2021-07-02 NOTE — Assessment & Plan Note (Signed)
Thankfully, not having any chest pain.  As it was that she only had chest discomfort and dyspnea in the setting of A-fib RVR.  Now status post PCI to the RCA with moderate disease in the LAD.  Plan: Continue beta-blocker and ARB along with statin.  Uninterrupted Plavix to complete 6 months post PCI and then go to Eliquis alone.

## 2021-07-02 NOTE — Assessment & Plan Note (Signed)
Thankfully, no breakthrough spells.  Rate controlled on low-dose Lopressor.  On Eliquis 5 mg twice daily with no significant bleeding just some mild nosebleeds.  Hemoglobin level stable.  We will also need to consider sleep study.  Plan: Continue Lopressor and Eliquis.  Will likely DC Plavix after 6 months post-PCI.

## 2021-07-02 NOTE — Assessment & Plan Note (Signed)
The patient understands the need to lose weight with diet and exercise. We have discussed specific strategies for this.  Low threshold to consider GLP-1 agonist due to borderline diabetes (diet-controlled), CAD and obesity.

## 2021-07-02 NOTE — Assessment & Plan Note (Signed)
Stable blood pressure current meds.  Taking low-dose beta-blocker, moderate dose losartan and HCTZ. Marland Kitchen No change.

## 2021-07-02 NOTE — Assessment & Plan Note (Signed)
Remains on Eliquis.  Doing well.  Had some mild nosebleeds but stabilized now.  Using humidifier.  CHA2DS2-VASc score is at a minimum of 5 (HTN, DM-2, age, female, aortic plaque/CAD)-> currently on Eliquis along with clopidogrel for stent.  Plan to continue uninterrupted Plavix/clopidogrel for 6 months which would be end of May 2023.  At that time would discontinue clopidogrel and continue Eliquis alone.  For urgent/emergent procedures, okay to hold both Eliquis and clopidogrel as of August 11, 2021.  (Clopidogrel for 5 to 7 days preop, Eliquis 2 to 3 days preop without bridge)  Would prefer to maintain uninterrupted clopidogrel until November 10, 2021

## 2021-07-03 ENCOUNTER — Encounter: Payer: Self-pay | Admitting: Internal Medicine

## 2021-07-04 ENCOUNTER — Ambulatory Visit: Payer: PPO | Admitting: Adult Health

## 2021-07-05 ENCOUNTER — Other Ambulatory Visit: Payer: Self-pay | Admitting: Internal Medicine

## 2021-07-05 DIAGNOSIS — M109 Gout, unspecified: Secondary | ICD-10-CM

## 2021-07-11 NOTE — Progress Notes (Signed)
MRN : [id number]  Kari [name] is a 76 y.o. (1945/11/30) female who presents with chief complaint of check leg swelling.  History of Present Illness:   The patient returns to the office for followup evaluation regarding leg swelling.  The swelling has improved tremendously and the pain associated with swelling is essentially gone. There have not been any interval development of a ulcerations or wounds.   Since the previous visit the patient has been wearing graduated compression stockings intermittently and has noted significant improvement in the lymphedema.    The patient also states elevation in her recliner during the day and exercise is being done too.  At the time of her last visit we had chosen not to pursue a lymph pump given her overall improvement.  The patient wishes to continue with this plan and would consider lymph pump in the future if her lymphedema became difficult to control.   Previous duplex ultrasound of the venous system bilateral lower extremities shows a widely patent deep system bilaterally, no significant superficial reflux noted.    No outpatient medications have been marked as taking for the 07/12/21 encounter (Appointment) with Delana Meyer, Dolores Lory, MD.    Past Medical History:  Diagnosis Date   Allergy    to cat, dogs, pollen, dust, cigarette smoke   Arthritis    feet   Cataract    removed both eyes   COPD (chronic obstructive pulmonary disease) with chronic bronchitis ([place])    And moderate persistent asthma; complicated by Restrictive Lung Disease from obesity   Coronary artery disease involving native coronary artery of native heart with other form of angina pectoris ([place])    a. 04/2021 Cor CTA: Cor Ca2+ 389. Domninant RCA-RPDA<PLA. RCA >70p (abnl CTFFR), LAD 50-69p, LCX Min dz; b. 04/2021 Cath/PCI: LM nl, LAD mild diff dzs, D1/2/3 nl, LCX min irregs, OM1/2/3 nl, RCA 99p (2.75x30 Onyx Frontier DES).   Depression    Diastolic dysfunction    a. 04/2021  Echo: EF 60-65%, GrI DD. Nl RV fxn. Mildly dil LA.   GERD (gastroesophageal reflux disease)    Hx of adenomatous colonic polyps 05/30/2010   Hyperlipidemia    Hypertension    Lymphedema of both lower extremities    No superficial venous reflux noted.  However right-sided saphenofemoral junction reflux noted and left common femoral vein reflux noted.  No DVT or superficial thrombus.   Menopause    Migraines    Miscarriage    X 1   Moderate persistent asthma    PONV (postoperative nausea and vomiting)     Past Surgical History:  Procedure Laterality Date   CATARACT EXTRACTION, BILATERAL     CESAREAN SECTION     X 2   COLONOSCOPY     CORONARY STENT INTERVENTION N/A 04/30/2021   Procedure: CORONARY STENT INTERVENTION;  Surgeon: Wellington Hampshire, MD;  Location: [facility];  Service: Cardiovascular;  Proximal RCA 99% => DES PCI with Onyx Frontier 2.75 mm x 30 mm   DENTAL SURGERY  09/2016   LEFT HEART CATH AND CORONARY ANGIOGRAPHY Left 04/30/2021   Procedure: LEFT HEART CATH AND CORONARY ANGIOGRAPHY;  Surgeon: Wellington Hampshire, MD;  Location: [facility];  Service: Cardiovascular;; Proximal RCA 99% => DES PCI;  [place]; mild diffuse LAD and minimal diffuse LCx disease.  Moderately elevated EDP (22 mmHg).  EF 55-65 %.   POLYPECTOMY     TRANSTHORACIC ECHOCARDIOGRAM  11/04/2019   a) EF 55 to 60%.  Normal LV size and function.  GR 1 DD.  Unable to assess.  Mild LA dilation.  Normal aortic and mitral valve.  Normal RVP.  (Normal study);; b) 04/2021: Normal LV size and function.  EF 60 to 65%.  No RWMA.  GR 1 DD.Normal RV size and function.  Normal aortic and mitral valves.   TUBAL LIGATION  1980    Social History Social History   Tobacco Use   Smoking status: Former    Types: Cigarettes    Quit date: 01/02/1971    Years since quitting: 50.5   Smokeless tobacco: Never  Vaping Use   Vaping Use: Never used  Substance Use Topics   Alcohol use: Not Currently    Comment:  occasional- wine gives pt headache only drinks once every 6 months   Drug use: No    Family History Family History  Problem Relation Age of Onset   Myasthenia gravis Mother    Cervical cancer Mother    Heart disease Father    Coronary artery disease Father    Valvular heart disease Father    Heart attack Father 54   Cancer Sister 15       colon   Colon cancer Sister 76   Heart Problems Sister    Heart Problems Brother    Heart attack Paternal Grandfather        64's   Breast cancer Neg Hx    Esophageal cancer Neg Hx    Colon polyps Neg Hx    Rectal cancer Neg Hx    Stomach cancer Neg Hx     Allergies  Allergen Reactions   Nifedipine     REACTION: swollen feet     REVIEW OF SYSTEMS (Negative unless checked)  Constitutional: [] Weight loss  [] Fever  [] Chills Cardiac: [] Chest pain   [] Chest pressure   [] Palpitations   [] Shortness of breath when laying flat   [] Shortness of breath with exertion. Vascular:  [] Pain in legs with walking   [] Pain in legs at rest  [] History of DVT   [] Phlebitis   [x] Swelling in legs   [] Varicose veins   [] Non-healing ulcers Pulmonary:   [] Uses home oxygen   [] Productive cough   [] Hemoptysis   [] Wheeze  [] COPD   [] Asthma Neurologic:  [] Dizziness   [] Seizures   [] History of stroke   [] History of TIA  [] Aphasia   [] Vissual changes   [] Weakness or numbness in arm   [] Weakness or numbness in leg Musculoskeletal:   [] Joint swelling   [] Joint pain   [] Low back pain Hematologic:  [] Easy bruising  [] Easy bleeding   [] Hypercoagulable state   [] Anemic Gastrointestinal:  [] Diarrhea   [] Vomiting  [] Gastroesophageal reflux/heartburn   [] Difficulty swallowing. Genitourinary:  [] Chronic kidney disease   [] Difficult urination  [] Frequent urination   [] Blood in urine Skin:  [] Rashes   [] Ulcers  Psychological:  [] History of anxiety   []  History of major depression.  Physical Examination  There were no vitals filed for this visit. There is no height or weight on  file to calculate BMI. Gen: WD/WN, NAD Head: [place]/AT, No temporalis wasting.  Ear/Nose/Throat: Hearing grossly intact, nares w/o erythema or drainage, pinna without lesions Eyes: PER, EOMI, sclera nonicteric.  Neck: Supple, no gross masses.  No JVD.  Pulmonary:  Good air movement, no audible wheezing, no use of accessory muscles.  Cardiac: RRR, precordium not hyperdynamic. Vascular:  scattered varicosities present bilaterally.  Mild venous stasis changes to the legs bilaterally.  Trace soft pitting edema  Vessel Right Left  Radial Palpable Palpable  Gastrointestinal: soft, non-distended. No guarding/no peritoneal signs.  Musculoskeletal: M/S 5/5 throughout.  No deformity.  Neurologic: CN 2-12 intact. Pain and light touch intact in extremities.  Symmetrical.  Speech is fluent. Motor exam as listed above. Psychiatric: Judgment intact, Mood & affect appropriate for pt's clinical situation. Dermatologic: Venous rashes no ulcers noted.  No changes consistent with cellulitis. Lymph : No lichenification or skin changes of chronic lymphedema.  CBC Lab Results  Component Value Date   WBC 9.9 05/11/2021   HGB 10.9 (L) 05/11/2021   HCT 31.5 (L) 05/11/2021   MCV 91 05/11/2021   PLT 279 05/11/2021    BMET    Component Value Date/Time   NA 133 (L) 05/01/2021 0505   NA 134 04/11/2021 0913   K 3.9 05/01/2021 0505   CL 103 05/01/2021 0505   CO2 25 05/01/2021 0505   GLUCOSE 103 (H) 05/01/2021 0505   BUN 12 05/01/2021 0505   BUN 11 04/11/2021 0913   CREATININE 0.71 05/01/2021 0505   CREATININE 0.95 (H) 09/08/2020 1455   CALCIUM 8.8 (L) 05/01/2021 0505   GFRNONAA >60 05/01/2021 0505   GFRAA 59 (L) 09/13/2019 1420   CrCl cannot be calculated (Patient's most recent lab result is older than the maximum 21 days allowed.).  COAG No results found for: INR, PROTIME  Radiology No results found.   Assessment/Plan 1. Lymphedema No surgery or intervention at this point in time.  I have  reviewed my discussion with the patient regarding venous insufficiency and why it causes symptoms. I have discussed with the patient the chronic skin changes that accompany venous insufficiency and the long term sequela such as ulceration. Patient will contnue wearing graduated compression stockings on a daily basis, as this has provided excellent control of his edema. The patient will put the stockings on first thing in the morning and removing them in the evening. The patient is reminded not to sleep in the stockings.  In addition, behavioral modification including elevation during the day will be initiated. Exercise is strongly encouraged.  Duplex ultrasound of the bilateral lower extremity shows normal deep system no evidence of superficial reflux  Given the patient's good control and lack of any problems regarding the venous insufficiency and lymphedema a lymph pump in not need at this time.  The patient will follow up with me PRN should anything change.  The patient voices agreement with this plan.   2. Hyperlipidemia associated with type 2 diabetes mellitus ([place]) Continue statin as ordered and reviewed, no changes at this time   3. Type 2 diabetes mellitus with diabetic neuropathy, without long-term current use of insulin ([facility]) Continue hypoglycemic medications as already ordered, these medications have been reviewed and there are no changes at this time.  Hgb A1C to be monitored as already arranged by primary service   4. Moderate persistent asthma without complication Continue pulmonary medications and aerosols as already ordered, these medications have been reviewed and there are no changes at this time.    5. CAD S/P percutaneous coronary angioplasty Continue cardiac and antihypertensive medications as already ordered and reviewed, no changes at this time.  Continue statin as ordered and reviewed, no changes at this time  Nitrates PRN for chest pain     Hortencia Pilar,  MD  07/11/2021 2:15 PM

## 2021-07-12 ENCOUNTER — Other Ambulatory Visit: Payer: Self-pay

## 2021-07-12 ENCOUNTER — Encounter (INDEPENDENT_AMBULATORY_CARE_PROVIDER_SITE_OTHER): Payer: Self-pay | Admitting: Vascular Surgery

## 2021-07-12 ENCOUNTER — Ambulatory Visit (INDEPENDENT_AMBULATORY_CARE_PROVIDER_SITE_OTHER): Payer: PPO | Admitting: Vascular Surgery

## 2021-07-12 VITALS — BP 154/82 | HR 64 | Resp 16 | Wt 197.8 lb

## 2021-07-12 DIAGNOSIS — J4540 Moderate persistent asthma, uncomplicated: Secondary | ICD-10-CM

## 2021-07-12 DIAGNOSIS — E1140 Type 2 diabetes mellitus with diabetic neuropathy, unspecified: Secondary | ICD-10-CM

## 2021-07-12 DIAGNOSIS — I890 Lymphedema, not elsewhere classified: Secondary | ICD-10-CM | POA: Diagnosis not present

## 2021-07-12 DIAGNOSIS — I2510 Atherosclerotic heart disease of native coronary artery without angina pectoris: Secondary | ICD-10-CM

## 2021-07-12 DIAGNOSIS — Z9861 Coronary angioplasty status: Secondary | ICD-10-CM | POA: Diagnosis not present

## 2021-07-12 DIAGNOSIS — E1169 Type 2 diabetes mellitus with other specified complication: Secondary | ICD-10-CM | POA: Diagnosis not present

## 2021-07-12 DIAGNOSIS — E785 Hyperlipidemia, unspecified: Secondary | ICD-10-CM

## 2021-07-16 ENCOUNTER — Ambulatory Visit (INDEPENDENT_AMBULATORY_CARE_PROVIDER_SITE_OTHER): Payer: PPO

## 2021-07-16 ENCOUNTER — Other Ambulatory Visit: Payer: Self-pay

## 2021-07-16 ENCOUNTER — Encounter: Payer: Self-pay | Admitting: Adult Health

## 2021-07-16 ENCOUNTER — Ambulatory Visit (INDEPENDENT_AMBULATORY_CARE_PROVIDER_SITE_OTHER): Payer: PPO | Admitting: Adult Health

## 2021-07-16 VITALS — BP 122/70 | HR 74 | Temp 98.2°F | Ht 64.0 in | Wt 197.0 lb

## 2021-07-16 DIAGNOSIS — M25511 Pain in right shoulder: Secondary | ICD-10-CM

## 2021-07-16 DIAGNOSIS — M79642 Pain in left hand: Secondary | ICD-10-CM | POA: Diagnosis not present

## 2021-07-16 DIAGNOSIS — G8929 Other chronic pain: Secondary | ICD-10-CM

## 2021-07-16 DIAGNOSIS — M19042 Primary osteoarthritis, left hand: Secondary | ICD-10-CM | POA: Diagnosis not present

## 2021-07-16 NOTE — Progress Notes (Signed)
Arthritis in left hand, and will need orthopedics, please see [facility] as referred today. Follow up with PCP as needed.  ?( SEE RIGHT SHOULDER RESULT AS WELL WHEN YOU CALL PATIENT- THANKS) ? ?IMPRESSION: ?Arthritis as above in a distribution suggesting osteoarthritis, ?mild-to-moderate at the thumb carpometacarpal joint and mild at the ?triscaphe joint and thumb interphalangeal joint. ?Electronically Signed ?  By: Yvonne Kendall M.D. ?  On: 07/16/2021 16:23

## 2021-07-16 NOTE — Progress Notes (Signed)
Acute Office Visit  Subjective:    Patient ID: Carolan Avedisian, female    DOB: May 27, 1945, 76 y.o.   MRN: [id number]  Chief Complaint  Patient presents with   Follow-up    F/u - pt c/o swollen L wrist - tingling, numbness. Bump/Knot growing on left side of wrist. Pt c/o R rotator cuff pain.     HPI Patient is in today for  pain in left wrist for at least since Beginning of December. She bought a brace and wore. Denies any injury.  She has a small knot on he left anterior hand. She reports in January she had pain even with the brace on.  Wrist pain rated as 4/10.  She also has pain in her right shoulder. Denies any injury.  This started on and off for the last 2 years. She reports when it flares she is careful of what she lifts. She reports no pain in shoulder today. She does have limited range of motion worse at times. No pain in right shoulder today she reports.  She says she takes Advil occasionally with relief.   Seen Dr. Ellyn Hack on 06/28/2021 for follow up on atrial fibrillation. S/P post PCI to the RCA with moderate disease . Feeling well from this she reports and denies any cardiac symptoms.   Patient  denies any fever, body aches,chills, rash, chest pain, shortness of breath, nausea, vomiting, or diarrhea.  Denies dizziness, lightheadedness, pre syncopal or syncopal episodes.    Past Medical History:  Diagnosis Date   Allergy    to cat, dogs, pollen, dust, cigarette smoke   Arthritis    feet   Cataract    removed both eyes   COPD (chronic obstructive pulmonary disease) with chronic bronchitis (HCC)    And moderate persistent asthma; complicated by Restrictive Lung Disease from obesity   Coronary artery disease involving native coronary artery of native heart with other form of angina pectoris ([place])    a. 04/2021 Cor CTA: Cor Ca2+ 389. Domninant RCA-RPDA<PLA. RCA >70p (abnl CTFFR), LAD 50-69p, LCX Min dz; b. 04/2021 Cath/PCI: LM nl, LAD mild diff dzs, D1/2/3 nl, LCX min  irregs, OM1/2/3 nl, RCA 99p (2.75x30 Onyx Frontier DES).   Depression    Diastolic dysfunction    a. 04/2021 Echo: EF 60-65%, GrI DD. Nl RV fxn. Mildly dil LA.   GERD (gastroesophageal reflux disease)    Hx of adenomatous colonic polyps 05/30/2010   Hyperlipidemia    Hypertension    Lymphedema of both lower extremities    No superficial venous reflux noted.  However right-sided saphenofemoral junction reflux noted and left common femoral vein reflux noted.  No DVT or superficial thrombus.   Menopause    Migraines    Miscarriage    X 1   Moderate persistent asthma    PONV (postoperative nausea and vomiting)     Past Surgical History:  Procedure Laterality Date   CATARACT EXTRACTION, BILATERAL     CESAREAN SECTION     X 2   COLONOSCOPY     CORONARY STENT INTERVENTION N/A 04/30/2021   Procedure: CORONARY STENT INTERVENTION;  Surgeon: Wellington Hampshire, MD;  Location: [facility];  Service: Cardiovascular;  Proximal RCA 99% => DES PCI with Onyx Frontier 2.75 mm x 30 mm   DENTAL SURGERY  09/2016   LEFT HEART CATH AND CORONARY ANGIOGRAPHY Left 04/30/2021   Procedure: LEFT HEART CATH AND CORONARY ANGIOGRAPHY;  Surgeon: Wellington Hampshire, MD;  Location: [facility];  Service: Cardiovascular;; Proximal RCA 99% => DES PCI;  [place]; mild diffuse LAD and minimal diffuse LCx disease.  Moderately elevated EDP (22 mmHg).  EF 55-65 %.   POLYPECTOMY     TRANSTHORACIC ECHOCARDIOGRAM  11/04/2019   a) EF 55 to 60%.  Normal LV size and function.  GR 1 DD.  Unable to assess.  Mild LA dilation.  Normal aortic and mitral valve.  Normal RVP.  (Normal study);; b) 04/2021: Normal LV size and function.  EF 60 to 65%.  No RWMA.  GR 1 DD.Normal RV size and function.  Normal aortic and mitral valves.   TUBAL LIGATION  1980    Family History  Problem Relation Age of Onset   Myasthenia gravis Mother    Cervical cancer Mother    Heart disease Father    Coronary artery disease Father    Valvular  heart disease Father    Heart attack Father 31   Cancer Sister 61       colon   Colon cancer Sister 61   Heart Problems Sister    Heart Problems Brother    Heart attack Paternal Grandfather        41's   Breast cancer Neg Hx    Esophageal cancer Neg Hx    Colon polyps Neg Hx    Rectal cancer Neg Hx    Stomach cancer Neg Hx     Social History   Socioeconomic History   Marital status: Married    Spouse name: Not on file   Number of children: Not on file   Years of education: Not on file   Highest education level: Not on file  Occupational History   Not on file  Tobacco Use   Smoking status: Former    Types: Cigarettes    Quit date: 01/02/1971    Years since quitting: 50.5   Smokeless tobacco: Never  Vaping Use   Vaping Use: Never used  Substance and Sexual Activity   Alcohol use: Not Currently    Comment: occasional- wine gives pt headache only drinks once every 6 months   Drug use: No   Sexual activity: Not Currently  Other Topics Concern   Not on file  Social History Narrative   Not on file   Social Determinants of Health   Financial Resource Strain: Low Risk    Difficulty of Paying Living Expenses: Not hard at all  Food Insecurity: No Food Insecurity   Worried About Charity fundraiser in the Last Year: Never true   Ran Out of Food in the Last Year: Never true  Transportation Needs: No Transportation Needs   Lack of Transportation (Medical): No   Lack of Transportation (Non-Medical): No  Physical Activity: Not on file  Stress: No Stress Concern Present   Feeling of Stress : Not at all  Social Connections: Unknown   Frequency of Communication with Friends and Family: Not on file   Frequency of Social Gatherings with Friends and Family: Not on file   Attends Religious Services: Not on file   Active Member of Clubs or Organizations: Not on file   Attends Archivist Meetings: Not on file   Marital Status: Married  Human resources officer Violence: Not At  Risk   Fear of Current or Ex-Partner: No   Emotionally Abused: No   Physically Abused: No   Sexually Abused: No    Outpatient Medications Prior to Visit  Medication Sig Dispense Refill   albuterol (  VENTOLIN HFA) 108 (90 Base) MCG/ACT inhaler Inhale 2 puffs by mouth into the lungs every 6 hours as needed for wheezing or shortness of breath 8.5 g 0   allopurinol (ZYLOPRIM) 300 MG tablet Take 1 tablet by mouth daily 90 tablet 0   apixaban (ELIQUIS) 5 MG TABS tablet Take 1 tablet (5 mg total) by mouth 2 (two) times daily. 180 tablet 0   atorvastatin (LIPITOR) 10 MG tablet Take 1 tablet by mouth daily 90 tablet 0   benzonatate (TESSALON) 200 MG capsule Take 1 capsule (200 mg total) by mouth 3 (three) times daily as needed for cough. 60 capsule 1   calcium carbonate (OS-CAL) 600 MG tablet Take 600 mg by mouth daily.     cetirizine (ZYRTEC) 10 MG tablet Take 10 mg by mouth daily.     Cinnamon 500 MG TABS Take 500 mg by mouth daily.     clopidogrel (PLAVIX) 75 MG tablet Take 1 tablet (75 mg total) by mouth daily with breakfast. 90 tablet 3   colchicine 0.6 MG tablet Take 1 tablet (0.6 mg total) by mouth 2 (two) times daily. As needed for gout flare. 60 tablet 2   diflorasone (PSORCON) 0.05 % cream Apply 1 application topically 2 (two) times daily as needed.     fluticasone-salmeterol (ADVAIR DISKUS) 250-50 MCG/ACT AEPB TAKE 1 PUFF BY MOUTH TWICE A DAY 60 each 3   furosemide (LASIX) 20 MG tablet Take 20 mg by mouth as needed.     hydrochlorothiazide (HYDRODIURIL) 25 MG tablet Take 1 tablet by mouth daily 90 tablet 0   losartan (COZAAR) 50 MG tablet Take 1 tablet by mouth at bedtime 90 tablet 0   Lutein 20 MG TABS Take 20 mg by mouth daily.     Magnesium 250 MG TABS Take 250 mg by mouth daily.     metoprolol tartrate (LOPRESSOR) 25 MG tablet Take 1 tablet by mouth twice a day 180 tablet 0   mirtazapine (REMERON) 45 MG tablet Take 1 tablet by mouth at bedtime 90 tablet 0   mirtazapine (REMERON) 45  MG tablet Take 1 tablet (45 mg total) by mouth at bedtime. 90 tablet 1   montelukast (SINGULAIR) 10 MG tablet Take 1 tablet by mouth at bedtime 90 tablet 0   Multiple Vitamins-Minerals (CENTRUM MINIS WOMEN 50+) TABS Take 1 tablet by mouth daily.     Omega-3 Fatty Acids (FISH OIL) 1000 MG CAPS Take 1,000 mg by mouth daily.     OVER THE COUNTER MEDICATION Place 2 sprays into both nostrils at bedtime. Nasocort nasal spray     pantoprazole (PROTONIX) 40 MG tablet Take 1 tablet (40 mg total) by mouth daily. 90 tablet 3   Probiotic Product (PROBIOTIC PO) Take 1 tablet by mouth daily.     traZODone (DESYREL) 50 MG tablet Take 0.5-1 tablets (25-50 mg total) by mouth at bedtime as needed for sleep. 90 tablet 1   triamcinolone lotion (KENALOG) 0.1 % Apply 1 application topically 2 (two) times daily as needed.     No facility-administered medications prior to visit.    Allergies  Allergen Reactions   Nifedipine     REACTION: swollen feet    Review of Systems  Constitutional: Negative.   HENT: Negative.    Respiratory: Negative.    Cardiovascular: Negative.   Gastrointestinal: Negative.   Endocrine: Negative.   Genitourinary: Negative.   Musculoskeletal:  Positive for joint swelling and myalgias. Negative for arthralgias, back pain, gait problem, neck pain  and neck stiffness.  Skin: Negative.   Neurological: Negative.   Hematological: Negative.   Psychiatric/Behavioral: Negative.        Objective:    Physical Exam Constitutional:      General: She is not in acute distress.    Appearance: She is not ill-appearing, toxic-appearing or diaphoretic.  HENT:     Head: Normocephalic and atraumatic.     Right Ear: External ear normal.     Left Ear: External ear normal.     Nose: Nose normal. No congestion or rhinorrhea.     Mouth/Throat:     Mouth: Mucous membranes are moist.     Pharynx: Oropharynx is clear. No oropharyngeal exudate or posterior oropharyngeal erythema.  Eyes:      Extraocular Movements: Extraocular movements intact.     Pupils: Pupils are equal, round, and reactive to light.  Neck:     Vascular: No carotid bruit.  Cardiovascular:     Rate and Rhythm: Normal rate and regular rhythm.     Pulses: Normal pulses.     Heart sounds: Normal heart sounds. No murmur heard.   No friction rub. No gallop.  Pulmonary:     Effort: Pulmonary effort is normal. No respiratory distress.     Breath sounds: Normal breath sounds. No stridor. No wheezing, rhonchi or rales.  Chest:     Chest wall: No tenderness.  Abdominal:     General: There is no distension.     Palpations: Abdomen is soft.     Tenderness: There is no abdominal tenderness.  Musculoskeletal:     Right shoulder: Tenderness present. No bony tenderness or crepitus. Decreased range of motion. Normal strength. Normal pulse.     Left shoulder: No tenderness, bony tenderness or crepitus. Normal range of motion. Normal strength. Normal pulse.     Right upper arm: Normal.     Left upper arm: Normal.     Right elbow: Normal.     Left elbow: Normal.     Right forearm: Normal.     Left forearm: Normal.     Right wrist: No swelling, deformity, effusion, lacerations, tenderness, bony tenderness, snuff box tenderness or crepitus. Normal range of motion. Normal pulse.     Left wrist: Bony tenderness present. No swelling, deformity, effusion, lacerations, tenderness, snuff box tenderness or crepitus. Normal range of motion. Normal pulse.     Right hand: Normal.     Left hand: Normal.     Cervical back: Normal range of motion and neck supple. No rigidity or tenderness.     Right lower leg: No edema.     Left lower leg: No edema.     Comments: Right shoulder AC joint tender with palpation.   Lymphadenopathy:     Cervical: No cervical adenopathy.  Skin:    Findings: No erythema.  Neurological:     Mental Status: She is alert and oriented to person, place, and time.  Psychiatric:        Mood and Affect: Mood  normal.        Behavior: Behavior normal.        Thought Content: Thought content normal.        Judgment: Judgment normal.    BP 122/70 (BP Location: Left Arm, Patient Position: Sitting, Cuff Size: Small)    Pulse 74    Temp 98.2 F (36.8 C)    Ht '5\' 4"'  (1.626 m)    Wt 197 lb (89.4 kg)    SpO2 96%  BMI 33.81 kg/m  Wt Readings from Last 3 Encounters:  07/16/21 197 lb (89.4 kg)  07/12/21 197 lb 12.8 oz (89.7 kg)  06/28/21 200 lb (90.7 kg)    Health Maintenance Due  Topic Date Due   Zoster Vaccines- Shingrix (1 of 2) Never done   OPHTHALMOLOGY EXAM  06/19/2021    There are no preventive care reminders to display for this patient.   Lab Results  Component Value Date   TSH 1.900 04/09/2021   Lab Results  Component Value Date   WBC 9.9 05/11/2021   HGB 10.9 (L) 05/11/2021   HCT 31.5 (L) 05/11/2021   MCV 91 05/11/2021   PLT 279 05/11/2021   Lab Results  Component Value Date   NA 133 (L) 05/01/2021   K 3.9 05/01/2021   CO2 25 05/01/2021   GLUCOSE 103 (H) 05/01/2021   BUN 12 05/01/2021   CREATININE 0.71 05/01/2021   BILITOT 0.9 05/01/2021   ALKPHOS 74 05/01/2021   AST 37 05/01/2021   ALT 38 05/01/2021   PROT 5.9 (L) 05/01/2021   ALBUMIN 3.2 (L) 05/01/2021   CALCIUM 8.8 (L) 05/01/2021   ANIONGAP 5 05/01/2021   EGFR 68 04/11/2021   GFR 53.35 (L) 02/18/2019   Lab Results  Component Value Date   CHOL 133 05/01/2021   Lab Results  Component Value Date   HDL 35 (L) 05/01/2021   Lab Results  Component Value Date   LDLCALC 69 05/01/2021   Lab Results  Component Value Date   TRIG 144 05/01/2021   Lab Results  Component Value Date   CHOLHDL 3.8 05/01/2021   Lab Results  Component Value Date   HGBA1C 5.8 04/16/2021       Assessment & Plan:   Problem List Items Addressed This Visit       Other   Left hand pain   Relevant Orders   Ambulatory referral to Orthopedic Surgery   DG Hand Complete Left   Chronic right shoulder pain - Primary    Relevant Orders   Ambulatory referral to Orthopedic Surgery   DG Shoulder Right   Left hand questionable arthritic, x ray today.  Right shoulder pain chronic, questionable AC joint involvement, or adhesive capsulitis versus rotator cuff.   She will need x rays today of shoulder - right and left wrist.   Referral to orthopedics done, [facility]. She is aware.  Avoiding steroids given she is on eliquis.    You will be contacted regarding your referral to .  Please let us know if you have not been contacted within two weeks.    Red Flags discussed. The patient was given clear instructions to go to ER or return to medical center if any red flags develop, symptoms do not improve, worsen or new problems develop. They verbalized understanding.   Return if symptoms worsen or fail to improve, for at any time for any worsening symptoms, Go to Emergency room/ urgent care if worse.   No orders of the defined types were placed in this encounter.    Marcille Buffy, FNP

## 2021-07-16 NOTE — Patient Instructions (Signed)
[facility] ? ?Orthopedic clinic in [place], [place] ?COVID-19 info: [email or web address] ?Get online care: [email or web address] ?Address: [address] [place], [place], [place] [address] ?Hours:  ?Open ? Closes 9?PM ?Phone: [phone] ? ? ?Wrist Pain, Adult ?There are many things that can cause wrist pain. Some common causes include: ?An injury to the wrist. ?Using the joint too much. ?A condition that causes too much pressure to be put on a nerve in the wrist (carpal tunnel syndrome). ?Wear and tear of the joints that happens as a person gets older (osteoarthritis). ?A condition that causes swelling and stiffness in the joints (arthritis). ?Sometimes, the cause of wrist pain is not known. ?Often, the pain goes away when you follow your doctor's instructions for easing pain at home. This may include resting your wrist, icing your wrist, or using a splint or an elastic wrap for a short time. It is important to tell your doctor if your wrist pain does not go away. ?Follow these instructions at home: ?If you have a splint or elastic wrap: ?Wear the splint or wrap as told by your doctor. Take it off only as told by your doctor. Ask if you can take it off for bathing. ?Loosen the splint or wrap if your fingers: ?Tingle. ?Become numb. ?Turn cold and blue. ?Check the skin around the splint or wrap every day. Tell your doctor about any concerns. ?Keep the splint or wrap clean. ?If the splint or wrap is not waterproof: ?Do not let it get wet. ?Cover it with a watertight covering when you take a bath or shower. ?Managing pain, stiffness, and swelling ? ?If told, put ice on the painful area. To do this: ?If you have a removable splint or wrap, take it off as told by your doctor. ?Put ice in a plastic bag. ?Place a towel between your skin and the bag or between your splint or wrap and the bag. ?Leave the ice on for 20 minutes, 2-3 times a day. ?Move your fingers often. ?Raise (elevate) the injured area above the  level of your heart while you are sitting or lying down. ?Activity ?Rest your wrist as told by your doctor. ?Return to your normal activities as told by your doctor. Ask your doctor what activities are safe for you. ?Ask your doctor when it is safe to drive if you have a splint or wrap on your wrist. ?Do exercises as told by your doctor. ?General instructions ?Pay attention to any changes in your symptoms. ?Take over-the-counter and prescription medicines only as told by your doctor. ?Keep all follow-up visits as told by your doctor. This is important. ?Contact a doctor if: ?You have a sudden, sharp pain in the wrist, hand, or arm that is different or new. ?The swelling or bruising on your wrist or hand gets worse. ?Your skin: ?Becomes red. ?Gets a rash. ?Has open sores. ?Your pain does not get better. ?Your pain gets worse. ?You have a fever or chills. ?Get help right away if: ?You lose feeling in your fingers or hand. ?Your fingers turn white, very red, or cold and blue. ?You cannot move your fingers. ?Summary ?There are many things that can cause wrist pain. ?It is important to tell your doctor if your wrist pain does not go away. ?You may need to wear a splint or a wrap for a short period of time. ?Return to your normal activities as told by your doctor. Ask your doctor what activities are safe for you. ?This information is not intended  to replace advice given to you by your health care provider. Make sure you discuss any questions you have with your health care provider. ?Document Revised: 03/18/2019 Document Reviewed: 03/18/2019 ?Elsevier Patient Education ? [place]. ?Shoulder Pain ?Many things can cause shoulder pain, including: ?An injury. ?Moving the shoulder in the same way again and again (overuse). ?Joint pain (arthritis). ?Pain can come from: ?Swelling and irritation (inflammation) of any part of the shoulder. ?An injury to the shoulder joint. ?An injury to: ?Tissues that connect muscle to  bone (tendons). ?Tissues that connect bones to each other (ligaments). ?Bones. ?Follow these instructions at home: ?Watch for changes in your symptoms. Let your doctor know about them. Follow these instructions to help with your pain. ?If you have a sling: ?Wear the sling as told by your doctor. Remove it only as told by your doctor. ?Loosen the sling if your fingers: ?Tingle. ?Become numb. ?Turn cold and blue. ?Keep the sling clean. ?If the sling is not waterproof: ?Do not let it get wet. ?Take the sling off when you shower or bathe. ?Managing pain, stiffness, and swelling ? ?If told, put ice on the painful area: ?Put ice in a plastic bag. ?Place a towel between your skin and the bag. ?Leave the ice on for 20 minutes, 2-3 times a day. Stop putting ice on if it does not help with the pain. ?Squeeze a soft ball or a foam pad as much as possible. This prevents swelling in the shoulder. It also helps to strengthen the arm. ?General instructions ?Take over-the-counter and prescription medicines only as told by your doctor. ?Keep all follow-up visits as told by your doctor. This is important. ?Contact a doctor if: ?Your pain gets worse. ?Medicine does not help your pain. ?You have new pain in your arm, hand, or fingers. ?Get help right away if: ?Your arm, hand, or fingers: ?Tingle. ?Are numb. ?Are swollen. ?Are painful. ?Turn white or blue. ?Summary ?Shoulder pain can be caused by many things. These include injury, moving the shoulder in the same away again and again, and joint pain. ?Watch for changes in your symptoms. Let your doctor know about them. ?This condition may be treated with a sling, ice, and pain medicine. ?Contact your doctor if the pain gets worse or you have new pain. Get help right away if your arm, hand, or fingers tingle or get numb, swollen, or painful. ?Keep all follow-up visits as told by your doctor. This is important. ?This information is not intended to replace advice given to you by your health  care provider. Make sure you discuss any questions you have with your health care provider. ?Document Revised: 01/12/2021 Document Reviewed: 01/12/2021 ?Elsevier Patient Education ? [place]. ? ?

## 2021-07-16 NOTE — Progress Notes (Signed)
Right shoulder  x ray does show some joint space narrowing, bone spurs, and degenerative disc on the thoracic spine. Do still advise she see the orthopedic as referred today and she can walk into [facility] as well. She will likely need to have further imaging and work up with orthopedics.  ? ?Mild-to-moderate multilevel degenerative disc changes of the ?thoracic spine. ?No acute fracture or dislocation. The visualized portion of the ?right lung is unremarkable. ?IMPRESSION: ?Mild-to-moderate glenohumeral and mild acromioclavicular ?osteoarthritis.

## 2021-07-17 ENCOUNTER — Telehealth: Payer: Self-pay

## 2021-07-17 NOTE — Telephone Encounter (Signed)
See mychart msg

## 2021-07-19 DIAGNOSIS — H524 Presbyopia: Secondary | ICD-10-CM | POA: Diagnosis not present

## 2021-07-19 DIAGNOSIS — H35342 Macular cyst, hole, or pseudohole, left eye: Secondary | ICD-10-CM | POA: Diagnosis not present

## 2021-07-19 DIAGNOSIS — Z7984 Long term (current) use of oral hypoglycemic drugs: Secondary | ICD-10-CM | POA: Diagnosis not present

## 2021-07-19 DIAGNOSIS — H5203 Hypermetropia, bilateral: Secondary | ICD-10-CM | POA: Diagnosis not present

## 2021-07-19 DIAGNOSIS — E119 Type 2 diabetes mellitus without complications: Secondary | ICD-10-CM | POA: Diagnosis not present

## 2021-07-19 DIAGNOSIS — D3132 Benign neoplasm of left choroid: Secondary | ICD-10-CM | POA: Diagnosis not present

## 2021-07-19 DIAGNOSIS — Z961 Presence of intraocular lens: Secondary | ICD-10-CM | POA: Diagnosis not present

## 2021-07-19 DIAGNOSIS — H52223 Regular astigmatism, bilateral: Secondary | ICD-10-CM | POA: Diagnosis not present

## 2021-07-19 DIAGNOSIS — H02834 Dermatochalasis of left upper eyelid: Secondary | ICD-10-CM | POA: Diagnosis not present

## 2021-07-19 DIAGNOSIS — H35372 Puckering of macula, left eye: Secondary | ICD-10-CM | POA: Diagnosis not present

## 2021-07-19 DIAGNOSIS — H02831 Dermatochalasis of right upper eyelid: Secondary | ICD-10-CM | POA: Diagnosis not present

## 2021-07-19 LAB — HM DIABETES EYE EXAM

## 2021-07-23 DIAGNOSIS — M25532 Pain in left wrist: Secondary | ICD-10-CM | POA: Diagnosis not present

## 2021-07-23 DIAGNOSIS — M25511 Pain in right shoulder: Secondary | ICD-10-CM | POA: Diagnosis not present

## 2021-08-09 DIAGNOSIS — H57813 Brow ptosis, bilateral: Secondary | ICD-10-CM | POA: Diagnosis not present

## 2021-08-09 DIAGNOSIS — H04123 Dry eye syndrome of bilateral lacrimal glands: Secondary | ICD-10-CM | POA: Diagnosis not present

## 2021-08-09 DIAGNOSIS — H02834 Dermatochalasis of left upper eyelid: Secondary | ICD-10-CM | POA: Diagnosis not present

## 2021-08-09 DIAGNOSIS — H02413 Mechanical ptosis of bilateral eyelids: Secondary | ICD-10-CM | POA: Diagnosis not present

## 2021-08-09 DIAGNOSIS — H02831 Dermatochalasis of right upper eyelid: Secondary | ICD-10-CM | POA: Diagnosis not present

## 2021-08-09 DIAGNOSIS — H02423 Myogenic ptosis of bilateral eyelids: Secondary | ICD-10-CM | POA: Diagnosis not present

## 2021-08-09 DIAGNOSIS — H53483 Generalized contraction of visual field, bilateral: Secondary | ICD-10-CM | POA: Diagnosis not present

## 2021-08-09 DIAGNOSIS — H0279 Other degenerative disorders of eyelid and periocular area: Secondary | ICD-10-CM | POA: Diagnosis not present

## 2021-08-10 ENCOUNTER — Telehealth: Payer: Self-pay | Admitting: Internal Medicine

## 2021-08-10 NOTE — Telephone Encounter (Signed)
Pt would like to be referred to [facility]  ?

## 2021-08-13 NOTE — Telephone Encounter (Signed)
LMTCB. Need to find out what the pt needs to see pulmonology for.  ?

## 2021-08-30 ENCOUNTER — Ambulatory Visit (INDEPENDENT_AMBULATORY_CARE_PROVIDER_SITE_OTHER): Payer: PPO

## 2021-08-30 ENCOUNTER — Other Ambulatory Visit: Payer: Self-pay

## 2021-08-30 VITALS — Ht 64.0 in | Wt 197.0 lb

## 2021-08-30 DIAGNOSIS — Z0000 Encounter for general adult medical examination without abnormal findings: Secondary | ICD-10-CM | POA: Diagnosis not present

## 2021-08-30 MED ORDER — FLUTICASONE-SALMETEROL 250-50 MCG/ACT IN AEPB: INHALATION_SPRAY | RESPIRATORY_TRACT | 3 refills | Status: DC

## 2021-08-30 NOTE — Patient Instructions (Addendum)
?  Ms. Peggs , ?Thank you for taking time to come for your Medicare Wellness Visit. I appreciate your ongoing commitment to your health goals. Please review the following plan we discussed and let me know if I can assist you in the future.  ? ?These are the goals we discussed: ? Goals   ? ?  Increase physical activity   ?  Use stationary bike ?Walk as tolerated ?Exercise 5 days, 30 minutes ?  ? ?  ?  ?This is a list of the screening recommended for you and due dates:  ?Health Maintenance  ?Topic Date Due  ? Tetanus Vaccine  09/09/2021*  ? Zoster (Shingles) Vaccine (1 of 2) 11/29/2021*  ? Complete foot exam   09/08/2021  ? Hemoglobin A1C  10/15/2021  ? Flu Shot  12/11/2021  ? Eye exam for diabetics  06/13/2022  ? Mammogram  10/18/2022  ? Colon Cancer Screening  12/12/2025  ? Pneumonia Vaccine  Completed  ? DEXA scan (bone density measurement)  Completed  ? COVID-19 Vaccine  Completed  ? Hepatitis C Screening: USPSTF Recommendation to screen - Ages 31-79 yo.  Completed  ? HPV Vaccine  Aged Out  ?*Topic was postponed. The date shown is not the original due date.  ?  ?

## 2021-08-30 NOTE — Progress Notes (Addendum)
Subjective:   Kari [name] is a 76 y.o. female who presents for Medicare Annual (Subsequent) preventive examination.  Review of Systems    No ROS.  Medicare Wellness Virtual Visit.  Visual/audio telehealth visit, UTA vital signs.   See social history for additional risk factors.   Cardiac Risk Factors include: advanced age (>29men, >60 women);diabetes mellitus     Objective:    Today's Vitals   08/30/21 0951  Weight: 197 lb (89.4 kg)  Height: 5\' 4"  (1.626 m)   Body mass index is 33.81 kg/m.     08/30/2021    9:55 AM 04/30/2021    9:00 PM 04/30/2021   11:45 AM 04/09/2021    8:45 AM 08/29/2020    2:54 PM 09/13/2019   12:44 PM 08/20/2019    9:40 AM  Advanced Directives  Does Patient Have a Medical Advance Directive? Yes No  No Yes No Yes  Type of Advance Directive Living will;Healthcare Power of [employer] Power of [employer] Power of [place];Living will  Does patient want to make changes to medical advance directive? No - Patient declined    No - Patient declined  No - Patient declined  Copy of Healthcare Power of Attorney in Chart? No - copy requested    No - copy requested  No - copy requested  Would patient like information on creating a medical advance directive?  No - Patient declined No - Patient declined No - Patient declined       Current Medications (verified) Outpatient Encounter Medications as of 08/30/2021  Medication Sig   albuterol (VENTOLIN HFA) 108 (90 Base) MCG/ACT inhaler Inhale 2 puffs by mouth into the lungs every 6 hours as needed for wheezing or shortness of breath   allopurinol (ZYLOPRIM) 300 MG tablet Take 1 tablet by mouth daily   apixaban (ELIQUIS) 5 MG TABS tablet Take 1 tablet (5 mg total) by mouth 2 (two) times daily.   atorvastatin (LIPITOR) 10 MG tablet Take 1 tablet by mouth daily   benzonatate (TESSALON) 200 MG capsule Take 1 capsule (200 mg total) by mouth 3 (three) times daily as needed for cough.   calcium carbonate  (OS-CAL) 600 MG tablet Take 600 mg by mouth daily.   cetirizine (ZYRTEC) 10 MG tablet Take 10 mg by mouth daily.   Cinnamon 500 MG TABS Take 500 mg by mouth daily.   clopidogrel (PLAVIX) 75 MG tablet Take 1 tablet (75 mg total) by mouth daily with breakfast.   colchicine 0.6 MG tablet Take 1 tablet (0.6 mg total) by mouth 2 (two) times daily. As needed for gout flare.   diflorasone (PSORCON) 0.05 % cream Apply 1 application topically 2 (two) times daily as needed.   fluticasone-salmeterol (ADVAIR DISKUS) 250-50 MCG/ACT AEPB TAKE 1 PUFF BY MOUTH TWICE A DAY   furosemide (LASIX) 20 MG tablet Take 20 mg by mouth as needed.   hydrochlorothiazide (HYDRODIURIL) 25 MG tablet Take 1 tablet by mouth daily   losartan (COZAAR) 50 MG tablet Take 1 tablet by mouth at bedtime   Lutein 20 MG TABS Take 20 mg by mouth daily.   Magnesium 250 MG TABS Take 250 mg by mouth daily.   metoprolol tartrate (LOPRESSOR) 25 MG tablet Take 1 tablet by mouth twice a day   mirtazapine (REMERON) 45 MG tablet Take 1 tablet by mouth at bedtime   mirtazapine (REMERON) 45 MG tablet Take 1 tablet (45 mg total) by mouth at bedtime.  montelukast (SINGULAIR) 10 MG tablet Take 1 tablet by mouth at bedtime   Multiple Vitamins-Minerals (CENTRUM MINIS WOMEN 50+) TABS Take 1 tablet by mouth daily.   Omega-3 Fatty Acids (FISH OIL) 1000 MG CAPS Take 1,000 mg by mouth daily.   OVER THE COUNTER MEDICATION Place 2 sprays into both nostrils at bedtime. Nasocort nasal spray   pantoprazole (PROTONIX) 40 MG tablet Take 1 tablet (40 mg total) by mouth daily.   Probiotic Product (PROBIOTIC PO) Take 1 tablet by mouth daily.   traZODone (DESYREL) 50 MG tablet Take 0.5-1 tablets (25-50 mg total) by mouth at bedtime as needed for sleep.   triamcinolone lotion (KENALOG) 0.1 % Apply 1 application topically 2 (two) times daily as needed.   [DISCONTINUED] mometasone (NASONEX) 50 MCG/ACT nasal spray Place 2 sprays into the nose daily.   No  facility-administered encounter medications on file as of 08/30/2021.    Allergies (verified) Nifedipine   History: Past Medical History:  Diagnosis Date   Allergy    to cat, dogs, pollen, dust, cigarette smoke   Arthritis    feet   Cataract    removed both eyes   COPD (chronic obstructive pulmonary disease) with chronic bronchitis (HCC)    And moderate persistent asthma; complicated by Restrictive Lung Disease from obesity   Coronary artery disease involving native coronary artery of native heart with other form of angina pectoris (HCC)    a. 04/2021 Cor CTA: Cor Ca2+ 389. Domninant RCA-RPDA<PLA. RCA >70p (abnl CTFFR), LAD 50-69p, LCX Min dz; b. 04/2021 Cath/PCI: LM nl, LAD mild diff dzs, D1/2/3 nl, LCX min irregs, OM1/2/3 nl, RCA 99p (2.75x30 Onyx Frontier DES).   Depression    Diastolic dysfunction    a. 04/2021 Echo: EF 60-65%, GrI DD. Nl RV fxn. Mildly dil LA.   GERD (gastroesophageal reflux disease)    Hx of adenomatous colonic polyps 05/30/2010   Hyperlipidemia    Hypertension    Lymphedema of both lower extremities    No superficial venous reflux noted.  However right-sided saphenofemoral junction reflux noted and left common femoral vein reflux noted.  No DVT or superficial thrombus.   Menopause    Migraines    Miscarriage    X 1   Moderate persistent asthma    PONV (postoperative nausea and vomiting)    Past Surgical History:  Procedure Laterality Date   CATARACT EXTRACTION, BILATERAL     CESAREAN SECTION     X 2   COLONOSCOPY     CORONARY STENT INTERVENTION N/A 04/30/2021   Procedure: CORONARY STENT INTERVENTION;  Surgeon: Iran Ouch, MD;  Location: [facility] INVASIVE CV LAB;  Service: Cardiovascular;  Proximal RCA 99% => DES PCI with Onyx Frontier 2.75 mm x 30 mm   DENTAL SURGERY  09/2016   LEFT HEART CATH AND CORONARY ANGIOGRAPHY Left 04/30/2021   Procedure: LEFT HEART CATH AND CORONARY ANGIOGRAPHY;  Surgeon: Iran Ouch, MD;  Location: [facility] INVASIVE  CV LAB;  Service: Cardiovascular;; Proximal RCA 99% => DES PCI;  [place]; mild diffuse LAD and minimal diffuse LCx disease.  Moderately elevated EDP (22 mmHg).  EF 55-65 %.   POLYPECTOMY     TRANSTHORACIC ECHOCARDIOGRAM  11/04/2019   a) EF 55 to 60%.  Normal LV size and function.  GR 1 DD.  Unable to assess.  Mild LA dilation.  Normal aortic and mitral valve.  Normal RVP.  (Normal study);; b) 04/2021: Normal LV size and function.  EF 60 to 65%.  No  RWMA.  GR 1 DD.Normal RV size and function.  Normal aortic and mitral valves.   TUBAL LIGATION  1980   Family History  Problem Relation Age of Onset   Myasthenia gravis Mother    Cervical cancer Mother    Heart disease Father    Coronary artery disease Father    Valvular heart disease Father    Heart attack Father 33   Cancer Sister 34       colon   Colon cancer Sister 3   Heart Problems Sister    Heart Problems Brother    Heart attack Paternal Grandfather        19's   Breast cancer Neg Hx    Esophageal cancer Neg Hx    Colon polyps Neg Hx    Rectal cancer Neg Hx    Stomach cancer Neg Hx    Social History   Socioeconomic History   Marital status: Married    Spouse name: Not on file   Number of children: Not on file   Years of education: Not on file   Highest education level: Not on file  Occupational History   Not on file  Tobacco Use   Smoking status: Former    Types: Cigarettes    Quit date: 01/02/1971    Years since quitting: 50.6   Smokeless tobacco: Never  Vaping Use   Vaping Use: Never used  Substance and Sexual Activity   Alcohol use: Not Currently    Comment: occasional- wine gives pt headache only drinks once every 6 months   Drug use: No   Sexual activity: Not Currently  Other Topics Concern   Not on file  Social History Narrative   Not on file   Social Determinants of Health   Financial Resource Strain: Low Risk    Difficulty of Paying Living Expenses: Not hard at all  Food Insecurity: No Food Insecurity    Worried About Programme researcher, broadcasting/film/video in the Last Year: Never true   Ran Out of Food in the Last Year: Never true  Transportation Needs: No Transportation Needs   Lack of Transportation (Medical): No   Lack of Transportation (Non-Medical): No  Physical Activity: Not on file  Stress: No Stress Concern Present   Feeling of Stress : Not at all  Social Connections: Unknown   Frequency of Communication with Friends and Family: Not on file   Frequency of Social Gatherings with Friends and Family: Not on file   Attends Religious Services: Not on file   Active Member of Clubs or Organizations: Not on file   Attends Banker Meetings: Not on file   Marital Status: Married    Tobacco Counseling Counseling given: Not Answered   Clinical Intake:  Pre-visit preparation completed: Yes        Diabetes: Yes (Followed by PCP)  How often do you need to have someone help you when you read instructions, pamphlets, or other written materials from your doctor or pharmacy?: 1 - Never   Interpreter Needed?: No    Activities of Daily Living    08/30/2021   10:08 AM 04/30/2021    9:00 PM  In your present state of health, do you have any difficulty performing the following activities:  Hearing? 0   Vision? 0   Difficulty concentrating or making decisions? 0   Walking or climbing stairs? 0   Dressing or bathing? 0   Doing errands, shopping? 0 0  Preparing Food and eating ? N  Using the Toilet? N   In the past six months, have you accidently leaked urine? N   Do you have problems with loss of bowel control? N   Managing your Medications? N   Managing your Finances? N   Housekeeping or managing your Housekeeping? N     Patient Care Team: Sherlene Shams, MD as PCP - General (Internal Medicine) Marykay Lex, MD as PCP - Cardiology (Cardiology)  Indicate any recent Medical Services you may have received from other than [facility] providers in the past year (date may be  approximate).     Assessment:   This is a routine wellness examination for Kari [name].  Virtual Visit via Telephone Note  I connected with  Kari [name] on 08/30/21 at  9:45 AM EDT by telephone and verified that I am speaking with the correct person using two identifiers.  Persons participating in the virtual visit: patient/Nurse Health Advisor   I discussed the limitations of performing an evaluation and management service by telehealth. The patient expressed understanding and agreed to proceed. We continued and completed visit with audio only. Some vital signs may be absent or patient reported.   Hearing/Vision screen Hearing Screening - Comments:: Patient is able to hear conversational tones without difficulty.  No issues reported. Vision Screening - Comments:: Followed by Dr. Larence Penning Wears corrective lenses  Dietary issues and exercise activities discussed: Current Exercise Habits: The patient does not participate in regular exercise at present Healthy diet Good water intake   Goals Addressed             This Visit's Progress    Increase physical activity       Use stationary bike Walk as tolerated Exercise 5 days, 30 minutes       Depression Screen    08/30/2021    9:54 AM 07/16/2021   11:10 AM 04/16/2021    2:38 PM 03/22/2021   10:07 AM 09/08/2020    2:08 PM 08/29/2020    2:55 PM 08/29/2020    2:49 PM  PHQ 2/9 Scores  PHQ - 2 Score 0 0 0 0 0 0 0  PHQ- 9 Score     2      Fall Risk    08/30/2021   10:08 AM 07/16/2021   11:10 AM 04/16/2021    2:38 PM 03/22/2021   10:07 AM 09/08/2020    2:07 PM  Fall Risk   Falls in the past year? 0 0 0 0 0  Number falls in past yr: 0   0   Injury with Fall?    0   Risk for fall due to :  No Fall Risks No Fall Risks    Follow up Falls evaluation completed Falls evaluation completed Falls evaluation completed Falls evaluation completed Falls evaluation completed    FALL RISK PREVENTION PERTAINING TO THE HOME: Home free of loose  throw rugs in walkways, pet beds, electrical cords, etc? Yes  Adequate lighting in your home to reduce risk of falls? Yes   ASSISTIVE DEVICES UTILIZED TO PREVENT FALLS: Life alert? No  Use of a cane, walker or w/c? No   TIMED UP AND GO: Was the test performed? No.   Cognitive Function: Patient is alert and oriented x3.     05/28/2017   11:35 AM  MMSE - Mini Mental State Exam  Orientation to time 5  Orientation to Place 5  Registration 3  Attention/ Calculation 5  Recall 3  Language- name 2 objects 2  Language- repeat 1  Language- follow 3 step command 3  Language- read & follow direction 1  Write a sentence 1  Copy design 1  Total score 30        08/20/2019    9:40 AM 06/01/2018   10:36 AM  6CIT Screen  What Year? 0 points 0 points  What month? 0 points 0 points  What time? 0 points 0 points  Count back from 20 0 points 0 points  Months in reverse 0 points 0 points  Repeat phrase 0 points 0 points  Total Score 0 points 0 points    Immunizations Immunization History  Administered Date(s) Administered   Fluad Quad(high Dose 65+) 02/28/2020   Influenza, High Dose Seasonal PF 02/24/2018, 03/15/2019   Influenza,inj,Quad PF,6+ Mos 03/03/2013   Influenza-Unspecified 01/11/2014, 02/13/2015, 02/27/2016, 03/04/2017, 02/25/2021   Moderna Covid-19 Vaccine Bivalent Booster 75yrs & up 02/25/2021   PFIZER Comirnaty(Gray Top)Covid-19 Tri-Sucrose Vaccine 11/09/2020   PFIZER(Purple Top)SARS-COV-2 Vaccination 07/02/2019, 07/23/2019, 02/28/2020   Pneumococcal Conjugate-13 03/03/2013   Pneumococcal Polysaccharide-23 08/14/2015   Tdap 03/03/2008    TDAP status: Due, Education has been provided regarding the importance of this vaccine. Advised may receive this vaccine at local pharmacy or Health Dept. Aware to provide a copy of the vaccination record if obtained from local pharmacy or Health Dept. Verbalized acceptance and understanding.Deferred.    Shingrix Completed?: No.     Education has been provided regarding the importance of this vaccine. Patient has been advised to call insurance company to determine out of pocket expense if they have not yet received this vaccine. Advised may also receive vaccine at local pharmacy or Health Dept. Verbalized acceptance and understanding.  Screening Tests Health Maintenance  Topic Date Due   TETANUS/TDAP  09/09/2021 (Originally 03/03/2018)   Zoster Vaccines- Shingrix (1 of 2) 11/29/2021 (Originally 08/31/1997)   FOOT EXAM  09/08/2021   HEMOGLOBIN A1C  10/15/2021   INFLUENZA VACCINE  12/11/2021   OPHTHALMOLOGY EXAM  06/13/2022   MAMMOGRAM  10/18/2022   COLONOSCOPY (Pts 45-20yrs Insurance coverage will need to be confirmed)  12/12/2025   Pneumonia Vaccine 69+ Years old  Completed   DEXA SCAN  Completed   COVID-19 Vaccine  Completed   Hepatitis C Screening  Completed   HPV VACCINES  Aged Out    Health Maintenance  There are no preventive care reminders to display for this patient.  Lung Cancer Screening: (Low Dose CT Chest recommended if Age 75-80 years, 30 pack-year currently smoking OR have quit w/in 15years.) does not qualify.   Vision Screening: Recommended annual ophthalmology exams for early detection of glaucoma and other disorders of the eye.  Dental Screening: Recommended annual dental exams for proper oral hygiene  Community Resource Referral / Chronic Care Management: CRR required this visit?  No   CCM required this visit?  No      Plan:   Keep all routine maintenance appointments.   I have personally reviewed and noted the following in the patient's chart:   Medical and social history Use of alcohol, tobacco or illicit drugs  Current medications and supplements including opioid prescriptions.  Functional ability and status Nutritional status Physical activity Advanced directives List of other physicians Hospitalizations, surgeries, and ER visits in previous 12 months Vitals Screenings to  include cognitive, depression, and falls Referrals and appointments  In addition, I have reviewed and discussed with patient certain preventive protocols, quality metrics, and best practice recommendations. A written personalized care plan for preventive services as  well as general preventive health recommendations were provided to patient.     OBrien-Blaney, Zitlaly Malson L, LPN   1/61/0960    / I have reviewed the above information and agree with above.   Duncan Dull, MD

## 2021-09-03 DIAGNOSIS — H5789 Other specified disorders of eye and adnexa: Secondary | ICD-10-CM | POA: Diagnosis not present

## 2021-09-03 DIAGNOSIS — H02834 Dermatochalasis of left upper eyelid: Secondary | ICD-10-CM | POA: Diagnosis not present

## 2021-09-03 DIAGNOSIS — H02831 Dermatochalasis of right upper eyelid: Secondary | ICD-10-CM | POA: Diagnosis not present

## 2021-09-04 ENCOUNTER — Encounter: Payer: Self-pay | Admitting: Cardiology

## 2021-09-19 ENCOUNTER — Other Ambulatory Visit: Payer: Self-pay | Admitting: Internal Medicine

## 2021-09-19 ENCOUNTER — Ambulatory Visit (INDEPENDENT_AMBULATORY_CARE_PROVIDER_SITE_OTHER): Payer: PPO | Admitting: Internal Medicine

## 2021-09-19 ENCOUNTER — Encounter: Payer: Self-pay | Admitting: Internal Medicine

## 2021-09-19 VITALS — BP 120/58 | HR 64 | Temp 97.8°F | Ht 64.0 in | Wt 191.2 lb

## 2021-09-19 DIAGNOSIS — L408 Other psoriasis: Secondary | ICD-10-CM

## 2021-09-19 DIAGNOSIS — I890 Lymphedema, not elsewhere classified: Secondary | ICD-10-CM

## 2021-09-19 DIAGNOSIS — I2510 Atherosclerotic heart disease of native coronary artery without angina pectoris: Secondary | ICD-10-CM | POA: Diagnosis not present

## 2021-09-19 DIAGNOSIS — E1169 Type 2 diabetes mellitus with other specified complication: Secondary | ICD-10-CM

## 2021-09-19 DIAGNOSIS — D649 Anemia, unspecified: Secondary | ICD-10-CM | POA: Insufficient documentation

## 2021-09-19 DIAGNOSIS — Z23 Encounter for immunization: Secondary | ICD-10-CM

## 2021-09-19 DIAGNOSIS — Z7901 Long term (current) use of anticoagulants: Secondary | ICD-10-CM | POA: Diagnosis not present

## 2021-09-19 DIAGNOSIS — D6869 Other thrombophilia: Secondary | ICD-10-CM | POA: Diagnosis not present

## 2021-09-19 DIAGNOSIS — Z1231 Encounter for screening mammogram for malignant neoplasm of breast: Secondary | ICD-10-CM

## 2021-09-19 DIAGNOSIS — I10 Essential (primary) hypertension: Secondary | ICD-10-CM | POA: Diagnosis not present

## 2021-09-19 DIAGNOSIS — I480 Paroxysmal atrial fibrillation: Secondary | ICD-10-CM

## 2021-09-19 DIAGNOSIS — K429 Umbilical hernia without obstruction or gangrene: Secondary | ICD-10-CM

## 2021-09-19 DIAGNOSIS — E785 Hyperlipidemia, unspecified: Secondary | ICD-10-CM

## 2021-09-19 DIAGNOSIS — R195 Other fecal abnormalities: Secondary | ICD-10-CM

## 2021-09-19 DIAGNOSIS — E1140 Type 2 diabetes mellitus with diabetic neuropathy, unspecified: Secondary | ICD-10-CM

## 2021-09-19 DIAGNOSIS — Z9861 Coronary angioplasty status: Secondary | ICD-10-CM

## 2021-09-19 LAB — CBC WITH DIFFERENTIAL/PLATELET
Basophils Absolute: 0.1 10*3/uL (ref 0.0–0.1)
Basophils Relative: 0.9 % (ref 0.0–3.0)
Eosinophils Absolute: 0.2 10*3/uL (ref 0.0–0.7)
Eosinophils Relative: 3 % (ref 0.0–5.0)
HCT: 34 % — ABNORMAL LOW (ref 36.0–46.0)
Hemoglobin: 11.8 g/dL — ABNORMAL LOW (ref 12.0–15.0)
Lymphocytes Relative: 18.3 % (ref 12.0–46.0)
Lymphs Abs: 1.5 10*3/uL (ref 0.7–4.0)
MCHC: 34.8 g/dL (ref 30.0–36.0)
MCV: 92.2 fl (ref 78.0–100.0)
Monocytes Absolute: 0.6 10*3/uL (ref 0.1–1.0)
Monocytes Relative: 7.4 % (ref 3.0–12.0)
Neutro Abs: 5.7 10*3/uL (ref 1.4–7.7)
Neutrophils Relative %: 70.4 % (ref 43.0–77.0)
Platelets: 241 10*3/uL (ref 150.0–400.0)
RBC: 3.68 Mil/uL — ABNORMAL LOW (ref 3.87–5.11)
RDW: 15.5 % (ref 11.5–15.5)
WBC: 8 10*3/uL (ref 4.0–10.5)

## 2021-09-19 LAB — B12 AND FOLATE PANEL
Folate: >23.5 ng/mL (ref >5.9)
Vitamin B-12: 979 pg/mL — ABNORMAL HIGH (ref 211–911)

## 2021-09-19 LAB — COMPREHENSIVE METABOLIC PANEL
ALT: 35 U/L (ref 0–35)
AST: 38 U/L — ABNORMAL HIGH (ref 0–37)
Albumin: 3.8 g/dL (ref 3.5–5.2)
Alkaline Phosphatase: 81 U/L (ref 39–117)
BUN: 13 mg/dL (ref 6–23)
CO2: 27 mEq/L (ref 19–32)
Calcium: 9.1 mg/dL (ref 8.4–10.5)
Chloride: 90 mEq/L — ABNORMAL LOW (ref 96–112)
Creatinine, Ser: 0.74 mg/dL (ref 0.40–1.20)
GFR: 79.91 mL/min (ref >60.00)
Glucose, Bld: 101 mg/dL — ABNORMAL HIGH (ref 70–99)
Potassium: 4.1 mEq/L (ref 3.5–5.1)
Sodium: 125 mEq/L — ABNORMAL LOW (ref 135–145)
Total Bilirubin: 0.9 mg/dL (ref 0.2–1.2)
Total Protein: 7 g/dL (ref 6.0–8.3)

## 2021-09-19 LAB — LIPID PANEL
Cholesterol: 153 mg/dL (ref 0–200)
HDL: 44.8 mg/dL (ref >39.00)
LDL Cholesterol: 84 mg/dL (ref 0–99)
NonHDL: 108.57
Total CHOL/HDL Ratio: 3
Triglycerides: 122 mg/dL (ref 0.0–149.0)
VLDL: 24.4 mg/dL (ref 0.0–40.0)

## 2021-09-19 LAB — HEMOGLOBIN A1C: Hgb A1c MFr Bld: 5.4 % (ref 4.6–6.5)

## 2021-09-19 LAB — TSH: TSH: 1.72 u[IU]/mL (ref 0.35–5.50)

## 2021-09-19 LAB — LDL CHOLESTEROL, DIRECT: Direct LDL: 88 mg/dL

## 2021-09-19 MED ORDER — DIFLORASONE DIACETATE 0.05 % EX CREA: 1.0000 "application " | TOPICAL_CREAM | Freq: Two times a day (BID) | CUTANEOUS | 0 refills | Status: DC | PRN

## 2021-09-19 MED ORDER — TETANUS-DIPHTH-ACELL PERTUSSIS 5-2.5-18.5 LF-MCG/0.5 IM SUSY: 0.5000 mL | PREFILLED_SYRINGE | Freq: Once | INTRAMUSCULAR | 0 refills | Status: AC

## 2021-09-19 MED ORDER — ZOSTER VAC RECOMB ADJUVANTED 50 MCG/0.5ML IM SUSR
0.5000 mL | Freq: Once | INTRAMUSCULAR | 1 refills | Status: AC
Start: 2021-09-19 — End: 2021-09-19

## 2021-09-19 NOTE — Progress Notes (Signed)
Patient ID: Kari [name], female    DOB: [birth year]  Age: 76 y.o. MRN: [id number] ? ?The patient is here for follow up and  management of other chronic and acute problems. ?  ?The risk factors are reflected in the social history. ? ?The roster of all physicians providing medical care to patient - is listed in the Snapshot section of the chart. ? ?Activities of daily living:  The patient is 100% independent in all ADLs: dressing, toileting, feeding as well as independent mobility ? ?Home safety : The patient has smoke detectors in the home. They wear seatbelts.  There are no firearms at home. There is no violence in the home.  ? ?There is no risks for hepatitis, STDs or HIV. There is no   history of blood transfusion. They have no travel history to infectious disease endemic areas of the world. ? ?The patient has seen their dentist in the last six month. They have seen their eye doctor in the last year. They admit to slight hearing difficulty with regard to whispered voices and some television programs.  They have deferred audiologic testing in the last year.  They do not  have excessive sun exposure. Discussed the need for sun protection: hats, long sleeves and use of sunscreen if there is significant sun exposure.  ? ?Diet: the importance of a healthy diet is discussed. They do have a healthy diet. ? ?The benefits of regular aerobic exercise were discussed. She leads a sedentary life.  ? ?Depression screen: there are no signs or vegative symptoms of depression- irritability, change in appetite, anhedonia, sadness/tearfullness. ? ?Cognitive assessment: the patient manages all their financial and personal affairs and is actively engaged. They could relate day,date,year and events; recalled 2/3 objects at 3 minutes; performed clock-face test normally. ? ?The following portions of the patient's history were reviewed and updated as appropriate: allergies, current medications, past family history, past medical history,   past surgical history, past social history  and problem list. ? ?Visual acuity was not assessed per patient preference since she has regular follow up with her ophthalmologist. Hearing and body mass index were assessed and reviewed.  ? ?During the course of the visit the patient was educated and counseled about appropriate screening and preventive services including : fall prevention , diabetes screening, nutrition counseling, colorectal cancer screening, and recommended immunizations.   ? ?CC: The primary encounter diagnosis was Essential hypertension. Diagnoses of Hyperlipidemia associated with type 2 diabetes mellitus ([place]), Type 2 diabetes mellitus with diabetic neuropathy, without long-term current use of insulin ([place]), Encounter for screening mammogram for malignant neoplasm of breast, Long term current use of anticoagulant, Anemia, unspecified type, Umbilical hernia without obstruction and without gangrene, Need for pneumococcal 20-valent conjugate vaccination, CAD S/P percutaneous coronary angioplasty, Hypercoagulable state due to paroxysmal atrial fibrillation ([place]), Lymphedema, and PSORIASIS were also pertinent to this visit. ? ? ?1) feels cold all the time.  No rigors or fevers  ? ?2) anemia noted by cardiology : no workup done ? ?3) CAD:  incidentally found during workup for atrial fib.  Has declined sleep study rec by pulmonologist  ?History ?Kari [name] has a past medical history of Allergy, Arthritis, Cataract, COPD (chronic obstructive pulmonary disease) with chronic bronchitis ([place]), Coronary artery disease involving native coronary artery of native heart with other form of angina pectoris ([place]), Depression, Diastolic dysfunction, GERD (gastroesophageal reflux disease), adenomatous colonic polyps (05/30/2010), Hyperlipidemia, Hypertension, Lymphedema of both lower extremities, Menopause, Migraines, Miscarriage, Moderate persistent asthma, and PONV (postoperative nausea  and vomiting).  ? ?She has a past  surgical history that includes Tubal ligation (1980); Cesarean section; Dental surgery (09/2016); Colonoscopy; Polypectomy; Cataract extraction, bilateral; transthoracic echocardiogram (11/04/2019); LEFT HEART CATH AND CORONARY ANGIOGRAPHY (Left, 04/30/2021); and CORONARY STENT INTERVENTION (N/A, 04/30/2021).  ? ?Her family history includes Cancer (age of onset: 73) in her sister; Cervical cancer in her mother; Colon cancer (age of onset: 41) in her sister; Coronary artery disease in her father; Heart Problems in her brother and sister; Heart attack in her paternal grandfather; Heart attack (age of onset: 108) in her father; Heart disease in her father; Myasthenia gravis in her mother; Valvular heart disease in her father.She reports that she quit smoking about 50 years ago. Her smoking use included cigarettes. She has never used smokeless tobacco. She reports that she does not currently use alcohol. She reports that she does not use drugs. ? ?Outpatient Medications Prior to Visit  ?Medication Sig Dispense Refill  ? albuterol (VENTOLIN HFA) 108 (90 Base) MCG/ACT inhaler Inhale 2 puffs by mouth into the lungs every 6 hours as needed for wheezing or shortness of breath 8.5 g 0  ? allopurinol (ZYLOPRIM) 300 MG tablet Take 1 tablet by mouth daily 90 tablet 0  ? apixaban (ELIQUIS) 5 MG TABS tablet Take 1 tablet (5 mg total) by mouth 2 (two) times daily. 180 tablet 0  ? atorvastatin (LIPITOR) 10 MG tablet Take 1 tablet by mouth daily 90 tablet 0  ? benzonatate (TESSALON) 200 MG capsule Take 1 capsule (200 mg total) by mouth 3 (three) times daily as needed for cough. 60 capsule 1  ? calcium carbonate (OS-CAL) 600 MG tablet Take 600 mg by mouth daily.    ? cetirizine (ZYRTEC) 10 MG tablet Take 10 mg by mouth daily.    ? Cinnamon 500 MG TABS Take 500 mg by mouth daily.    ? clopidogrel (PLAVIX) 75 MG tablet Take 1 tablet (75 mg total) by mouth daily with breakfast. 90 tablet 3  ? colchicine 0.6 MG tablet Take 1 tablet (0.6 mg  total) by mouth 2 (two) times daily. As needed for gout flare. 60 tablet 2  ? fluticasone-salmeterol (ADVAIR DISKUS) 250-50 MCG/ACT AEPB TAKE 1 PUFF BY MOUTH TWICE A DAY 180 each 3  ? furosemide (LASIX) 20 MG tablet Take 20 mg by mouth as needed.    ? hydrochlorothiazide (HYDRODIURIL) 25 MG tablet Take 1 tablet by mouth daily 90 tablet 0  ? losartan (COZAAR) 50 MG tablet Take 1 tablet by mouth at bedtime 90 tablet 0  ? Lutein 20 MG TABS Take 20 mg by mouth daily.    ? Magnesium 250 MG TABS Take 250 mg by mouth daily.    ? metoprolol tartrate (LOPRESSOR) 25 MG tablet Take 1 tablet by mouth twice a day 180 tablet 0  ? mirtazapine (REMERON) 45 MG tablet Take 1 tablet by mouth at bedtime 90 tablet 0  ? montelukast (SINGULAIR) 10 MG tablet Take 1 tablet by mouth at bedtime 90 tablet 0  ? Multiple Vitamins-Minerals (CENTRUM MINIS WOMEN 50+) TABS Take 1 tablet by mouth daily.    ? Omega-3 Fatty Acids (FISH OIL) 1000 MG CAPS Take 1,000 mg by mouth daily.    ? OVER THE COUNTER MEDICATION Place 2 sprays into both nostrils at bedtime. Nasocort nasal spray    ? pantoprazole (PROTONIX) 40 MG tablet Take 1 tablet (40 mg total) by mouth daily. 90 tablet 3  ? Probiotic Product (PROBIOTIC PO) Take 1 tablet  by mouth daily.    ? traZODone (DESYREL) 50 MG tablet Take 0.5-1 tablets (25-50 mg total) by mouth at bedtime as needed for sleep. 90 tablet 1  ? triamcinolone lotion (KENALOG) 0.1 % Apply 1 application topically 2 (two) times daily as needed.    ? diflorasone (PSORCON) 0.05 % cream Apply 1 application topically 2 (two) times daily as needed.    ? mirtazapine (REMERON) 45 MG tablet Take 1 tablet (45 mg total) by mouth at bedtime. 90 tablet 1  ? ?No facility-administered medications prior to visit.  ? ? ?Review of Systems ? ?Objective:  ?BP (!) 120/58 (BP Location: Left Arm, Patient Position: Sitting, Cuff Size: Large)   Pulse 64   Temp 97.8 ?F (36.6 ?C) (Oral)   Ht _0  (1.626 m)   Wt 191 lb 3.2 oz (86.7 kg)   SpO2 97%    BMI 32.82 kg/m?  ? ?Physical Exam ? ?Physical Exam  ? ?Assessment & Plan:  ? ?Problem List Items Addressed This Visit   ? ? Essential hypertension - Primary (Chronic)  ? Relevant Orders  ? Comp Met (CMET) (Comp

## 2021-09-19 NOTE — Patient Instructions (Addendum)
Workup for anemia has begun ? ?Your received your last pneumonia vaccine you will ever need today ? ? ?The Tdap (tetanus-diphtheria-whooping cough vaccine ) and Shingrix vaccines are now  COVERED BY MEDICARE if you get them at your pharmacy as of  January 1.  ? ?You can expect 24 hours of flu like symptoms after receiving the shingles vaccine, so plan accordingly.   ? ?Please start walking daily for exercise   ? ?Please obtain a sample of your stool for the home fecal occult blood test ? ? ?

## 2021-09-19 NOTE — Assessment & Plan Note (Signed)
Present for one year,  Unchanged.  History of C section 45 years ago ?

## 2021-09-19 NOTE — Assessment & Plan Note (Signed)
Historically  well-controlled on diet alone .  hemoglobin A1c has been consistently at or  less than 7.0 . Patient is up-to-date on eye exams and foot exam has been done today and is normal .  She continues to report mild numbness without skin changes  today. Patient has no microalbuminuria. Patient is tolerating statin therapy for CAD risk reduction and on ACE/ARB for renal protection and hypertension  ? ?Lab Results  ?Component Value Date  ? HGBA1C 5.8 04/16/2021  ? ?Lab Results  ?Component Value Date  ? MICROALBUR <0.7 04/16/2021  ? MICROALBUR <0.7 06/10/2018  ? ? ? ?

## 2021-09-19 NOTE — Assessment & Plan Note (Addendum)
etiology unclear:  normal levels of  iron,  b12 and folate. Has been taking plavix and eliquis since Dec 2022 .  FOBT IS POSITIVE.  Referring to dr Carlean Purl for evaluation  Lab Results  Component Value Date   IRON 67 09/19/2021   TIBC 276 09/19/2021   FERRITIN 115 09/19/2021   Lab Results  Component Value Date   VITAMINB12 979 (H) 09/19/2021   Lab Results  Component Value Date   FOLATE >23.5 09/19/2021

## 2021-09-20 ENCOUNTER — Other Ambulatory Visit: Payer: Self-pay | Admitting: Internal Medicine

## 2021-09-20 LAB — IRON,TIBC AND FERRITIN PANEL
%SAT: 24 % (calc) (ref 16–45)
Ferritin: 115 ng/mL (ref 16–288)
Iron: 67 ug/dL (ref 45–160)
TIBC: 276 mcg/dL (calc) (ref 250–450)

## 2021-09-20 MED ORDER — BETAMETHASONE DIPROPIONATE 0.05 % EX CREA: TOPICAL_CREAM | Freq: Two times a day (BID) | CUTANEOUS | 0 refills | Status: DC

## 2021-09-20 NOTE — Assessment & Plan Note (Signed)
Historically  well-controlled on diet alone .  hemoglobin A1c has been consistently at or  less than 7.0 . Patient is up-to-date on eye exams and foot exam has been done today and is normal .  She continues to report mild numbness without skin changes  today. Patient has no microalbuminuria. Patient is tolerating statin therapy for CAD risk reduction and on ACE/ARB for renal protection and hypertension  ? ?Lab Results  ?Component Value Date  ? HGBA1C 5.4 09/19/2021  ? ?Lab Results  ?Component Value Date  ? MICROALBUR <0.7 04/16/2021  ? MICROALBUR <0.7 06/10/2018  ? ? ? ?

## 2021-09-20 NOTE — Assessment & Plan Note (Signed)
Managed with leg elevation ; has deferred lymph pumps  ?

## 2021-09-20 NOTE — Assessment & Plan Note (Signed)
High potency steroid cream refilled with a covered alternative  ?

## 2021-09-20 NOTE — Assessment & Plan Note (Signed)
She is asymptomatic ,  Needs to remain on plavix uninterrupted until late June ?

## 2021-09-20 NOTE — Assessment & Plan Note (Signed)
Continue lifelong Eliquis for CHA2DSVASC2 score of 5  ?

## 2021-09-23 ENCOUNTER — Other Ambulatory Visit: Payer: Self-pay | Admitting: Internal Medicine

## 2021-09-23 DIAGNOSIS — E871 Hypo-osmolality and hyponatremia: Secondary | ICD-10-CM | POA: Insufficient documentation

## 2021-09-23 NOTE — Progress Notes (Signed)
Your sodium level is quite compared to previous levels;  please suspend the hctz and plan to repeat your sodium level in a week. The rest of your labs are unremarkable.

## 2021-09-23 NOTE — Assessment & Plan Note (Signed)
Suspending hcta.  Repeat soidum level in one week  ?

## 2021-09-25 ENCOUNTER — Telehealth: Payer: Self-pay

## 2021-09-25 NOTE — Telephone Encounter (Signed)
LMTCB in regards to lab results. Need to schedule pt a lab appt in 1 week after stopping the Hydrochlorothiazide.  ?

## 2021-09-25 NOTE — Telephone Encounter (Signed)
-----   Message from [name], MD sent at 09/23/2021  9:15 PM EDT ----- ?Your sodium level is quite compared to previous levels;  please suspend the hctz and plan to repeat your sodium level in a week. The rest of your labs are unremarkable.   ?

## 2021-09-27 ENCOUNTER — Telehealth: Payer: Self-pay

## 2021-09-27 NOTE — Telephone Encounter (Addendum)
   Pre-operative Risk Assessment    Patient Name: Kari [name]  DOB: 1945-06-05 MRN: [id number]      Request for Surgical Clearance    Procedure:   Bilateral Upper Eyelid Blepharoplasty  Date of Surgery:  Clearance TBD                                 Surgeon:  Dr. Yvetta Coder  Surgeon's Group or Practice Name:  [facility] Phone number:  [phone] Fax number:  [phone]   Type of Clearance Requested:   - Pharmacy:  Hold Clopidogrel (Plavix) and Apixaban (Eliquis) instructions needed    Type of Anesthesia:  Not Indicated    Additional requests/questions:    Kari [name]   09/27/2021, 3:06 PM

## 2021-09-28 NOTE — Telephone Encounter (Signed)
   Patient Name: Kari [name]  DOB: 04/22/1946 MRN: [id number]  Primary Cardiologist: Glenetta Hew, MD  Chart reviewed as part of pre-operative protocol coverage for eyelid surgery.  Per Dr. Allison Quarry OV 06/2021, patient had most recent PCI 04/30/21 and the plan was to likely discontinue Plavix after 6 months post-PCI. The patient wrote in saying she was going to have to be off of Plavix for a total of 12 days, 10 before surgery and 2 after (as well as off Eliquis 2 days before and 2 days after) around time of surgery. Also has hx AF, HLD, DM, obesity. Per her note, eyelid surgery is corrective, not cosmetic. I will route to Dr. Ellyn Hack for input on holding Plavix for this duration / ? Delaying until the 6 month mark post-PCI. Dr. Ellyn Hack - Please route response to [facility] PREOP (the pre-op pool). Thank you.  Will also route to pharm for input on [place].   Since last OV 06/2021, anticipate virtual visit to review with pt after anticoag clarified.   Charlie Pitter, PA-C 09/28/2021, 2:24 PM

## 2021-09-28 NOTE — Telephone Encounter (Signed)
Patient with diagnosis of afib on Eliquis for anticoagulation.    Procedure: Bilateral Upper Eyelid Blepharoplasty Date of procedure: TBD  CHA2DS2-VASc Score = 5  This indicates a 7.2% annual risk of stroke. The patient's score is based upon: CHF History: 0 HTN History: 1 Diabetes History: 1 Stroke History: 0 Vascular Disease History: 1 Age Score: 1 Gender Score: 1   CrCl 15m/min using adjusted body weight due to obesity Platelet count 241K  Per office protocol, patient can hold Eliquis for 2 days prior to procedure. Ok to resume 2 days post op.

## 2021-09-28 NOTE — Telephone Encounter (Signed)
Noted, await input from Dr. Ellyn Hack (see my note routed below)

## 2021-09-30 NOTE — Telephone Encounter (Signed)
So per my original plan, I was hoping for 6 months uninterrupted Plavix.  Technically that day was 09/28/2021.  Let us try to make things easier with baby being in the month and put in my note 1 July as opposed to 1 June mistakenly.  The plan was to go to Eliquis monotherapy after that 47-monthPlavix double coverage. Based on this, she should be okay totally stopping Plavix which would mean she is only holding Eliquis 2 days preop and 2 days postop.  Reasonable  DGlenetta Hew MD

## 2021-10-01 ENCOUNTER — Telehealth: Payer: Self-pay | Admitting: Internal Medicine

## 2021-10-01 LAB — FECAL OCCULT BLOOD, IMMUNOCHEMICAL: Fecal Occult Bld: POSITIVE — AB

## 2021-10-01 NOTE — Addendum Note (Signed)
Addended by: [name] on: 10/01/2021 04:38 PM   Modules accepted: Orders

## 2021-10-01 NOTE — Telephone Encounter (Signed)
Left message to call back for tele pre op appt 

## 2021-10-01 NOTE — Telephone Encounter (Signed)
Primary Cardiologist:David Ellyn Hack, MD  Chart reviewed as part of pre-operative protocol coverage. Because of Kari [name] past medical history and time since last visit, he/she will require a virtual visit/telephone call in order to better assess preoperative cardiovascular risk.  Pre-op covering staff: - Please contact patient, obtain consent, and schedule appointment   If applicable, this message will also be routed to pharmacy pool and/or primary cardiologist for input on holding anticoagulant/antiplatelet agent as requested below so that this information is available at time of patient's appointment.    Emmaline Life, NP-C    10/01/2021, 8:23 AM [facility] [address] Office [phone] Fax [phone]

## 2021-10-01 NOTE — Telephone Encounter (Signed)
CRITICAL VALUE STICKER  CRITICAL VALUE: Positive I FOB  RECEIVER (on-site recipient of call): Sharee Pimple  DATE & TIME NOTIFIED: 10/01/2021 08:20am  MESSENGER (representative from lab): Santiago Glad  MD NOTIFIED: Dr [name]  TIME OF NOTIFICATION: 8:23am  RESPONSE:

## 2021-10-01 NOTE — Addendum Note (Signed)
Addended by: [name] on: 10/01/2021 04:39 PM   Modules accepted: Orders

## 2021-10-02 ENCOUNTER — Telehealth: Payer: Self-pay

## 2021-10-02 NOTE — Telephone Encounter (Signed)
  Patient Consent for Virtual Visit        Kari [name] has provided verbal consent on 10/02/2021 for a virtual visit (video or telephone).   CONSENT FOR VIRTUAL VISIT FOR:  Kari [name]  By participating in this virtual visit I agree to the following:  I hereby voluntarily request, consent and authorize [facility] and its employed or contracted physicians, physician assistants, nurse practitioners or other licensed health care professionals (the Practitioner), to provide me with telemedicine health care services (the "Services") as deemed necessary by the treating Practitioner. I acknowledge and consent to receive the Services by the Practitioner via telemedicine. I understand that the telemedicine visit will involve communicating with the Practitioner through live audiovisual communication technology and the disclosure of certain medical information by electronic transmission. I acknowledge that I have been given the opportunity to request an in-person assessment or other available alternative prior to the telemedicine visit and am voluntarily participating in the telemedicine visit.  I understand that I have the right to withhold or withdraw my consent to the use of telemedicine in the course of my care at any time, without affecting my right to future care or treatment, and that the Practitioner or I may terminate the telemedicine visit at any time. I understand that I have the right to inspect all information obtained and/or recorded in the course of the telemedicine visit and may receive copies of available information for a reasonable fee.  I understand that some of the potential risks of receiving the Services via telemedicine include:  Delay or interruption in medical evaluation due to technological equipment failure or disruption; Information transmitted may not be sufficient (e.g. poor resolution of images) to allow for appropriate medical decision making by the Practitioner; and/or   In rare instances, security protocols could fail, causing a breach of personal health information.  Furthermore, I acknowledge that it is my responsibility to provide information about my medical history, conditions and care that is complete and accurate to the best of my ability. I acknowledge that Practitioner's advice, recommendations, and/or decision may be based on factors not within their control, such as incomplete or inaccurate data provided by me or distortions of diagnostic images or specimens that may result from electronic transmissions. I understand that the practice of medicine is not an exact science and that Practitioner makes no warranties or guarantees regarding treatment outcomes. I acknowledge that a copy of this consent can be made available to me via my patient portal ([facility]), or I can request a printed copy by calling the office of [facility].    I understand that my insurance will be billed for this visit.   I have read or had this consent read to me. I understand the contents of this consent, which adequately explains the benefits and risks of the Services being provided via telemedicine.  I have been provided ample opportunity to ask questions regarding this consent and the Services and have had my questions answered to my satisfaction. I give my informed consent for the services to be provided through the use of telemedicine in my medical care

## 2021-10-02 NOTE — Telephone Encounter (Signed)
Pt is scheduled for a tele preop visit on 5/23. Consent and med rec reviewed

## 2021-10-03 ENCOUNTER — Ambulatory Visit (INDEPENDENT_AMBULATORY_CARE_PROVIDER_SITE_OTHER): Payer: PPO | Admitting: General Practice

## 2021-10-03 ENCOUNTER — Encounter: Payer: Self-pay | Admitting: General Practice

## 2021-10-03 DIAGNOSIS — Z01810 Encounter for preprocedural cardiovascular examination: Secondary | ICD-10-CM | POA: Diagnosis not present

## 2021-10-03 NOTE — Progress Notes (Signed)
Virtual Visit via Telephone Note   Because of Kari [name] co-morbid illnesses, she is at least at moderate risk for complications without adequate follow up.  This format is felt to be most appropriate for this patient at this time.  The patient did not have access to video technology/had technical difficulties with video requiring transitioning to audio format only (telephone).  All issues noted in this document were discussed and addressed.  No physical exam could be performed with this format.  Please refer to the patient's chart for her consent to telehealth for [facility].  Evaluation Performed:  Preoperative cardiovascular risk assessment _____________   Date:  10/03/2021   Patient ID:  Kari [name], DOB Oct 30, 1945, MRN [id number] Patient Location:  Home Provider location:   Office  Primary Care Provider:  [name], MD Primary Cardiologist:  Glenetta Hew, MD  Chief Complaint / Patient Profile   76 y.o. y/o female with a h/o CAD, HTN, HLD, paroxysmal A-fib who is pending bilateral upper eyelid blepharoplasty and presents today for telephonic preoperative cardiovascular risk assessment.  Past Medical History    Past Medical History:  Diagnosis Date   Allergy    to cat, dogs, pollen, dust, cigarette smoke   Arthritis    feet   Cataract    removed both eyes   COPD (chronic obstructive pulmonary disease) with chronic bronchitis (HCC)    And moderate persistent asthma; complicated by Restrictive Lung Disease from obesity   Coronary artery disease involving native coronary artery of native heart with other form of angina pectoris ([place])    a. 04/2021 Cor CTA: Cor Ca2+ 389. Domninant RCA-RPDA<PLA. RCA >70p (abnl CTFFR), LAD 50-69p, LCX Min dz; b. 04/2021 Cath/PCI: LM nl, LAD mild diff dzs, D1/2/3 nl, LCX min irregs, OM1/2/3 nl, RCA 99p (2.75x30 Onyx Frontier DES).   Depression    Diastolic dysfunction    a. 04/2021 Echo: EF 60-65%, GrI DD. Nl RV fxn. Mildly dil  LA.   GERD (gastroesophageal reflux disease)    Hx of adenomatous colonic polyps 05/30/2010   Hyperlipidemia    Hypertension    Lymphedema of both lower extremities    No superficial venous reflux noted.  However right-sided saphenofemoral junction reflux noted and left common femoral vein reflux noted.  No DVT or superficial thrombus.   Menopause    Migraines    Miscarriage    X 1   Moderate persistent asthma    PONV (postoperative nausea and vomiting)    Past Surgical History:  Procedure Laterality Date   CATARACT EXTRACTION, BILATERAL     CESAREAN SECTION     X 2   COLONOSCOPY     CORONARY STENT INTERVENTION N/A 04/30/2021   Procedure: CORONARY STENT INTERVENTION;  Surgeon: Wellington Hampshire, MD;  Location: [facility];  Service: Cardiovascular;  Proximal RCA 99% => DES PCI with Onyx Frontier 2.75 mm x 30 mm   DENTAL SURGERY  09/2016   LEFT HEART CATH AND CORONARY ANGIOGRAPHY Left 04/30/2021   Procedure: LEFT HEART CATH AND CORONARY ANGIOGRAPHY;  Surgeon: Wellington Hampshire, MD;  Location: [facility];  Service: Cardiovascular;; Proximal RCA 99% => DES PCI;  [place]; mild diffuse LAD and minimal diffuse LCx disease.  Moderately elevated EDP (22 mmHg).  EF 55-65 %.   POLYPECTOMY     TRANSTHORACIC ECHOCARDIOGRAM  11/04/2019   a) EF 55 to 60%.  Normal LV size and function.  GR 1 DD.  Unable to assess.  Mild  LA dilation.  Normal aortic and mitral valve.  Normal RVP.  (Normal study);; b) 04/2021: Normal LV size and function.  EF 60 to 65%.  No RWMA.  GR 1 DD.Normal RV size and function.  Normal aortic and mitral valves.   TUBAL LIGATION  1980    Allergies  Allergies  Allergen Reactions   Nifedipine     REACTION: swollen feet    History of Present Illness    Kari [name] is a 76 y.o. female who presents via audio/video conferencing for a telehealth visit today.  Pt was last seen in cardiology clinic on 06/28/2021 by Dr. Ellyn Hack.  At that time Kari [name] was doing  well .  The patient is now pending procedure as outlined above. Since her last visit, she remained stable from a cardiac standpoint.  Today she denies chest pain, shortness of breath, lower extremity edema, fatigue, palpitations, melena, hematuria, hemoptysis, diaphoresis, weakness, presyncope, syncope, orthopnea, and PND.  Home Medications    Prior to Admission medications   Medication Sig Start Date End Date Taking? Authorizing Provider  albuterol (VENTOLIN HFA) 108 (90 Base) MCG/ACT inhaler Inhale 2 puffs by mouth into the lungs every 6 hours as needed for wheezing or shortness of breath 10/24/20   [name], MD  allopurinol (ZYLOPRIM) 300 MG tablet Take 1 tablet by mouth daily 07/05/21   [name], MD  apixaban (ELIQUIS) 5 MG TABS tablet Take 1 tablet (5 mg total) by mouth 2 (two) times daily. 06/28/21   Leonie Man, MD  atorvastatin (LIPITOR) 10 MG tablet Take 1 tablet by mouth daily 07/05/21   [name], MD  benzonatate (TESSALON) 200 MG capsule Take 1 capsule (200 mg total) by mouth 3 (three) times daily as needed for cough. 09/22/19   [name], MD  betamethasone dipropionate 0.05 % cream Apply topically 2 (two) times daily. 09/20/21   [name], MD  calcium carbonate (OS-CAL) 600 MG tablet Take 600 mg by mouth daily.    [provider]  cetirizine (ZYRTEC) 10 MG tablet Take 10 mg by mouth daily.    [provider]  Cinnamon 500 MG TABS Take 500 mg by mouth daily.    [provider]  clopidogrel (PLAVIX) 75 MG tablet Take 1 tablet (75 mg total) by mouth daily with breakfast. 05/01/21   Theora Gianotti, NP  colchicine 0.6 MG tablet Take 1 tablet (0.6 mg total) by mouth 2 (two) times daily. As needed for gout flare. Patient not taking: Reported on 10/02/2021 12/11/18   [name], MD  fluticasone-salmeterol (ADVAIR DISKUS) 250-50 MCG/ACT AEPB TAKE 1 PUFF BY MOUTH TWICE A DAY 08/30/21   [name], MD  furosemide  (LASIX) 20 MG tablet Take 20 mg by mouth as needed. Patient not taking: Reported on 10/02/2021    [provider]  losartan (COZAAR) 50 MG tablet Take 1 tablet by mouth at bedtime 07/05/21   [name], MD  Lutein 20 MG TABS Take 20 mg by mouth daily.    [provider]  Magnesium 250 MG TABS Take 250 mg by mouth daily.    [provider]  metoprolol tartrate (LOPRESSOR) 25 MG tablet Take 1 tablet by mouth twice a day 05/02/21   Leonie Man, MD  mirtazapine (REMERON) 45 MG tablet Take 1 tablet by mouth at bedtime 06/12/21   [name], MD  montelukast (SINGULAIR) 10 MG tablet Take 1 tablet by mouth  at bedtime 07/05/21   [name], MD  Multiple Vitamins-Minerals (CENTRUM MINIS WOMEN 50+) TABS Take 1 tablet by mouth daily.    [provider]  Omega-3 Fatty Acids (FISH OIL) 1000 MG CAPS Take 1,000 mg by mouth daily.    [provider]  OVER THE COUNTER MEDICATION Place 2 sprays into both nostrils at bedtime. Nasocort nasal spray    [provider]  pantoprazole (PROTONIX) 40 MG tablet Take 1 tablet (40 mg total) by mouth daily. 05/01/21   Theora Gianotti, NP  Probiotic Product (PROBIOTIC PO) Take 1 tablet by mouth daily.    [provider]  traZODone (DESYREL) 50 MG tablet Take 0.5-1 tablets (25-50 mg total) by mouth at bedtime as needed for sleep. 06/12/21   [name], MD  triamcinolone lotion (KENALOG) 0.1 % Apply 1 application topically 2 (two) times daily as needed.    [provider]  mometasone (NASONEX) 50 MCG/ACT nasal spray Place 2 sprays into the nose daily. 01/02/11 07/24/11  [name], MD    Physical Exam    Vital Signs:  Tiphani Mells does not have vital signs available for review today.  Given telephonic nature of communication, physical exam is limited. AAOx3. NAD. Normal affect.  Speech and respirations are unlabored.  Accessory Clinical Findings    None  Assessment &  Plan    1.  Preoperative Cardiovascular Risk Assessment:  Primary Cardiologist: Glenetta Hew, MD  Chart reviewed as part of pre-operative protocol coverage. Given past medical history and time since last visit, based on ACC/AHA guidelines, Aleenah Homen would be at acceptable risk for the planned procedure without further cardiovascular testing.   Patient was advised that if she develops new symptoms prior to surgery to contact our office to arrange a follow-up appointment.  He verbalized understanding.  Patient with diagnosis of afib on Eliquis for anticoagulation.     Procedure: Bilateral Upper Eyelid Blepharoplasty Date of procedure: TBD   CHA2DS2-VASc Score = 5  This indicates a 7.2% annual risk of stroke. The patient's score is based upon: CHF History: 0 HTN History: 1 Diabetes History: 1 Stroke History: 0 Vascular Disease History: 1 Age Score: 1 Gender Score: 1   CrCl 83m/min using adjusted body weight due to obesity Platelet count 241K   Per office protocol, patient can hold Eliquis for 2 days prior to procedure. Ok to resume 2 days post op.  Her Plavix may be discontinued.  Please have her continue Eliquis postoperatively.  A copy of this note will be routed to requesting surgeon.  Time:   Today, I have spent 7 minutes with the patient with telehealth technology discussing medical history, symptoms, and management plan.  .  Prior to her phone evaluation I spent greater than 10 minutes reviewing her past medical history and medications.   JDeberah Pelton NP  10/03/2021, 1:09 PM

## 2021-10-03 NOTE — Progress Notes (Signed)
Attempted to contact patient as part of preoperative protocol.  3 attempts made.  3 messages left.  Patient instructed to contact office to reschedule preoperative phone evaluation.  Jossie Ng. Jaysean Manville NP-C    10/03/2021, 11:44 AM [place] [address] Office [phone] Fax [phone]

## 2021-10-03 NOTE — Progress Notes (Signed)
NOTES HAVE BEEN FAXED TO REQUESTING OFFICE  

## 2021-10-03 NOTE — Progress Notes (Signed)
I have s/w the pt and she stated she works in [employer], [employer] and they had an emergency and she was not able to answer her phone. Pt kept apologizing. I did schedule her for later today at 4 pm as it looks like the schedule was open for 4 pm today. I will update the pre op prvider as well, appt later today. Pt thanked me for the help and our understanding.

## 2021-10-09 ENCOUNTER — Other Ambulatory Visit: Payer: Self-pay

## 2021-10-09 MED ORDER — METOPROLOL TARTRATE 25 MG PO TABS
25.0000 mg | ORAL_TABLET | Freq: Two times a day (BID) | ORAL | 0 refills | Status: DC
Start: 2021-10-09 — End: 2022-01-24

## 2021-10-09 NOTE — Telephone Encounter (Signed)
Refill sent for Metoprolol Tart 25 mg

## 2021-10-12 ENCOUNTER — Other Ambulatory Visit: Payer: Self-pay

## 2021-10-12 DIAGNOSIS — M109 Gout, unspecified: Secondary | ICD-10-CM

## 2021-10-12 MED ORDER — ATORVASTATIN CALCIUM 10 MG PO TABS: 10.0000 mg | ORAL_TABLET | Freq: Every day | ORAL | 3 refills | Status: DC

## 2021-10-12 MED ORDER — LOSARTAN POTASSIUM 50 MG PO TABS: 50.0000 mg | ORAL_TABLET | Freq: Every day | ORAL | 3 refills | Status: DC

## 2021-10-12 MED ORDER — MONTELUKAST SODIUM 10 MG PO TABS: 10.0000 mg | ORAL_TABLET | Freq: Every day | ORAL | 3 refills | Status: DC

## 2021-10-12 MED ORDER — ALLOPURINOL 300 MG PO TABS: 300.0000 mg | ORAL_TABLET | Freq: Every day | ORAL | 3 refills | Status: DC

## 2021-10-16 ENCOUNTER — Other Ambulatory Visit (INDEPENDENT_AMBULATORY_CARE_PROVIDER_SITE_OTHER): Payer: PPO

## 2021-10-16 DIAGNOSIS — E871 Hypo-osmolality and hyponatremia: Secondary | ICD-10-CM

## 2021-10-16 DIAGNOSIS — Z1231 Encounter for screening mammogram for malignant neoplasm of breast: Secondary | ICD-10-CM

## 2021-10-16 LAB — COMPREHENSIVE METABOLIC PANEL
ALT: 30 U/L (ref 0–35)
AST: 34 U/L (ref 0–37)
Albumin: 3.5 g/dL (ref 3.5–5.2)
Alkaline Phosphatase: 68 U/L (ref 39–117)
BUN: 15 mg/dL (ref 6–23)
CO2: 25 mEq/L (ref 19–32)
Calcium: 9 mg/dL (ref 8.4–10.5)
Chloride: 102 mEq/L (ref 96–112)
Creatinine, Ser: 0.72 mg/dL (ref 0.40–1.20)
GFR: 82.54 mL/min (ref >60.00)
Glucose, Bld: 96 mg/dL (ref 70–99)
Potassium: 3.9 mEq/L (ref 3.5–5.1)
Sodium: 135 mEq/L (ref 135–145)
Total Bilirubin: 0.8 mg/dL (ref 0.2–1.2)
Total Protein: 6.3 g/dL (ref 6.0–8.3)

## 2021-10-18 ENCOUNTER — Encounter: Payer: Self-pay | Admitting: Internal Medicine

## 2021-11-06 ENCOUNTER — Telehealth: Payer: Self-pay | Admitting: Cardiology

## 2021-11-06 DIAGNOSIS — I480 Paroxysmal atrial fibrillation: Secondary | ICD-10-CM

## 2021-11-06 MED ORDER — APIXABAN 5 MG PO TABS: 5.0000 mg | ORAL_TABLET | Freq: Two times a day (BID) | ORAL | 0 refills | Status: DC

## 2021-11-08 DIAGNOSIS — H02831 Dermatochalasis of right upper eyelid: Secondary | ICD-10-CM | POA: Diagnosis not present

## 2021-11-08 DIAGNOSIS — H02834 Dermatochalasis of left upper eyelid: Secondary | ICD-10-CM | POA: Diagnosis not present

## 2021-11-22 ENCOUNTER — Ambulatory Visit (INDEPENDENT_AMBULATORY_CARE_PROVIDER_SITE_OTHER): Payer: PPO | Admitting: Internal Medicine

## 2021-11-22 ENCOUNTER — Encounter: Payer: Self-pay | Admitting: Internal Medicine

## 2021-11-22 VITALS — BP 142/68 | HR 70 | Ht 64.0 in | Wt 189.0 lb

## 2021-11-22 DIAGNOSIS — D649 Anemia, unspecified: Secondary | ICD-10-CM

## 2021-11-22 DIAGNOSIS — Z7901 Long term (current) use of anticoagulants: Secondary | ICD-10-CM

## 2021-11-22 DIAGNOSIS — R195 Other fecal abnormalities: Secondary | ICD-10-CM

## 2021-11-22 NOTE — Patient Instructions (Signed)
Great to see you today.   Have a good summer.  I appreciate the opportunity to care for you. Silvano Rusk, MD, [facility]

## 2021-11-22 NOTE — Progress Notes (Signed)
Kari [name] 76 y.o. 1945/06/06 [id number]  Assessment & Plan:   Encounter Diagnoses  Name Primary?   Heme + stool - iFOBT Yes   Long term current use of anticoagulant therapy - apixaban    Normocytic anemia - mild - NL B12, Folate and ferritin     Because of iFOBT positive stool not clear.  This testing is limited to the colon.  The patient and I both suspect hemorrhoids that she has had problems with those in the past and essentially everybody has hemorrhoids and she was on an antiplatelet agent and an anticoagulant at the time of the positive test.  Because of mild anemia not clear there are no deficiencies.  The pattern of iron and TIBC is close to being chronic disease anemia with both of these being on the low normal side but saturation is okay so not clear.  We discussed potential evaluation.  Neither she nor I are in favor of repeat colonoscopy and she is not having any upper GI signs or symptoms and she prefers not to have an endoscopy.  She does understand there is a possibility of a missed neoplasm or cancer though seems very unlikely.  She will see me as needed.  Certainly if she has overt signs of bleeding or other GI problems she has been instructed to call for an appointment.  I appreciate the opportunity to care for this patient. CC: [name], MD   Subjective:   Chief Complaint: Heme positive stool and mild anemia  HPI 76 year old white woman with a history of colon polyps and family history of colon cancer with negative colonoscopy 2022, also history of A-fib and prior PCI had been on clopidogrel and Eliquis.  Clopidogrel was discontinued in late May.  The patient had a mild anemia discovered through primary care and had a work-up with normal iron B12 and folate studies but an iFOBT test was positive.  She denies any rectal bleeding or melena.  Hemoglobins as below and other studies as well.  Upper GI review of systems is also negative.    Latest Ref Rng  & Units 09/19/2021   11:14 AM 05/11/2021    2:52 PM 05/01/2021    5:05 AM  CBC  WBC 4.0 - 10.5 K/uL 8.0  9.9  8.0   Hemoglobin 12.0 - 15.0 g/dL 11.8  10.9  10.4   Hematocrit 36.0 - 46.0 % 34.0  31.5  30.0   Platelets 150.0 - 400.0 K/uL 241.0  279  220    Lab Results  Component Value Date   IRON 67 09/19/2021   TIBC 276 09/19/2021   FERRITIN 115 09/19/2021   Lab Results  Component Value Date   WIOXBDZH29 924 (H) 09/19/2021   Lab Results  Component Value Date   FOLATE >23.5 09/19/2021  Colonoscopy 12/12/2020 - Diverticulosis in the sigmoid colon. - Medium-sized lipoma in the descending colon. - The examination was otherwise normal. - No specimens collected. Personal hx adenomas x 2 diminutive 2012, sister had CRCA Allergies  Allergen Reactions   Hydrochlorothiazide Other (See Comments)    Severe hyponatremia    Nifedipine     REACTION: swollen feet   Current Meds  Medication Sig   albuterol (VENTOLIN HFA) 108 (90 Base) MCG/ACT inhaler Inhale 2 puffs by mouth into the lungs every 6 hours as needed for wheezing or shortness of breath   allopurinol (ZYLOPRIM) 300 MG tablet Take 1 tablet (300 mg total) by mouth daily.   apixaban (ELIQUIS)  5 MG TABS tablet Take 1 tablet (5 mg total) by mouth 2 (two) times daily.   atorvastatin (LIPITOR) 10 MG tablet Take 1 tablet (10 mg total) by mouth daily.   benzonatate (TESSALON) 200 MG capsule Take 1 capsule (200 mg total) by mouth 3 (three) times daily as needed for cough.   betamethasone dipropionate 0.05 % cream Apply topically 2 (two) times daily.   calcium carbonate (OS-CAL) 600 MG tablet Take 600 mg by mouth daily.   cetirizine (ZYRTEC) 10 MG tablet Take 10 mg by mouth daily.   Cinnamon 500 MG TABS Take 500 mg by mouth daily.   colchicine 0.6 MG tablet Take 1 tablet (0.6 mg total) by mouth 2 (two) times daily. As needed for gout flare.   fluticasone-salmeterol (ADVAIR DISKUS) 250-50 MCG/ACT AEPB TAKE 1 PUFF BY MOUTH TWICE A DAY    furosemide (LASIX) 20 MG tablet Take 20 mg by mouth as needed.   losartan (COZAAR) 50 MG tablet Take 1 tablet (50 mg total) by mouth at bedtime.   Lutein 20 MG TABS Take 20 mg by mouth daily.   Magnesium 250 MG TABS Take 250 mg by mouth daily.   metoprolol tartrate (LOPRESSOR) 25 MG tablet Take 1 tablet (25 mg total) by mouth 2 (two) times daily.   mirtazapine (REMERON) 45 MG tablet Take 1 tablet by mouth at bedtime   montelukast (SINGULAIR) 10 MG tablet Take 1 tablet (10 mg total) by mouth at bedtime.   Multiple Vitamins-Minerals (CENTRUM MINIS WOMEN 50+) TABS Take 1 tablet by mouth daily.   Omega-3 Fatty Acids (FISH OIL) 1000 MG CAPS Take 1,000 mg by mouth daily.   OVER THE COUNTER MEDICATION Place 2 sprays into both nostrils at bedtime. Nasocort nasal spray   pantoprazole (PROTONIX) 40 MG tablet Take 1 tablet (40 mg total) by mouth daily.   Probiotic Product (PROBIOTIC PO) Take 1 tablet by mouth daily.   traZODone (DESYREL) 50 MG tablet Take 0.5-1 tablets (25-50 mg total) by mouth at bedtime as needed for sleep.   triamcinolone lotion (KENALOG) 0.1 % Apply 1 application topically 2 (two) times daily as needed.   Past Medical History:  Diagnosis Date   Allergy    to cat, dogs, pollen, dust, cigarette smoke   Arthritis    feet   Cataract    removed both eyes   COPD (chronic obstructive pulmonary disease) with chronic bronchitis (HCC)    And moderate persistent asthma; complicated by Restrictive Lung Disease from obesity   Coronary artery disease involving native coronary artery of native heart with other form of angina pectoris ([place])    a. 04/2021 Cor CTA: Cor Ca2+ 389. Domninant RCA-RPDA<PLA. RCA >70p (abnl CTFFR), LAD 50-69p, LCX Min dz; b. 04/2021 Cath/PCI: LM nl, LAD mild diff dzs, D1/2/3 nl, LCX min irregs, OM1/2/3 nl, RCA 99p (2.75x30 Onyx Frontier DES).   Depression    Diastolic dysfunction    a. 04/2021 Echo: EF 60-65%, GrI DD. Nl RV fxn. Mildly dil LA.   GERD (gastroesophageal  reflux disease)    Hx of adenomatous colonic polyps 05/30/2010   Hyperlipidemia    Hypertension    Lymphedema of both lower extremities    No superficial venous reflux noted.  However right-sided saphenofemoral junction reflux noted and left common femoral vein reflux noted.  No DVT or superficial thrombus.   Menopause    Migraines    Miscarriage    X 1   Moderate persistent asthma    PONV (postoperative  nausea and vomiting)    Past Surgical History:  Procedure Laterality Date   CATARACT EXTRACTION, BILATERAL     CESAREAN SECTION     X 2   COLONOSCOPY     CORONARY STENT INTERVENTION N/A 04/30/2021   Procedure: CORONARY STENT INTERVENTION;  Surgeon: Wellington Hampshire, MD;  Location: [facility];  Service: Cardiovascular;  Proximal RCA 99% => DES PCI with Onyx Frontier 2.75 mm x 30 mm   DENTAL SURGERY  09/2016   LEFT HEART CATH AND CORONARY ANGIOGRAPHY Left 04/30/2021   Procedure: LEFT HEART CATH AND CORONARY ANGIOGRAPHY;  Surgeon: Wellington Hampshire, MD;  Location: [facility];  Service: Cardiovascular;; Proximal RCA 99% => DES PCI;  [place]; mild diffuse LAD and minimal diffuse LCx disease.  Moderately elevated EDP (22 mmHg).  EF 55-65 %.   POLYPECTOMY     TRANSTHORACIC ECHOCARDIOGRAM  11/04/2019   a) EF 55 to 60%.  Normal LV size and function.  GR 1 DD.  Unable to assess.  Mild LA dilation.  Normal aortic and mitral valve.  Normal RVP.  (Normal study);; b) 04/2021: Normal LV size and function.  EF 60 to 65%.  No RWMA.  GR 1 DD.Normal RV size and function.  Normal aortic and mitral valves.   TUBAL LIGATION  1980   Social History   Social History Narrative   Married, works full-time for a [employer]   Former smoker no alcohol tobacco or drug use at this time   family history includes Cancer (age of onset: 47) in her sister; Cervical cancer in her mother; Colon cancer (age of onset: 38) in her sister; Coronary artery disease in her father; Heart Problems in her brother  and sister; Heart attack in her paternal grandfather; Heart attack (age of onset: 86) in her father; Heart disease in her father; Myasthenia gravis in her mother; Valvular heart disease in her father.   Review of Systems As per HPI  Objective:   Physical Exam BP (!) 142/68   Pulse 70   Ht '5\' 4"'$  (1.626 m)   Wt 189 lb (85.7 kg)   BMI 32.44 kg/m

## 2021-12-13 ENCOUNTER — Encounter: Payer: Self-pay | Admitting: Cardiology

## 2021-12-13 ENCOUNTER — Ambulatory Visit: Payer: PPO | Admitting: Cardiology

## 2021-12-13 VITALS — BP 148/69 | HR 55 | Ht 64.0 in | Wt 191.8 lb

## 2021-12-13 DIAGNOSIS — Z9861 Coronary angioplasty status: Secondary | ICD-10-CM

## 2021-12-13 DIAGNOSIS — D6869 Other thrombophilia: Secondary | ICD-10-CM | POA: Diagnosis not present

## 2021-12-13 DIAGNOSIS — I2510 Atherosclerotic heart disease of native coronary artery without angina pectoris: Secondary | ICD-10-CM | POA: Diagnosis not present

## 2021-12-13 DIAGNOSIS — E66811 Obesity, class 1: Secondary | ICD-10-CM

## 2021-12-13 DIAGNOSIS — E1169 Type 2 diabetes mellitus with other specified complication: Secondary | ICD-10-CM | POA: Diagnosis not present

## 2021-12-13 DIAGNOSIS — I480 Paroxysmal atrial fibrillation: Secondary | ICD-10-CM | POA: Diagnosis not present

## 2021-12-13 DIAGNOSIS — I10 Essential (primary) hypertension: Secondary | ICD-10-CM | POA: Diagnosis not present

## 2021-12-13 DIAGNOSIS — E785 Hyperlipidemia, unspecified: Secondary | ICD-10-CM | POA: Diagnosis not present

## 2021-12-13 DIAGNOSIS — E669 Obesity, unspecified: Secondary | ICD-10-CM

## 2021-12-13 NOTE — Patient Instructions (Addendum)
Medication Instructions:  - Your physician recommends that you continue on your current medications as directed. Please refer to the Current Medication list given to you today.  *If you need a refill on your cardiac medications before your next appointment, please call your pharmacy*   Lab Work: - none ordered  If you have labs (blood work) drawn today and your tests are completely normal, you will receive your results only by: [place] (if you have MyChart) OR A paper copy in the mail If you have any lab test that is abnormal or we need to change your treatment, we will call you to review the results.   Testing/Procedures: - none ordered   Follow-Up: At [facility], you and your health needs are our priority.  As part of our continuing mission to provide you with exceptional heart care, we have created designated Provider Care Teams.  These Care Teams include your primary Cardiologist (physician) and Advanced Practice Providers (APPs -  Physician Assistants and Nurse Practitioners) who all work together to provide you with the care you need, when you need it.  We recommend signing up for the patient portal called "MyChart".  Sign up information is provided on this After Visit Summary.  MyChart is used to connect with patients for Virtual Visits (Telemedicine).  Patients are able to view lab/test results, encounter notes, upcoming appointments, etc.  Non-urgent messages can be sent to your provider as well.   To learn more about what you can do with MyChart, go to [email or web address].    Your next appointment:   6 month(s)  The format for your next appointment:   In Person  Provider:   Glenetta Hew, MD    Other Instructions  Keep a blood pressure log for 2 weeks prior to seeing your Primary Care Doctor   How to Take Your Blood Pressure Blood pressure is a measurement of how strongly your blood is pressing against the walls of your arteries. Arteries are blood  vessels that carry blood from your heart throughout your body. Your health care provider takes your blood pressure at each office visit. You can also take your own blood pressure at home with a blood pressure monitor. You may need to take your own blood pressure to: Confirm a diagnosis of high blood pressure (hypertension). Monitor your blood pressure over time. Make sure your blood pressure medicine is working. Supplies needed: Blood pressure monitor. A chair to sit in. This should be a chair where you can sit upright with your back supported. Do not sit on a soft couch or an armchair. Table or desk. Small notebook and pencil or pen. How to prepare To get the most accurate reading, avoid the following for 30 minutes before you check your blood pressure: Drinking caffeine. Drinking alcohol. Eating. Smoking. Exercising. Five minutes before you check your blood pressure: Use the bathroom and urinate so that you have an empty bladder. Sit quietly in a chair. Do not talk. How to take your blood pressure To check your blood pressure, follow the instructions in the manual that came with your blood pressure monitor. If you have a digital blood pressure monitor, the instructions may be as follows: Sit up straight in a chair. Place your feet on the floor. Do not cross your ankles or legs. Rest your left arm at the level of your heart on a table or desk or on the arm of a chair. Pull up your shirt sleeve. Wrap the blood pressure cuff around the  upper part of your left arm, 1 inch (2.5 cm) above your elbow. It is best to wrap the cuff around bare skin. Fit the cuff snugly, but not too tightly, around your arm. You should be able to place only one finger between the cuff and your arm. Position the cord so that it rests in the bend of your elbow. Press the power button. Sit quietly while the cuff inflates and deflates. Read the digital reading on the monitor screen and write the numbers down  (record them) in a notebook. Wait 2-3 minutes, then repeat the steps, starting at step 1. What does my blood pressure reading mean? A blood pressure reading consists of a higher number over a lower number. Ideally, your blood pressure should be below 120/80. The first ("top") number is called the systolic pressure. It is a measure of the pressure in your arteries as your heart beats. The second ("bottom") number is called the diastolic pressure. It is a measure of the pressure in your arteries as the heart relaxes. Blood pressure is classified into four stages. The following are the stages for adults who do not have a short-term serious illness or a chronic condition. Systolic pressure and diastolic pressure are measured in a unit called mm Hg (millimeters of mercury).  Normal Systolic pressure: below 761. Diastolic pressure: below 80. Elevated Systolic pressure: 950-932. Diastolic pressure: below 80. Hypertension stage 1 Systolic pressure: 671-245. Diastolic pressure: 80-99. Hypertension stage 2 Systolic pressure: 833 or above. Diastolic pressure: 90 or above. You can have elevated blood pressure or hypertension even if only the systolic or only the diastolic number in your reading is higher than normal. Follow these instructions at home: Medicines Take over-the-counter and prescription medicines only as told by your health care provider. Tell your health care provider if you are having any side effects from blood pressure medicine. General instructions Check your blood pressure as often as recommended by your health care provider. Check your blood pressure at the same time every day. Take your monitor to the next appointment with your health care provider to make sure that: You are using it correctly. It provides accurate readings. Understand what your goal blood pressure numbers are. Keep all follow-up visits. This is important. General tips Your health care provider can suggest a  reliable monitor that will meet your needs. There are several types of home blood pressure monitors. Choose a monitor that has an arm cuff. Do not choose a monitor that measures your blood pressure from your wrist or finger. Choose a cuff that wraps snugly, not too tight or too loose, around your upper arm. You should be able to fit only one finger between your arm and the cuff. You can buy a blood pressure monitor at most drugstores or online. Where to find more information American Heart Association: www.heart.org Contact a health care provider if: Your blood pressure is consistently high. Your blood pressure is suddenly low. Get help right away if: Your systolic blood pressure is higher than 180. Your diastolic blood pressure is higher than 120. These symptoms may be an emergency. Get help right away. Call 911. Do not wait to see if the symptoms will go away. Do not drive yourself to the hospital. Summary Blood pressure is a measurement of how strongly your blood is pressing against the walls of your arteries. A blood pressure reading consists of a higher number over a lower number. Ideally, your blood pressure should be below 120/80. Check your blood pressure at  the same time every day. Avoid caffeine, alcohol, smoking, and exercise for 30 minutes prior to checking your blood pressure. These agents can affect the accuracy of the blood pressure reading. This information is not intended to replace advice given to you by your health care provider. Make sure you discuss any questions you have with your health care provider. Document Revised: 01/11/2021 Document Reviewed: 01/11/2021 Elsevier Patient Education  Kari [name]

## 2021-12-13 NOTE — Progress Notes (Signed)
Primary Care Provider: [name], MD Cardiologist: Glenetta Hew, MD Electrophysiologist: None  Clinic Note: Chief Complaint  Patient presents with   Follow-up    6 month follow up. Patient states that she is feeling fine today. Patient states that since she has been off the hydrochlorothiazide her right ankle swells up at the end of day, about 3-4 days a week.   Meds reviewed with patient.    Coronary Artery Disease    No angina   Atrial Fibrillation    No breakthrough  ===================================  ASSESSMENT/PLAN   Problem List Items Addressed This Visit       Cardiology Problems   CAD S/P percutaneous coronary angioplasty (Chronic)    RCA PCI back in December 2022.-No further chest pain or pressure.  Completed 6 months of Plavix now on Eliquis alone.  On beta-blocker and ARB. On atorvastatin 10 mg which is the most she can handle.->  Lipids not quite at goal. BP up after stopping HCTZ.  I have asked that she monitor her pressures at home, if they continue to increase potentially with her PCP she should have her losartan dose increased to 100 mg.      Relevant Orders   EKG 12-Lead   Paroxysmal atrial fibrillation with rapid ventricular response (HCC); CHA2DS2-VASc score 5 (HTN, DM-2, Age, Female, Aortic Plaque) - Primary (Chronic)    No breakthrough episodes. Remains on Lopressor 25 mg twice daily with adequate rate control. She would not want to study in the past and is therefore still working on weight loss.  Plan: Continue Lopressor for rate control and Eliquis for DOAC.  No longer on Plavix, doing well.      Relevant Orders   EKG 12-Lead   Hypercoagulable state due to paroxysmal atrial fibrillation (HCC) (Chronic)    Now on lifelong Eliquis for A-fib with a CHA2DS2-VASc score of 5.  (HTN, age 64, female 43, CAD, DM) almost 39 which would then make her score 6.  No longer on Plavix, therefore on Eliquis along.   Okay to hold Eliquis for procedures or  surgeries: 48 hours for minor procedures, 32 hours for major procedures.      Relevant Orders   EKG 12-Lead   Hyperlipidemia associated with type 2 diabetes mellitus (HCC) (Chronic)    A1c excellent at 5.4.  Appropriate on ARB-can decrease dose to 100 mg based on increased blood pressure, will wait until follow-up with PCP.  Unfortunately, her LDL is not at goal-at 84.  She very much wants to avoid titrating up medications. She is hoping that with weight loss she can do better.  Suspected we may need to convert to rosuvastatin from atorvastatin, and may be more aggressive. => She will be due for follow-up labs in roughly November timeframe and at goal, would strongly consider converting to rosuvastatin 20 mg daily.      Relevant Orders   EKG 12-Lead   Essential hypertension (Chronic)    BBC is trending up-likely related to having stopped HCTZ.  Due for follow-up with PCP-if still elevated would then probably increase ARB to 100 mg.  I also recommend that maybe she takes her furosemide a couple times a week to help with swelling and this will also blood pressure.  She does not Nestl want it every day, but 2 to 3 days a week will be reasonable.      Relevant Orders   EKG 12-Lead     Other   Obesity (BMI 30.0-34.9) (Chronic)  Remarkable weight loss over the years.  She is hoping that with weight loss can avoid titrating her medications. I congratulated her efforts.  Hopefully this will help her blood sugars and blood pressure and lipids.  For certain, her N9G has improved, but lipids still not clinical and BP going up since she stopped her HCTZ.  Congratulated her efforts at weight loss and recommend continuing to push.  Follow-up with PCP in the fall.      ===================================  HPI:    Kari [name] is a 76 y.o. female with a PMH notable for CAD-PCI, PAF, COPD, DM-2 (diet-controlled with PN), HLD, chronic venous stasis with lymphedema who presents today for 5-68-month follow-up.  She presents today at the request of TCrecencio Mc MD  CV History: November 2022: ER visit for A-fib RVR; spontaneously converted with IV fluids and diltiazem drip. => Noted chest pain and dyspnea associated with tachycardia Initial Consultation 04/11/2021: Evaluate with 2D Echo and Coronary CTA  1 V CAD12/01/2021 : Coronary CTA showed FFR positive proximal RCA stenosis (FFR ct 0.65) with moderate LAD (50 to 69%) and minimal (less than 24%) LCx disease. => Referred for Cardiac Cath-PCI Cath-PCI 99% prox RCA => DES PCI (2.75 mm x 30 mm ONYX FRONTIER DES)  I last saw ILucie Leatheron June 28, 2021 : No regular episodes of A-fib.  No chest pain or pressure with or without radiation: Noted with A-fib.   Virtual visit for preop with JColetta Memos NP on 10/03/2021-was relatively asymptomatic.  Surgery was bilateral upper eyelid blepharoplasty.  Okay to hold Eliquis.  Plavix was discontinued as of October 19, 2021.  (6 months after PCI).  Recent Hospitalizations: N/A  Reviewed  CV studies:    The following studies were reviewed today: (if available, images/films reviewed: From Epic Chart or Care Everywhere) None since December 2020:  Interval History:   IJacynda Brunkereturns for routine follow-up doing quite well.  Apparently issues that she has had some off-and-on leg swelling ever since her HCTZ was discontinued.  Review, and HCTZ discontinued due to hyponatremia.  She also notes her blood pressure is gone up since stopped.  Otherwise she is doing fine.  She been working on weight loss for the last couple years actually resolved the throat 1 day and by address and realized that she is delayed.  She has been making a concerted effort ever since.  She is down about 30 pounds in the last several years.  She feels a lot better energy levels a lot better.  Other than having some mild swelling no cardiac symptoms.  No chest pain or pressure with rest or exertion.  No PND, orthopnea.   Occasional lightheadedness dizziness but no syncope/syncope.  No TIA or amaurosis fugax.  No claudication.   REVIEWED OF SYSTEMS   Review of Systems  Constitutional:  Positive for weight loss. Negative for malaise/fatigue.  HENT:  Negative for nosebleeds.   Respiratory: Negative.    Cardiovascular:        Per HPI-mild leg swelling since stopping HCTZ also noted blood pressure going up.  Gastrointestinal:  Negative for blood in stool and melena.  Genitourinary:  Negative for flank pain and hematuria.  Musculoskeletal:  Positive for joint pain. Negative for falls and myalgias.  Neurological:  Negative for dizziness, loss of consciousness, weakness and headaches.  Psychiatric/Behavioral:  Negative for memory loss. The patient is not nervous/anxious and does not have insomnia.    - I have reviewed and (if needed)  personally updated the patient's problem list, medications, allergies, past medical and surgical history, social and family history.   PAST MEDICAL HISTORY   Past Medical History:  Diagnosis Date   Allergy    to cat, dogs, pollen, dust, cigarette smoke   Arthritis    feet   Cataract    removed both eyes   COPD (chronic obstructive pulmonary disease) with chronic bronchitis (HCC)    And moderate persistent asthma; complicated by Restrictive Lung Disease from obesity   Coronary artery disease involving native coronary artery of native heart with other form of angina pectoris ([place])    a. 04/2021 Cor CTA: Cor Ca2+ 389. Domninant RCA-RPDA<PLA. RCA >70p (abnl CTFFR), LAD 50-69p, LCX Min dz; b. 04/2021 Cath/PCI: LM nl, LAD mild diff dzs, D1/2/3 nl, LCX min irregs, OM1/2/3 nl, RCA 99p (2.75x30 Onyx Frontier DES).   Depression    Diastolic dysfunction    a. 04/2021 Echo: EF 60-65%, GrI DD. Nl RV fxn. Mildly dil LA.   GERD (gastroesophageal reflux disease)    Hx of adenomatous colonic polyps 05/30/2010   Hyperlipidemia    Hypertension    Lymphedema of both lower extremities    No  superficial venous reflux noted.  However right-sided saphenofemoral junction reflux noted and left common femoral vein reflux noted.  No DVT or superficial thrombus.   Menopause    Migraines    Miscarriage    X 1   Moderate persistent asthma    Obesity (BMI 30.0-34.9) 03/04/2013   PONV (postoperative nausea and vomiting)     PAST SURGICAL HISTORY   Past Surgical History:  Procedure Laterality Date   CATARACT EXTRACTION, BILATERAL     CESAREAN SECTION     X 2   COLONOSCOPY     CORONARY STENT INTERVENTION N/A 04/30/2021   Procedure: CORONARY STENT INTERVENTION;  Surgeon: Wellington Hampshire, MD;  Location: [facility];  Service: Cardiovascular;  Proximal RCA 99% => DES PCI with Onyx Frontier 2.75 mm x 30 mm   DENTAL SURGERY  09/2016   EYE SURGERY     eye lid surgery 11/2021   LEFT HEART CATH AND CORONARY ANGIOGRAPHY Left 04/30/2021   Procedure: LEFT HEART CATH AND CORONARY ANGIOGRAPHY;  Surgeon: Wellington Hampshire, MD;  Location: [facility];  Service: Cardiovascular;; Proximal RCA 99% => DES PCI;  Ony; mild diffuse LAD and minimal diffuse LCx disease.  Moderately elevated EDP (22 mmHg).  EF 55-65 %.   POLYPECTOMY     TRANSTHORACIC ECHOCARDIOGRAM  11/04/2019   a) EF 55 to 60%.  Normal LV size and function.  GR 1 DD.  Unable to assess.  Mild LA dilation.  Normal aortic and mitral valve.  Normal RVP.  (Normal study);; b) 04/2021: Normal LV size and function.  EF 60 to 65%.  No RWMA.  GR 1 DD.Normal RV size and function.  Normal aortic and mitral valves.   TUBAL LIGATION  1980    Immunization History  Administered Date(s) Administered   Fluad Quad(high Dose 65+) 02/28/2020   Influenza, High Dose Seasonal PF 02/24/2018, 03/15/2019   Influenza,inj,Quad PF,6+ Mos 03/03/2013   Influenza-Unspecified 01/11/2014, 02/13/2015, 02/27/2016, 03/04/2017, 02/25/2021   Moderna Covid-19 Vaccine Bivalent Booster 57yr & up 02/25/2021   PFIZER Comirnaty(Gray Top)Covid-19 Tri-Sucrose  Vaccine 11/09/2020   PFIZER(Purple Top)SARS-COV-2 Vaccination 07/02/2019, 07/23/2019, 02/28/2020   PNEUMOCOCCAL CONJUGATE-20 09/19/2021   Pneumococcal Conjugate-13 03/03/2013   Pneumococcal Polysaccharide-23 08/14/2015   Tdap 03/03/2008    MEDICATIONS/ALLERGIES   Current Meds  Medication Sig   albuterol (VENTOLIN HFA) 108 (90 Base) MCG/ACT inhaler Inhale 2 puffs by mouth into the lungs every 6 hours as needed for wheezing or shortness of breath   allopurinol (ZYLOPRIM) 300 MG tablet Take 1 tablet (300 mg total) by mouth daily.   apixaban (ELIQUIS) 5 MG TABS tablet Take 1 tablet (5 mg total) by mouth 2 (two) times daily.   atorvastatin (LIPITOR) 10 MG tablet Take 1 tablet (10 mg total) by mouth daily.   benzonatate (TESSALON) 200 MG capsule Take 1 capsule (200 mg total) by mouth 3 (three) times daily as needed for cough.   betamethasone dipropionate 0.05 % cream Apply topically 2 (two) times daily.   calcium carbonate (OS-CAL) 600 MG tablet Take 600 mg by mouth daily.   cetirizine (ZYRTEC) 10 MG tablet Take 10 mg by mouth daily.   Cinnamon 500 MG TABS Take 500 mg by mouth daily.   colchicine 0.6 MG tablet Take 1 tablet (0.6 mg total) by mouth 2 (two) times daily. As needed for gout flare.   fluticasone-salmeterol (ADVAIR DISKUS) 250-50 MCG/ACT AEPB TAKE 1 PUFF BY MOUTH TWICE A DAY   furosemide (LASIX) 20 MG tablet Take 20 mg by mouth as needed.   losartan (COZAAR) 50 MG tablet Take 1 tablet (50 mg total) by mouth at bedtime.   Lutein 20 MG TABS Take 20 mg by mouth daily.   Magnesium 250 MG TABS Take 250 mg by mouth daily.   metoprolol tartrate (LOPRESSOR) 25 MG tablet Take 1 tablet (25 mg total) by mouth 2 (two) times daily.   mirtazapine (REMERON) 45 MG tablet Take 1 tablet by mouth at bedtime   montelukast (SINGULAIR) 10 MG tablet Take 1 tablet (10 mg total) by mouth at bedtime.   Multiple Vitamins-Minerals (CENTRUM MINIS WOMEN 50+) TABS Take 1 tablet by mouth daily.   Omega-3 Fatty  Acids (FISH OIL) 1000 MG CAPS Take 1,000 mg by mouth daily.   OVER THE COUNTER MEDICATION Place 2 sprays into both nostrils at bedtime. Nasocort nasal spray   pantoprazole (PROTONIX) 40 MG tablet Take 1 tablet (40 mg total) by mouth daily.   Probiotic Product (PROBIOTIC PO) Take 1 tablet by mouth daily.   traZODone (DESYREL) 50 MG tablet Take 0.5-1 tablets (25-50 mg total) by mouth at bedtime as needed for sleep.   triamcinolone lotion (KENALOG) 0.1 % Apply 1 application topically 2 (two) times daily as needed.    Allergies  Allergen Reactions   Hydrochlorothiazide Other (See Comments)    Severe hyponatremia    Nifedipine     REACTION: swollen feet    SOCIAL HISTORY/FAMILY HISTORY   Reviewed in Epic:  Pertinent findings:  Social History   Tobacco Use   Smoking status: Former    Types: Cigarettes    Quit date: 01/02/1971    Years since quitting: 50.9   Smokeless tobacco: Never  Vaping Use   Vaping Use: Never used  Substance Use Topics   Alcohol use: Not Currently    Comment: occasional- wine gives pt headache only drinks once every 6 months   Drug use: No   Social History   Social History Narrative   Married, works full-time for a [employer]   Former smoker no alcohol tobacco or drug use at this time    OBJCTIVE -PE, EKG, labs   Wt Readings from Last 3 Encounters:  12/13/21 191 lb 12.8 oz (87 kg)  11/22/21 189 lb (85.7 kg)  09/19/21 191 lb 3.2 oz (  86.7 kg)  1219 2022-204 lb; 06/2021- 200 lb Physical Exam: BP (!) 148/69 (BP Location: Right Arm, Patient Position: Sitting, Cuff Size: Normal)   Pulse (!) 55   Ht '5\' 4"'$  (1.626 m)   Wt 191 lb 12.8 oz (87 kg)   SpO2 96%   BMI 32.92 kg/m  Physical Exam Constitutional:      General: She is not in acute distress.    Appearance: Normal appearance. She is obese. She is not ill-appearing or toxic-appearing.     Comments: Slow steady loss  HENT:     Head: Normocephalic and atraumatic.  Eyes:     Extraocular  Movements: Extraocular movements intact.     Pupils: Pupils are equal, round, and reactive to light.  Neck:     Vascular: No carotid bruit.  Cardiovascular:     Rate and Rhythm: Regular rhythm. Bradycardia present.     Pulses: Normal pulses.     Heart sounds: Normal heart sounds. No murmur heard.    No friction rub. No gallop.  Pulmonary:     Effort: Pulmonary effort is normal. No respiratory distress.     Breath sounds: Normal breath sounds. No wheezing, rhonchi or rales.  Chest:     Chest wall: No tenderness.  Musculoskeletal:        General: Swelling (Trivial) present. Normal range of motion.     Cervical back: Normal range of motion and neck supple. No rigidity or tenderness.  Lymphadenopathy:     Cervical: No cervical adenopathy.  Skin:    General: Skin is warm and dry.     Coloration: Skin is not jaundiced.  Neurological:     General: No focal deficit present.     Mental Status: She is alert and oriented to person, place, and time.     Cranial Nerves: No cranial nerve deficit.     Gait: Gait normal.  Psychiatric:        Mood and Affect: Mood normal.        Behavior: Behavior normal.        Thought Content: Thought content normal.        Judgment: Judgment normal.     Adult ECG Report  Rate: 55;  Rhythm: sinus bradycardia and Normal axis, intervals durations.  Cannot exclude anterior MI, age-indeterminate. ;   Narrative Interpretation: Otherwise normal  Recent Labs: Reviewed Lab Results  Component Value Date   CHOL 153 09/19/2021   HDL 44.80 09/19/2021   LDLCALC 84 09/19/2021   LDLDIRECT 88.0 09/19/2021   TRIG 122.0 09/19/2021   CHOLHDL 3 09/19/2021   Lab Results  Component Value Date   CREATININE 0.72 10/16/2021   BUN 15 10/16/2021   NA 135 10/16/2021   K 3.9 10/16/2021   CL 102 10/16/2021   CO2 25 10/16/2021      Latest Ref Rng & Units 09/19/2021   11:14 AM 05/11/2021    2:52 PM 05/01/2021    5:05 AM  CBC  WBC 4.0 - 10.5 K/uL 8.0  9.9  8.0    Hemoglobin 12.0 - 15.0 g/dL 11.8  10.9  10.4   Hematocrit 36.0 - 46.0 % 34.0  31.5  30.0   Platelets 150.0 - 400.0 K/uL 241.0  279  220     Lab Results  Component Value Date   HGBA1C 5.4 09/19/2021   Lab Results  Component Value Date   TSH 1.72 09/19/2021    ================================================== I spent a total of 19 minutes with the patient spent  in direct patient consultation.  Additional time spent with chart review  / charting (studies, outside notes, etc): 22 min Total Time: 41 min  Current medicines are reviewed at length with the patient today.  (+/- concerns) N/A  Notice: This dictation was prepared with Dragon dictation along with smart phrase technology. Any transcriptional errors that result from this process are unintentional and may not be corrected upon review.  Studies Ordered:  Orders Placed This Encounter  Procedures   EKG 12-Lead   No orders of the defined types were placed in this encounter.   Patient Instructions / Medication Changes & Studies & Tests Ordered   Patient Instructions  Medication Instructions:  - Your physician recommends that you continue on your current medications as directed. Please refer to the Current Medication list given to you today.  *If you need a refill on your cardiac medications before your next appointment, please call your pharmacy*   Lab Work: - none ordered  If you have labs (blood work) drawn today and your tests are completely normal, you will receive your results only by: [place] (if you have MyChart) OR A paper copy in the mail If you have any lab test that is abnormal or we need to change your treatment, we will call you to review the results.   Testing/Procedures: - none ordered   Follow-Up: At [facility], you and your health needs are our priority.  As part of our continuing mission to provide you with exceptional heart care, we have created designated Provider Care Teams.   These Care Teams include your primary Cardiologist (physician) and Advanced Practice Providers (APPs -  Physician Assistants and Nurse Practitioners) who all work together to provide you with the care you need, when you need it.  We recommend signing up for the patient portal called "MyChart".  Sign up information is provided on this After Visit Summary.  MyChart is used to connect with patients for Virtual Visits (Telemedicine).  Patients are able to view lab/test results, encounter notes, upcoming appointments, etc.  Non-urgent messages can be sent to your provider as well.   To learn more about what you can do with MyChart, go to [email or web address].    Your next appointment:   6 month(s)  The format for your next appointment:   In Person  Provider:   Glenetta Hew, MD    Other Instructions  Keep a blood pressure log for 2 weeks prior to seeing your Primary Care Doctor   How to Take Your Blood Pressure Blood pressure is a measurement of how strongly your blood is pressing against the walls of your arteries. Arteries are blood vessels that carry blood from your heart throughout your body. Your health care provider takes your blood pressure at each office visit. You can also take your own blood pressure at home with a blood pressure monitor. You may need to take your own blood pressure to: Confirm a diagnosis of high blood pressure (hypertension). Monitor your blood pressure over time. Make sure your blood pressure medicine is working. Supplies needed: Blood pressure monitor. A chair to sit in. This should be a chair where you can sit upright with your back supported. Do not sit on a soft couch or an armchair. Table or desk. Small notebook and pencil or pen. How to prepare To get the most accurate reading, avoid the following for 30 minutes before you check your blood pressure: Drinking caffeine. Drinking alcohol. Eating. Smoking. Exercising. Five minutes before you check  your blood pressure: Use the bathroom and urinate so that you have an empty bladder. Sit quietly in a chair. Do not talk. How to take your blood pressure To check your blood pressure, follow the instructions in the manual that came with your blood pressure monitor. If you have a digital blood pressure monitor, the instructions may be as follows: Sit up straight in a chair. Place your feet on the floor. Do not cross your ankles or legs. Rest your left arm at the level of your heart on a table or desk or on the arm of a chair. Pull up your shirt sleeve. Wrap the blood pressure cuff around the upper part of your left arm, 1 inch (2.5 cm) above your elbow. It is best to wrap the cuff around bare skin. Fit the cuff snugly, but not too tightly, around your arm. You should be able to place only one finger between the cuff and your arm. Position the cord so that it rests in the bend of your elbow. Press the power button. Sit quietly while the cuff inflates and deflates. Read the digital reading on the monitor screen and write the numbers down (record them) in a notebook. Wait 2-3 minutes, then repeat the steps, starting at step 1. What does my blood pressure reading mean? A blood pressure reading consists of a higher number over a lower number. Ideally, your blood pressure should be below 120/80. The first ("top") number is called the systolic pressure. It is a measure of the pressure in your arteries as your heart beats. The second ("bottom") number is called the diastolic pressure. It is a measure of the pressure in your arteries as the heart relaxes. Blood pressure is classified into four stages. The following are the stages for adults who do not have a short-term serious illness or a chronic condition. Systolic pressure and diastolic pressure are measured in a unit called mm Hg (millimeters of mercury).  Normal Systolic pressure: below 010. Diastolic pressure: below 80. Elevated Systolic pressure:  272-536. Diastolic pressure: below 80. Hypertension stage 1 Systolic pressure: 644-034. Diastolic pressure: 74-25. Hypertension stage 2 Systolic pressure: 956 or above. Diastolic pressure: 90 or above. You can have elevated blood pressure or hypertension even if only the systolic or only the diastolic number in your reading is higher than normal. Follow these instructions at home: Medicines Take over-the-counter and prescription medicines only as told by your health care provider. Tell your health care provider if you are having any side effects from blood pressure medicine. General instructions Check your blood pressure as often as recommended by your health care provider. Check your blood pressure at the same time every day. Take your monitor to the next appointment with your health care provider to make sure that: You are using it correctly. It provides accurate readings. Understand what your goal blood pressure numbers are. Keep all follow-up visits. This is important. General tips Your health care provider can suggest a reliable monitor that will meet your needs. There are several types of home blood pressure monitors. Choose a monitor that has an arm cuff. Do not choose a monitor that measures your blood pressure from your wrist or finger. Choose a cuff that wraps snugly, not too tight or too loose, around your upper arm. You should be able to fit only one finger between your arm and the cuff. You can buy a blood pressure monitor at most drugstores or online. Where to find more information American Heart Association: www.heart.org Contact  a health care provider if: Your blood pressure is consistently high. Your blood pressure is suddenly low. Get help right away if: Your systolic blood pressure is higher than 180. Your diastolic blood pressure is higher than 120. These symptoms may be an emergency. Get help right away. Call 911. Do not wait to see if the symptoms will go  away. Do not drive yourself to the hospital. Summary Blood pressure is a measurement of how strongly your blood is pressing against the walls of your arteries. A blood pressure reading consists of a higher number over a lower number. Ideally, your blood pressure should be below 120/80. Check your blood pressure at the same time every day. Avoid caffeine, alcohol, smoking, and exercise for 30 minutes prior to checking your blood pressure. These agents can affect the accuracy of the blood pressure reading. This information is not intended to replace advice given to you by your health care provider. Make sure you discuss any questions you have with your health care provider. Document Revised: 01/11/2021 Document Reviewed: 01/11/2021 Elsevier Patient Education  2023 Elsevier Inc.   Important Information About Sugar            Leonie Man, MD, MS Glenetta Hew, M.D., M.S. Interventional Cardiologist  [facility]  Pager # [phone] Phone # [phone] [address] [place], [place] [address]  [facility]    Thank you for choosing [facility] in [place]!!

## 2021-12-15 ENCOUNTER — Encounter: Payer: Self-pay | Admitting: Cardiology

## 2021-12-15 NOTE — Assessment & Plan Note (Signed)
   Now on lifelong Eliquis for A-fib with a CHA2DS2-VASc score of 5.  (HTN, age 76, female 26, CAD, DM) almost 73 which would then make her score 6.   No longer on Plavix, therefore on Eliquis along.    Okay to hold Eliquis for procedures or surgeries: 48 hours for minor procedures, 32 hours for major procedures.

## 2021-12-15 NOTE — Assessment & Plan Note (Signed)
No breakthrough episodes. Remains on Lopressor 25 mg twice daily with adequate rate control. She would not want to study in the past and is therefore still working on weight loss.  Plan: Continue Lopressor for rate control and Eliquis for DOAC.  No longer on Plavix, doing well.

## 2021-12-15 NOTE — Assessment & Plan Note (Signed)
Remarkable weight loss over the years.  She is hoping that with weight loss can avoid titrating her medications. I congratulated her efforts.  Hopefully this will help her blood sugars and blood pressure and lipids.  For certain, her G9J has improved, but lipids still not clinical and BP going up since she stopped her HCTZ.  Congratulated her efforts at weight loss and recommend continuing to push.  Follow-up with PCP in the fall.

## 2021-12-15 NOTE — Assessment & Plan Note (Signed)
RCA PCI back in December 2022.-No further chest pain or pressure.  Completed 6 months of Plavix now on Eliquis alone.  On beta-blocker and ARB. On atorvastatin 10 mg which is the most she can handle.->  Lipids not quite at goal. BP up after stopping HCTZ.  I have asked that she monitor her pressures at home, if they continue to increase potentially with her PCP she should have her losartan dose increased to 100 mg.

## 2021-12-15 NOTE — Assessment & Plan Note (Signed)
A1c excellent at 5.4.  Appropriate on ARB-can decrease dose to 100 mg based on increased blood pressure, will wait until follow-up with PCP.  Unfortunately, her LDL is not at goal-at 84.  She very much wants to avoid titrating up medications. She is hoping that with weight loss she can do better.  Suspected we may need to convert to rosuvastatin from atorvastatin, and may be more aggressive. => She will be due for follow-up labs in roughly November timeframe and at goal, would strongly consider converting to rosuvastatin 20 mg daily.

## 2021-12-15 NOTE — Assessment & Plan Note (Signed)
BBC is trending up-likely related to having stopped HCTZ.  Due for follow-up with PCP-if still elevated would then probably increase ARB to 100 mg.  I also recommend that maybe she takes her furosemide a couple times a week to help with swelling and this will also blood pressure.  She does not Nestl want it every day, but 2 to 3 days a week will be reasonable.

## 2021-12-18 ENCOUNTER — Other Ambulatory Visit: Payer: Self-pay | Admitting: Internal Medicine

## 2022-01-18 ENCOUNTER — Encounter: Payer: Self-pay | Admitting: Internal Medicine

## 2022-01-18 ENCOUNTER — Other Ambulatory Visit: Payer: Self-pay

## 2022-01-18 DIAGNOSIS — I480 Paroxysmal atrial fibrillation: Secondary | ICD-10-CM

## 2022-01-18 DIAGNOSIS — Z780 Asymptomatic menopausal state: Secondary | ICD-10-CM

## 2022-01-18 MED ORDER — APIXABAN 5 MG PO TABS: 5.0000 mg | ORAL_TABLET | Freq: Two times a day (BID) | ORAL | 1 refills | Status: DC

## 2022-01-18 NOTE — Telephone Encounter (Signed)
Prescription refill request for Eliquis received. Indication:Afib  Last office visit: 12/13/21 Ellyn Hack)  Scr: 0.72 (10/16/21)  Age: 76 Weight: 87kg  Appropriate dose and refill sent to requested pharmacy.

## 2022-01-24 ENCOUNTER — Other Ambulatory Visit: Payer: Self-pay

## 2022-01-24 MED ORDER — METOPROLOL TARTRATE 25 MG PO TABS: 25.0000 mg | ORAL_TABLET | Freq: Two times a day (BID) | ORAL | 1 refills | Status: DC

## 2022-02-11 ENCOUNTER — Ambulatory Visit (INDEPENDENT_AMBULATORY_CARE_PROVIDER_SITE_OTHER): Payer: PPO

## 2022-02-11 ENCOUNTER — Encounter: Payer: Self-pay | Admitting: Internal Medicine

## 2022-02-11 ENCOUNTER — Ambulatory Visit (INDEPENDENT_AMBULATORY_CARE_PROVIDER_SITE_OTHER): Payer: PPO | Admitting: Internal Medicine

## 2022-02-11 VITALS — BP 166/88 | HR 56 | Temp 98.2°F | Ht 64.0 in | Wt 189.6 lb

## 2022-02-11 DIAGNOSIS — E1169 Type 2 diabetes mellitus with other specified complication: Secondary | ICD-10-CM

## 2022-02-11 DIAGNOSIS — M7989 Other specified soft tissue disorders: Secondary | ICD-10-CM

## 2022-02-11 DIAGNOSIS — R1319 Other dysphagia: Secondary | ICD-10-CM | POA: Diagnosis not present

## 2022-02-11 DIAGNOSIS — I10 Essential (primary) hypertension: Secondary | ICD-10-CM

## 2022-02-11 DIAGNOSIS — M25711 Osteophyte, right shoulder: Secondary | ICD-10-CM | POA: Diagnosis not present

## 2022-02-11 DIAGNOSIS — M19011 Primary osteoarthritis, right shoulder: Secondary | ICD-10-CM | POA: Diagnosis not present

## 2022-02-11 DIAGNOSIS — Z9861 Coronary angioplasty status: Secondary | ICD-10-CM | POA: Diagnosis not present

## 2022-02-11 DIAGNOSIS — Z23 Encounter for immunization: Secondary | ICD-10-CM | POA: Diagnosis not present

## 2022-02-11 DIAGNOSIS — Z7901 Long term (current) use of anticoagulants: Secondary | ICD-10-CM

## 2022-02-11 DIAGNOSIS — E785 Hyperlipidemia, unspecified: Secondary | ICD-10-CM | POA: Diagnosis not present

## 2022-02-11 DIAGNOSIS — R2231 Localized swelling, mass and lump, right upper limb: Secondary | ICD-10-CM | POA: Diagnosis not present

## 2022-02-11 DIAGNOSIS — E1140 Type 2 diabetes mellitus with diabetic neuropathy, unspecified: Secondary | ICD-10-CM | POA: Diagnosis not present

## 2022-02-11 DIAGNOSIS — I2510 Atherosclerotic heart disease of native coronary artery without angina pectoris: Secondary | ICD-10-CM

## 2022-02-11 LAB — LIPID PANEL
Cholesterol: 155 mg/dL (ref 0–200)
HDL: 44.1 mg/dL (ref >39.00)
LDL Cholesterol: 79 mg/dL (ref 0–99)
NonHDL: 111.38
Total CHOL/HDL Ratio: 4
Triglycerides: 160 mg/dL — ABNORMAL HIGH (ref 0.0–149.0)
VLDL: 32 mg/dL (ref 0.0–40.0)

## 2022-02-11 LAB — COMPREHENSIVE METABOLIC PANEL
ALT: 31 U/L (ref 0–35)
AST: 32 U/L (ref 0–37)
Albumin: 3.8 g/dL (ref 3.5–5.2)
Alkaline Phosphatase: 82 U/L (ref 39–117)
BUN: 18 mg/dL (ref 6–23)
CO2: 26 mEq/L (ref 19–32)
Calcium: 9.2 mg/dL (ref 8.4–10.5)
Chloride: 101 mEq/L (ref 96–112)
Creatinine, Ser: 0.78 mg/dL (ref 0.40–1.20)
GFR: 74.81 mL/min (ref >60.00)
Glucose, Bld: 109 mg/dL — ABNORMAL HIGH (ref 70–99)
Potassium: 3.7 mEq/L (ref 3.5–5.1)
Sodium: 136 mEq/L (ref 135–145)
Total Bilirubin: 0.7 mg/dL (ref 0.2–1.2)
Total Protein: 6.9 g/dL (ref 6.0–8.3)

## 2022-02-11 LAB — CBC WITH DIFFERENTIAL/PLATELET
Basophils Absolute: 0.1 10*3/uL (ref 0.0–0.1)
Basophils Relative: 1 % (ref 0.0–3.0)
Eosinophils Absolute: 0.3 10*3/uL (ref 0.0–0.7)
Eosinophils Relative: 4.6 % (ref 0.0–5.0)
HCT: 35.4 % — ABNORMAL LOW (ref 36.0–46.0)
Hemoglobin: 12.5 g/dL (ref 12.0–15.0)
Lymphocytes Relative: 24 % (ref 12.0–46.0)
Lymphs Abs: 1.6 10*3/uL (ref 0.7–4.0)
MCHC: 35.2 g/dL (ref 30.0–36.0)
MCV: 90.6 fl (ref 78.0–100.0)
Monocytes Absolute: 0.6 10*3/uL (ref 0.1–1.0)
Monocytes Relative: 9 % (ref 3.0–12.0)
Neutro Abs: 4.2 10*3/uL (ref 1.4–7.7)
Neutrophils Relative %: 61.4 % (ref 43.0–77.0)
Platelets: 184 10*3/uL (ref 150.0–400.0)
RBC: 3.91 Mil/uL (ref 3.87–5.11)
RDW: 15.2 % (ref 11.5–15.5)
WBC: 6.8 10*3/uL (ref 4.0–10.5)

## 2022-02-11 LAB — MICROALBUMIN / CREATININE URINE RATIO
Creatinine,U: 54.3 mg/dL
Microalb Creat Ratio: 1.3 mg/g (ref 0.0–30.0)
Microalb, Ur: 0.7 mg/dL (ref 0.0–1.9)

## 2022-02-11 LAB — HEMOGLOBIN A1C: Hgb A1c MFr Bld: 5.4 % (ref 4.6–6.5)

## 2022-02-11 MED ORDER — PANTOPRAZOLE SODIUM 40 MG PO TBEC: 40.0000 mg | DELAYED_RELEASE_TABLET | Freq: Every day | ORAL | 3 refills | Status: DC

## 2022-02-11 MED ORDER — LOSARTAN POTASSIUM 100 MG PO TABS: 100.0000 mg | ORAL_TABLET | Freq: Every day | ORAL | 1 refills | Status: DC

## 2022-02-11 MED ORDER — ZOSTER VAC RECOMB ADJUVANTED 50 MCG/0.5ML IM SUSR: 0.5000 mL | Freq: Once | INTRAMUSCULAR | 0 refills | Status: AC

## 2022-02-11 MED ORDER — TETANUS-DIPHTH-ACELL PERTUSSIS 5-2.5-18.5 LF-MCG/0.5 IM SUSP: 0.5000 mL | Freq: Once | INTRAMUSCULAR | 0 refills | Status: AC

## 2022-02-11 MED ORDER — RSVPREF3 VAC RECOMB ADJUVANTED 120 MCG/0.5ML IM SUSR: 0.5000 mL | Freq: Once | INTRAMUSCULAR | 0 refills | Status: AC

## 2022-02-11 NOTE — Patient Instructions (Addendum)
Your blood pressure is too high.  Goal is 130/80 or less.  Please increase losartan dose to 100 mg daily   I do recommend putting 2 weeks between the flu, COVID and shingles vaccines.  I  also  recommend the RSV vaccine for you, .  It is now available at  [facility], [facility] and [facility] . [facility]   Your referral is in process as requested for the swallow evaluation   Our referral coordinator will call you when the appointment has been made.  If you do not hear from our office in a week,   Please call us back.  Please resume  taking pantoprazole once daily in the morning.  This is an acid reducer

## 2022-02-11 NOTE — Progress Notes (Signed)
Subjective:  Patient ID: Kari [name], female    DOB: [birth year]  Age: 76 y.o. MRN: [id number]  CC: The primary encounter diagnosis was Essential hypertension. Diagnoses of Hyperlipidemia associated with type 2 diabetes mellitus ([place]), Type 2 diabetes mellitus with diabetic neuropathy, without long-term current use of insulin ([place]), CAD S/P percutaneous coronary angioplasty, Esophageal dysphagia, Palpable mass of soft tissue of shoulder, Encounter for current long-term use of anticoagulants, and Need for immunization against influenza were also pertinent to this visit.   HPI Kari [name] presents for follow up on multiple issues  Chief Complaint  Patient presents with   Acute Visit    Lump on right shoulder, difficulty swallowing.    1) lump on top of right shoulder: present for the past month or so. . Not painful excpept with repeated direct pressure.  No history of trauma   2)  dysphagia   has  been taking protonix (prescribed last December by cardiology ).  Did not have the DG esophagus also ordered in December, states she was never called    3) Hypertension: taking losartan and metoprolol.  Home readings on a brand new machine have been 143/ 73,  140./70 and 134/67 ,all done in  the last several days    4) CAD:  s/p PTCA of RCA by Ellyn Hack in Dec 2022.  Was on eliquis and plavix for 6 months, now on eliquis and less bruising noted  5) heme positive stool:  seen by Carlean Purl  attributed to anticoagulation   Did not discuss the the dysphagia with him  5) S/P BLEPHAROPLASTY IN jUNE by Sabino [facility] at [facility]  Outpatient Medications Prior to Visit  Medication Sig Dispense Refill   albuterol (VENTOLIN HFA) 108 (90 Base) MCG/ACT inhaler Inhale 2 puffs by mouth into the lungs every 6 hours as needed for wheezing or shortness of breath 8.5 g 0   allopurinol (ZYLOPRIM) 300 MG tablet Take 1 tablet (300 mg total) by mouth daily. 90 tablet 3   apixaban (ELIQUIS) 5 MG TABS tablet Take 1 tablet (5  mg total) by mouth 2 (two) times daily. 180 tablet 1   atorvastatin (LIPITOR) 10 MG tablet Take 1 tablet (10 mg total) by mouth daily. 90 tablet 3   betamethasone dipropionate 0.05 % cream Apply topically 2 (two) times daily. 30 g 0   calcium carbonate (OS-CAL) 600 MG tablet Take 600 mg by mouth daily.     cetirizine (ZYRTEC) 10 MG tablet Take 10 mg by mouth daily.     Cinnamon 500 MG TABS Take 500 mg by mouth daily.     colchicine 0.6 MG tablet Take 1 tablet (0.6 mg total) by mouth 2 (two) times daily. As needed for gout flare. 60 tablet 2   fluticasone-salmeterol (ADVAIR DISKUS) 250-50 MCG/ACT AEPB TAKE 1 PUFF BY MOUTH TWICE A DAY 180 each 3   furosemide (LASIX) 20 MG tablet Take 1 tablet by mouth every other day 45 tablet 2   Lutein 20 MG TABS Take 20 mg by mouth daily.     Magnesium 250 MG TABS Take 250 mg by mouth daily.     metoprolol tartrate (LOPRESSOR) 25 MG tablet Take 1 tablet (25 mg total) by mouth 2 (two) times daily. 180 tablet 1   mirtazapine (REMERON) 45 MG tablet Take 1 tablet by mouth at bedtime 90 tablet 0   montelukast (SINGULAIR) 10 MG tablet Take 1 tablet (10 mg total) by mouth at bedtime. 90 tablet 3   Multiple Vitamins-Minerals (  CENTRUM MINIS WOMEN 50+) TABS Take 1 tablet by mouth daily.     Omega-3 Fatty Acids (FISH OIL) 1000 MG CAPS Take 1,000 mg by mouth daily.     OVER THE COUNTER MEDICATION Place 2 sprays into both nostrils at bedtime. Nasocort nasal spray     Probiotic Product (PROBIOTIC PO) Take 1 tablet by mouth daily.     traZODone (DESYREL) 50 MG tablet Take 0.5-1 tablets (25-50 mg total) by mouth at bedtime as needed for sleep. 90 tablet 1   triamcinolone lotion (KENALOG) 0.1 % Apply 1 application topically 2 (two) times daily as needed.     losartan (COZAAR) 50 MG tablet Take 1 tablet (50 mg total) by mouth at bedtime. 90 tablet 3   pantoprazole (PROTONIX) 40 MG tablet Take 1 tablet (40 mg total) by mouth daily. 90 tablet 3   benzonatate (TESSALON) 200 MG  capsule Take 1 capsule (200 mg total) by mouth 3 (three) times daily as needed for cough. (Patient not taking: Reported on 02/11/2022) 60 capsule 1   No facility-administered medications prior to visit.    Review of Systems;  Patient denies headache, fevers, malaise, unintentional weight loss, skin rash, eye pain, sinus congestion and sinus pain, sore throat, dysphagia,  hemoptysis , cough, dyspnea, wheezing, chest pain, palpitations, orthopnea, edema, abdominal pain, nausea, melena, diarrhea, constipation, flank pain, dysuria, hematuria, urinary  Frequency, nocturia, numbness, tingling, seizures,  Focal weakness, Loss of consciousness,  Tremor, insomnia, depression, anxiety, and suicidal ideation.      Objective:  BP (!) 166/88 (BP Location: Left Arm, Patient Position: Sitting, Cuff Size: Normal)   Pulse (!) 56   Temp 98.2 F (36.8 C) (Oral)   Ht '5\' 4"'$  (1.626 m)   Wt 189 lb 9.6 oz (86 kg)   SpO2 97%   BMI 32.54 kg/m   BP Readings from Last 3 Encounters:  02/11/22 (!) 166/88  12/13/21 (!) 148/69  11/22/21 (!) 142/68    Wt Readings from Last 3 Encounters:  02/11/22 189 lb 9.6 oz (86 kg)  12/13/21 191 lb 12.8 oz (87 kg)  11/22/21 189 lb (85.7 kg)    General appearance: alert, cooperative and appears stated age Ears: normal TM's and external ear canals both ears Throat: lips, mucosa, and tongue normal; teeth and gums normal Neck: no adenopathy, no carotid bruit, supple, symmetrical, trachea midline and thyroid not enlarged, symmetric, no tenderness/mass/nodules Back: symmetric, no curvature. ROM normal. No CVA tenderness. Lungs: clear to auscultation bilaterally Heart: regular rate and rhythm, S1, S2 normal, no murmur, click, rub or gallop Abdomen: soft, non-tender; bowel sounds normal; no masses,  no organomegaly Pulses: 2+ and symmetric Skin: Skin color, texture, turgor normal. No rashes or lesions Lymph nodes: Cervical, supraclavicular, and axillary nodes normal. Neuro:   awake and interactive with normal mood and affect. Higher cortical functions are normal. Speech is clear without word-finding difficulty or dysarthria. Extraocular movements are intact. Visual fields of both eyes are grossly intact. Sensation to light touch is grossly intact bilaterally of upper and lower extremities. Motor examination shows 4+/5 symmetric hand grip and upper extremity and 5/5 lower extremity strength. There is no pronation or drift. Gait is non-ataxic   Lab Results  Component Value Date   HGBA1C 5.4 02/11/2022   HGBA1C 5.4 09/19/2021   HGBA1C 5.8 04/16/2021    Lab Results  Component Value Date   CREATININE 0.78 02/11/2022   CREATININE 0.72 10/16/2021   CREATININE 0.74 09/19/2021    Lab Results  Component  Value Date   WBC 6.8 02/11/2022   HGB 12.5 02/11/2022   HCT 35.4 (L) 02/11/2022   PLT 184.0 02/11/2022   GLUCOSE 109 (H) 02/11/2022   CHOL 155 02/11/2022   TRIG 160.0 (H) 02/11/2022   HDL 44.10 02/11/2022   LDLDIRECT 88.0 09/19/2021   LDLCALC 79 02/11/2022   ALT 31 02/11/2022   AST 32 02/11/2022   NA 136 02/11/2022   K 3.7 02/11/2022   CL 101 02/11/2022   CREATININE 0.78 02/11/2022   BUN 18 02/11/2022   CO2 26 02/11/2022   TSH 1.72 09/19/2021   HGBA1C 5.4 02/11/2022   MICROALBUR 0.7 02/11/2022    CARDIAC CATHETERIZATION  Result Date: 04/30/2021   Prox RCA lesion is 99% stenosed.   A drug-eluting stent was successfully placed using a STENT ONYX FRONTIER [id number].   Post intervention, there is a 0% residual stenosis.   The left ventricular systolic function is normal.   LV end diastolic pressure is mildly elevated.   The left ventricular ejection fraction is 55-65% by visual estimate. 1.  Severe one-vessel coronary artery disease with 99% stenosis in proximal right coronary artery which is a medium size vessel.  No obstructive disease affecting the LAD and left circumflex. 2.  Normal LV systolic function and mildly to moderately elevated left ventricular  end-diastolic pressure at 22 mmHg likely due to diastolic dysfunction. 3.  Successful angioplasty and drug-eluting stent placement to the proximal right coronary artery. Recommendations: Resume Eliquis tomorrow if no bleeding issues. Once Eliquis is resumed, will discontinue aspirin and keep her on Plavix which should be continued for 6 to 12 months. Will check lipid and liver profile tomorrow and consider increasing the dose of atorvastatin.    Assessment & Plan:   Problem List Items Addressed This Visit     Diabetes mellitus with neuropathy ([place])    She remains   well-controlled on diet alone . Marland Kitchen Patient is up-to-date on eye exams and foot exam is normal despite reports of  mild numbness without skin changes  today. Patient has no microalbuminuria. Patient is tolerating statin therapy for CAD risk reduction and on ACE/ARB for renal protection and hypertension .  Will recommend addition of Jardiance or Wilder Glade  given new diagnosis of CAD   Lab Results  Component Value Date   HGBA1C 5.4 02/11/2022   Lab Results  Component Value Date   MICROALBUR 0.7 02/11/2022   MICROALBUR <0.7 04/16/2021          Relevant Medications   losartan (COZAAR) 100 MG tablet   empagliflozin (JARDIANCE) 10 MG TABS tablet   Other Relevant Orders   Hemoglobin A1c (Completed)   Comprehensive metabolic panel (Completed)   Microalbumin / creatinine urine ratio (Completed)   Dysphagia    Progressive ,  Present for several months.   DG esophagus ordered after last visit but never scheduled,  And unfortunately she did not discuss symptoms with Dr Carlean Purl in July during there evaluation for heme positive stools.  Repeat order made and referral coordinator contracted by office to ensure timely processing of order       Relevant Orders   DG ESOPHAGUS W SINGLE CM (SOL OR THIN BA)   Palpable mass of soft tissue of shoulder    Etiology unclear  Plain film ordered to evaluate for bony involvement       Relevant  Orders   DG Shoulder Right   Hyperlipidemia associated with type 2 diabetes mellitus ([place]) (Chronic)   Relevant Medications  losartan (COZAAR) 100 MG tablet   empagliflozin (JARDIANCE) 10 MG TABS tablet   Other Relevant Orders   LDL cholesterol, direct   Lipid panel (Completed)   Essential hypertension - Primary (Chronic)   Relevant Medications   losartan (COZAAR) 100 MG tablet   Other Relevant Orders   Comprehensive metabolic panel (Completed)   Microalbumin / creatinine urine ratio (Completed)   CAD S/P percutaneous coronary angioplasty (Chronic)    S/p PTCA and placement of DES in RCA.  Found during workup for PAF with RVR .  Currently on eliquis only,  Has follow up every 6 months       Relevant Medications   losartan (COZAAR) 100 MG tablet   Other Visit Diagnoses     Encounter for current long-term use of anticoagulants       Relevant Orders   CBC with Differential/Platelet (Completed)   Need for immunization against influenza       Relevant Orders   Flu Vaccine QUAD High Dose(Fluad) (Completed)       I spent a total of  40 minutes with this patient in a face to face visit on the date of this encounter reviewing her recent hospitalization and cardiac catheterization ,her ,  most recent visit with cardiology ,  patient's diet and exercise habits, home blood pressure readings, labs and imaging studies ,   and post visit ordering of testing and therapeutics.    Follow-up: No follow-ups on file.   [name], MD

## 2022-02-11 NOTE — Assessment & Plan Note (Addendum)
S/p PTCA and placement of DES in RCA.  Found during workup for PAF with RVR .  Currently on eliquis only,  Has follow up every 6 months

## 2022-02-12 DIAGNOSIS — M7989 Other specified soft tissue disorders: Secondary | ICD-10-CM | POA: Insufficient documentation

## 2022-02-12 DIAGNOSIS — R1310 Dysphagia, unspecified: Secondary | ICD-10-CM | POA: Insufficient documentation

## 2022-02-12 MED ORDER — EMPAGLIFLOZIN 10 MG PO TABS: 10.0000 mg | ORAL_TABLET | Freq: Every day | ORAL | 2 refills | Status: DC

## 2022-02-12 NOTE — Assessment & Plan Note (Signed)
Progressive ,  Present for several months.   DG esophagus ordered after last visit but never scheduled,  And unfortunately she did not discuss symptoms with Dr Carlean Purl in July during there evaluation for heme positive stools.  Repeat order made and referral coordinator contracted by office to ensure timely processing of order

## 2022-02-12 NOTE — Assessment & Plan Note (Addendum)
Etiology unclear  Plain film ordered to evaluate for bony involvement

## 2022-02-12 NOTE — Assessment & Plan Note (Signed)
She remains   well-controlled on diet alone . Marland Kitchen Patient is up-to-date on eye exams and foot exam is normal despite reports of  mild numbness without skin changes  today. Patient has no microalbuminuria. Patient is tolerating statin therapy for CAD risk reduction and on ACE/ARB for renal protection and hypertension .  Will recommend addition of Jardiance or Wilder Glade  given new diagnosis of CAD   Lab Results  Component Value Date   HGBA1C 5.4 02/11/2022   Lab Results  Component Value Date   MICROALBUR 0.7 02/11/2022   MICROALBUR <0.7 04/16/2021

## 2022-02-14 ENCOUNTER — Encounter: Payer: Self-pay | Admitting: Internal Medicine

## 2022-02-14 DIAGNOSIS — D6859 Other primary thrombophilia: Secondary | ICD-10-CM

## 2022-02-17 NOTE — Telephone Encounter (Signed)
Can pou place a [place] consult?  See mychart message

## 2022-02-19 NOTE — Telephone Encounter (Signed)
Referral has been submitted

## 2022-02-26 DIAGNOSIS — H02831 Dermatochalasis of right upper eyelid: Secondary | ICD-10-CM | POA: Diagnosis not present

## 2022-02-26 DIAGNOSIS — H04123 Dry eye syndrome of bilateral lacrimal glands: Secondary | ICD-10-CM | POA: Diagnosis not present

## 2022-02-26 DIAGNOSIS — H35372 Puckering of macula, left eye: Secondary | ICD-10-CM | POA: Diagnosis not present

## 2022-02-26 DIAGNOSIS — H524 Presbyopia: Secondary | ICD-10-CM | POA: Diagnosis not present

## 2022-02-26 DIAGNOSIS — H02834 Dermatochalasis of left upper eyelid: Secondary | ICD-10-CM | POA: Diagnosis not present

## 2022-02-26 DIAGNOSIS — H5203 Hypermetropia, bilateral: Secondary | ICD-10-CM | POA: Diagnosis not present

## 2022-02-26 DIAGNOSIS — H52223 Regular astigmatism, bilateral: Secondary | ICD-10-CM | POA: Diagnosis not present

## 2022-02-27 ENCOUNTER — Telehealth: Payer: Self-pay

## 2022-02-27 NOTE — Progress Notes (Signed)
[facility]) Care Management  [place]   02/27/2022  Azure Budnick [birth year] [id number]  Reason for referral: Medication Assistance   Referral source: [place] Management SW Referral medication(s): Eliquis and [place]  [place] team received a referral for Eliquis and Jardiance last week. Our team was finally able to reach her today. Ms. Surges reports paying $140 for Eliquis (90-days supply) d/t being in the Medicare Part D coverage gap and expects to the same for Jardiance - which she cannot afford. When assessing for Advair medication assistance needs, patient reports that she no longer needs to take medication.   Patient's reported income exceeds the threshold for both Eliquis and Jardiance (as well as other DOACs and SGLT2 inhibitors) medication assistance programs. However, patient may qualify for [employer] Diabetic grant for a stipend of $3,100/year and was explained the process. Xarelto cost-savings program is $240 for 90 days supply or $85 for 30 days supply. This was not mentioned to patient as that option is more expensive. Patient is aware she does not qualify for PAP for Eliquis.  Medication Assistance Findings:  Medication assistance needs identified: Jardiance   Plan: I enrolled patient on the [employer] waitlist for diabetes. Will update PCP regarding grant opportunity.   Thank you for allowing pharmacy to be a part of this patient's care.   Kristeen Miss, PharmD Clinical Pharmacist [place] Cell: [phone]

## 2022-02-28 ENCOUNTER — Ambulatory Visit
Admission: RE | Admit: 2022-02-28 | Discharge: 2022-02-28 | Disposition: A | Discharge status: Home / Self Care | Payer: PPO | Source: Ambulatory Visit | Attending: Internal Medicine | Admitting: Internal Medicine

## 2022-02-28 DIAGNOSIS — Z1231 Encounter for screening mammogram for malignant neoplasm of breast: Secondary | ICD-10-CM | POA: Insufficient documentation

## 2022-02-28 DIAGNOSIS — Z780 Asymptomatic menopausal state: Secondary | ICD-10-CM | POA: Diagnosis not present

## 2022-02-28 DIAGNOSIS — M85851 Other specified disorders of bone density and structure, right thigh: Secondary | ICD-10-CM | POA: Diagnosis not present

## 2022-03-05 ENCOUNTER — Other Ambulatory Visit: Payer: Self-pay

## 2022-03-05 MED ORDER — MIRTAZAPINE 45 MG PO TABS: 45.0000 mg | ORAL_TABLET | Freq: Every day | ORAL | 1 refills | Status: DC

## 2022-03-11 ENCOUNTER — Encounter (INDEPENDENT_AMBULATORY_CARE_PROVIDER_SITE_OTHER): Payer: Self-pay

## 2022-04-18 ENCOUNTER — Encounter: Payer: Self-pay | Admitting: Internal Medicine

## 2022-04-18 ENCOUNTER — Ambulatory Visit (INDEPENDENT_AMBULATORY_CARE_PROVIDER_SITE_OTHER): Payer: PPO | Admitting: Internal Medicine

## 2022-04-18 VITALS — BP 126/72 | HR 55 | Ht 64.0 in | Wt 190.0 lb

## 2022-04-18 DIAGNOSIS — R3915 Urgency of urination: Secondary | ICD-10-CM

## 2022-04-18 DIAGNOSIS — R1310 Dysphagia, unspecified: Secondary | ICD-10-CM

## 2022-04-18 DIAGNOSIS — R152 Fecal urgency: Secondary | ICD-10-CM

## 2022-04-18 NOTE — Patient Instructions (Signed)
You have been scheduled for a Barium Esophogram at [facility] on 04/29/2022 at 10:00AM. Please arrive 30 minutes prior to your appointment for registration. Make certain not to have anything to eat or drink 3 hours prior to your test. If you need to reschedule for any reason, please contact radiology at [phone] to do so. __________________________________________________________________ A barium swallow is an examination that concentrates on views of the esophagus. This tends to be a double contrast exam (barium and two liquids which, when combined, create a gas to distend the wall of the oesophagus) or single contrast (non-ionic iodine based). The study is usually tailored to your symptoms so a good history is essential. Attention is paid during the study to the form, structure and configuration of the esophagus, looking for functional disorders (such as aspiration, dysphagia, achalasia, motility and reflux) EXAMINATION You may be asked to change into a gown, depending on the type of swallow being performed. A radiologist and radiographer will perform the procedure. The radiologist will advise you of the type of contrast selected for your procedure and direct you during the exam. You will be asked to stand, sit or lie in several different positions and to hold a small amount of fluid in your mouth before being asked to swallow while the imaging is performed .In some instances you may be asked to swallow barium coated marshmallows to assess the motility of a solid food bolus. The exam can be recorded as a digital or video fluoroscopy procedure. POST PROCEDURE It will take 1-2 days for the barium to pass through your system. To facilitate this, it is important, unless otherwise directed, to increase your fluids for the next 24-48hrs and to resume your normal diet.  This test typically takes about 30 minutes to  perform. __________________________________________________________________________________  Dennis Bast will be contacted about setting up an appointment for pelvic floor P.T.   I appreciate the opportunity to care for you. Silvano Rusk, MD

## 2022-04-18 NOTE — Progress Notes (Signed)
Kari [name] 76 y.o. 06-04-1945 [id number]  Assessment & Plan:   Encounter Diagnoses  Name Primary?   Dysphagia, unspecified type Yes   Fecal urgency    Urinary urgency    Evaluate dysphagia with barium swallow.  Further plans pending that.  Fecal and urinary symptoms-pelvic floor physical therapy referral.    CC: Kari Mc, MD      Subjective:   Chief Complaint: Dysphagia and fecal urgency  HPI 76 year old white woman with a history of colon polyps and family history of colon cancer with negative colonoscopy 2022, also history of A-fib and prior PCI had been on clopidogrel and Eliquis. Clopidogrel was discontinued in late May.  Was seen about an iFOBT positive stool in July and we did not workup further.  She had been having dysphagia but did not realize I was the proper physician to consult about that.  She is describing a 2-year history of intermittent dysphagia.  She describes choking and coughing and some suprasternal sticking point especially on the right.  She has difficulty with liquids and some pills that are large.  She does not seem to have signs or symptoms of aspiration that I can tell.  She says Posta and other foods will clump up and be sensed in the right pharynx.  She has to sit upright and eat slowly to minimize this.  During the interview she is clearing her throat several times.  She has been on pantoprazole for 1 year and these symptoms persist.  She is also complaining of intermittent fecal urgency with some near incontinence or small incontinence at least once, and has urinary urgency as well. Allergies  Allergen Reactions   Hydrochlorothiazide Other (See Comments)    Severe hyponatremia    Nifedipine     REACTION: swollen feet   Current Meds  Medication Sig   allopurinol (ZYLOPRIM) 300 MG tablet Take 1 tablet (300 mg total) by mouth daily.   apixaban (ELIQUIS) 5 MG TABS tablet Take 1 tablet (5 mg total) by mouth 2 (two) times daily.    atorvastatin (LIPITOR) 10 MG tablet Take 1 tablet (10 mg total) by mouth daily.   betamethasone dipropionate 0.05 % cream Apply topically 2 (two) times daily.   calcium carbonate (OS-CAL) 600 MG tablet Take 600 mg by mouth daily.   cetirizine (ZYRTEC) 10 MG tablet Take 10 mg by mouth daily.   Cinnamon 500 MG TABS Take 500 mg by mouth daily.   colchicine 0.6 MG tablet Take 1 tablet (0.6 mg total) by mouth 2 (two) times daily. As needed for gout flare.   fluticasone-salmeterol (ADVAIR DISKUS) 250-50 MCG/ACT AEPB TAKE 1 PUFF BY MOUTH TWICE A DAY   losartan (COZAAR) 100 MG tablet Take 1 tablet (100 mg total) by mouth at bedtime.   Lutein 20 MG TABS Take 20 mg by mouth daily.   Magnesium 250 MG TABS Take 250 mg by mouth daily.   metoprolol tartrate (LOPRESSOR) 25 MG tablet Take 1 tablet (25 mg total) by mouth 2 (two) times daily.   mirtazapine (REMERON) 45 MG tablet Take 1 tablet (45 mg total) by mouth at bedtime.   montelukast (SINGULAIR) 10 MG tablet Take 1 tablet (10 mg total) by mouth at bedtime.   Multiple Vitamins-Minerals (CENTRUM MINIS WOMEN 50+) TABS Take 1 tablet by mouth daily.   Omega-3 Fatty Acids (FISH OIL) 1000 MG CAPS Take 1,000 mg by mouth daily.   OVER THE COUNTER MEDICATION Place 2 sprays into both nostrils at bedtime.  Nasocort nasal spray   pantoprazole (PROTONIX) 40 MG tablet Take 1 tablet (40 mg total) by mouth daily.   Probiotic Product (PROBIOTIC PO) Take 1 tablet by mouth daily.   traZODone (DESYREL) 50 MG tablet Take 0.5-1 tablets (25-50 mg total) by mouth at bedtime as needed for sleep.   triamcinolone lotion (KENALOG) 0.1 % Apply 1 application topically 2 (two) times daily as needed.   Past Medical History:  Diagnosis Date   Allergy    to cat, dogs, pollen, dust, cigarette smoke   Arthritis    feet   Cataract    removed both eyes   COPD (chronic obstructive pulmonary disease) with chronic bronchitis    And moderate persistent asthma; complicated by Restrictive  Lung Disease from obesity   Coronary artery disease involving native coronary artery of native heart with other form of angina pectoris ([place])    a. 04/2021 Cor CTA: Cor Ca2+ 389. Domninant RCA-RPDA<PLA. RCA >70p (abnl CTFFR), LAD 50-69p, LCX Min dz; b. 04/2021 Cath/PCI: LM nl, LAD mild diff dzs, D1/2/3 nl, LCX min irregs, OM1/2/3 nl, RCA 99p (2.75x30 Onyx Frontier DES).   Depression    Diastolic dysfunction    a. 04/2021 Echo: EF 60-65%, GrI DD. Nl RV fxn. Mildly dil LA.   GERD (gastroesophageal reflux disease)    Hx of adenomatous colonic polyps 05/30/2010   Hyperlipidemia    Hypertension    Lymphedema of both lower extremities    No superficial venous reflux noted.  However right-sided saphenofemoral junction reflux noted and left common femoral vein reflux noted.  No DVT or superficial thrombus.   Menopause    Migraines    Miscarriage    X 1   Moderate persistent asthma    Obesity (BMI 30.0-34.9) 03/04/2013   PONV (postoperative nausea and vomiting)    Past Surgical History:  Procedure Laterality Date   CATARACT EXTRACTION, BILATERAL     CESAREAN SECTION     X 2   COLONOSCOPY     CORONARY STENT INTERVENTION N/A 04/30/2021   Procedure: CORONARY STENT INTERVENTION;  Surgeon: Wellington Hampshire, MD;  Location: [place] [facility];  Service: Cardiovascular;  Proximal RCA 99% => DES PCI with Onyx Frontier 2.75 mm x 30 mm   DENTAL SURGERY  09/2016   EYE SURGERY     eye lid surgery 11/2021   LEFT HEART CATH AND CORONARY ANGIOGRAPHY Left 04/30/2021   Procedure: LEFT HEART CATH AND CORONARY ANGIOGRAPHY;  Surgeon: Wellington Hampshire, MD;  Location: [place] [facility];  Service: Cardiovascular;; Proximal RCA 99% => DES PCI;  Ony; mild diffuse LAD and minimal diffuse LCx disease.  Moderately elevated EDP (22 mmHg).  EF 55-65 %.   POLYPECTOMY     TRANSTHORACIC ECHOCARDIOGRAM  11/04/2019   a) EF 55 to 60%.  Normal LV size and function.  GR 1 DD.  Unable to assess.  Mild LA dilation.  Normal  aortic and mitral valve.  Normal RVP.  (Normal study);; b) 04/2021: Normal LV size and function.  EF 60 to 65%.  No RWMA.  GR 1 DD.Normal RV size and function.  Normal aortic and mitral valves.   TUBAL LIGATION  1980   Social History   Social History Narrative   Married, works full-time for a [employer]   Former smoker no alcohol tobacco or drug use at this time   family history includes Cancer (age of onset: 63) in her sister; Cervical cancer in her mother; Colon cancer (age of onset:  53) in her sister; Coronary artery disease in her father; Heart Problems in her brother and sister; Heart attack in her paternal grandfather; Heart attack (age of onset: 60) in her father; Heart disease in her father; Myasthenia gravis in her mother; Valvular heart disease in her father.   Review of Systems As per HPI  Objective:   Physical Exam BP 126/72   Pulse (!) 55   Ht '5\' 4"'$  (1.626 m)   Wt 190 lb (86.2 kg)   BMI 32.61 kg/m  NAD elderly white woman, obese Patti Martinique, CMA present. Rectal exam: Anoderm inspection revealed erythema and a small external hemorrhoid LL Digital exam revealed mildly resting tone and voluntary squeeze. No mass or rectocele present. Simulated defecation with valsalva revealed appropriate abdominal contraction and ? Mildly reduced descent.

## 2022-04-29 ENCOUNTER — Ambulatory Visit
Admission: RE | Admit: 2022-04-29 | Discharge: 2022-04-29 | Disposition: A | Discharge status: Home / Self Care | Payer: PPO | Source: Ambulatory Visit | Attending: Internal Medicine | Admitting: Internal Medicine

## 2022-04-29 ENCOUNTER — Other Ambulatory Visit: Payer: Self-pay | Admitting: Internal Medicine

## 2022-04-29 DIAGNOSIS — R3915 Urgency of urination: Secondary | ICD-10-CM

## 2022-04-29 DIAGNOSIS — R1310 Dysphagia, unspecified: Secondary | ICD-10-CM | POA: Diagnosis not present

## 2022-04-29 DIAGNOSIS — K224 Dyskinesia of esophagus: Secondary | ICD-10-CM | POA: Diagnosis not present

## 2022-04-29 DIAGNOSIS — R152 Fecal urgency: Secondary | ICD-10-CM

## 2022-04-30 ENCOUNTER — Encounter: Payer: Self-pay | Admitting: Internal Medicine

## 2022-04-30 DIAGNOSIS — K224 Dyskinesia of esophagus: Secondary | ICD-10-CM | POA: Insufficient documentation

## 2022-04-30 HISTORY — DX: Dyskinesia of esophagus: K22.4

## 2022-07-10 ENCOUNTER — Telehealth: Payer: Self-pay

## 2022-07-10 NOTE — Progress Notes (Signed)
[place] [facility]) [place]   07/10/2022  Lalana Dellarocca Sep 29, 1945 [id number]  Reason for referral: Medication Assistance  Referral source: Dr. [name] Current insurance: [employer]  Reason for call: Discuss closing medication assistance referral for grant enrollment as patient was put on the wait list. This pharmacist has checked multiples to see if grant is open for diabetic medication and it is not.   Outreach:  Unsuccessful telephone call attempt #3 to patient (in total).   HIPAA compliant voicemail left requesting a return call  Plan:  -I will close Farley case at this time as I have been unable to establish and/or maintain contact with patient. -I am happy to assist in the future as needed.  Thank you for allowing [facility] pharmacy to be a part of this patient's care. Kristeen Miss, PharmD Clinical Pharmacist [place] Cell: [phone]

## 2022-07-23 ENCOUNTER — Other Ambulatory Visit: Payer: Self-pay

## 2022-07-23 MED ORDER — LOSARTAN POTASSIUM 100 MG PO TABS: 100.0000 mg | ORAL_TABLET | Freq: Every day | ORAL | 1 refills | Status: DC

## 2022-07-24 ENCOUNTER — Telehealth: Payer: Self-pay

## 2022-07-24 DIAGNOSIS — I480 Paroxysmal atrial fibrillation: Secondary | ICD-10-CM

## 2022-07-24 MED ORDER — APIXABAN 5 MG PO TABS: 5.0000 mg | ORAL_TABLET | Freq: Two times a day (BID) | ORAL | 1 refills | Status: DC

## 2022-07-24 NOTE — Addendum Note (Signed)
Addended by: Leonidas Romberg on: 07/24/2022 10:48 AM   Modules accepted: Orders

## 2022-07-24 NOTE — Telephone Encounter (Signed)
Prescription refill request for Eliquis received. Indication: Afib  Last office visit: 12/13/21 Ellyn Hack)  Scr: 0.78 (02/11/22)  Age: 77 Weight: 86.2kg  Appropriate dose. Refill sent.

## 2022-07-24 NOTE — Telephone Encounter (Signed)
Incoming fax from [employer] by [employer], [place]   They are requesting a 90 day supply of   Eliquis 5 mg Take one tablet ( '5mg'$ ) by mouth twice a day.

## 2022-08-08 ENCOUNTER — Ambulatory Visit
Admission: EM | Admit: 2022-08-08 | Discharge: 2022-08-08 | Disposition: A | Discharge status: Home / Self Care | Payer: PPO | Attending: Emergency Medicine | Admitting: Emergency Medicine

## 2022-08-08 ENCOUNTER — Other Ambulatory Visit: Payer: Self-pay | Admitting: *Deleted

## 2022-08-08 DIAGNOSIS — J441 Chronic obstructive pulmonary disease with (acute) exacerbation: Secondary | ICD-10-CM

## 2022-08-08 DIAGNOSIS — I10 Essential (primary) hypertension: Secondary | ICD-10-CM

## 2022-08-08 MED ORDER — ALBUTEROL SULFATE HFA 108 (90 BASE) MCG/ACT IN AERS: 1.0000 | INHALATION_SPRAY | Freq: Four times a day (QID) | RESPIRATORY_TRACT | 0 refills | Status: DC | PRN

## 2022-08-08 MED ORDER — PREDNISONE 10 MG PO TABS: 40.0000 mg | ORAL_TABLET | Freq: Every day | ORAL | 0 refills | Status: AC

## 2022-08-08 MED ORDER — DOXYCYCLINE HYCLATE 100 MG PO CAPS: 100.0000 mg | ORAL_CAPSULE | Freq: Two times a day (BID) | ORAL | 0 refills | Status: AC

## 2022-08-08 NOTE — ED Provider Notes (Signed)
Kari [name]    CSN: [id number] Arrival date & time: 08/08/22  1343      History   Chief Complaint Chief Complaint  Patient presents with   Cough   Nasal Congestion    HPI Kari [name] is a 77 y.o. [name].  Patient presents with 5 day history of congestion and cough.  No fever, shortness of breath, or other symptoms.  She has COPD and is out of her inhalers (Advair and albuterol); she has not used these inhalers in over a year.   As her symptoms continued, she attempted to use her inhalers but noted they had expired in 2022 and 2023.  She has been treating her symptoms with Delsym.    The history is provided by the patient and medical records.    Past Medical History:  Diagnosis Date   Allergy    to cat, dogs, pollen, dust, cigarette smoke   Arthritis    feet   Cataract    removed both eyes   COPD (chronic obstructive pulmonary disease) with chronic bronchitis    And moderate persistent asthma; complicated by Restrictive Lung Disease from obesity   Coronary artery disease involving native coronary artery of native heart with other form of angina pectoris ([place])    a. 04/2021 Cor CTA: Cor Ca2+ 389. Domninant RCA-RPDA<PLA. RCA >70p (abnl CTFFR), LAD 50-69p, LCX Min dz; b. 04/2021 Cath/PCI: LM nl, LAD mild diff dzs, D1/2/3 nl, LCX min irregs, OM1/2/3 nl, RCA 99p (2.75x30 Onyx Frontier DES).   Depression    Diastolic dysfunction    a. 04/2021 Echo: EF 60-65%, GrI DD. Nl RV fxn. Mildly dil LA.   Esophageal dysmotility 04/30/2022   Barium swallow 04/30/2022-also has impingement of tablet at aortic arch, we know from CT angiography that she has mild aortic enlargement and pulmonary outflow tract enlargement   GERD (gastroesophageal reflux disease)    Hx of adenomatous colonic polyps 05/30/2010   Hyperlipidemia    Hypertension    Lymphedema of both lower extremities    No superficial venous reflux noted.  However right-sided saphenofemoral junction reflux noted and left  common femoral vein reflux noted.  No DVT or superficial thrombus.   Menopause    Migraines    Miscarriage    X 1   Moderate persistent asthma    Obesity (BMI 30.0-34.9) 03/04/2013   PONV (postoperative nausea and vomiting)     Patient Active Problem List   Diagnosis Date Noted   Esophageal dysmotility 04/30/2022   Dysphagia 02/12/2022   Palpable mass of soft tissue of shoulder 02/12/2022   Hyponatremia 09/23/2021   Anemia, unspecified 0000000   Umbilical hernia without obstruction and without gangrene 09/19/2021   Left hand pain 07/16/2021   Chronic right shoulder pain 07/16/2021   Hypercoagulable state due to paroxysmal atrial fibrillation ([place]) 07/02/2021   CAD S/P percutaneous coronary angioplasty 05/01/2021   Effort angina 04/30/2021   Thrombophilia ([place]) 04/16/2021   Hypokalemia 04/12/2021   Paroxysmal atrial fibrillation with rapid ventricular response ([place]); CHA2DS2-VASc score 5 (HTN, DM-2, Age, [name], Aortic Plaque) 04/11/2021   Abnormal cardiac CT angiography 04/11/2021   Lymphedema 10/10/2020   Ankle edema, bilateral 09/09/2020   Insomnia 09/09/2020   Pulmonary nodule 09/22/2019   Osteopenia after menopause 12/12/2017   Diabetes mellitus with neuropathy ([place]) 07/06/2016   Family history of colon cancer - sister 59's 11/23/2014   Gouty arthritis of toe of right foot 09/15/2014   Major depressive disorder, single episode, in remission ([place]) 08/11/2013  Obesity (BMI 30.0-34.9) 03/04/2013   Encounter for Medicare annual wellness exam 08/06/2012   Palpitations 07/25/2011   Menopause    Screening for malignant neoplasm of breast 01/02/2011   Hearing loss 01/02/2011   Hx of adenomatous colonic polyps 05/30/2010   Vitamin D deficiency 08/19/2007   POSTMENOPAUSAL STATUS 08/19/2007   Hyperlipidemia associated with type 2 diabetes mellitus ([place]) 08/18/2007   Essential hypertension 08/18/2007   ALLERGIC RHINITIS 08/18/2007   Asthma 08/18/2007   GERD 08/18/2007    IBS 08/18/2007   PSORIASIS 08/18/2007   Osteoarthritis 08/18/2007   MIGRAINES, HX OF 08/18/2007    Past Surgical History:  Procedure Laterality Date   CATARACT EXTRACTION, BILATERAL     CESAREAN SECTION     X 2   COLONOSCOPY     CORONARY STENT INTERVENTION N/A 04/30/2021   Procedure: CORONARY STENT INTERVENTION;  Surgeon: Wellington Hampshire, MD;  Location: [place] [facility];  Service: Cardiovascular;  Proximal RCA 99% => DES PCI with Onyx Frontier 2.75 mm x 30 mm   DENTAL SURGERY  09/2016   EYE SURGERY     eye lid surgery 11/2021   LEFT HEART CATH AND CORONARY ANGIOGRAPHY Left 04/30/2021   Procedure: LEFT HEART CATH AND CORONARY ANGIOGRAPHY;  Surgeon: Wellington Hampshire, MD;  Location: [place] [facility];  Service: Cardiovascular;; Proximal RCA 99% => DES PCI;  Ony; mild diffuse LAD and minimal diffuse LCx disease.  Moderately elevated EDP (22 mmHg).  EF 55-65 %.   POLYPECTOMY     TRANSTHORACIC ECHOCARDIOGRAM  11/04/2019   a) EF 55 to 60%.  Normal LV size and function.  GR 1 DD.  Unable to assess.  Mild LA dilation.  Normal aortic and mitral valve.  Normal RVP.  (Normal study);; b) 04/2021: Normal LV size and function.  EF 60 to 65%.  No RWMA.  GR 1 DD.Normal RV size and function.  Normal aortic and mitral valves.   TUBAL LIGATION  1980    OB History   No obstetric history on file.      Home Medications    Prior to Admission medications   Medication Sig Start Date End Date Taking? Authorizing Provider  albuterol (VENTOLIN HFA) 108 (90 Base) MCG/ACT inhaler Inhale 1-2 puffs into the lungs every 6 (six) hours as needed. 08/08/22  Yes Sharion Balloon, NP  allopurinol (ZYLOPRIM) 300 MG tablet Take 1 tablet (300 mg total) by mouth daily. 10/12/21   [name], MD  apixaban (ELIQUIS) 5 MG TABS tablet Take 1 tablet (5 mg total) by mouth 2 (two) times daily. 07/24/22   Leonie Man, MD  atorvastatin (LIPITOR) 10 MG tablet Take 1 tablet (10 mg total) by mouth daily. 10/12/21   [name], MD  benzonatate (TESSALON) 200 MG capsule Take 1 capsule (200 mg total) by mouth 3 (three) times daily as needed for cough. Patient not taking: Reported on 02/11/2022 09/22/19   [name], MD  betamethasone dipropionate 0.05 % cream Apply topically 2 (two) times daily. 09/20/21   [name], MD  calcium carbonate (OS-CAL) 600 MG tablet Take 600 mg by mouth daily.    [provider]  cetirizine (ZYRTEC) 10 MG tablet Take 10 mg by mouth daily.    [provider]  Cinnamon 500 MG TABS Take 500 mg by mouth daily.    [provider]  colchicine 0.6 MG tablet Take 1 tablet (0.6 mg total) by mouth 2 (two) times daily. As  needed for gout flare. 12/11/18   [name], MD  doxycycline (VIBRAMYCIN) 100 MG capsule Take 1 capsule (100 mg total) by mouth 2 (two) times daily for 7 days. 08/08/22 08/15/22 Yes Sharion Balloon, NP  empagliflozin (JARDIANCE) 10 MG TABS tablet Take 1 tablet (10 mg total) by mouth daily before breakfast. Patient not taking: Reported on 04/18/2022 02/12/22   [name], MD  fluticasone-salmeterol (ADVAIR DISKUS) 250-50 MCG/ACT AEPB TAKE 1 PUFF BY MOUTH TWICE A DAY 08/30/21   [name], MD  furosemide (LASIX) 20 MG tablet Take 1 tablet by mouth every other day Patient not taking: Reported on 04/18/2022 12/18/21   Kennyth Arnold, FNP  losartan (COZAAR) 100 MG tablet Take 1 tablet (100 mg total) by mouth at bedtime. 07/23/22   [name], MD  Lutein 20 MG TABS Take 20 mg by mouth daily.    [provider]  Magnesium 250 MG TABS Take 250 mg by mouth daily.    [provider]  metoprolol tartrate (LOPRESSOR) 25 MG tablet Take 1 tablet (25 mg total) by mouth 2 (two) times daily. 01/24/22   Leonie Man, MD  mirtazapine (REMERON) 45 MG tablet Take 1 tablet (45 mg total) by mouth at bedtime. 03/05/22   [name], MD  montelukast (SINGULAIR) 10 MG tablet Take 1 tablet (10 mg total) by mouth at bedtime. 10/12/21    [name], MD  Multiple Vitamins-Minerals (CENTRUM MINIS WOMEN 50+) TABS Take 1 tablet by mouth daily.    [provider]  Omega-3 Fatty Acids (FISH OIL) 1000 MG CAPS Take 1,000 mg by mouth daily.    [provider]  OVER THE COUNTER MEDICATION Place 2 sprays into both nostrils at bedtime. Nasocort nasal spray    [provider]  pantoprazole (PROTONIX) 40 MG tablet Take 1 tablet (40 mg total) by mouth daily. 02/11/22   [name], MD  predniSONE (DELTASONE) 10 MG tablet Take 4 tablets (40 mg total) by mouth daily for 5 days. 08/08/22 08/13/22 Yes Sharion Balloon, NP  Probiotic Product (PROBIOTIC PO) Take 1 tablet by mouth daily.    [provider]  traZODone (DESYREL) 50 MG tablet Take 0.5-1 tablets (25-50 mg total) by mouth at bedtime as needed for sleep. 06/12/21   [name], MD  triamcinolone lotion (KENALOG) 0.1 % Apply 1 application topically 2 (two) times daily as needed.    [provider]  mometasone (NASONEX) 50 MCG/ACT nasal spray Place 2 sprays into the nose daily. 01/02/11 07/24/11  [name], MD    Family History Family History  Problem Relation Age of Onset   Myasthenia gravis Mother    Cervical cancer Mother    Heart disease Father    Coronary artery disease Father    Valvular heart disease Father    Heart attack Father 42   Cancer Sister 65       colon   Colon cancer Sister 43   Heart Problems Sister    Heart Problems Brother    Heart attack Paternal Grandfather        58's   Breast cancer Neg Hx    Esophageal cancer Neg Hx    Colon polyps Neg Hx    Rectal cancer Neg Hx    Stomach cancer Neg Hx     Social History Social History   Tobacco Use   Smoking status: Former    Types: Cigarettes  Quit date: 01/02/1971    Years since quitting: 51.6   Smokeless tobacco: Never  Vaping Use   Vaping Use: Never used  Substance Use Topics   Alcohol use: Not Currently    Comment: occasional- wine gives pt  headache only drinks once every 6 months   Drug use: No     Allergies   Hydrochlorothiazide and Nifedipine   Review of Systems Review of Systems  Constitutional:  Negative for chills and fever.  HENT:  Positive for congestion. Negative for ear pain and sore throat.   Respiratory:  Positive for cough. Negative for shortness of breath.   Cardiovascular:  Negative for chest pain and palpitations.  Skin:  Negative for rash.  All other systems reviewed and are negative.    Physical Exam Triage Vital Signs ED Triage Vitals  Enc Vitals Group     BP 08/08/22 1421 (!) 153/79     Pulse Rate 08/08/22 1421 60     Resp 08/08/22 1421 20     Temp 08/08/22 1421 97.9 F (36.6 C)     Temp src --      SpO2 08/08/22 1421 97 %     Weight 08/08/22 1419 195 lb (88.5 kg)     Height 08/08/22 1419 5\' 4"  (1.626 m)     Head Circumference --      Peak Flow --      Pain Score 08/08/22 1419 0     Pain Loc --      Pain Edu? --      Excl. in [place]? --    No data found.  Updated Vital Signs BP (!) 153/79   Pulse 60   Temp 97.9 F (36.6 C)   Resp 20   Ht 5\' 4"  (1.626 m)   Wt 195 lb (88.5 kg)   SpO2 97%   BMI 33.47 kg/m   Visual Acuity Right Eye Distance:   Left Eye Distance:   Bilateral Distance:    Right Eye Near:   Left Eye Near:    Bilateral Near:     Physical Exam Vitals and nursing note reviewed.  Constitutional:      General: She is not in acute distress.    Appearance: She is well-developed. She is not ill-appearing.  HENT:     Right Ear: Tympanic membrane normal.     Left Ear: Tympanic membrane normal.     Nose: Nose normal.     Mouth/Throat:     Mouth: Mucous membranes are moist.     Pharynx: Oropharynx is clear.  Cardiovascular:     Rate and Rhythm: Normal rate and regular rhythm.     Heart sounds: Normal heart sounds.  Pulmonary:     Effort: Pulmonary effort is normal. No respiratory distress.     Breath sounds: Normal breath sounds.  Musculoskeletal:      Cervical back: Neck supple.  Skin:    General: Skin is warm and dry.  Neurological:     Mental Status: She is alert.  Psychiatric:        Mood and Affect: Mood normal.        Behavior: Behavior normal.      UC Treatments / Results  Labs (all labs ordered are listed, but only abnormal results are displayed) Labs Reviewed - No data to display  EKG   Radiology No results found.  Procedures Procedures (including critical care time)  Medications Ordered in UC Medications - No data to display  Initial Impression /  Assessment and Plan / UC Course  I have reviewed the triage vital signs and the nursing notes.  Pertinent labs & imaging results that were available during my care of the patient were reviewed by me and considered in my medical decision making (see chart for details).    COPD exacerbation, elevated blood pressure with hypertension.  Afebrile, lungs are clear, O2 sat 97% on room air.  Treating with albuterol inhaler, prednisone, doxycycline.  Education provided on COPD exacerbation.  Discussed with patient that no refill her Advair inhaler given today as she is not used it for over a year and that she should follow-up with her PCP about a maintenance inhaler if needed.  Also discussed with patient that her blood pressure is elevated today and needs to be rechecked by PCP in 2 to 4 weeks.  Education provided on managing hypertension.  She agrees to plan of care.   Final Clinical Impressions(s) / UC Diagnoses   Final diagnoses:  COPD exacerbation (HCC)  Elevated blood pressure reading in office with diagnosis of hypertension     Discharge Instructions      Use the albuterol inhaler as directed.  Take the prednisone and doxycycline as directed.  Follow up with your primary care provider next week.    Your blood pressure is elevated today at 153/79.  Please have this rechecked by your primary care provider in 2-4 weeks.           ED Prescriptions      Medication Sig Dispense Auth. Provider   albuterol (VENTOLIN HFA) 108 (90 Base) MCG/ACT inhaler Inhale 1-2 puffs into the lungs every 6 (six) hours as needed. 18 g Sharion Balloon, NP   predniSONE (DELTASONE) 10 MG tablet Take 4 tablets (40 mg total) by mouth daily for 5 days. 20 tablet Sharion Balloon, NP   doxycycline (VIBRAMYCIN) 100 MG capsule Take 1 capsule (100 mg total) by mouth 2 (two) times daily for 7 days. 14 capsule Sharion Balloon, NP      PDMP not reviewed this encounter.   Sharion Balloon, NP 08/08/22 1504

## 2022-08-08 NOTE — Discharge Instructions (Addendum)
Use the albuterol inhaler as directed.  Take the prednisone and doxycycline as directed.  Follow up with your primary care provider next week.    Your blood pressure is elevated today at 153/79.  Please have this rechecked by your primary care provider in 2-4 weeks.

## 2022-08-08 NOTE — ED Triage Notes (Signed)
Patient to Urgent Care with complaints of cough/ nasal congestion. Symptoms started five days ago.   Cough productive in the morning. Denies any known fevers.  Hx of COPD/ asthma. Reports both of her inhalers are expired 2023/2022 (Advair and albuterol). Using delsym DM.

## 2022-08-08 NOTE — Telephone Encounter (Signed)
LVM to schedule f/u appt

## 2022-08-12 MED ORDER — METOPROLOL TARTRATE 25 MG PO TABS: 25.0000 mg | ORAL_TABLET | Freq: Two times a day (BID) | ORAL | 0 refills | Status: DC

## 2022-08-14 ENCOUNTER — Other Ambulatory Visit: Payer: Self-pay

## 2022-08-14 MED ORDER — MIRTAZAPINE 45 MG PO TABS: 45.0000 mg | ORAL_TABLET | Freq: Every day | ORAL | 1 refills | Status: DC

## 2022-08-21 ENCOUNTER — Other Ambulatory Visit: Payer: Self-pay | Admitting: Internal Medicine

## 2022-08-28 ENCOUNTER — Telehealth: Payer: Self-pay | Admitting: Internal Medicine

## 2022-08-28 NOTE — Telephone Encounter (Signed)
Called patient to schedule Medicare Annual Wellness Visit (AWV). Left message for patient to call back and schedule Medicare Annual Wellness Visit (AWV).  Last date of AWV: 08/30/2021   Please schedule an AWVS appointment at any time with [facility] VISIT.  If any questions, please contact me at [phone].    Thank you,  [facility] Support [facility] Direct dial  [phone]

## 2022-09-05 ENCOUNTER — Ambulatory Visit (INDEPENDENT_AMBULATORY_CARE_PROVIDER_SITE_OTHER): Payer: PPO

## 2022-09-05 VITALS — Ht 64.0 in | Wt 195.0 lb

## 2022-09-05 DIAGNOSIS — Z0000 Encounter for general adult medical examination without abnormal findings: Secondary | ICD-10-CM | POA: Diagnosis not present

## 2022-09-05 NOTE — Patient Instructions (Addendum)
Kari [name] , Thank you for taking time to come for your Medicare Wellness Visit. I appreciate your ongoing commitment to your health goals. Please review the following plan we discussed and let me know if I can assist you in the future.   These are the goals we discussed:  Goals      Increase physical activity     Use stationary bike Walk more for exercise Exercise 5 days, 30 minutes        This is a list of the screening recommended for you and due dates:  Health Maintenance  Topic Date Due   DTaP/Tdap/Td vaccine (2 - Td or Tdap) 03/03/2018   Zoster (Shingles) Vaccine (2 of 2) 06/14/2022   Hemoglobin A1C  08/13/2022   Complete foot exam   09/20/2022   Flu Shot  12/12/2022   Yearly kidney function blood test for diabetes  02/12/2023   Yearly kidney health urinalysis for diabetes  02/12/2023   Eye exam for diabetics  02/25/2023   Mammogram  03/01/2023   Medicare Annual Wellness Visit  09/05/2023   Colon Cancer Screening  12/12/2025   Pneumonia Vaccine  Completed   DEXA scan (bone density measurement)  Completed   COVID-19 Vaccine  Completed   Hepatitis C Screening: USPSTF Recommendation to screen - Ages 75-79 yo.  Completed   HPV Vaccine  Aged Out    Advanced directives: End of life planning; Advance aging; Advanced directives discussed.  Copy of current HCPOA/Living Will requested.    Conditions/risks identified: none new  Next appointment: Follow up in one year for your annual wellness visit    Preventive Care 65 Years and Older, Female Preventive care refers to lifestyle choices and visits with your health care provider that can promote health and wellness. What does preventive care include? A yearly physical exam. This is also called an annual well check. Dental exams once or twice a year. Routine eye exams. Ask your health care provider how often you should have your eyes checked. Personal lifestyle choices, including: Daily care of your teeth and gums. Regular  physical activity. Eating a healthy diet. Avoiding tobacco and drug use. Limiting alcohol use. Practicing safe sex. Taking low-dose aspirin every day. Taking vitamin and mineral supplements as recommended by your health care provider. What happens during an annual well check? The services and screenings done by your health care provider during your annual well check will depend on your age, overall health, lifestyle risk factors, and family history of disease. Counseling  Your health care provider may ask you questions about your: Alcohol use. Tobacco use. Drug use. Emotional well-being. Home and relationship well-being. Sexual activity. Eating habits. History of falls. Memory and ability to understand (cognition). Work and work Astronomer. Reproductive health. Screening  You may have the following tests or measurements: Height, weight, and BMI. Blood pressure. Lipid and cholesterol levels. These may be checked every 5 years, or more frequently if you are over 11 years old. Skin check. Lung cancer screening. You may have this screening every year starting at age 57 if you have a 30-pack-year history of smoking and currently smoke or have quit within the past 15 years. Fecal occult blood test (FOBT) of the stool. You may have this test every year starting at age 38. Flexible sigmoidoscopy or colonoscopy. You may have a sigmoidoscopy every 5 years or a colonoscopy every 10 years starting at age 46. Hepatitis C blood test. Hepatitis B blood test. Sexually transmitted disease (STD) testing. Diabetes screening.  This is done by checking your blood sugar (glucose) after you have not eaten for a while (fasting). You may have this done every 1-3 years. Bone density scan. This is done to screen for osteoporosis. You may have this done starting at age 77. Mammogram. This may be done every 1-2 years. Talk to your health care provider about how often you should have regular mammograms. Talk  with your health care provider about your test results, treatment options, and if necessary, the need for more tests. Vaccines  Your health care provider may recommend certain vaccines, such as: Influenza vaccine. This is recommended every year. Tetanus, diphtheria, and acellular pertussis (Tdap, Td) vaccine. You may need a Td booster every 10 years. Zoster vaccine. You may need this after age 40. Pneumococcal 13-valent conjugate (PCV13) vaccine. One dose is recommended after age 58. Pneumococcal polysaccharide (PPSV23) vaccine. One dose is recommended after age 70. Talk to your health care provider about which screenings and vaccines you need and how often you need them. This information is not intended to replace advice given to you by your health care provider. Make sure you discuss any questions you have with your health care provider. Document Released: 05/26/2015 Document Revised: 01/17/2016 Document Reviewed: 02/28/2015 Elsevier Interactive Patient Education  2017 [employer] Prevention in the Home Falls can cause injuries. They can happen to people of all ages. There are many things you can do to make your home safe and to help prevent falls. What can I do on the outside of my home? Regularly fix the edges of walkways and driveways and fix any cracks. Remove anything that might make you trip as you walk through a door, such as a raised step or threshold. Trim any bushes or trees on the path to your home. Use bright outdoor lighting. Clear any walking paths of anything that might make someone trip, such as rocks or tools. Regularly check to see if handrails are loose or broken. Make sure that both sides of any steps have handrails. Any raised decks and porches should have guardrails on the edges. Have any leaves, snow, or ice cleared regularly. Use sand or salt on walking paths during winter. Clean up any spills in your garage right away. This includes oil or grease  spills. What can I do in the bathroom? Use night lights. Install grab bars by the toilet and in the tub and shower. Do not use towel bars as grab bars. Use non-skid mats or decals in the tub or shower. If you need to sit down in the shower, use a plastic, non-slip stool. Keep the floor dry. Clean up any water that spills on the floor as soon as it happens. Remove soap buildup in the tub or shower regularly. Attach bath mats securely with double-sided non-slip rug tape. Do not have throw rugs and other things on the floor that can make you trip. What can I do in the bedroom? Use night lights. Make sure that you have a light by your bed that is easy to reach. Do not use any sheets or blankets that are too big for your bed. They should not hang down onto the floor. Have a firm chair that has side arms. You can use this for support while you get dressed. Do not have throw rugs and other things on the floor that can make you trip. What can I do in the kitchen? Clean up any spills right away. Avoid walking on wet floors. Keep items  that you use a lot in easy-to-reach places. If you need to reach something above you, use a strong step stool that has a grab bar. Keep electrical cords out of the way. Do not use floor polish or wax that makes floors slippery. If you must use wax, use non-skid floor wax. Do not have throw rugs and other things on the floor that can make you trip. What can I do with my stairs? Do not leave any items on the stairs. Make sure that there are handrails on both sides of the stairs and use them. Fix handrails that are broken or loose. Make sure that handrails are as long as the stairways. Check any carpeting to make sure that it is firmly attached to the stairs. Fix any carpet that is loose or worn. Avoid having throw rugs at the top or bottom of the stairs. If you do have throw rugs, attach them to the floor with carpet tape. Make sure that you have a light switch at the  top of the stairs and the bottom of the stairs. If you do not have them, ask someone to add them for you. What else can I do to help prevent falls? Wear shoes that: Do not have high heels. Have rubber bottoms. Are comfortable and fit you well. Are closed at the toe. Do not wear sandals. If you use a stepladder: Make sure that it is fully opened. Do not climb a closed stepladder. Make sure that both sides of the stepladder are locked into place. Ask someone to hold it for you, if possible. Clearly mark and make sure that you can see: Any grab bars or handrails. First and last steps. Where the edge of each step is. Use tools that help you move around (mobility aids) if they are needed. These include: Canes. Walkers. Scooters. Crutches. Turn on the lights when you go into a dark area. Replace any light bulbs as soon as they burn out. Set up your furniture so you have a clear path. Avoid moving your furniture around. If any of your floors are uneven, fix them. If there are any pets around you, be aware of where they are. Review your medicines with your doctor. Some medicines can make you feel dizzy. This can increase your chance of falling. Ask your doctor what other things that you can do to help prevent falls. This information is not intended to replace advice given to you by your health care provider. Make sure you discuss any questions you have with your health care provider. Document Released: 02/23/2009 Document Revised: 10/05/2015 Document Reviewed: 06/03/2014 Elsevier Interactive Patient Education  2017 [employer].

## 2022-09-05 NOTE — Progress Notes (Addendum)
Subjective:   Kari [name] is a 77 y.o. female who presents for Medicare Annual (Subsequent) preventive examination.  Review of Systems    No ROS.  Medicare Wellness Virtual Visit.  Visual/audio telehealth visit, UTA vital signs.   See social history for additional risk factors.   Cardiac Risk Factors include: advanced age (>47men, >81 women)     Objective:    Today's Vitals   09/05/22 1306  Weight: 195 lb (88.5 kg)  Height:  (1.626 m)   Body mass index is 33.47 kg/m.     09/05/2022    1:07 PM 08/08/2022    2:20 PM 08/30/2021    9:55 AM 04/30/2021    9:00 PM 04/30/2021   11:45 AM 04/09/2021    8:45 AM 08/29/2020    2:54 PM  Advanced Directives  Does Patient Have a Medical Advance Directive? Yes Yes Yes No  No Yes  Type of Estate agent of [place];Living will Living will Living will;Healthcare Power of [place] Power of Attorney  Does patient want to make changes to medical advance directive? No - Patient declined  No - Patient declined    No - Patient declined  Copy of Healthcare Power of Attorney in Chart? No - copy requested  No - copy requested    No - copy requested  Would patient like information on creating a medical advance directive?    No - Patient declined No - Patient declined No - Patient declined     Current Medications (verified) Outpatient Encounter Medications as of 09/05/2022  Medication Sig   albuterol (VENTOLIN HFA) 108 (90 Base) MCG/ACT inhaler Inhale 1-2 puffs into the lungs every 6 (six) hours as needed.   allopurinol (ZYLOPRIM) 300 MG tablet Take 1 tablet (300 mg total) by mouth daily.   apixaban (ELIQUIS) 5 MG TABS tablet Take 1 tablet (5 mg total) by mouth 2 (two) times daily.   atorvastatin (LIPITOR) 10 MG tablet Take 1 tablet (10 mg total) by mouth daily.   benzonatate (TESSALON) 200 MG capsule Take 1 capsule (200 mg total) by mouth 3 (three) times daily as needed for cough. (Patient not taking: Reported  on 02/11/2022)   betamethasone dipropionate 0.05 % cream Apply topically 2 (two) times daily.   calcium carbonate (OS-CAL) 600 MG tablet Take 600 mg by mouth daily.   cetirizine (ZYRTEC) 10 MG tablet Take 10 mg by mouth daily.   Cinnamon 500 MG TABS Take 500 mg by mouth daily.   colchicine 0.6 MG tablet Take 1 tablet (0.6 mg total) by mouth 2 (two) times daily. As needed for gout flare.   empagliflozin (JARDIANCE) 10 MG TABS tablet Take 1 tablet (10 mg total) by mouth daily before breakfast. (Patient not taking: Reported on 04/18/2022)   fluticasone-salmeterol (ADVAIR DISKUS) 250-50 MCG/ACT AEPB TAKE 1 PUFF BY MOUTH TWICE A DAY   furosemide (LASIX) 20 MG tablet Take 1 tablet by mouth every other day (Patient not taking: Reported on 04/18/2022)   losartan (COZAAR) 100 MG tablet Take 1 tablet (100 mg total) by mouth at bedtime.   Lutein 20 MG TABS Take 20 mg by mouth daily.   Magnesium 250 MG TABS Take 250 mg by mouth daily.   metoprolol tartrate (LOPRESSOR) 25 MG tablet Take 1 tablet (25 mg total) by mouth 2 (two) times daily.   mirtazapine (REMERON) 45 MG tablet Take 1 tablet (45 mg total) by mouth at bedtime.   montelukast (SINGULAIR) 10 MG  tablet Take 1 tablet (10 mg total) by mouth at bedtime.   Multiple Vitamins-Minerals (CENTRUM MINIS WOMEN 50+) TABS Take 1 tablet by mouth daily.   Omega-3 Fatty Acids (FISH OIL) 1000 MG CAPS Take 1,000 mg by mouth daily.   OVER THE COUNTER MEDICATION Place 2 sprays into both nostrils at bedtime. Nasocort nasal spray   pantoprazole (PROTONIX) 40 MG tablet Take 1 tablet (40 mg total) by mouth daily.   Probiotic Product (PROBIOTIC PO) Take 1 tablet by mouth daily.   traZODone (DESYREL) 50 MG tablet Take 1/2 to 1 tablet by mouth at bedtime as needed for sleep   triamcinolone lotion (KENALOG) 0.1 % Apply 1 application topically 2 (two) times daily as needed.   [DISCONTINUED] mometasone (NASONEX) 50 MCG/ACT nasal spray Place 2 sprays into the nose daily.   No  facility-administered encounter medications on file as of 09/05/2022.    Allergies (verified) Hydrochlorothiazide and Nifedipine   History: Past Medical History:  Diagnosis Date   Allergy    to cat, dogs, pollen, dust, cigarette smoke   Arthritis    feet   Cataract    removed both eyes   COPD (chronic obstructive pulmonary disease) with chronic bronchitis    And moderate persistent asthma; complicated by Restrictive Lung Disease from obesity   Coronary artery disease involving native coronary artery of native heart with other form of angina pectoris    a. 04/2021 Cor CTA: Cor Ca2+ 389. Domninant RCA-RPDA<PLA. RCA >70p (abnl CTFFR), LAD 50-69p, LCX Min dz; b. 04/2021 Cath/PCI: LM nl, LAD mild diff dzs, D1/2/3 nl, LCX min irregs, OM1/2/3 nl, RCA 99p (2.75x30 Onyx Frontier DES).   Depression    Diastolic dysfunction    a. 04/2021 Echo: EF 60-65%, GrI DD. Nl RV fxn. Mildly dil LA.   Esophageal dysmotility 04/30/2022   Barium swallow 04/30/2022-also has impingement of tablet at aortic arch, we know from CT angiography that she has mild aortic enlargement and pulmonary outflow tract enlargement   GERD (gastroesophageal reflux disease)    Hx of adenomatous colonic polyps 05/30/2010   Hyperlipidemia    Hypertension    Lymphedema of both lower extremities    No superficial venous reflux noted.  However right-sided saphenofemoral junction reflux noted and left common femoral vein reflux noted.  No DVT or superficial thrombus.   Menopause    Migraines    Miscarriage    X 1   Moderate persistent asthma    Obesity (BMI 30.0-34.9) 03/04/2013   PONV (postoperative nausea and vomiting)    Past Surgical History:  Procedure Laterality Date   CATARACT EXTRACTION, BILATERAL     CESAREAN SECTION     X 2   COLONOSCOPY     CORONARY STENT INTERVENTION N/A 04/30/2021   Procedure: CORONARY STENT INTERVENTION;  Surgeon: Iran Ouch, MD;  Location: [facility] INVASIVE CV LAB;  Service:  Cardiovascular;  Proximal RCA 99% => DES PCI with Onyx Frontier 2.75 mm x 30 mm   DENTAL SURGERY  09/2016   EYE SURGERY     eye lid surgery 11/2021   LEFT HEART CATH AND CORONARY ANGIOGRAPHY Left 04/30/2021   Procedure: LEFT HEART CATH AND CORONARY ANGIOGRAPHY;  Surgeon: Iran Ouch, MD;  Location: [facility] INVASIVE CV LAB;  Service: Cardiovascular;; Proximal RCA 99% => DES PCI;  Ony; mild diffuse LAD and minimal diffuse LCx disease.  Moderately elevated EDP (22 mmHg).  EF 55-65 %.   POLYPECTOMY     TRANSTHORACIC ECHOCARDIOGRAM  11/04/2019  a) EF 55 to 60%.  Normal LV size and function.  GR 1 DD.  Unable to assess.  Mild LA dilation.  Normal aortic and mitral valve.  Normal RVP.  (Normal study);; b) 04/2021: Normal LV size and function.  EF 60 to 65%.  No RWMA.  GR 1 DD.Normal RV size and function.  Normal aortic and mitral valves.   TUBAL LIGATION  1980   Family History  Problem Relation Age of Onset   Myasthenia gravis Mother    Cervical cancer Mother    Heart disease Father    Coronary artery disease Father    Valvular heart disease Father    Heart attack Father 52   Cancer Sister 51       colon   Colon cancer Sister 43   Heart Problems Sister    Heart Problems Brother    Heart attack Paternal Grandfather        21's   Breast cancer Neg Hx    Esophageal cancer Neg Hx    Colon polyps Neg Hx    Rectal cancer Neg Hx    Stomach cancer Neg Hx    Social History   Socioeconomic History   Marital status: Married    Spouse name: Not on file   Number of children: Not on file   Years of education: Not on file   Highest education level: Not on file  Occupational History   Not on file  Tobacco Use   Smoking status: Former    Types: Cigarettes    Quit date: 01/02/1971    Years since quitting: 51.7   Smokeless tobacco: Never  Vaping Use   Vaping Use: Never used  Substance and Sexual Activity   Alcohol use: Not Currently    Comment: occasional- wine gives pt headache only  drinks once every 6 months   Drug use: No   Sexual activity: Not Currently  Other Topics Concern   Not on file  Social History Narrative   Married, works full-time for a International aid/development worker   Former smoker no alcohol tobacco or drug use at this time   Social Determinants of Corporate investment banker Strain: Low Risk  (09/02/2022)   Overall Financial Resource Strain (CARDIA)    Difficulty of Paying Living Expenses: Not very hard  Food Insecurity: No Food Insecurity (09/02/2022)   Hunger Vital Sign    Worried About Running Out of Food in the Last Year: Never true    Ran Out of Food in the Last Year: Never true  Transportation Needs: No Transportation Needs (09/02/2022)   PRAPARE - Administrator, Civil Service (Medical): No    Lack of Transportation (Non-Medical): No  Physical Activity: Insufficiently Active (09/02/2022)   Exercise Vital Sign    Days of Exercise per Week: 1 day    Minutes of Exercise per Session: 30 min  Stress: No Stress Concern Present (09/02/2022)   [employer] of Occupational Health - Occupational Stress Questionnaire    Feeling of Stress : Only a little  Social Connections: Unknown (09/02/2022)   Social Connection and Isolation Panel [NHANES]    Frequency of Communication with Friends and Family: Not on file    Frequency of Social Gatherings with Friends and Family: Patient declined    Attends Religious Services: Not on Insurance claims handler of Clubs or Organizations: No    Attends Banker Meetings: Never    Marital Status: Married    Tobacco Counseling  Counseling given: Not Answered   Clinical Intake:  Pre-visit preparation completed: Yes        Diabetes: No  How often do you need to have someone help you when you read instructions, pamphlets, or other written materials from your doctor or pharmacy?: 1 - Never    Interpreter Needed?: No      Activities of Daily Living    09/02/2022   10:41 AM  In your present  state of health, do you have any difficulty performing the following activities:  Hearing? 0  Vision? 0  Difficulty concentrating or making decisions? 0  Walking or climbing stairs? 1  Comment Leg weakness. Paces self with activity  Dressing or bathing? 0  Doing errands, shopping? 0  Preparing Food and eating ? N  Using the Toilet? N  In the past six months, have you accidently leaked urine? Y  Comment Self managed. Declines further follow up at this time.  Do you have problems with loss of bowel control? Y  Comment Self managed. Declines further follow up at this time.  Managing your Medications? N  Managing your Finances? N  Housekeeping or managing your Housekeeping? N    Patient Care Team: Sherlene Shams, MD as PCP - General (Internal Medicine) Marykay Lex, MD as PCP - Cardiology (Cardiology)  Indicate any recent Medical Services you may have received from other than [facility] providers in the past year (date may be approximate).     Assessment:   This is a routine wellness examination for Ceairra.  Patient Medicare AWV questionnaire was completed by the patient on 09/02/22, I have confirmed that all information answered by patient is correct and no changes since this date.   I connected with  Aima Mcwhirt on 09/05/22 by a audio enabled telemedicine application and verified that I am speaking with the correct person using two identifiers.  Patient Location: Home  Provider Location: Office/Clinic  I discussed the limitations of evaluation and management by telemedicine. The patient expressed understanding and agreed to proceed.   Hearing/Vision screen Hearing Screening - Comments:: Patient is able to hear conversational tones without difficulty. No issues reported.  Vision Screening - Comments:: Followed by Dr. Larence Penning Wears corrective lenses  Dietary issues and exercise activities discussed: Current Exercise Habits: Home exercise routine, Type of exercise: walking,  Intensity: Mild Healthy diet   Goals Addressed             This Visit's Progress    Increase physical activity   On track    Use stationary bike Walk more for exercise Exercise 5 days, 30 minutes       Depression Screen    09/05/2022    1:11 PM 02/11/2022    9:49 AM 08/30/2021    9:54 AM 07/16/2021   11:10 AM 04/16/2021    2:38 PM 03/22/2021   10:07 AM 09/08/2020    2:08 PM  PHQ 2/9 Scores  PHQ - 2 Score 0 0 0 0 0 0 0  PHQ- 9 Score       2    Fall Risk    09/02/2022   10:41 AM 02/11/2022    9:49 AM 08/30/2021   10:08 AM 07/16/2021   11:10 AM 04/16/2021    2:38 PM  Fall Risk   Falls in the past year? 1 0 0 0 0  Number falls in past yr: 0  0    Injury with Fall? 0      Risk for fall due  to :  No Fall Risks  No Fall Risks No Fall Risks  Follow up Falls evaluation completed;Falls prevention discussed Falls evaluation completed Falls evaluation completed Falls evaluation completed Falls evaluation completed    FALL RISK PREVENTION PERTAINING TO THE HOME: Home free of loose throw rugs in walkways, pet beds, electrical cords, etc? Yes  Adequate lighting in your home to reduce risk of falls? Yes   ASSISTIVE DEVICES UTILIZED TO PREVENT FALLS: Life alert? No  Use of a cane, walker or w/c? No  Grab bars in the bathroom? Yes Shower chair or bench in shower? No  Comfort chair height toilet? Yes   TIMED UP AND GO: Was the test performed? No .    Cognitive Function:    05/28/2017   11:35 AM  MMSE - Mini Mental State Exam  Orientation to time 5  Orientation to Place 5  Registration 3  Attention/ Calculation 5  Recall 3  Language- name 2 objects 2  Language- repeat 1  Language- follow 3 step command 3  Language- read & follow direction 1  Write a sentence 1  Copy design 1  Total score 30        09/05/2022    1:36 PM 08/20/2019    9:40 AM 06/01/2018   10:36 AM  6CIT Screen  What Year? 0 points 0 points 0 points  What month? 0 points 0 points 0 points  What time?  0 points 0 points 0 points  Count back from 20 0 points 0 points 0 points  Months in reverse 0 points 0 points 0 points  Repeat phrase 0 points 0 points 0 points  Total Score 0 points 0 points 0 points    Immunizations Immunization History  Administered Date(s) Administered   Covid-19, Mrna,Vaccine(Spikevax)39yrs and older 03/08/2022   Fluad Quad(high Dose 65+) 02/28/2020, 02/11/2022   Influenza, High Dose Seasonal PF 02/24/2018, 03/15/2019   Influenza,inj,Quad PF,6+ Mos 03/03/2013   Influenza-Unspecified 01/11/2014, 02/13/2015, 02/27/2016, 03/04/2017, 02/25/2021   Moderna Covid-19 Vaccine Bivalent Booster 6yrs & up 02/25/2021   PFIZER Comirnaty(Gray Top)Covid-19 Tri-Sucrose Vaccine 11/09/2020   PFIZER(Purple Top)SARS-COV-2 Vaccination 07/02/2019, 07/23/2019, 02/28/2020   PNEUMOCOCCAL CONJUGATE-20 09/19/2021   Pneumococcal Conjugate-13 03/03/2013   Pneumococcal Polysaccharide-23 08/14/2015   Respiratory Syncytial Virus Vaccine,Recomb Aduvanted(Arexvy) 03/22/2022   Tdap 03/03/2008   Zoster Recombinat (Shingrix) 04/19/2022    TDAP status: Due, Education has been provided regarding the importance of this vaccine. Advised may receive this vaccine at local pharmacy or Health Dept. Aware to provide a copy of the vaccination record if obtained from local pharmacy or Health Dept. Verbalized acceptance and understanding.  Shingrix vaccine- agrees to update immunization record upon completion.   Screening Tests Health Maintenance  Topic Date Due   DTaP/Tdap/Td (2 - Td or Tdap) 03/03/2018   Zoster Vaccines- Shingrix (2 of 2) 06/14/2022   HEMOGLOBIN A1C  08/13/2022   FOOT EXAM  09/20/2022   INFLUENZA VACCINE  12/12/2022   Diabetic kidney evaluation - eGFR measurement  02/12/2023   Diabetic kidney evaluation - Urine ACR  02/12/2023   OPHTHALMOLOGY EXAM  02/25/2023   MAMMOGRAM  03/01/2023   Medicare Annual Wellness (AWV)  09/05/2023   COLONOSCOPY (Pts 45-91yrs Insurance coverage will  need to be confirmed)  12/12/2025   Pneumonia Vaccine 34+ Years old  Completed   DEXA SCAN  Completed   COVID-19 Vaccine  Completed   Hepatitis C Screening  Completed   HPV VACCINES  Aged Out    Health Maintenance Health Maintenance  Due  Topic Date Due   DTaP/Tdap/Td (2 - Td or Tdap) 03/03/2018   Zoster Vaccines- Shingrix (2 of 2) 06/14/2022   HEMOGLOBIN A1C  08/13/2022   Lung Cancer Screening: (Low Dose CT Chest recommended if Age 30-80 years, 30 pack-year currently smoking OR have quit w/in 15years.) does not qualify.   Hepatitis C Screening: Completed 06/2016.  Vision Screening: Recommended annual ophthalmology exams for early detection of glaucoma and other disorders of the eye.  Dental Screening: Recommended annual dental exams for proper oral hygiene  Community Resource Referral / Chronic Care Management: CRR required this visit?  No   CCM required this visit?  No      Plan:     I have personally reviewed and noted the following in the patient's chart:   Medical and social history Use of alcohol, tobacco or illicit drugs  Current medications and supplements including opioid prescriptions. Patient is not currently taking opioid prescriptions. Functional ability and status Nutritional status Physical activity Advanced directives List of other physicians Hospitalizations, surgeries, and ER visits in previous 12 months Vitals Screenings to include cognitive, depression, and falls Referrals and appointments  In addition, I have reviewed and discussed with patient certain preventive protocols, quality metrics, and best practice recommendations. A written personalized care plan for preventive services as well as general preventive health recommendations were provided to patient.     Zarek Relph L Motley, LPN   [id number]/61/0960       I have reviewed the above information and agree with above.   Duncan Dull, MD

## 2022-09-11 ENCOUNTER — Ambulatory Visit: Payer: PPO | Attending: Nurse Practitioner | Admitting: Nurse Practitioner

## 2022-09-11 ENCOUNTER — Encounter: Payer: Self-pay | Admitting: Nurse Practitioner

## 2022-09-11 VITALS — BP 178/84 | HR 58 | Ht 64.0 in | Wt 198.8 lb

## 2022-09-11 DIAGNOSIS — I480 Paroxysmal atrial fibrillation: Secondary | ICD-10-CM | POA: Diagnosis not present

## 2022-09-11 DIAGNOSIS — R002 Palpitations: Secondary | ICD-10-CM | POA: Diagnosis not present

## 2022-09-11 DIAGNOSIS — E785 Hyperlipidemia, unspecified: Secondary | ICD-10-CM

## 2022-09-11 DIAGNOSIS — I2510 Atherosclerotic heart disease of native coronary artery without angina pectoris: Secondary | ICD-10-CM

## 2022-09-11 DIAGNOSIS — I10 Essential (primary) hypertension: Secondary | ICD-10-CM | POA: Diagnosis not present

## 2022-09-11 MED ORDER — CARVEDILOL 3.125 MG PO TABS: 3.1250 mg | ORAL_TABLET | Freq: Two times a day (BID) | ORAL | 3 refills | Status: DC

## 2022-09-11 MED ORDER — CARVEDILOL 3.125 MG PO TABS: 3.1250 mg | ORAL_TABLET | Freq: Two times a day (BID) | ORAL | 0 refills | Status: DC

## 2022-09-11 MED ORDER — ATORVASTATIN CALCIUM 40 MG PO TABS: 40.0000 mg | ORAL_TABLET | Freq: Every day | ORAL | 3 refills | Status: DC

## 2022-09-11 NOTE — Progress Notes (Signed)
Office Visit    Patient Name: Kari [name] Date of Encounter: 09/11/2022  Primary Care Provider:  Sherlene Shams, MD Primary Cardiologist:  Kari Lemma, MD  Chief Complaint    77 year old female with history of coronary artery disease, type 2 diabetes mellitus, diabetic neuropathy, obesity, hyperlipidemia, vitamin D deficiency, COPD, asthma, GERD, venous stasis, lymphedema, gouty arthritis, osteoarthritis, and paroxysmal atrial fibrillation, who presents for follow-up related to HTN and HL.  Past Medical History    Past Medical History:  Diagnosis Date   Allergy    to cat, dogs, pollen, dust, cigarette smoke   Arthritis    feet   Cataract    removed both eyes   COPD (chronic obstructive pulmonary disease) with chronic bronchitis    And moderate persistent asthma; complicated by Restrictive Lung Disease from obesity   Coronary artery disease involving native coronary artery of native heart with other form of angina pectoris (HCC)    a. 04/2021 Cor CTA: Cor Ca2+ 389. Domninant RCA-RPDA<PLA. RCA >70p (abnl CTFFR), LAD 50-69p, LCX Min dz; b. 04/2021 Cath/PCI: LM nl, LAD mild diff dzs, D1/2/3 nl, LCX min irregs, OM1/2/3 nl, RCA 99p (2.75x30 Onyx Frontier DES).   Depression    Diastolic dysfunction    a. 04/2021 Echo: EF 60-65%, GrI DD. Nl RV fxn. Mildly dil LA.   Esophageal dysmotility 04/30/2022   Barium swallow 04/30/2022-also has impingement of tablet at aortic arch, we know from CT angiography that she has mild aortic enlargement and pulmonary outflow tract enlargement   GERD (gastroesophageal reflux disease)    Hx of adenomatous colonic polyps 05/30/2010   Hyperlipidemia    Hypertension    Lymphedema of both lower extremities    No superficial venous reflux noted.  However right-sided saphenofemoral junction reflux noted and left common femoral vein reflux noted.  No DVT or superficial thrombus.   Menopause    Migraines    Miscarriage    X 1   Moderate persistent  asthma    Obesity (BMI 30.0-34.9) 03/04/2013   PONV (postoperative nausea and vomiting)    Past Surgical History:  Procedure Laterality Date   CATARACT EXTRACTION, BILATERAL     CESAREAN SECTION     X 2   COLONOSCOPY     CORONARY STENT INTERVENTION N/A 04/30/2021   Procedure: CORONARY STENT INTERVENTION;  Surgeon: Kari Ouch, MD;  Location: [facility] INVASIVE CV LAB;  Service: Cardiovascular;  Proximal RCA 99% => DES PCI with Onyx Frontier 2.75 mm x 30 mm   DENTAL SURGERY  09/2016   EYE SURGERY     eye lid surgery 11/2021   LEFT HEART CATH AND CORONARY ANGIOGRAPHY Left 04/30/2021   Procedure: LEFT HEART CATH AND CORONARY ANGIOGRAPHY;  Surgeon: Kari Ouch, MD;  Location: [facility] INVASIVE CV LAB;  Service: Cardiovascular;; Proximal RCA 99% => DES PCI;  Ony; mild diffuse LAD and minimal diffuse LCx disease.  Moderately elevated EDP (22 mmHg).  EF 55-65 %.   POLYPECTOMY     TRANSTHORACIC ECHOCARDIOGRAM  11/04/2019   a) EF 55 to 60%.  Normal LV size and function.  GR 1 DD.  Unable to assess.  Mild LA dilation.  Normal aortic and mitral valve.  Normal RVP.  (Normal study);; b) 04/2021: Normal LV size and function.  EF 60 to 65%.  No RWMA.  GR 1 DD.Normal RV size and function.  Normal aortic and mitral valves.   TUBAL LIGATION  1980    Allergies  Allergies  Allergen  Reactions   Hydrochlorothiazide Other (See Comments)    Severe hyponatremia    Nifedipine     REACTION: swollen feet    History of Present Illness    77 year old female with above past medical history including CAD, paroxysmal atrial fibrillation, type 2 diabetes mellitus, diabetic neuropathy, obesity, hyperlipidemia, vitamin D deficiency, COPD, asthma, GERD, venous stasis, lymphedema, gouty arthritis, and osteoporosis.  In late 2022, she was seen in the [facility] ED with palpitations found to be in atrial fibrillation with rapid ventricular response, which responded to IV diltiazem and she was subsequently placed on oral  beta-blocker and Eliquis.  At follow-up, she reported chest pain and dyspnea that have occurred during her episode of A-fib, and she underwent coronary CT angiogram in December 2022 revealing severe proximal RCA disease.  She subsequently underwent diagnostic catheterization revealing 99% stenosis in the proximal RCA with otherwise minimal, nonobstructive CAD.  The RCA was successfully treated with a 2.75 x 30 mm Onyx frontier drug-eluting stent.  Postprocedure was complicated by right radial hematoma, which was managed with compression.  Follow-up echocardiogram in late December 2022, showed EF of 60 to 65% with grade 1 diastolic dysfunction.  Kari [name] was last seen in cardiology clinic in August 2023, at which time she was feeling well.  Her weight was down 30 pounds at that time.  In March of this year, she was seen in the emergency department due to COPD flare.  She had run out of her inhalers, and she was prescribed albuterol, prednisone taper, and doxycycline.  Since then, she has had improvement in breathing, but still has a nonproductive cough for which she has been using Delsym DM.  +, She has been noticing more frequent palpitations throughout the day though no sustained arrhythmias.  She does not experience presyncope or syncope.  She denies chest pain, dyspnea, PND, orthopnea, dizziness, syncope, edema, or early satiety.  Home Medications    Current Outpatient Medications  Medication Sig Dispense Refill   albuterol (VENTOLIN HFA) 108 (90 Base) MCG/ACT inhaler Inhale 1-2 puffs into the lungs every 6 (six) hours as needed. 18 g 0   allopurinol (ZYLOPRIM) 300 MG tablet Take 1 tablet (300 mg total) by mouth daily. 90 tablet 3   apixaban (ELIQUIS) 5 MG TABS tablet Take 1 tablet (5 mg total) by mouth 2 (two) times daily. 180 tablet 1   betamethasone dipropionate 0.05 % cream Apply topically 2 (two) times daily. 30 g 0   calcium carbonate (OS-CAL) 600 MG tablet Take 600 mg by mouth daily.      cetirizine (ZYRTEC) 10 MG tablet Take 10 mg by mouth daily.     Cinnamon 500 MG TABS Take 500 mg by mouth daily.     colchicine 0.6 MG tablet Take 1 tablet (0.6 mg total) by mouth 2 (two) times daily. As needed for gout flare. 60 tablet 2   losartan (COZAAR) 100 MG tablet Take 1 tablet (100 mg total) by mouth at bedtime. 90 tablet 1   Lutein 20 MG TABS Take 20 mg by mouth daily.     Magnesium 250 MG TABS Take 250 mg by mouth daily.     mirtazapine (REMERON) 45 MG tablet Take 1 tablet (45 mg total) by mouth at bedtime. 90 tablet 1   mometasone (NASONEX) 50 MCG/ACT nasal spray Place 2 sprays into the nose daily. 17 g 3   montelukast (SINGULAIR) 10 MG tablet Take 1 tablet (10 mg total) by mouth at bedtime. 90 tablet 3  Multiple Vitamins-Minerals (CENTRUM MINIS WOMEN 50+) TABS Take 1 tablet by mouth daily.     Omega-3 Fatty Acids (FISH OIL) 1000 MG CAPS Take 1,000 mg by mouth daily.     OVER THE COUNTER MEDICATION Place 2 sprays into both nostrils at bedtime. Nasocort nasal spray     pantoprazole (PROTONIX) 40 MG tablet Take 1 tablet (40 mg total) by mouth daily. 90 tablet 3   Probiotic Product (PROBIOTIC PO) Take 1 tablet by mouth daily.     traZODone (DESYREL) 50 MG tablet Take 1/2 to 1 tablet by mouth at bedtime as needed for sleep 90 tablet 0   triamcinolone lotion (KENALOG) 0.1 % Apply 1 application topically 2 (two) times daily as needed.     atorvastatin (LIPITOR) 40 MG tablet Take 1 tablet (40 mg total) by mouth daily. 90 tablet 3   benzonatate (TESSALON) 200 MG capsule Take 1 capsule (200 mg total) by mouth 3 (three) times daily as needed for cough. (Patient not taking: Reported on 02/11/2022) 60 capsule 1   carvedilol (COREG) 3.125 MG tablet Take 1 tablet (3.125 mg total) by mouth 2 (two) times daily. 28 tablet 0   empagliflozin (JARDIANCE) 10 MG TABS tablet Take 1 tablet (10 mg total) by mouth daily before breakfast. (Patient not taking: Reported on 04/18/2022) 30 tablet 2    fluticasone-salmeterol (ADVAIR DISKUS) 250-50 MCG/ACT AEPB TAKE 1 PUFF BY MOUTH TWICE A DAY (Patient not taking: Reported on 09/11/2022) 180 each 3   furosemide (LASIX) 20 MG tablet Take 1 tablet by mouth every other day (Patient not taking: Reported on 04/18/2022) 45 tablet 2   No current facility-administered medications for this visit.     Review of Systems    She has been having nonproductive cough ever since ED visit for COPD flare in March.  She has noted some increase in palpitations using over-the-counter cough medicine.  She denies chest pain, dyspnea, PND, orthopnea, dizziness, syncope, edema, or early satiety.  All other systems reviewed and are otherwise negative except as noted above.    Physical Exam    VS:  BP (!) 162/80 (BP Location: Left Arm, Patient Position: Sitting, Cuff Size: Normal)   Pulse (!) 58   Ht 5\' 4"  (1.626 m)   Wt 198 lb 12.8 oz (90.2 kg)   BMI 34.12 kg/m  , BMI Body mass index is 34.12 kg/m.     Vitals:   09/11/22 1532 09/11/22 1704  BP: (!) 162/80 (!) 178/84  Pulse: (!) 58     GEN: Well nourished, well developed, in no acute distress. HEENT: normal. Neck: Supple, no JVD, carotid bruits, or masses. Cardiac: RRR, no murmurs, rubs, or gallops. No clubbing, cyanosis, trace bilateral lower extremity edema.  Radials 2+/PT 2+ and equal bilaterally.  Respiratory:  Respirations regular and unlabored, clear to auscultation bilaterally. GI: Soft, nontender, nondistended, BS + x 4. MS: no deformity or atrophy. Skin: warm and dry, no rash. Neuro:  Strength and sensation are intact. Psych: Normal affect.  Accessory Clinical Findings    ECG personally reviewed by me today -sinus bradycardia, 58, delayed R wave progression- no acute changes.  Lab Results  Component Value Date   WBC 6.8 02/11/2022   HGB 12.5 02/11/2022   HCT 35.4 (L) 02/11/2022   MCV 90.6 02/11/2022   PLT 184.0 02/11/2022   Lab Results  Component Value Date   CREATININE 0.78 02/11/2022    BUN 18 02/11/2022   NA 136 02/11/2022   K 3.7 02/11/2022  CL 101 02/11/2022   CO2 26 02/11/2022   Lab Results  Component Value Date   ALT 31 02/11/2022   AST 32 02/11/2022   ALKPHOS 82 02/11/2022   BILITOT 0.7 02/11/2022   Lab Results  Component Value Date   CHOL 155 02/11/2022   HDL 44.10 02/11/2022   LDLCALC 79 02/11/2022   LDLDIRECT 88.0 09/19/2021   TRIG 160.0 (H) 02/11/2022   CHOLHDL 4 02/11/2022    Lab Results  Component Value Date   HGBA1C 5.4 02/11/2022    Assessment & Plan    1.  Coronary artery disease: Status post drug-eluting stent placement to the right coronary artery in late 2022.  She has since done well without chest pain and does not typically experience dyspnea on exertion.  She remains on beta-blocker, ARB, and statin therapy.  No aspirin in the setting of Eliquis.  In the setting of hypertension, switching metoprolol to carvedilol.  2.  Paroxysmal atrial fibrillation: She has noticed an increased frequency of palpitations over the past few weeks in the setting of over-the-counter cough medicine.  Symptoms typically last a few seconds but could last up to a minute.  No sustained arrhythmias.  She is in sinus rhythm today.  Switching metoprolol to carvedilol for the purposes of hypertension.  She remains anticoagulated on Eliquis.  We did discuss possibly pursuing event monitoring if symptoms of palpitations persist despite coming off of over-the-counter cold medicine and agreed to defer for now.  3.  Palpitations: See #2.  4.  Primary hypertension: Blood pressure elevated today on 2 consecutive recordings.  She does not routinely check this at home.  Unclear what role over-the-counter cough medicine might be playing but even prior to that, when she was checking at home, she was often in the 140 range.  As she wishes to avoid additional medication today, I am switching metoprolol to carvedilol.  Discussed other medication options for ongoing hypertension as  titration of beta-blocker therapy will be limited by baseline bradycardia.  She does have trace edema on exam and a history of chronic lymphedema and therefore amlodipine would not likely be a good option in the future.  Spironolactone is likely a very good option if necessary.  5.  Hyperlipidemia: LDL of 79 in October with normal LFTs.  I am increasing her atorvastatin to 40 mg daily with follow-up lipids and LFTs in approximately 6 weeks.  6.  Type 2 diabetes mellitus: A1c 5.4 in October.  She was prescribed Jardiance previously however, she was unable to afford this.  7.  Disposition: Follow-up lipids and LFTs in approximately 6 to 8 weeks.  She has follow-up with primary care later this month and we will arrange for office follow-up here in 3 months or sooner if necessary.   Nicolasa Ducking, NP 09/11/2022, 4:45 PM

## 2022-09-11 NOTE — Patient Instructions (Signed)
Medication Instructions:  Your physician recommends the following medication changes.  STOP TAKING: Metoprolol Tartrate  START TAKING: Carvedilol 3.125 mg twice daily  INCREASE: Atorvastatin 40 mg once daily  *If you need a refill on your cardiac medications before your next appointment, please call your pharmacy*   Lab Work: None ordered  If you have labs (blood work) drawn today and your tests are completely normal, you will receive your results only by: MyChart Message (if you have MyChart) OR A paper copy in the mail If you have any lab test that is abnormal or we need to change your treatment, we will call you to review the results.   Testing/Procedures: None ordered   Follow-Up: At [facility], you and your health needs are our priority.  As part of our continuing mission to provide you with exceptional heart care, we have created designated Provider Care Teams.  These Care Teams include your primary Cardiologist (physician) and Advanced Practice Providers (APPs -  Physician Assistants and Nurse Practitioners) who all work together to provide you with the care you need, when you need it.  We recommend signing up for the patient portal called "MyChart".  Sign up information is provided on this After Visit Summary.  MyChart is used to connect with patients for Virtual Visits (Telemedicine).  Patients are able to view lab/test results, encounter notes, upcoming appointments, etc.  Non-urgent messages can be sent to your provider as well.   To learn more about what you can do with MyChart, go to [email or web address].    Your next appointment:   3 month(s)  Provider:   You may see Bryan Lemma, MD  or one of the following Advanced Practice Providers on your designated Care Team:   Nicolasa Ducking, NP Eula Listen, PA-C Cadence Fransico Michael, PA-C Charlsie Quest, NP

## 2022-10-01 ENCOUNTER — Telehealth: Payer: Self-pay | Admitting: Cardiology

## 2022-10-01 NOTE — Telephone Encounter (Signed)
Per chart review, on 09/11/22, metoprolol was stopped and changed to carvedilol 3.125 mg BID.  [facility] made aware and verbalized understanding.

## 2022-10-01 NOTE — Telephone Encounter (Signed)
Pt c/o medication issue:  1. Name of Medication:   carvedilol (COREG) 3.125 MG tablet   2. How are you currently taking this medication (dosage and times per day)?   3. Are you having a reaction (difficulty breathing--STAT)?   4. What is your medication issue?   Caller stated they will need clarification on this medication as it may be duplicate to metoprolol tartrate (LOPRESSOR) tablet 25 mg.  Caller stated they will need call back to confirm can use Ref# [id number].  Caller noted patient requested 2-day express delivery.

## 2022-10-02 ENCOUNTER — Other Ambulatory Visit: Payer: Self-pay | Admitting: Internal Medicine

## 2022-10-08 ENCOUNTER — Encounter: Payer: Self-pay | Admitting: Internal Medicine

## 2022-10-08 ENCOUNTER — Ambulatory Visit (INDEPENDENT_AMBULATORY_CARE_PROVIDER_SITE_OTHER): Payer: PPO | Admitting: Internal Medicine

## 2022-10-08 VITALS — BP 154/66 | HR 68 | Temp 98.0°F | Ht 64.0 in | Wt 196.4 lb

## 2022-10-08 DIAGNOSIS — D6869 Other thrombophilia: Secondary | ICD-10-CM | POA: Diagnosis not present

## 2022-10-08 DIAGNOSIS — F325 Major depressive disorder, single episode, in full remission: Secondary | ICD-10-CM

## 2022-10-08 DIAGNOSIS — Z79899 Other long term (current) drug therapy: Secondary | ICD-10-CM | POA: Diagnosis not present

## 2022-10-08 DIAGNOSIS — E785 Hyperlipidemia, unspecified: Secondary | ICD-10-CM

## 2022-10-08 DIAGNOSIS — E1169 Type 2 diabetes mellitus with other specified complication: Secondary | ICD-10-CM | POA: Diagnosis not present

## 2022-10-08 DIAGNOSIS — R1319 Other dysphagia: Secondary | ICD-10-CM | POA: Diagnosis not present

## 2022-10-08 DIAGNOSIS — M109 Gout, unspecified: Secondary | ICD-10-CM | POA: Diagnosis not present

## 2022-10-08 DIAGNOSIS — I10 Essential (primary) hypertension: Secondary | ICD-10-CM

## 2022-10-08 DIAGNOSIS — E1140 Type 2 diabetes mellitus with diabetic neuropathy, unspecified: Secondary | ICD-10-CM | POA: Diagnosis not present

## 2022-10-08 DIAGNOSIS — D649 Anemia, unspecified: Secondary | ICD-10-CM | POA: Diagnosis not present

## 2022-10-08 MED ORDER — TRAZODONE HCL 50 MG PO TABS: 25.0000 mg | ORAL_TABLET | Freq: Every evening | ORAL | 0 refills | Status: DC | PRN

## 2022-10-08 MED ORDER — BETAMETHASONE DIPROPIONATE 0.05 % EX CREA: TOPICAL_CREAM | Freq: Two times a day (BID) | CUTANEOUS | 0 refills | Status: DC

## 2022-10-08 MED ORDER — MONTELUKAST SODIUM 10 MG PO TABS: 10.0000 mg | ORAL_TABLET | Freq: Every day | ORAL | 3 refills | Status: DC

## 2022-10-08 MED ORDER — FLUTICASONE-SALMETEROL 250-50 MCG/ACT IN AEPB: INHALATION_SPRAY | RESPIRATORY_TRACT | 3 refills | Status: DC

## 2022-10-08 MED ORDER — ALLOPURINOL 300 MG PO TABS: 300.0000 mg | ORAL_TABLET | Freq: Every day | ORAL | 3 refills | Status: DC

## 2022-10-08 MED ORDER — ALBUTEROL SULFATE HFA 108 (90 BASE) MCG/ACT IN AERS: 2.0000 | INHALATION_SPRAY | Freq: Four times a day (QID) | RESPIRATORY_TRACT | Status: DC | PRN

## 2022-10-08 NOTE — Progress Notes (Unsigned)
Subjective:  Patient ID: Kari [name], female    DOB: [birth year]  Age: 77 y.o. MRN: [id number]  CC: The primary encounter diagnosis was Essential hypertension. Diagnoses of Gouty arthritis of toe of right foot, Hyperlipidemia associated with type 2 diabetes mellitus (HCC), Type 2 diabetes mellitus with diabetic neuropathy, without long-term current use of insulin (HCC), Anemia, unspecified type, and Long-term use of high-risk medication were also pertinent to this visit.   HPI Kari [name] presents for follow up on type 2 DM and hypertension  Chief Complaint  Patient presents with   Medical Management of Chronic Issues   1) HTN;  Hypertension: patient checks blood pressure twice daily  at home.  Readings have been for the most part > 140/80 at rest . Patient is following a reduce salt diet most days .  Her cardiologist recently changed her metoprolol to carvedilol 3.125 mg bid  which she started it a few days ago.  and losartan 100 mg as prescribed   snores but no prior sleep study     2) h/o Type 2 DM :  not checking  sugars.   Exercising regularly ,    Lab Results  Component Value Date   HGBA1C 5.4 02/11/2022      Outpatient Medications Prior to Visit  Medication Sig Dispense Refill   albuterol (VENTOLIN HFA) 108 (90 Base) MCG/ACT inhaler Inhale 2 puffs into the lungs every 6 hours as needed for wheezing or shortness of breath 8.5 g 0   allopurinol (ZYLOPRIM) 300 MG tablet Take 1 tablet (300 mg total) by mouth daily. 90 tablet 3   apixaban (ELIQUIS) 5 MG TABS tablet Take 1 tablet (5 mg total) by mouth 2 (two) times daily. 180 tablet 1   atorvastatin (LIPITOR) 40 MG tablet Take 1 tablet (40 mg total) by mouth daily. 90 tablet 3   benzonatate (TESSALON) 200 MG capsule Take 1 capsule (200 mg total) by mouth 3 (three) times daily as needed for cough. (Patient not taking: Reported on 02/11/2022) 60 capsule 1   betamethasone dipropionate 0.05 % cream Apply topically 2 (two) times daily.  30 g 0   calcium carbonate (OS-CAL) 600 MG tablet Take 600 mg by mouth daily.     carvedilol (COREG) 3.125 MG tablet Take 1 tablet (3.125 mg total) by mouth 2 (two) times daily. 28 tablet 0   cetirizine (ZYRTEC) 10 MG tablet Take 10 mg by mouth daily.     Cinnamon 500 MG TABS Take 500 mg by mouth daily.     colchicine 0.6 MG tablet Take 1 tablet (0.6 mg total) by mouth 2 (two) times daily. As needed for gout flare. 60 tablet 2   empagliflozin (JARDIANCE) 10 MG TABS tablet Take 1 tablet (10 mg total) by mouth daily before breakfast. (Patient not taking: Reported on 04/18/2022) 30 tablet 2   fluticasone-salmeterol (ADVAIR DISKUS) 250-50 MCG/ACT AEPB TAKE 1 PUFF BY MOUTH TWICE A DAY (Patient not taking: Reported on 09/11/2022) 180 each 3   furosemide (LASIX) 20 MG tablet Take 1 tablet by mouth every other day (Patient not taking: Reported on 04/18/2022) 45 tablet 2   losartan (COZAAR) 100 MG tablet Take 1 tablet (100 mg total) by mouth at bedtime. 90 tablet 1   Lutein 20 MG TABS Take 20 mg by mouth daily.     Magnesium 250 MG TABS Take 250 mg by mouth daily.     mirtazapine (REMERON) 45 MG tablet Take 1 tablet (45 mg total) by mouth at  bedtime. 90 tablet 1   mometasone (NASONEX) 50 MCG/ACT nasal spray Place 2 sprays into the nose daily. 17 g 3   montelukast (SINGULAIR) 10 MG tablet Take 1 tablet (10 mg total) by mouth at bedtime. 90 tablet 3   Multiple Vitamins-Minerals (CENTRUM MINIS WOMEN 50+) TABS Take 1 tablet by mouth daily.     Omega-3 Fatty Acids (FISH OIL) 1000 MG CAPS Take 1,000 mg by mouth daily.     OVER THE COUNTER MEDICATION Place 2 sprays into both nostrils at bedtime. Nasocort nasal spray     pantoprazole (PROTONIX) 40 MG tablet Take 1 tablet (40 mg total) by mouth daily. 90 tablet 3   Probiotic Product (PROBIOTIC PO) Take 1 tablet by mouth daily.     traZODone (DESYREL) 50 MG tablet Take 1/2 to 1 tablet by mouth at bedtime as needed for sleep 90 tablet 0   triamcinolone lotion (KENALOG)  0.1 % Apply 1 application topically 2 (two) times daily as needed.     No facility-administered medications prior to visit.    Review of Systems;  Patient denies headache, fevers, malaise, unintentional weight loss, skin rash, eye pain, sinus congestion and sinus pain, sore throat, dysphagia,  hemoptysis , cough, dyspnea, wheezing, chest pain, palpitations, orthopnea, edema, abdominal pain, nausea, melena, diarrhea, constipation, flank pain, dysuria, hematuria, urinary  Frequency, nocturia, numbness, tingling, seizures,  Focal weakness, Loss of consciousness,  Tremor, insomnia, depression, anxiety, and suicidal ideation.      Objective:  There were no vitals taken for this visit.  BP Readings from Last 3 Encounters:  09/11/22 (!) 178/84  08/08/22 (!) 153/79  04/18/22 126/72    Wt Readings from Last 3 Encounters:  09/11/22 198 lb 12.8 oz (90.2 kg)  09/05/22 195 lb (88.5 kg)  08/08/22 195 lb (88.5 kg)    Physical Exam  Lab Results  Component Value Date   HGBA1C 5.4 02/11/2022   HGBA1C 5.4 09/19/2021   HGBA1C 5.8 04/16/2021    Lab Results  Component Value Date   CREATININE 0.78 02/11/2022   CREATININE 0.72 10/16/2021   CREATININE 0.74 09/19/2021    Lab Results  Component Value Date   WBC 6.8 02/11/2022   HGB 12.5 02/11/2022   HCT 35.4 (L) 02/11/2022   PLT 184.0 02/11/2022   GLUCOSE 109 (H) 02/11/2022   CHOL 155 02/11/2022   TRIG 160.0 (H) 02/11/2022   HDL 44.10 02/11/2022   LDLDIRECT 88.0 09/19/2021   LDLCALC 79 02/11/2022   ALT 31 02/11/2022   AST 32 02/11/2022   NA 136 02/11/2022   K 3.7 02/11/2022   CL 101 02/11/2022   CREATININE 0.78 02/11/2022   BUN 18 02/11/2022   CO2 26 02/11/2022   TSH 1.72 09/19/2021   HGBA1C 5.4 02/11/2022   MICROALBUR 0.7 02/11/2022    No results found.  Assessment & Plan:  .Essential hypertension  Gouty arthritis of toe of right foot  Hyperlipidemia associated with type 2 diabetes mellitus (HCC)  Type 2 diabetes  mellitus with diabetic neuropathy, without long-term current use of insulin (HCC)  Anemia, unspecified type  Long-term use of high-risk medication     I provided 30 minutes of face-to-face time during this encounter reviewing patient's last visit with me, patient's  most recent visit with cardiology,  nephrology,  and neurology,  recent surgical and non surgical procedures, previous  labs and imaging studies, counseling on currently addressed issues,  and post visit ordering to diagnostics and therapeutics .   Follow-up: No follow-ups  on file.   Sherlene Shams, MD

## 2022-10-08 NOTE — Patient Instructions (Addendum)
If your BP is not 130/80 by Thursday,  increase the carvedilol to 6.25 mg twice daily    Ask Rin if you  stop  breathing or if your " snort" when you snore   Try using Kari [name] instead of salt    You are due for your tetanus-diptheria-pertussis vaccine   (TDaP)   You can  get It for free at your pharmacy

## 2022-10-09 LAB — CBC WITH DIFFERENTIAL/PLATELET
Basophils Absolute: 0.1 10*3/uL (ref 0.0–0.1)
Basophils Relative: 1.2 % (ref 0.0–3.0)
Eosinophils Absolute: 0.4 10*3/uL (ref 0.0–0.7)
Eosinophils Relative: 5 % (ref 0.0–5.0)
HCT: 35.6 % — ABNORMAL LOW (ref 36.0–46.0)
Hemoglobin: 12 g/dL (ref 12.0–15.0)
Lymphocytes Relative: 25.5 % (ref 12.0–46.0)
Lymphs Abs: 2.3 10*3/uL (ref 0.7–4.0)
MCHC: 33.7 g/dL (ref 30.0–36.0)
MCV: 92.4 fl (ref 78.0–100.0)
Monocytes Absolute: 0.7 10*3/uL (ref 0.1–1.0)
Monocytes Relative: 8 % (ref 3.0–12.0)
Neutro Abs: 5.4 10*3/uL (ref 1.4–7.7)
Neutrophils Relative %: 60.3 % (ref 43.0–77.0)
Platelets: 223 10*3/uL (ref 150.0–400.0)
RBC: 3.85 Mil/uL — ABNORMAL LOW (ref 3.87–5.11)
RDW: 15.1 % (ref 11.5–15.5)
WBC: 8.9 10*3/uL (ref 4.0–10.5)

## 2022-10-09 LAB — COMPREHENSIVE METABOLIC PANEL
ALT: 18 U/L (ref 0–35)
AST: 26 U/L (ref 0–37)
Albumin: 3.7 g/dL (ref 3.5–5.2)
Alkaline Phosphatase: 95 U/L (ref 39–117)
BUN: 12 mg/dL (ref 6–23)
CO2: 30 mEq/L (ref 19–32)
Calcium: 9.3 mg/dL (ref 8.4–10.5)
Chloride: 99 mEq/L (ref 96–112)
Creatinine, Ser: 0.75 mg/dL (ref 0.40–1.20)
GFR: 78.05 mL/min (ref >60.00)
Glucose, Bld: 91 mg/dL (ref 70–99)
Potassium: 4.4 mEq/L (ref 3.5–5.1)
Sodium: 135 mEq/L (ref 135–145)
Total Bilirubin: 0.7 mg/dL (ref 0.2–1.2)
Total Protein: 7.6 g/dL (ref 6.0–8.3)

## 2022-10-09 LAB — LIPID PANEL
Cholesterol: 127 mg/dL (ref 0–200)
HDL: 40.8 mg/dL (ref >39.00)
NonHDL: 86.12
Total CHOL/HDL Ratio: 3
Triglycerides: 201 mg/dL — ABNORMAL HIGH (ref 0.0–149.0)
VLDL: 40.2 mg/dL — ABNORMAL HIGH (ref 0.0–40.0)

## 2022-10-09 LAB — LDL CHOLESTEROL, DIRECT: Direct LDL: 59 mg/dL

## 2022-10-09 LAB — HEMOGLOBIN A1C: Hgb A1c MFr Bld: 5.4 % (ref 4.6–6.5)

## 2022-10-09 LAB — TSH: TSH: 1.57 u[IU]/mL (ref 0.35–5.50)

## 2022-10-09 NOTE — Assessment & Plan Note (Signed)
Her symptoms remain controlled with remeron  45 mg daily .  No changes today.

## 2022-10-09 NOTE — Assessment & Plan Note (Signed)
Not at goal.  Recent medication change from metoprolol to carvedilol done within last 24 hours.  Advised to increase doseto 6.25 mg bid after one week if bp is not 130/80 or less . Screening for secondary causes may be necessary

## 2022-10-09 NOTE — Assessment & Plan Note (Signed)
Resolved by current A1c,  without medications. et alone . Marland Kitchen Patient is up-to-date on eye exams and foot exam is normal despite reports of  mild numbness without skin changes  today. Patient has no microalbuminuria. Patient is tolerating statin therapy for CAD risk reduction and on ACE/ARB for renal protection and hypertension .    Lab Results  Component Value Date   HGBA1C 5.4 10/08/2022   Lab Results  Component Value Date   MICROALBUR 0.7 02/11/2022   MICROALBUR <0.7 04/16/2021

## 2022-10-09 NOTE — Assessment & Plan Note (Signed)
Acquired,  Secondary to PAF.  Risk mitigated with Eliquis  

## 2022-10-09 NOTE — Assessment & Plan Note (Addendum)
Multifactorial , with disordered contractions of esophagus and extrinsic compression of thoracic arteries on lower esophagus (per Dr Leone Payor , DG esophagus done)

## 2022-10-09 NOTE — Assessment & Plan Note (Signed)
   Lab Results  Component Value Date   WBC 8.9 10/08/2022   HGB 12.0 10/08/2022   HCT 35.6 (L) 10/08/2022   MCV 92.4 10/08/2022   PLT 223.0 10/08/2022

## 2022-10-12 ENCOUNTER — Encounter: Payer: Self-pay | Admitting: Internal Medicine

## 2022-10-14 MED ORDER — CARVEDILOL 6.25 MG PO TABS: 6.2500 mg | ORAL_TABLET | Freq: Two times a day (BID) | ORAL | 0 refills | Status: DC

## 2022-10-14 MED ORDER — BETAMETHASONE VALERATE 0.1 % EX OINT: 1.0000 | TOPICAL_OINTMENT | Freq: Two times a day (BID) | CUTANEOUS | 0 refills | Status: DC

## 2022-10-27 DIAGNOSIS — Z23 Encounter for immunization: Secondary | ICD-10-CM | POA: Insufficient documentation

## 2022-10-27 DIAGNOSIS — K5730 Diverticulosis of large intestine without perforation or abscess without bleeding: Secondary | ICD-10-CM | POA: Diagnosis not present

## 2022-10-27 DIAGNOSIS — K7689 Other specified diseases of liver: Secondary | ICD-10-CM | POA: Diagnosis not present

## 2022-10-27 DIAGNOSIS — W010XXA Fall on same level from slipping, tripping and stumbling without subsequent striking against object, initial encounter: Secondary | ICD-10-CM | POA: Diagnosis not present

## 2022-10-27 DIAGNOSIS — S80212A Abrasion, left knee, initial encounter: Secondary | ICD-10-CM | POA: Diagnosis not present

## 2022-10-27 DIAGNOSIS — K429 Umbilical hernia without obstruction or gangrene: Secondary | ICD-10-CM | POA: Insufficient documentation

## 2022-10-27 DIAGNOSIS — R0789 Other chest pain: Secondary | ICD-10-CM | POA: Diagnosis not present

## 2022-10-27 DIAGNOSIS — I700 Atherosclerosis of aorta: Secondary | ICD-10-CM | POA: Insufficient documentation

## 2022-10-27 DIAGNOSIS — M25562 Pain in left knee: Secondary | ICD-10-CM | POA: Diagnosis not present

## 2022-10-27 DIAGNOSIS — Z7901 Long term (current) use of anticoagulants: Secondary | ICD-10-CM | POA: Insufficient documentation

## 2022-10-27 DIAGNOSIS — S0990XA Unspecified injury of head, initial encounter: Secondary | ICD-10-CM | POA: Insufficient documentation

## 2022-10-27 DIAGNOSIS — Y92007 Garden or yard of unspecified non-institutional (private) residence as the place of occurrence of the external cause: Secondary | ICD-10-CM | POA: Insufficient documentation

## 2022-10-27 DIAGNOSIS — S20212A Contusion of left front wall of thorax, initial encounter: Secondary | ICD-10-CM | POA: Insufficient documentation

## 2022-10-27 DIAGNOSIS — R0781 Pleurodynia: Secondary | ICD-10-CM | POA: Diagnosis not present

## 2022-10-27 DIAGNOSIS — I2510 Atherosclerotic heart disease of native coronary artery without angina pectoris: Secondary | ICD-10-CM | POA: Insufficient documentation

## 2022-10-27 DIAGNOSIS — S8002XA Contusion of left knee, initial encounter: Secondary | ICD-10-CM | POA: Diagnosis not present

## 2022-10-28 ENCOUNTER — Emergency Department
Admission: EM | Admit: 2022-10-28 | Discharge: 2022-10-28 | Disposition: A | Discharge status: Home / Self Care | Payer: PPO | Attending: Emergency Medicine | Admitting: Emergency Medicine

## 2022-10-28 ENCOUNTER — Emergency Department: Payer: PPO

## 2022-10-28 DIAGNOSIS — S80212A Abrasion, left knee, initial encounter: Secondary | ICD-10-CM

## 2022-10-28 DIAGNOSIS — S20212A Contusion of left front wall of thorax, initial encounter: Secondary | ICD-10-CM

## 2022-10-28 DIAGNOSIS — R0789 Other chest pain: Secondary | ICD-10-CM | POA: Diagnosis not present

## 2022-10-28 DIAGNOSIS — K5730 Diverticulosis of large intestine without perforation or abscess without bleeding: Secondary | ICD-10-CM | POA: Diagnosis not present

## 2022-10-28 DIAGNOSIS — S0990XA Unspecified injury of head, initial encounter: Secondary | ICD-10-CM | POA: Diagnosis not present

## 2022-10-28 DIAGNOSIS — R0781 Pleurodynia: Secondary | ICD-10-CM | POA: Diagnosis not present

## 2022-10-28 DIAGNOSIS — W19XXXA Unspecified fall, initial encounter: Secondary | ICD-10-CM

## 2022-10-28 DIAGNOSIS — M25562 Pain in left knee: Secondary | ICD-10-CM | POA: Diagnosis not present

## 2022-10-28 DIAGNOSIS — K7689 Other specified diseases of liver: Secondary | ICD-10-CM | POA: Diagnosis not present

## 2022-10-28 LAB — CBC WITH DIFFERENTIAL/PLATELET
Abs Immature Granulocytes: 0.04 10*3/uL (ref 0.00–0.07)
Basophils Absolute: 0.1 10*3/uL (ref 0.0–0.1)
Basophils Relative: 1 %
Eosinophils Absolute: 0.4 10*3/uL (ref 0.0–0.5)
Eosinophils Relative: 4 %
HCT: 33.6 % — ABNORMAL LOW (ref 36.0–46.0)
Hemoglobin: 11.4 g/dL — ABNORMAL LOW (ref 12.0–15.0)
Immature Granulocytes: 0 %
Lymphocytes Relative: 20 %
Lymphs Abs: 2 10*3/uL (ref 0.7–4.0)
MCH: 31.7 pg (ref 26.0–34.0)
MCHC: 33.9 g/dL (ref 30.0–36.0)
MCV: 93.3 fL (ref 80.0–100.0)
Monocytes Absolute: 0.8 10*3/uL (ref 0.1–1.0)
Monocytes Relative: 8 %
Neutro Abs: 6.8 10*3/uL (ref 1.7–7.7)
Neutrophils Relative %: 67 %
Platelets: 192 10*3/uL (ref 150–400)
RBC: 3.6 MIL/uL — ABNORMAL LOW (ref 3.87–5.11)
RDW: 14.1 % (ref 11.5–15.5)
WBC: 10.1 10*3/uL (ref 4.0–10.5)
nRBC: 0 % (ref 0.0–0.2)

## 2022-10-28 LAB — COMPREHENSIVE METABOLIC PANEL
ALT: 23 U/L (ref 0–44)
AST: 33 U/L (ref 15–41)
Albumin: 3.5 g/dL (ref 3.5–5.0)
Alkaline Phosphatase: 94 U/L (ref 38–126)
Anion gap: 10 (ref 5–15)
BUN: 15 mg/dL (ref 8–23)
CO2: 22 mmol/L (ref 22–32)
Calcium: 8.9 mg/dL (ref 8.9–10.3)
Chloride: 101 mmol/L (ref 98–111)
Creatinine, Ser: 0.83 mg/dL (ref 0.44–1.00)
GFR, Estimated: >60 mL/min (ref >60)
Glucose, Bld: 146 mg/dL — ABNORMAL HIGH (ref 70–99)
Potassium: 3.5 mmol/L (ref 3.5–5.1)
Sodium: 133 mmol/L — ABNORMAL LOW (ref 135–145)
Total Bilirubin: 0.6 mg/dL (ref 0.3–1.2)
Total Protein: 7.1 g/dL (ref 6.5–8.1)

## 2022-10-28 LAB — LIPASE, BLOOD: Lipase: 41 U/L (ref 11–51)

## 2022-10-28 LAB — PROTIME-INR
INR: 1.2 (ref 0.8–1.2)
Prothrombin Time: 15.7 seconds — ABNORMAL HIGH (ref 11.4–15.2)

## 2022-10-28 MED ORDER — OXYCODONE-ACETAMINOPHEN 5-325 MG PO TABS: 1.0000 | ORAL_TABLET | Freq: Four times a day (QID) | ORAL | 0 refills | Status: AC | PRN

## 2022-10-28 MED ORDER — IOHEXOL 300 MG/ML  SOLN
100.0000 mL | Freq: Once | INTRAMUSCULAR | Status: AC | PRN
  Administered 2022-10-28: 100 mL via INTRAVENOUS

## 2022-10-28 MED ORDER — ONDANSETRON 4 MG PO TBDP: 4.0000 mg | ORAL_TABLET | Freq: Three times a day (TID) | ORAL | 0 refills | Status: DC | PRN

## 2022-10-28 MED ORDER — TETANUS-DIPHTH-ACELL PERTUSSIS 5-2.5-18.5 LF-MCG/0.5 IM SUSY
0.5000 mL | PREFILLED_SYRINGE | Freq: Once | INTRAMUSCULAR | Status: AC
  Administered 2022-10-28: 0.5 mL via INTRAMUSCULAR
  Filled 2022-10-28: qty 0.5

## 2022-10-28 MED ORDER — FENTANYL CITRATE PF 50 MCG/ML IJ SOSY
50.0000 ug | PREFILLED_SYRINGE | Freq: Once | INTRAMUSCULAR | Status: AC
  Administered 2022-10-28: 50 ug via INTRAVENOUS
  Filled 2022-10-28: qty 1

## 2022-10-28 NOTE — ED Triage Notes (Signed)
Pt states that she has a mechanical fall this afternoon in the garden onto her L ribs, reports bruising and hurts to take a deep breath, hit head, no LOC, is on Eliquis, also having L knee pain. Also having some nausea

## 2022-10-28 NOTE — ED Notes (Signed)
Educated on use of incentive spirometer

## 2022-10-28 NOTE — ED Provider Notes (Signed)
[facility] Provider Note    Event Date/Time   First MD Initiated Contact with Patient 10/28/22 0022     (approximate)   History   Fall   HPI  Kari [name] is a 77 y.o. female past medical history significant for atrial fibrillation on Eliquis who presents to the emergency department following a fall.  Patient states that she was working outside in her garden, wearing flip-flops that were wet and slipped and fell.  No loss of consciousness.  States that she landed on her left knee.  Also hit her left chest wall.  Pain that is worse with deep inspiration and noted a large hematoma to the left chest wall.  Does endorse head injury but no loss of consciousness.  No neck pain.  No significant back pain.  Has not taken anything for pain.  Ambulatory after the fall.  Not up-to-date on tetanus.     Physical Exam   Triage Vital Signs: ED Triage Vitals  Enc Vitals Group     BP 10/27/22 2353 (!) 185/71     Pulse Rate 10/27/22 2353 72     Resp 10/27/22 2353 20     Temp 10/27/22 2353 98.8 F (37.1 C)     Temp Source 10/27/22 2353 Oral     SpO2 10/27/22 2353 96 %     Weight 10/27/22 2352 195 lb (88.5 kg)     Height 10/27/22 2352 5\' 4"  (1.626 m)     Head Circumference --      Peak Flow --      Pain Score 10/28/22 0002 4     Pain Loc --      Pain Edu? --      Excl. in GC? --     Most recent vital signs: Vitals:   10/27/22 2353  BP: (!) 185/71  Pulse: 72  Resp: 20  Temp: 98.8 F (37.1 C)  SpO2: 96%    Physical Exam HENT:     Head: Atraumatic.     Right Ear: External ear normal.     Left Ear: External ear normal.  Eyes:     Extraocular Movements: Extraocular movements intact.     Pupils: Pupils are equal, round, and reactive to light.  Cardiovascular:     Rate and Rhythm: Normal rate.  Pulmonary:     Effort: No respiratory distress.     Comments: Large hematoma to the left lateral chest wall Abdominal:     General: There is no distension.   Musculoskeletal:        General: No tenderness. Normal range of motion.     Cervical back: No tenderness.     Comments: Abrasion to the left knee.  Full range of motion to bilateral hips, knees and ankles.  No underlying tenderness to palpation to hips, upper or lower legs.  +2 DP pulses.  No tenderness to bilateral upper extremities.  No tenderness to palpation to cervical, thoracic or lumbar spine  Neurological:     General: No focal deficit present.     Mental Status: She is alert.     IMPRESSION / MDM / ASSESSMENT AND PLAN / ED COURSE  I reviewed the triage vital signs and the nursing notes.  On chart review patient is on Eliquis for atrial fibrillation  Nonsyncopal fall.  Given her hematoma to the left chest wall will obtain CT scan chest.  And abdomen pelvis.    RADIOLOGY I independently reviewed imaging, my interpretation of imaging:  X-ray of the left knee and left lateral chest wall with no acute fracture or dislocation.  CT scan of the head with no signs of acute intracranial hemorrhage or infarction.  Read as right posterior parietal occipital scalp swelling.  CT chest abdomen and pelvis with contrast -with some soft tissue stranding to the left chest wall.  No obvious expanding hematoma.  No obvious active extravasation.  CT scan chest abdomen and pelvis was read as no acute findings.  LABS (all labs ordered are listed, but only abnormal results are displayed) Labs interpreted as -    Labs Reviewed  CBC WITH DIFFERENTIAL/PLATELET - Abnormal; Notable for the following components:      Result Value   RBC 3.60 (*)    Hemoglobin 11.4 (*)    HCT 33.6 (*)    All other components within normal limits  COMPREHENSIVE METABOLIC PANEL - Abnormal; Notable for the following components:   Sodium 133 (*)    Glucose, Bld 146 (*)    All other components within normal limits  PROTIME-INR - Abnormal; Notable for the following components:   Prothrombin Time 15.7 (*)    All other  components within normal limits  LIPASE, BLOOD     MDM  Given IV fentanyl for pain control.  Will obtain lab work to evaluate for anemia given her large hematoma to the left chest wall on Eliquis  No significant drop of hemoglobin.  Creatinine at baseline.  No significant elevation of LFTs.  On CT imaging no acute fractures or signs of an hematoma that is ongoing bleeding.  Was read as no acute findings.  Patient was provided with an incentive spirometer.  No obvious lung contusions.  Given a couple days of pain medication I discussed scheduling Tylenol.  Discussed ice and follow-up with primary care physician.  Given return precautions for any worsening symptoms.     PROCEDURES:  Critical Care performed: No  Procedures  Patient's presentation is most consistent with acute presentation with potential threat to life or bodily function.   MEDICATIONS ORDERED IN ED: Medications  fentaNYL (SUBLIMAZE) injection 50 mcg (50 mcg Intravenous Given 10/28/22 0144)  Tdap (BOOSTRIX) injection 0.5 mL (0.5 mLs Intramuscular Given 10/28/22 0144)  iohexol (OMNIPAQUE) 300 MG/ML solution 100 mL (100 mLs Intravenous Contrast Given 10/28/22 0215)    FINAL CLINICAL IMPRESSION(S) / ED DIAGNOSES   Final diagnoses:  Fall, initial encounter  Hematoma of left chest wall, initial encounter  Abrasion of left knee, initial encounter     Rx / DC Orders   ED Discharge Orders          Ordered    oxyCODONE-acetaminophen (PERCOCET) 5-325 MG tablet  Every 6 hours PRN        10/28/22 0315    ondansetron (ZOFRAN-ODT) 4 MG disintegrating tablet  Every 8 hours PRN        10/28/22 0315             Note:  This document was prepared using Dragon voice recognition software and may include unintentional dictation errors.   Corena Herter, MD 10/28/22 [phone]

## 2022-10-28 NOTE — Discharge Instructions (Addendum)
Pain control:  Acetaminophen (tylenol) - You can take 2 extra strength tablets (1000 mg) every 6 hours as needed for pain/fever.  Make sure you eat food/drink water when taking these medications.  You were given a prescription for narcotic pain medications.  Take only if in severe pain.  These are very addictive medications.  These medications can make you constipated.  If you need to take more than 1-2 doses, start a stool softner.  If you become constipated, take 1 capfull of MiraLAX, can repeat untill having regular bowel movements.  Keep this medication out of reach of any children.  

## 2022-11-07 ENCOUNTER — Encounter: Payer: Self-pay | Admitting: Internal Medicine

## 2022-11-07 ENCOUNTER — Ambulatory Visit (INDEPENDENT_AMBULATORY_CARE_PROVIDER_SITE_OTHER): Payer: PPO | Admitting: Internal Medicine

## 2022-11-07 ENCOUNTER — Ambulatory Visit (INDEPENDENT_AMBULATORY_CARE_PROVIDER_SITE_OTHER): Payer: PPO

## 2022-11-07 VITALS — BP 130/68 | HR 70 | Temp 98.1°F | Ht 64.0 in | Wt 198.0 lb

## 2022-11-07 DIAGNOSIS — I700 Atherosclerosis of aorta: Secondary | ICD-10-CM

## 2022-11-07 DIAGNOSIS — R079 Chest pain, unspecified: Secondary | ICD-10-CM

## 2022-11-07 DIAGNOSIS — R0781 Pleurodynia: Secondary | ICD-10-CM

## 2022-11-07 DIAGNOSIS — M109 Gout, unspecified: Secondary | ICD-10-CM

## 2022-11-07 DIAGNOSIS — S299XXA Unspecified injury of thorax, initial encounter: Secondary | ICD-10-CM | POA: Diagnosis not present

## 2022-11-07 DIAGNOSIS — J9811 Atelectasis: Secondary | ICD-10-CM | POA: Diagnosis not present

## 2022-11-07 MED ORDER — CARVEDILOL 6.25 MG PO TABS: 6.2500 mg | ORAL_TABLET | Freq: Two times a day (BID) | ORAL | 0 refills | Status: DC

## 2022-11-07 MED ORDER — LOSARTAN POTASSIUM 100 MG PO TABS: 100.0000 mg | ORAL_TABLET | Freq: Every day | ORAL | 1 refills | Status: DC

## 2022-11-07 MED ORDER — MIRTAZAPINE 45 MG PO TABS: 45.0000 mg | ORAL_TABLET | Freq: Every day | ORAL | 1 refills | Status: DC

## 2022-11-07 MED ORDER — TRAZODONE HCL 50 MG PO TABS: 25.0000 mg | ORAL_TABLET | Freq: Every evening | ORAL | 0 refills | Status: DC | PRN

## 2022-11-07 NOTE — Progress Notes (Signed)
Subjective:  Patient ID: Kari [name], female    DOB: October 22, 1945  Age: 77 y.o. MRN: [id number]  CC: The primary encounter diagnosis was Rib pain on left side. Diagnoses of Left-sided chest pain and Gouty arthritis of toe of right foot were also pertinent to this visit.   HPI Kari [name] presents for  Chief Complaint  Patient presents with   Hospitalization Follow-up    ED follow up from a fall that occurred on Father's Day. Pt stated that she fell over a flower pot landed on her left side rolled over and hit the back of her head.    T89 yr old female on chronic anticoagulation with Eliquis reated in ER on June 17 for blunt trauma to left side of body following a trip and fall at home.  CT Head, chest abd/pelvis,   DG knees, and unilateral left ribs done.:  no evidence of hemorrhage or  fractures. .  Left side is still painful and  ecchymotic,  hematoma noted.    Gout:  She averages  one attack per year.  Taking allopurinol.  [place] toxicity reviewed due to concurrent use of carvedilol. Rec dc from med list and use of prednisone prn   Lab Results  Component Value Date   LABURIC 5.5 09/08/2020      Outpatient Medications Prior to Visit  Medication Sig Dispense Refill   albuterol (VENTOLIN HFA) 108 (90 Base) MCG/ACT inhaler Inhale 2 puffs into the lungs every 6 (six) hours as needed for wheezing or shortness of breath. 8.5 g 03   allopurinol (ZYLOPRIM) 300 MG tablet Take 1 tablet (300 mg total) by mouth daily. 90 tablet 3   apixaban (ELIQUIS) 5 MG TABS tablet Take 1 tablet (5 mg total) by mouth 2 (two) times daily. 180 tablet 1   atorvastatin (LIPITOR) 40 MG tablet Take 1 tablet (40 mg total) by mouth daily. 90 tablet 3   betamethasone valerate ointment (VALISONE) 0.1 % Apply 1 Application topically 2 (two) times daily. 45 g 0   calcium carbonate (OS-CAL) 600 MG tablet Take 600 mg by mouth daily.     cetirizine (ZYRTEC) 10 MG tablet Take 10 mg by mouth daily.     Cinnamon 500 MG  TABS Take 500 mg by mouth daily.     fluticasone-salmeterol (ADVAIR DISKUS) 250-50 MCG/ACT AEPB TAKE 1 PUFF BY MOUTH TWICE A DAY 180 each 3   furosemide (LASIX) 20 MG tablet Take 1 tablet by mouth every other day 45 tablet 2   Lutein 20 MG TABS Take 20 mg by mouth daily.     Magnesium 250 MG TABS Take 250 mg by mouth daily.     mometasone (NASONEX) 50 MCG/ACT nasal spray Place 2 sprays into the nose daily. 17 g 3   montelukast (SINGULAIR) 10 MG tablet Take 1 tablet (10 mg total) by mouth at bedtime. 90 tablet 3   Multiple Vitamins-Minerals (CENTRUM MINIS WOMEN 50+) TABS Take 1 tablet by mouth daily.     Omega-3 Fatty Acids (FISH OIL) 1000 MG CAPS Take 1,000 mg by mouth daily.     OVER THE COUNTER MEDICATION Place 2 sprays into both nostrils at bedtime. Nasocort nasal spray     pantoprazole (PROTONIX) 40 MG tablet Take 1 tablet (40 mg total) by mouth daily. 90 tablet 3   Probiotic Product (PROBIOTIC PO) Take 1 tablet by mouth daily.     triamcinolone lotion (KENALOG) 0.1 % Apply 1 application topically 2 (two) times daily as needed.  carvedilol (COREG) 6.25 MG tablet Take 1 tablet (6.25 mg total) by mouth 2 (two) times daily. 180 tablet 0   colchicine 0.6 MG tablet Take 1 tablet (0.6 mg total) by mouth 2 (two) times daily. As needed for gout flare. 60 tablet 2   losartan (COZAAR) 100 MG tablet Take 1 tablet (100 mg total) by mouth at bedtime. 90 tablet 1   mirtazapine (REMERON) 45 MG tablet Take 1 tablet (45 mg total) by mouth at bedtime. 90 tablet 1   traZODone (DESYREL) 50 MG tablet Take 0.5-1 tablets (25-50 mg total) by mouth at bedtime as needed. for sleep 90 tablet 0   ondansetron (ZOFRAN-ODT) 4 MG disintegrating tablet Take 1 tablet (4 mg total) by mouth every 8 (eight) hours as needed for nausea or vomiting. (Patient not taking: Reported on 11/07/2022) 30 tablet 0   No facility-administered medications prior to visit.    Review of Systems;  Patient denies headache, fevers, malaise,  unintentional weight loss, skin rash, eye pain, sinus congestion and sinus pain, sore throat, dysphagia,  hemoptysis , cough, dyspnea, wheezing, chest pain, palpitations, orthopnea, edema, abdominal pain, nausea, melena, diarrhea, constipation, flank pain, dysuria, hematuria, urinary  Frequency, nocturia, numbness, tingling, seizures,  Focal weakness, Loss of consciousness,  Tremor, insomnia, depression, anxiety, and suicidal ideation.      Objective:  BP 130/68   Pulse 70   Temp 98.1 F (36.7 C) (Oral)   Ht 5\' 4"  (1.626 m)   Wt 198 lb (89.8 kg)   SpO2 94%   BMI 33.99 kg/m   BP Readings from Last 3 Encounters:  11/07/22 130/68  10/28/22 (!) 141/79  10/08/22 (!) 154/66    Wt Readings from Last 3 Encounters:  11/07/22 198 lb (89.8 kg)  10/27/22 195 lb (88.5 kg)  10/08/22 196 lb 6.4 oz (89.1 kg)    Physical Exam Vitals reviewed.  Constitutional:      General: She is not in acute distress.    Appearance: Normal appearance. She is normal weight. She is not ill-appearing, toxic-appearing or diaphoretic.  HENT:     Head: Normocephalic.  Eyes:     General: No scleral icterus.       Right eye: No discharge.        Left eye: No discharge.     Conjunctiva/sclera: Conjunctivae normal.  Cardiovascular:     Rate and Rhythm: Normal rate and regular rhythm.     Heart sounds: Normal heart sounds.  Pulmonary:     Effort: Pulmonary effort is normal. No respiratory distress.     Breath sounds: Normal breath sounds.  Abdominal:     Tenderness: There is abdominal tenderness in the left upper quadrant.       Comments: Hematoma formation left lateral thoracolumbar region  Musculoskeletal:        General: Normal range of motion.  Skin:    General: Skin is warm and dry.     Findings: Bruising present.  Neurological:     General: No focal deficit present.     Mental Status: She is alert and oriented to person, place, and time. Mental status is at baseline.  Psychiatric:        Mood  and Affect: Mood normal.        Behavior: Behavior normal.        Thought Content: Thought content normal.        Judgment: Judgment normal.    Lab Results  Component Value Date   HGBA1C 5.4 10/08/2022  HGBA1C 5.4 02/11/2022   HGBA1C 5.4 09/19/2021    Lab Results  Component Value Date   CREATININE 0.83 10/28/2022   CREATININE 0.75 10/08/2022   CREATININE 0.78 02/11/2022    Lab Results  Component Value Date   WBC 10.1 10/28/2022   HGB 11.4 (L) 10/28/2022   HCT 33.6 (L) 10/28/2022   PLT 192 10/28/2022   GLUCOSE 146 (H) 10/28/2022   CHOL 127 10/08/2022   TRIG 201.0 (H) 10/08/2022   HDL 40.80 10/08/2022   LDLDIRECT 59.0 10/08/2022   LDLCALC 79 02/11/2022   ALT 23 10/28/2022   AST 33 10/28/2022   NA 133 (L) 10/28/2022   K 3.5 10/28/2022   CL 101 10/28/2022   CREATININE 0.83 10/28/2022   BUN 15 10/28/2022   CO2 22 10/28/2022   TSH 1.57 10/08/2022   INR 1.2 10/28/2022   HGBA1C 5.4 10/08/2022   MICROALBUR 0.7 02/11/2022    CT Chest W Contrast  Result Date: 10/28/2022 CLINICAL DATA:  Fall, left rib pain. EXAM: CT CHEST, ABDOMEN, AND PELVIS WITH CONTRAST TECHNIQUE: Multidetector CT imaging of the chest, abdomen and pelvis was performed following the standard protocol during bolus administration of intravenous contrast. RADIATION DOSE REDUCTION: This exam was performed according to the departmental dose-optimization program which includes automated exposure control, adjustment of the mA and/or kV according to patient size and/or use of iterative reconstruction technique. CONTRAST:  OMNIPAQUE IOHEXOL 300 MG/ML  SOLN COMPARISON:  None Available. FINDINGS: CT CHEST FINDINGS Cardiovascular: Heart is normal size. Aorta is normal caliber. Coronary artery artery and aortic calcifications. Mediastinum/Nodes: No mediastinal, hilar, or axillary adenopathy. Trachea and esophagus are unremarkable. Thyroid unremarkable. Lungs/Pleura: Dependent and bibasilar atelectasis or scarring. No  effusions or pneumothorax. Musculoskeletal: Chest wall soft tissues are unremarkable. No acute bony abnormality. CT ABDOMEN PELVIS FINDINGS Hepatobiliary: Simple appearing central cyst measuring 4.3 cm. Gallbladder contracted. Small hyperdense focus in the gallbladder fundus, likely small stone. No biliary ductal dilatation. No evidence of hepatic injury or perihepatic hematoma. Pancreas: No focal abnormality or ductal dilatation. Spleen: No splenic injury or perisplenic hematoma. Adrenals/Urinary Tract: No adrenal hemorrhage or renal injury identified. Bladder is unremarkable. Stomach/Bowel: Normal appendix. Sigmoid diverticulosis. No active diverticulitis. Stomach and small bowel decompressed, unremarkable. Vascular/Lymphatic: Aortic atherosclerosis. No evidence of aneurysm or adenopathy. Reproductive: Uterus and adnexa unremarkable.  No mass. Other: No free fluid or free air. Small umbilical hernia containing fat. Musculoskeletal: Degenerative changes in the lumbar spine. No acute bony abnormality. IMPRESSION: No acute findings or significant traumatic injury in the chest, abdomen or pelvis. Coronary artery disease, aortic atherosclerosis. Dependent and bibasilar atelectasis or scarring. Sigmoid diverticulosis. Umbilical hernia containing fat. Electronically Signed   By: Charlett Nose M.D.   On: 10/28/2022 02:31   CT ABDOMEN PELVIS W CONTRAST  Result Date: 10/28/2022 CLINICAL DATA:  Fall, left rib pain. EXAM: CT CHEST, ABDOMEN, AND PELVIS WITH CONTRAST TECHNIQUE: Multidetector CT imaging of the chest, abdomen and pelvis was performed following the standard protocol during bolus administration of intravenous contrast. RADIATION DOSE REDUCTION: This exam was performed according to the departmental dose-optimization program which includes automated exposure control, adjustment of the mA and/or kV according to patient size and/or use of iterative reconstruction technique. CONTRAST:  OMNIPAQUE IOHEXOL 300  MG/ML  SOLN COMPARISON:  None Available. FINDINGS: CT CHEST FINDINGS Cardiovascular: Heart is normal size. Aorta is normal caliber. Coronary artery artery and aortic calcifications. Mediastinum/Nodes: No mediastinal, hilar, or axillary adenopathy. Trachea and esophagus are unremarkable. Thyroid unremarkable. Lungs/Pleura: Dependent and  bibasilar atelectasis or scarring. No effusions or pneumothorax. Musculoskeletal: Chest wall soft tissues are unremarkable. No acute bony abnormality. CT ABDOMEN PELVIS FINDINGS Hepatobiliary: Simple appearing central cyst measuring 4.3 cm. Gallbladder contracted. Small hyperdense focus in the gallbladder fundus, likely small stone. No biliary ductal dilatation. No evidence of hepatic injury or perihepatic hematoma. Pancreas: No focal abnormality or ductal dilatation. Spleen: No splenic injury or perisplenic hematoma. Adrenals/Urinary Tract: No adrenal hemorrhage or renal injury identified. Bladder is unremarkable. Stomach/Bowel: Normal appendix. Sigmoid diverticulosis. No active diverticulitis. Stomach and small bowel decompressed, unremarkable. Vascular/Lymphatic: Aortic atherosclerosis. No evidence of aneurysm or adenopathy. Reproductive: Uterus and adnexa unremarkable.  No mass. Other: No free fluid or free air. Small umbilical hernia containing fat. Musculoskeletal: Degenerative changes in the lumbar spine. No acute bony abnormality. IMPRESSION: No acute findings or significant traumatic injury in the chest, abdomen or pelvis. Coronary artery disease, aortic atherosclerosis. Dependent and bibasilar atelectasis or scarring. Sigmoid diverticulosis. Umbilical hernia containing fat. Electronically Signed   By: Charlett Nose M.D.   On: 10/28/2022 02:31   CT HEAD WO CONTRAST ( )  Result Date: 10/28/2022 CLINICAL DATA:  Larey Seat and hit head. EXAM: CT HEAD WITHOUT CONTRAST TECHNIQUE: Contiguous axial images were obtained from the base of the skull through the vertex without intravenous  contrast. RADIATION DOSE REDUCTION: This exam was performed according to the departmental dose-optimization program which includes automated exposure control, adjustment of the mA and/or kV according to patient size and/or use of iterative reconstruction technique. COMPARISON:  None Available. FINDINGS: Brain: No evidence of acute infarction, hemorrhage, hydrocephalus, extra-axial collection or mass lesion/mass effect. Vascular: There are scattered calcific plaques in the carotid siphons, distal vertebral arteries. No hyperdense central vessel is seen. Skull: There is mild swelling and stranding in the posterior right parieto-occipital scalp. The calvarium, skull base and orbits are intact without focal skull lesions. Sinuses/Orbits: Negative orbits apart from prior lens extractions. Clear sinuses and mastoids. Other: None. IMPRESSION: 1. No acute intracranial CT findings or depressed skull fractures. 2. Right posterior parieto-occipital scalp swelling and stranding. Electronically Signed   By: Almira Bar M.D.   On: 10/28/2022 00:49   DG Knee Complete 4 Views Left  Result Date: 10/28/2022 CLINICAL DATA:  Fall injury, left knee pain. EXAM: LEFT KNEE - COMPLETE 4+ VIEW COMPARISON:  None Available. FINDINGS: There is osteopenia without evidence of fractures or suprapatellar bursal effusion. Superficial soft tissues are unremarkable. There is mild narrowing and spurring of the patellofemoral and the proximal tibiofibular joints. The femorotibial joint is unremarkable. There are no visible loose bodies. No other focal bone abnormality. IMPRESSION: Osteopenia and degenerative change without evidence of fractures. Electronically Signed   By: Almira Bar M.D.   On: 10/28/2022 00:41   DG Ribs Unilateral W/Chest Left  Result Date: 10/28/2022 CLINICAL DATA:  Fall.  Left chest wall pain. EXAM: LEFT RIBS AND CHEST - 3+ VIEW COMPARISON:  None Available. FINDINGS: No focal consolidation, pleural effusion, or  pneumothorax. The cardiac silhouette is within limits. Atherosclerotic calcification of the aortic arch. No acute osseous pathology. No displaced rib fractures. IMPRESSION: 1. No acute cardiopulmonary process. 2. No displaced rib fractures. Electronically Signed   By: Elgie Collard M.D.   On: 10/28/2022 00:37    Assessment & Plan:  .Rib pain on left side Assessment & Plan: I am repeating unilateral left rib films to confirm that there were no fractures from the June 17 fall.  She is advised to treat her pain with tylenol 1000 mg twice  daily to allow her to practice deep breathing   Orders: -     DG Ribs Unilateral Left; Future  Left-sided chest pain Assessment & Plan: Secondary to traumatic fall . Repeat chest films ordered to document any fractures    Gouty arthritis of toe of right foot Assessment & Plan: Continue prednisone taper and resume colchicine on day 6.  Once daily for several days.  Return in 2 weeks for uric acid level. advised to stop Neuriva    Other orders -     Carvedilol; Take 1 tablet (6.25 mg total) by mouth 2 (two) times daily.  Dispense: 180 tablet; Refill: 0 -     Losartan Potassium; Take 1 tablet (100 mg total) by mouth at bedtime.  Dispense: 90 tablet; Refill: 1 -     Mirtazapine; Take 1 tablet (45 mg total) by mouth at bedtime.  Dispense: 90 tablet; Refill: 1 -     traZODone HCl; Take 0.5-1 tablets (25-50 mg total) by mouth at bedtime as needed. for sleep  Dispense: 90 tablet; Refill: 0     I provided 30 minutes of face-to-face time during this encounter reviewing patient's last visit with me, patient's  most recent  ER visit  with  labs and imaging studies, counseling on currently addressed issues,  and post visit ordering to diagnostics and therapeutics .   Follow-up: No follow-ups on file.   Sherlene Shams, MD

## 2022-11-07 NOTE — Assessment & Plan Note (Signed)
I am repeating unilateral left rib films to confirm that there were no fractures from the June 17 fall.  She is advised to treat her pain with tylenol 1000 mg twice daily to allow her to practice deep breathing

## 2022-11-07 NOTE — Patient Instructions (Addendum)
You have had all pneumonia  vaccines  I am repeating the x ray of your ribs to confirm that none were fractured  The lump on your side is a hematoma .  It will shrink over time (but not as fast as a bruise ,  it is slow)    You can Apply ice packs (use frozen vegetables)  for 15 minutes  to the lump ,  with a break  in between of 15 minutes

## 2022-11-09 DIAGNOSIS — R079 Chest pain, unspecified: Secondary | ICD-10-CM | POA: Insufficient documentation

## 2022-11-09 NOTE — Assessment & Plan Note (Signed)
Continue prednisone taper and resume colchicine on day 6.  Once daily for several days.  Return in 2 weeks for uric acid level. advised to stop Neuriva  

## 2022-11-09 NOTE — Assessment & Plan Note (Signed)
Secondary to traumatic fall . Repeat chest films ordered to document any fractures

## 2022-11-14 DIAGNOSIS — I700 Atherosclerosis of aorta: Secondary | ICD-10-CM | POA: Insufficient documentation

## 2022-11-14 NOTE — Assessment & Plan Note (Signed)
Repeat films are negative for fracture.  Advised on how to manage hematoma

## 2022-11-14 NOTE — Assessment & Plan Note (Signed)
Noted on recent chest fil.  She is already taking high potency staitn (atorvastaittn 40 mg)

## 2022-12-18 ENCOUNTER — Encounter: Payer: Self-pay | Admitting: Nurse Practitioner

## 2022-12-18 ENCOUNTER — Ambulatory Visit: Payer: PPO | Attending: Nurse Practitioner | Admitting: Nurse Practitioner

## 2022-12-18 VITALS — BP 130/68 | HR 59 | Ht 64.0 in | Wt 199.0 lb

## 2022-12-18 DIAGNOSIS — I480 Paroxysmal atrial fibrillation: Secondary | ICD-10-CM | POA: Diagnosis not present

## 2022-12-18 DIAGNOSIS — I10 Essential (primary) hypertension: Secondary | ICD-10-CM

## 2022-12-18 DIAGNOSIS — E785 Hyperlipidemia, unspecified: Secondary | ICD-10-CM

## 2022-12-18 DIAGNOSIS — R002 Palpitations: Secondary | ICD-10-CM

## 2022-12-18 DIAGNOSIS — I2510 Atherosclerotic heart disease of native coronary artery without angina pectoris: Secondary | ICD-10-CM | POA: Diagnosis not present

## 2022-12-18 NOTE — Patient Instructions (Signed)
Medication Instructions:   Your physician recommends that you continue on your current medications as directed. Please refer to the Current Medication list given to you today.  *If you need a refill on your cardiac medications before your next appointment, please call your pharmacy*   Lab Work:  No labs ordered today.  If you have labs (blood work) drawn today and your tests are completely normal, you will receive your results only by: MyChart Message (if you have MyChart) OR A paper copy in the mail If you have any lab test that is abnormal or we need to change your treatment, we will call you to review the results.   Testing/Procedures:  No testing ordered today.   Follow-Up: At [facility], you and your health needs are our priority.  As part of our continuing mission to provide you with exceptional heart care, we have created designated Provider Care Teams.  These Care Teams include your primary Cardiologist (physician) and Advanced Practice Providers (APPs -  Physician Assistants and Nurse Practitioners) who all work together to provide you with the care you need, when you need it.  We recommend signing up for the patient portal called "MyChart".  Sign up information is provided on this After Visit Summary.  MyChart is used to connect with patients for Virtual Visits (Telemedicine).  Patients are able to view lab/test results, encounter notes, upcoming appointments, etc.  Non-urgent messages can be sent to your provider as well.   To learn more about what you can do with MyChart, go to [email or web address].    Your next appointment:   6 month(s)  Provider:   Bryan Lemma, MD

## 2022-12-18 NOTE — Progress Notes (Signed)
Office Visit    Patient Name: Kari [name] Date of Encounter: 12/18/2022  Primary Care Provider:  Sherlene Shams, MD Primary Cardiologist:  Bryan Lemma, MD  Chief Complaint    77 y.o. female with history of coronary artery disease, type 2 diabetes mellitus, diabetic neuropathy, obesity, hyperlipidemia, vitamin D deficiency, COPD, asthma, GERD, venous stasis, lymphedema, gouty arthritis, osteoarthritis, and paroxysmal atrial fibrillation, who presents for f/u related to CAD and PAF.  Past Medical History    Past Medical History:  Diagnosis Date   Allergy    to cat, dogs, pollen, dust, cigarette smoke   Arthritis    feet   Cataract    removed both eyes   COPD (chronic obstructive pulmonary disease) with chronic bronchitis    And moderate persistent asthma; complicated by Restrictive Lung Disease from obesity   Coronary artery disease involving native coronary artery of native heart with other form of angina pectoris (HCC)    a. 04/2021 Cor CTA: Cor Ca2+ 389. Domninant RCA-RPDA<PLA. RCA >70p (abnl CTFFR), LAD 50-69p, LCX Min dz; b. 04/2021 Cath/PCI: LM nl, LAD mild diff dzs, D1/2/3 nl, LCX min irregs, OM1/2/3 nl, RCA 99p (2.75x30 Onyx Frontier DES).   Depression    Diastolic dysfunction    a. 04/2021 Echo: EF 60-65%, GrI DD. Nl RV fxn. Mildly dil LA.   Esophageal dysmotility 04/30/2022   Barium swallow 04/30/2022-also has impingement of tablet at aortic arch, we know from CT angiography that she has mild aortic enlargement and pulmonary outflow tract enlargement   GERD (gastroesophageal reflux disease)    Hx of adenomatous colonic polyps 05/30/2010   Hyperlipidemia    Hypertension    Lymphedema of both lower extremities    No superficial venous reflux noted.  However right-sided saphenofemoral junction reflux noted and left common femoral vein reflux noted.  No DVT or superficial thrombus.   Menopause    Migraines    Miscarriage    X 1   Moderate persistent asthma     Obesity (BMI 30.0-34.9) 03/04/2013   PAF (paroxysmal atrial fibrillation) (HCC)    a. CHA2DS2VASc = 5-->eliquis.   PONV (postoperative nausea and vomiting)    Past Surgical History:  Procedure Laterality Date   CATARACT EXTRACTION, BILATERAL     CESAREAN SECTION     X 2   COLONOSCOPY     CORONARY STENT INTERVENTION N/A 04/30/2021   Procedure: CORONARY STENT INTERVENTION;  Surgeon: Iran Ouch, MD;  Location: [facility] INVASIVE CV LAB;  Service: Cardiovascular;  Proximal RCA 99% => DES PCI with Onyx Frontier 2.75 mm x 30 mm   DENTAL SURGERY  09/2016   EYE SURGERY     eye lid surgery 11/2021   LEFT HEART CATH AND CORONARY ANGIOGRAPHY Left 04/30/2021   Procedure: LEFT HEART CATH AND CORONARY ANGIOGRAPHY;  Surgeon: Iran Ouch, MD;  Location: [facility] INVASIVE CV LAB;  Service: Cardiovascular;; Proximal RCA 99% => DES PCI;  Ony; mild diffuse LAD and minimal diffuse LCx disease.  Moderately elevated EDP (22 mmHg).  EF 55-65 %.   POLYPECTOMY     TRANSTHORACIC ECHOCARDIOGRAM  11/04/2019   a) EF 55 to 60%.  Normal LV size and function.  GR 1 DD.  Unable to assess.  Mild LA dilation.  Normal aortic and mitral valve.  Normal RVP.  (Normal study);; b) 04/2021: Normal LV size and function.  EF 60 to 65%.  No RWMA.  GR 1 DD.Normal RV size and function.  Normal aortic and mitral  valves.   TUBAL LIGATION  1980    Allergies  Allergies  Allergen Reactions   Colchicine Other (See Comments)    C/I with carvedilol   Hydrochlorothiazide Other (See Comments)    Severe hyponatremia    Nifedipine     REACTION: swollen feet    History of Present Illness      77 y.o. y/o female with above past medical history including CAD, paroxysmal atrial fibrillation, type 2 diabetes mellitus, diabetic neuropathy, obesity, hyperlipidemia, vitamin D deficiency, COPD, asthma, GERD, venous stasis, lymphedema, gouty arthritis, and osteoporosis.  In late 2022, she was seen in the [facility] ED with palpitations found to be  in atrial fibrillation with rapid ventricular response, which responded to IV diltiazem and she was subsequently placed on oral beta-blocker and Eliquis.  At follow-up, she reported chest pain and dyspnea that have occurred during her episode of A-fib, and she underwent coronary CT angiogram in December 2022 revealing severe proximal RCA disease.  She subsequently underwent diagnostic catheterization revealing 99% stenosis in the proximal RCA with otherwise minimal, nonobstructive CAD.  The RCA was successfully treated with a 2.75 x 30 mm Onyx frontier drug-eluting stent.  Postprocedure was complicated by right radial hematoma, which was managed with compression.  Follow-up echocardiogram in late December 2022, showed EF of 60 to 65% with grade 1 diastolic dysfunction.    Ms. Quirindongo was last seen in cardiology clinic in 09/2022, at which time her metoprolol was changed to carvedilol in the setting of HTN. In 10/2022, she was seen in the ED following a fall, after slipping on a wet surface.  She was noted to have a large left chest wall hematoma.  Labs and imaging were stable and she was d/c'd w/ pain mgmt. the recovery has been slow, she notes that her hematoma has more or less resolved at this point and she has not been experiencing any significant pain.  She remains relatively active without symptoms or limitations.  She denies chest pain, dyspnea, palpitations, PND, orthopnea, dizziness, syncope, or early satiety.  She sometimes notes right greater than left ankle swelling though this is typically mild, and she has not had to use any Lasix in some time.  She indicates today, that she will need to have 4 teeth pulled and some bone grafting at some point in the near future, and will need to hold her Eliquis just prior to the procedure.  Home Medications    Current Outpatient Medications  Medication Sig Dispense Refill   albuterol (VENTOLIN HFA) 108 (90 Base) MCG/ACT inhaler Inhale 2 puffs into the lungs  every 6 (six) hours as needed for wheezing or shortness of breath. 8.5 g 03   allopurinol (ZYLOPRIM) 300 MG tablet Take 1 tablet (300 mg total) by mouth daily. 90 tablet 3   apixaban (ELIQUIS) 5 MG TABS tablet Take 1 tablet (5 mg total) by mouth 2 (two) times daily. 180 tablet 1   atorvastatin (LIPITOR) 40 MG tablet Take 1 tablet (40 mg total) by mouth daily. 90 tablet 3   betamethasone valerate ointment (VALISONE) 0.1 % Apply 1 Application topically 2 (two) times daily. 45 g 0   calcium carbonate (OS-CAL) 600 MG tablet Take 600 mg by mouth daily.     carvedilol (COREG) 6.25 MG tablet Take 1 tablet (6.25 mg total) by mouth 2 (two) times daily. 180 tablet 0   cetirizine (ZYRTEC) 10 MG tablet Take 10 mg by mouth daily.     Cinnamon 500 MG TABS Take  500 mg by mouth daily.     fluticasone-salmeterol (ADVAIR DISKUS) 250-50 MCG/ACT AEPB TAKE 1 PUFF BY MOUTH TWICE A DAY 180 each 3   furosemide (LASIX) 20 MG tablet Take 1 tablet by mouth every other day 45 tablet 2   losartan (COZAAR) 100 MG tablet Take 1 tablet (100 mg total) by mouth at bedtime. 90 tablet 1   Lutein 20 MG TABS Take 20 mg by mouth daily.     Magnesium 250 MG TABS Take 250 mg by mouth daily.     mirtazapine (REMERON) 45 MG tablet Take 1 tablet (45 mg total) by mouth at bedtime. 90 tablet 1   mometasone (NASONEX) 50 MCG/ACT nasal spray Place 2 sprays into the nose daily. 17 g 3   montelukast (SINGULAIR) 10 MG tablet Take 1 tablet (10 mg total) by mouth at bedtime. 90 tablet 3   Multiple Vitamins-Minerals (CENTRUM MINIS WOMEN 50+) TABS Take 1 tablet by mouth daily.     Omega-3 Fatty Acids (FISH OIL) 1000 MG CAPS Take 1,000 mg by mouth daily.     OVER THE COUNTER MEDICATION Place 2 sprays into both nostrils at bedtime. Nasocort nasal spray     pantoprazole (PROTONIX) 40 MG tablet Take 1 tablet (40 mg total) by mouth daily. 90 tablet 3   Probiotic Product (PROBIOTIC PO) Take 1 tablet by mouth daily.     traZODone (DESYREL) 50 MG tablet  Take 0.5-1 tablets (25-50 mg total) by mouth at bedtime as needed. for sleep 90 tablet 0   triamcinolone lotion (KENALOG) 0.1 % Apply 1 application topically 2 (two) times daily as needed.     Turmeric (QC TUMERIC COMPLEX PO) Take 1 capsule by mouth daily.     No current facility-administered medications for this visit.     Review of Systems    Occasional right greater than left ankle swelling.  She denies chest pain, palpitations, dyspnea, pnd, orthopnea, n, v, dizziness, syncope, weight gain, or early satiety.  All other systems reviewed and are otherwise negative except as noted above.    Physical Exam    VS:  BP 130/68 (BP Location: Left Arm, Patient Position: Sitting, Cuff Size: Normal)   Pulse (!) 59   Ht 5\' 4"  (1.626 m)   Wt 199 lb (90.3 kg)   SpO2 95%   BMI 34.16 kg/m  , BMI Body mass index is 34.16 kg/m.     GEN: Well nourished, well developed, in no acute distress. HEENT: normal. Neck: Supple, no JVD, carotid bruits, or masses. Cardiac: RRR, no murmurs, rubs, or gallops. No clubbing, cyanosis, edema.  Radials 2+/PT 2+ and equal bilaterally.  Respiratory:  Respirations regular and unlabored, clear to auscultation bilaterally. GI: Soft, nontender, nondistended, BS + x 4. MS: no deformity or atrophy. Skin: warm and dry, no rash. Neuro:  Strength and sensation are intact. Psych: Normal affect.  Accessory Clinical Findings    ECG personally reviewed by me today - EKG Interpretation Date/Time:  Wednesday December 18 2022 13:41:11 EDT Ventricular Rate:  59 PR Interval:  190 QRS Duration:  78 QT Interval:  442 QTC Calculation: 437 R Axis:   193  Text Interpretation: Sinus bradycardia Suspect limb reversal Confirmed by Nicolasa Ducking [phone]) on 12/18/2022 1:46:54 PM  - no acute changes.  Lab Results  Component Value Date   WBC 10.1 10/28/2022   HGB 11.4 (L) 10/28/2022   HCT 33.6 (L) 10/28/2022   MCV 93.3 10/28/2022   PLT 192 10/28/2022   Lab  Results   Component Value Date   CREATININE 0.83 10/28/2022   BUN 15 10/28/2022   NA 133 (L) 10/28/2022   K 3.5 10/28/2022   CL 101 10/28/2022   CO2 22 10/28/2022   Lab Results  Component Value Date   ALT 23 10/28/2022   AST 33 10/28/2022   ALKPHOS 94 10/28/2022   BILITOT 0.6 10/28/2022   Lab Results  Component Value Date   CHOL 127 10/08/2022   HDL 40.80 10/08/2022   LDLCALC 79 02/11/2022   LDLDIRECT 59.0 10/08/2022   TRIG 201.0 (H) 10/08/2022   CHOLHDL 3 10/08/2022    Lab Results  Component Value Date   HGBA1C 5.4 10/08/2022    Assessment & Plan    1.  CAD:  s/p DES  RCA in late 2022.  She continues to do well without chest pain or dyspnea.  She remains on beta-blocker, statin, ARB.  No aspirin in the setting of Eliquis.  2.  Paroxysmal atrial fibrillation: No recent palpitations.  Previously switch metoprolol to carvedilol in the setting of hypertension.  Remains on Eliquis 5 mg twice daily.  Prior to the end of the year, she will be in need of removal of 4 teeth and bone grafting.  CHA2DS2VASc = 5.  Creat cl calculates 83.45 using her current weight.  She will be able to hold her Eliquis for 2 days prior to the procedure we will plan to resume postoperatively.  3.  Palpitations: Quiescent.  Continue beta-blocker.  4.  Primary hypertension: Improved since switching to carvedilol.  Continue current dose along with losartan.  5.  Hyperlipidemia: LDL of 59 in May with normal LFTs in June.  Continue atorvastatin 40 mg daily.  6.  Type 2 diabetes mellitus: A1c 5.4 in May 2024.  Diet controlled.  7.  Disposition: Follow-up in 6 months or sooner if necessary.  Informed Consent    Nicolasa Ducking, NP 12/18/2022, 2:08 PM

## 2023-01-20 ENCOUNTER — Encounter: Payer: Self-pay | Admitting: Internal Medicine

## 2023-01-20 ENCOUNTER — Encounter: Payer: Self-pay | Admitting: Cardiology

## 2023-01-20 DIAGNOSIS — I480 Paroxysmal atrial fibrillation: Secondary | ICD-10-CM

## 2023-01-20 DIAGNOSIS — M109 Gout, unspecified: Secondary | ICD-10-CM

## 2023-01-20 MED ORDER — ALLOPURINOL 300 MG PO TABS
300.0000 mg | ORAL_TABLET | Freq: Every day | ORAL | 3 refills | Status: DC
Start: 2023-01-20 — End: 2024-01-13

## 2023-01-20 MED ORDER — MONTELUKAST SODIUM 10 MG PO TABS: 10.0000 mg | ORAL_TABLET | Freq: Every day | ORAL | 3 refills | Status: DC

## 2023-01-20 MED ORDER — APIXABAN 5 MG PO TABS
5.0000 mg | ORAL_TABLET | Freq: Two times a day (BID) | ORAL | 1 refills | Status: DC
Start: 2023-01-20 — End: 2023-08-18

## 2023-01-20 NOTE — Telephone Encounter (Signed)
Prescription refill request for Eliquis received. Indication: Afib  Last office visit: 12/18/22 Brion Aliment)  Scr:  0.83 (10/28/22)  Age: 77 Weight: 90.3kg  Appropriate dose. Refill sent.

## 2023-01-30 ENCOUNTER — Other Ambulatory Visit: Payer: Self-pay | Admitting: Internal Medicine

## 2023-02-20 ENCOUNTER — Encounter: Payer: Self-pay | Admitting: Cardiology

## 2023-02-21 DIAGNOSIS — H5203 Hypermetropia, bilateral: Secondary | ICD-10-CM | POA: Diagnosis not present

## 2023-02-21 DIAGNOSIS — H04123 Dry eye syndrome of bilateral lacrimal glands: Secondary | ICD-10-CM | POA: Diagnosis not present

## 2023-02-21 DIAGNOSIS — H43813 Vitreous degeneration, bilateral: Secondary | ICD-10-CM | POA: Diagnosis not present

## 2023-02-21 DIAGNOSIS — H35412 Lattice degeneration of retina, left eye: Secondary | ICD-10-CM | POA: Diagnosis not present

## 2023-02-21 DIAGNOSIS — H524 Presbyopia: Secondary | ICD-10-CM | POA: Diagnosis not present

## 2023-02-21 DIAGNOSIS — H02831 Dermatochalasis of right upper eyelid: Secondary | ICD-10-CM | POA: Diagnosis not present

## 2023-02-21 DIAGNOSIS — H52223 Regular astigmatism, bilateral: Secondary | ICD-10-CM | POA: Diagnosis not present

## 2023-02-21 DIAGNOSIS — H02834 Dermatochalasis of left upper eyelid: Secondary | ICD-10-CM | POA: Diagnosis not present

## 2023-02-21 DIAGNOSIS — H35372 Puckering of macula, left eye: Secondary | ICD-10-CM | POA: Diagnosis not present

## 2023-02-24 ENCOUNTER — Encounter: Payer: Self-pay | Admitting: Internal Medicine

## 2023-02-25 MED ORDER — PANTOPRAZOLE SODIUM 40 MG PO TBEC: 40.0000 mg | DELAYED_RELEASE_TABLET | Freq: Every day | ORAL | 3 refills | Status: DC

## 2023-03-18 ENCOUNTER — Encounter: Payer: Self-pay | Admitting: Internal Medicine

## 2023-03-18 MED ORDER — ATORVASTATIN CALCIUM 40 MG PO TABS: 40.0000 mg | ORAL_TABLET | Freq: Every day | ORAL | 1 refills | Status: DC

## 2023-04-04 DIAGNOSIS — H35372 Puckering of macula, left eye: Secondary | ICD-10-CM | POA: Diagnosis not present

## 2023-04-04 DIAGNOSIS — H524 Presbyopia: Secondary | ICD-10-CM | POA: Diagnosis not present

## 2023-04-04 DIAGNOSIS — H04123 Dry eye syndrome of bilateral lacrimal glands: Secondary | ICD-10-CM | POA: Diagnosis not present

## 2023-04-04 DIAGNOSIS — H5203 Hypermetropia, bilateral: Secondary | ICD-10-CM | POA: Diagnosis not present

## 2023-04-04 DIAGNOSIS — H02831 Dermatochalasis of right upper eyelid: Secondary | ICD-10-CM | POA: Diagnosis not present

## 2023-04-04 DIAGNOSIS — H52223 Regular astigmatism, bilateral: Secondary | ICD-10-CM | POA: Diagnosis not present

## 2023-04-04 DIAGNOSIS — H43813 Vitreous degeneration, bilateral: Secondary | ICD-10-CM | POA: Diagnosis not present

## 2023-04-04 DIAGNOSIS — H02834 Dermatochalasis of left upper eyelid: Secondary | ICD-10-CM | POA: Diagnosis not present

## 2023-04-04 DIAGNOSIS — H35412 Lattice degeneration of retina, left eye: Secondary | ICD-10-CM | POA: Diagnosis not present

## 2023-04-06 ENCOUNTER — Other Ambulatory Visit: Payer: Self-pay | Admitting: Internal Medicine

## 2023-04-14 ENCOUNTER — Emergency Department
Admission: EM | Admit: 2023-04-14 | Discharge: 2023-04-14 | Disposition: A | Discharge status: Home / Self Care | Payer: PPO | Attending: Emergency Medicine | Admitting: Emergency Medicine

## 2023-04-14 ENCOUNTER — Other Ambulatory Visit: Payer: Self-pay

## 2023-04-14 ENCOUNTER — Emergency Department: Payer: PPO

## 2023-04-14 DIAGNOSIS — Z7901 Long term (current) use of anticoagulants: Secondary | ICD-10-CM | POA: Diagnosis not present

## 2023-04-14 DIAGNOSIS — R002 Palpitations: Secondary | ICD-10-CM | POA: Diagnosis not present

## 2023-04-14 DIAGNOSIS — I700 Atherosclerosis of aorta: Secondary | ICD-10-CM | POA: Diagnosis not present

## 2023-04-14 DIAGNOSIS — I4891 Unspecified atrial fibrillation: Secondary | ICD-10-CM | POA: Insufficient documentation

## 2023-04-14 DIAGNOSIS — I4892 Unspecified atrial flutter: Secondary | ICD-10-CM | POA: Insufficient documentation

## 2023-04-14 LAB — CBC
HCT: 38.5 % (ref 36.0–46.0)
Hemoglobin: 13.4 g/dL (ref 12.0–15.0)
MCH: 31.8 pg (ref 26.0–34.0)
MCHC: 34.8 g/dL (ref 30.0–36.0)
MCV: 91.4 fL (ref 80.0–100.0)
Platelets: 188 10*3/uL (ref 150–400)
RBC: 4.21 MIL/uL (ref 3.87–5.11)
RDW: 14.3 % (ref 11.5–15.5)
WBC: 7.1 10*3/uL (ref 4.0–10.5)
nRBC: 0 % (ref 0.0–0.2)

## 2023-04-14 LAB — BASIC METABOLIC PANEL
Anion gap: 11 (ref 5–15)
BUN: 11 mg/dL (ref 8–23)
CO2: 23 mmol/L (ref 22–32)
Calcium: 9.2 mg/dL (ref 8.9–10.3)
Chloride: 105 mmol/L (ref 98–111)
Creatinine, Ser: 0.84 mg/dL (ref 0.44–1.00)
GFR, Estimated: >60 mL/min (ref >60)
Glucose, Bld: 119 mg/dL — ABNORMAL HIGH (ref 70–99)
Potassium: 3.7 mmol/L (ref 3.5–5.1)
Sodium: 139 mmol/L (ref 135–145)

## 2023-04-14 LAB — TROPONIN I (HIGH SENSITIVITY): Troponin I (High Sensitivity): 5 ng/L (ref <18)

## 2023-04-14 NOTE — ED Notes (Signed)
Full rainbow sent to lab.  

## 2023-04-14 NOTE — ED Notes (Signed)
See triage note  Presents with some chest discomfort and felt like her hear was beating fast  States this episode woke her up

## 2023-04-14 NOTE — ED Provider Notes (Signed)
[facility] Provider Note    Event Date/Time   First MD Initiated Contact with Patient 04/14/23 [phone]     (approximate)   History   Atrial Fibrillation   HPI  Kari [name] is a 77 y.o. female with history of paroxysmal A-fib on Eliquis who comes in with concerns for palpitations.  Patient reports that at 650 she woke up from sleep with palpitations.  She denies any chest pain or shortness of breath.  She reports taking her Eliquis and her carvedilol waited about 10 to 15 minutes but given she continued to have the symptoms she presented to the ER.  She reports that since being here her heart rate seem to be much better and she does not feel the palpitations as earlier.  She denies any abdominal pain, leg swelling, shortness of breath or other concerns.  Physical Exam   Triage Vital Signs: ED Triage Vitals  Encounter Vitals Group     BP 04/14/23 0731 139/83     Systolic BP Percentile --      Diastolic BP Percentile --      Pulse Rate 04/14/23 0731 80     Resp 04/14/23 0731 19     Temp 04/14/23 0731 98 F (36.7 C)     Temp src --      SpO2 04/14/23 0731 100 %     Weight 04/14/23 0729 200 lb (90.7 kg)     Height 04/14/23 0729 5\' 4"  (1.626 m)     Head Circumference --      Peak Flow --      Pain Score 04/14/23 0729 0     Pain Loc --      Pain Education --      Exclude from Growth Chart --     Most recent vital signs: Vitals:   04/14/23 0731  BP: 139/83  Pulse: 80  Resp: 19  Temp: 98 F (36.7 C)  SpO2: 100%     General: Awake, no distress.  CV:  Good peripheral perfusion.  Regular Resp:  Normal effort.  Clear lungs Abd:  No distention.  Other:  No swelling in legs.  No calf tenderness   ED Results / Procedures / Treatments   Labs (all labs ordered are listed, but only abnormal results are displayed) Labs Reviewed  BASIC METABOLIC PANEL  CBC  TROPONIN I (HIGH SENSITIVITY)     EKG  My interpretation of EKG:  Normal sinus  rate of 82 without any ST elevation or T wave inversions except for lead III, normal intervals  RADIOLOGY I have reviewed the xray personally and interpreted and no pneumonia  PROCEDURES:  Critical Care performed: no   .1-3 Lead EKG Interpretation  Performed by: Concha Se, MD Authorized by: Concha Se, MD     Interpretation: normal     ECG rate:  70   ECG rate assessment: normal     Rhythm: sinus rhythm     Ectopy: none     Conduction: normal      MEDICATIONS ORDERED IN ED: Medications - No data to display   IMPRESSION / MDM / ASSESSMENT AND PLAN / ED COURSE  I reviewed the triage vital signs and the nursing notes.   Patient's presentation is most consistent with acute presentation with potential threat to life or bodily function.   Patient comes in with concern for A-fib with RVR.  On evaluation here patient is in sinus rhythm.  Her EKG she has  a slight T wave inversion in lead III but I reviewed some prior EKGs as he is occasionally had this T wave inversion previously.  She currently denies any shortness of breath to suggest PE no swelling in legs or calf tenderness to suggest DVT and patient has been compliant with her Eliquis.  Labs ordered evaluate for electrolyte abnormalities, ACS.  We discussed that her doctor has not gone up on her carvedilol due to issues with bradycardia in the past.  I reviewed a cardiology note from 12/18/2022 where her heart rate was 59.  She remains on 6.25 of Coreg 2 times daily.  Troponin was negative.  BMP reassuring, CBC normal.  Reevaluated patient has been here for over an hour without any recurrent atrial fibrillation.  We discussed repeat troponin but patient denies any chest pain or shortness of breath when this started reports that she is at her baseline self at this time.  We discussed that we have not fully ruled out heart attack given onset of symptoms < 3 hours from initial trop but given she is asymptomatic at this time she does  not want to stay any longer for repeat heart markers. Husband is at bedside and witnesses this conversation.  She would prefer to go home and return if symptoms are worsening.  She thinks that this is all related to her A-fib but when she flipped into it.  We discussed holding off on changing her medications due to her bradycardia previously and she will record her heart rates 3 times daily and follow-up with cardiology outpatient.    The patient is on the cardiac monitor to evaluate for evidence of arrhythmia and/or significant heart rate changes.      FINAL CLINICAL IMPRESSION(S) / ED DIAGNOSES   Final diagnoses:  Atrial fib/flutter, transient (HCC)  Palpitation     Rx / DC Orders   ED Discharge Orders     None        Note:  This document was prepared using Dragon voice recognition software and may include unintentional dictation errors.   Concha Se, MD 04/14/23 [phone]

## 2023-04-14 NOTE — Discharge Instructions (Addendum)
I suspect you went into A fib earlier this morning but you have come back out of it with taking her medications.  You are currently in sinus rate now.  We discussed not adjusting your medications due to your history of low heart rates.  You should record your heart rates once at breakfast lunch and dinner to take to your cardiologist.  Please call them to make a follow-up appointment to discuss additional treatments if this is to happen again.     We recommended a repeat troponin to rule out heart attack but you have opted to decline this given you are denied any chest pain or shortness of breath   Return to the ER if you develop chest pain, shortness of breath worsening symptoms or any other concerns

## 2023-04-14 NOTE — ED Triage Notes (Signed)
Pt come with c/o afib episode. Pt states she feels like it is beating too much and too hard. Pt states hx of  this. Pt is on thinner. Pt denies any cp or sob.

## 2023-04-30 DIAGNOSIS — H4321 Crystalline deposits in vitreous body, right eye: Secondary | ICD-10-CM | POA: Diagnosis not present

## 2023-04-30 DIAGNOSIS — H35342 Macular cyst, hole, or pseudohole, left eye: Secondary | ICD-10-CM | POA: Diagnosis not present

## 2023-04-30 DIAGNOSIS — H43812 Vitreous degeneration, left eye: Secondary | ICD-10-CM | POA: Diagnosis not present

## 2023-05-01 ENCOUNTER — Telehealth: Payer: Self-pay | Admitting: Cardiology

## 2023-05-01 NOTE — Telephone Encounter (Signed)
Patient would like to do a provider switch from Dr. Herbie Baltimore to Dr. Azucena Cecil. Would both of you approve of the provider switch?

## 2023-05-07 IMAGING — CT CT HEART MORP W/ CTA COR W/ SCORE W/ CA W/CM &/OR W/O CM
1 of 15 series · 3 of 20 positions shown, 4 images · non-contrast
Comparison: 04/09/2021 chest radiograph.  09/13/2019 CTA chest.
COMPARISON: 04/09/2021 chest radiograph.  09/13/2019 CTA chest.

Addendum:
CLINICAL DATA: Fatigue, aortic root calcification

EXAM:
Cardiac/Coronary  CTA
TECHNIQUE: The patient was scanned on a Siemens Somatom go.Top scanner.

[Series 26: multiphase % cta coronary 0.60 · axial · 0.38mm/px · z∈[-1115,-1053]mm · 3 of 3756 slices shown, 4 images]
[im [id number]  vessel]
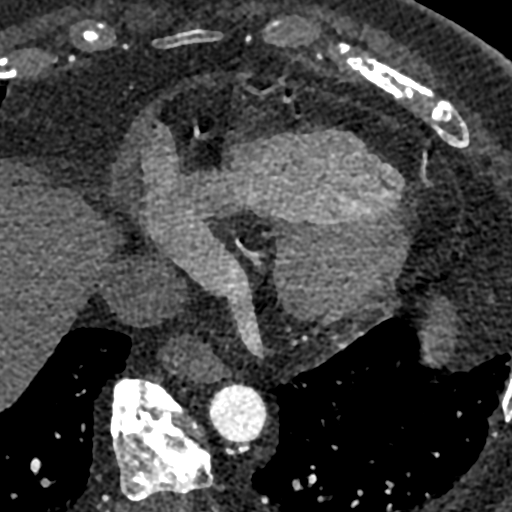
[im 939/3756  lung]
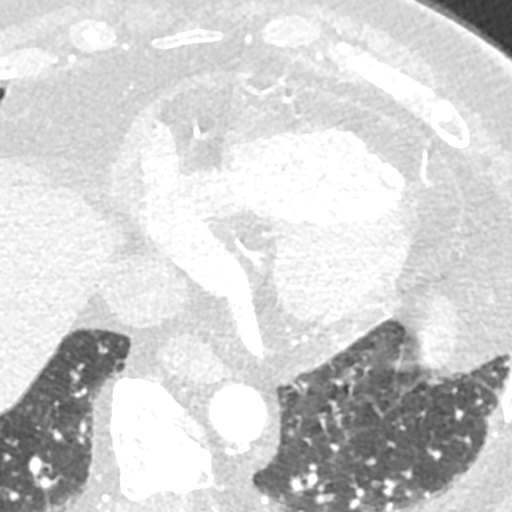
[im 1878/[id number]  vessel]
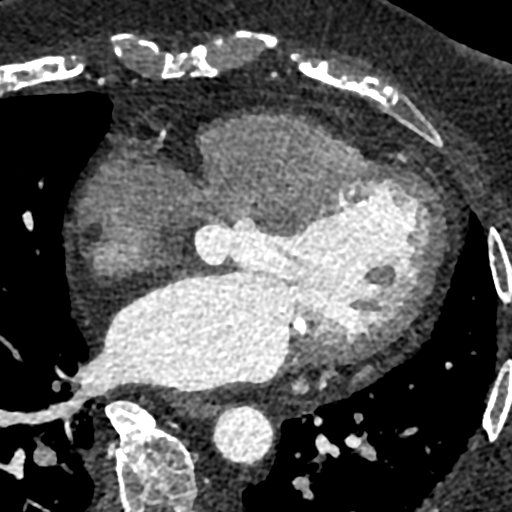
[im 2817/[id number]  vessel]
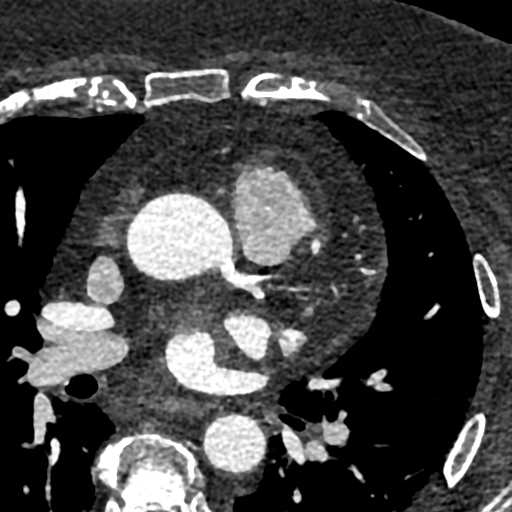

[3 of 20 positions shown; findings below may reference images not displayed]

:
A retrospective scan was triggered in the descending thoracic aorta.
Axial non-contrast 3 mm slices were carried out through the heart.
The data set was analyzed on a dedicated work station and scored
using the Agatson method. Gantry rotation speed was 330 msecs and
collimation was .6 mm. 25mg of metoprolol and 0.8 mg of sl NTG was
given. The 3D data set was reconstructed in 5% intervals of the
60-95 % of the R-R cycle. Diastolic phases were analyzed on a
dedicated work station using MPR, MIP and VRT modes. The patient
received 100 cc of contrast.
FINDINGS: Aorta: Normal size. Mild aortic root and descending aorta
calcifications. No dissection.

Aortic Valve:  Trileaflet.  No calcifications.

Coronary Arteries:  Normal coronary origin.  Right dominance.

RCA is a dominant artery that gives rise to PDA and PLA. There is
calcified plaque in the proximal RCA causing mild stenosis (25%).

Left main is a large artery that gives rise to LAD and LCX arteries.

LAD has proximal calcified plaque causing moderate stenosis
(50-69%).

LCX is a non-dominant artery that gives rise to four obtuse marginal
branches. There is calcified plaque in the proximal LCx causing
minimal stenosis (<25%).

Other findings:

Normal pulmonary vein drainage into the left atrium.

Normal left atrial appendage without a thrombus.

Normal size of the pulmonary artery.
IMPRESSION: 1. Coronary calcium score of 389. This was 85th percentile for age
and sex matched control.

2. Normal coronary origin with right dominance.

3. Moderate stenosis in the proximal LAD.

4. Minimal LCx disease.

5. CAD-RADS 3. Moderate stenosis. Consider symptom-guided
anti-ischemic pharmacotherapy as well as risk factor modification
per guideline directed care.

6. Additional analysis with CT FFR will be submitted and reported
separately.

EXAM:
OVER-READ INTERPRETATION  CT CHEST

The following report is an over-read performed by radiologist Dr.
Gaminator Tokaj [[email or web address]] on 04/19/2021. This over-read
does not include interpretation of cardiac or coronary anatomy or
pathology. The coronary CTA interpretation by the cardiologist is
attached.
FINDINGS: Vascular: Ascending aorta maximally 3.6 cm on [DATE] versus 3.4 cm
previously. Within normal limits. Normal caliber descending thoracic
aorta with aortic atherosclerosis. No central pulmonary embolism, on
this non-dedicated study. Pulmonary artery enlargement, outflow
tract 3.3 cm

Mediastinum/Nodes: No imaged thoracic adenopathy. Small hiatal
hernia.

Lungs/Pleura: No pleural fluid. Right middle lobe 7 mm pulmonary
nodule is similar on 29/20 and can be presumed benign.

Areas of Peri fissural thickening/nodularity along the left major
fissure, including on [DATE], are consistent with subpleural lymph
nodes.

Upper Abdomen: Incompletely imaged high right hepatic lobe cyst of
3.8 cm. Hypoenhancement within the peripheral spleen is favored to
be due to early phase of contrast.

Musculoskeletal: Lower thoracic spondylosis.
IMPRESSION: 1.  No acute findings in the imaged extracardiac chest.
2. A right middle lobe pulmonary nodule is unchanged compared to
09/13/2019 and can be considered benign.
3.  Aortic Atherosclerosis (09P37-KIT.T).
4. Pulmonary artery enlargement suggests pulmonary arterial
hypertension.
5. Small hiatal hernia

ADDENDUM:
There is significant non calcified plaque in the proximal RCA
causing significant stenosis (>70%)

Recommend cardiac catheterization.  Obtain FFRct as scheduled.

*** End of Addendum ***
Addendum:
:
A retrospective scan was triggered in the descending thoracic aorta.
Axial non-contrast 3 mm slices were carried out through the heart.
The data set was analyzed on a dedicated work station and scored
using the Agatson method. Gantry rotation speed was 330 msecs and
collimation was .6 mm. 25mg of metoprolol and 0.8 mg of sl NTG was
given. The 3D data set was reconstructed in 5% intervals of the
60-95 % of the R-R cycle. Diastolic phases were analyzed on a
dedicated work station using MPR, MIP and VRT modes. The patient
received 100 cc of contrast.
FINDINGS: Aorta: Normal size. Mild aortic root and descending aorta
calcifications. No dissection.

Aortic Valve:  Trileaflet.  No calcifications.

Coronary Arteries:  Normal coronary origin.  Right dominance.

RCA is a dominant artery that gives rise to PDA and PLA. There is
calcified plaque in the proximal RCA causing mild stenosis (25%).

Left main is a large artery that gives rise to LAD and LCX arteries.

LAD has proximal calcified plaque causing moderate stenosis
(50-69%).

LCX is a non-dominant artery that gives rise to four obtuse marginal
branches. There is calcified plaque in the proximal LCx causing
minimal stenosis (<25%).

Other findings:

Normal pulmonary vein drainage into the left atrium.

Normal left atrial appendage without a thrombus.

Normal size of the pulmonary artery.
IMPRESSION: 1. Coronary calcium score of 389. This was 85th percentile for age
and sex matched control.

2. Normal coronary origin with right dominance.

3. Moderate stenosis in the proximal LAD.

4. Minimal LCx disease.

5. CAD-RADS 3. Moderate stenosis. Consider symptom-guided
anti-ischemic pharmacotherapy as well as risk factor modification
per guideline directed care.

6. Additional analysis with CT FFR will be submitted and reported
separately.

EXAM:
OVER-READ INTERPRETATION  CT CHEST

The following report is an over-read performed by radiologist Dr.
Gaminator Tokaj [REDACTED] on 04/19/2021. This over-read
does not include interpretation of cardiac or coronary anatomy or
pathology. The coronary CTA interpretation by the cardiologist is
attached.
FINDINGS: Vascular: Ascending aorta maximally 3.6 cm on [DATE] versus 3.4 cm
previously. Within normal limits. Normal caliber descending thoracic
aorta with aortic atherosclerosis. No central pulmonary embolism, on
this non-dedicated study. Pulmonary artery enlargement, outflow
tract 3.3 cm

Mediastinum/Nodes: No imaged thoracic adenopathy. Small hiatal
hernia.

Lungs/Pleura: No pleural fluid. Right middle lobe 7 mm pulmonary
nodule is similar on 29/20 and can be presumed benign.

Areas of Peri fissural thickening/nodularity along the left major
fissure, including on [DATE], are consistent with subpleural lymph
nodes.

Upper Abdomen: Incompletely imaged high right hepatic lobe cyst of
3.8 cm. Hypoenhancement within the peripheral spleen is favored to
be due to early phase of contrast.

Musculoskeletal: Lower thoracic spondylosis.
IMPRESSION: 1.  No acute findings in the imaged extracardiac chest.
2. A right middle lobe pulmonary nodule is unchanged compared to
09/13/2019 and can be considered benign.
3.  Aortic Atherosclerosis (09P37-KIT.T).
4. Pulmonary artery enlargement suggests pulmonary arterial
hypertension.
5. Small hiatal hernia

*** End of Addendum ***
:
A retrospective scan was triggered in the descending thoracic aorta.
Axial non-contrast 3 mm slices were carried out through the heart.
The data set was analyzed on a dedicated work station and scored
using the Agatson method. Gantry rotation speed was 330 msecs and
collimation was .6 mm. 25mg of metoprolol and 0.8 mg of sl NTG was
given. The 3D data set was reconstructed in 5% intervals of the
60-95 % of the R-R cycle. Diastolic phases were analyzed on a
dedicated work station using MPR, MIP and VRT modes. The patient
received 100 cc of contrast.
FINDINGS: Aorta: Normal size. Mild aortic root and descending aorta
calcifications. No dissection.

Aortic Valve:  Trileaflet.  No calcifications.

Coronary Arteries:  Normal coronary origin.  Right dominance.

RCA is a dominant artery that gives rise to PDA and PLA. There is
calcified plaque in the proximal RCA causing mild stenosis (25%).

Left main is a large artery that gives rise to LAD and LCX arteries.

LAD has proximal calcified plaque causing moderate stenosis
(50-69%).

LCX is a non-dominant artery that gives rise to four obtuse marginal
branches. There is calcified plaque in the proximal LCx causing
minimal stenosis (<25%).

Other findings:

Normal pulmonary vein drainage into the left atrium.

Normal left atrial appendage without a thrombus.

Normal size of the pulmonary artery.
IMPRESSION: 1. Coronary calcium score of 389. This was 85th percentile for age
and sex matched control.

2. Normal coronary origin with right dominance.

3. Moderate stenosis in the proximal LAD.

4. Minimal LCx disease.

5. CAD-RADS 3. Moderate stenosis. Consider symptom-guided
anti-ischemic pharmacotherapy as well as risk factor modification
per guideline directed care.

6. Additional analysis with CT FFR will be submitted and reported
separately.

## 2023-05-29 ENCOUNTER — Other Ambulatory Visit: Payer: Self-pay | Admitting: Internal Medicine

## 2023-06-16 ENCOUNTER — Encounter: Payer: Self-pay | Admitting: Internal Medicine

## 2023-06-18 ENCOUNTER — Ambulatory Visit (INDEPENDENT_AMBULATORY_CARE_PROVIDER_SITE_OTHER): Payer: PPO | Admitting: Internal Medicine

## 2023-06-18 ENCOUNTER — Encounter: Payer: Self-pay | Admitting: Internal Medicine

## 2023-06-18 VITALS — BP 130/80 | HR 69 | Ht 64.0 in | Wt 208.6 lb

## 2023-06-18 DIAGNOSIS — E1169 Type 2 diabetes mellitus with other specified complication: Secondary | ICD-10-CM | POA: Diagnosis not present

## 2023-06-18 DIAGNOSIS — E1140 Type 2 diabetes mellitus with diabetic neuropathy, unspecified: Secondary | ICD-10-CM

## 2023-06-18 DIAGNOSIS — E785 Hyperlipidemia, unspecified: Secondary | ICD-10-CM

## 2023-06-18 DIAGNOSIS — I10 Essential (primary) hypertension: Secondary | ICD-10-CM | POA: Diagnosis not present

## 2023-06-18 DIAGNOSIS — Z1231 Encounter for screening mammogram for malignant neoplasm of breast: Secondary | ICD-10-CM

## 2023-06-18 DIAGNOSIS — Z6841 Body Mass Index (BMI) 40.0 and over, adult: Secondary | ICD-10-CM

## 2023-06-18 DIAGNOSIS — E66813 Obesity, class 3: Secondary | ICD-10-CM | POA: Diagnosis not present

## 2023-06-18 DIAGNOSIS — I4891 Unspecified atrial fibrillation: Secondary | ICD-10-CM

## 2023-06-18 DIAGNOSIS — Z87898 Personal history of other specified conditions: Secondary | ICD-10-CM

## 2023-06-18 DIAGNOSIS — I4892 Unspecified atrial flutter: Secondary | ICD-10-CM | POA: Diagnosis not present

## 2023-06-18 MED ORDER — CARVEDILOL 6.25 MG PO TABS: 6.2500 mg | ORAL_TABLET | Freq: Two times a day (BID) | ORAL | 0 refills | Status: DC

## 2023-06-18 MED ORDER — AZELASTINE HCL 0.1 % NA SOLN: 2.0000 | Freq: Two times a day (BID) | NASAL | 12 refills | Status: DC

## 2023-06-18 MED ORDER — TRAZODONE HCL 50 MG PO TABS: 25.0000 mg | ORAL_TABLET | Freq: Every evening | ORAL | 1 refills | Status: DC | PRN

## 2023-06-18 MED ORDER — ALPRAZOLAM 0.25 MG PO TABS: 0.1250 mg | ORAL_TABLET | Freq: Two times a day (BID) | ORAL | 5 refills | Status: AC | PRN

## 2023-06-18 NOTE — Progress Notes (Signed)
 Subjective:  Patient ID: Kari [name], female    DOB: [birth year]  Age: 78 y.o. MRN: [id number]  CC: The primary encounter diagnosis was Encounter for screening mammogram for malignant neoplasm of breast. Diagnoses of Essential hypertension, Type 2 diabetes mellitus with diabetic neuropathy, without long-term current use of insulin (HCC), Hyperlipidemia associated with type 2 diabetes mellitus (HCC), Class 3 severe obesity without serious comorbidity with body mass index (BMI) of 40.0 to 44.9 in adult, unspecified obesity type [facility]), Atrial fib/flutter, transient (HCC), and H/O epistaxis were also pertinent to this visit.   HPI Kari [name] presents for  Chief Complaint  Patient presents with   Medical Management of Chronic Issues    Follow up on hypertension   1) h/o A fib/flutter:  evaluated in ER Dec 4 for concern for arrhythmia; sinus rhythm x 1.5 hour observed   2)  HTN:  readings at home on a calibrated machine were elevated for the past two weeks  initially 190 systolic during a dental visit  ,  more recently in the 170's systolic while taking OTC cold medications.)Dayquil)   She admits to being aggravated by the difficulty she has in using her blood pressure machine,  and she was  aggravated by the long wait se was subjected to  at the dentist's office .  Taking coreg  6.25 mg bid and losartan  100 mg daily   3) recent nosebleed:  uses nasonex  regularly and takes Eliquis    Outpatient Medications Prior to Visit  Medication Sig Dispense Refill   albuterol  (VENTOLIN  HFA) 108 (90 Base) MCG/ACT inhaler Inhale 2 puffs into the lungs every 6 (six) hours as needed for wheezing or shortness of breath. 8.5 g 03   allopurinol  (ZYLOPRIM ) 300 MG tablet Take 1 tablet (300 mg total) by mouth daily. 90 tablet 3   apixaban  (ELIQUIS ) 5 MG TABS tablet Take 1 tablet (5 mg total) by mouth 2 (two) times daily. 180 tablet 1   atorvastatin  (LIPITOR) 40 MG tablet Take 1 tablet (40 mg total) by mouth daily.  90 tablet 1   betamethasone  valerate ointment (VALISONE ) 0.1 % Apply 1 Application topically 2 (two) times daily. 45 g 0   calcium  carbonate (OS-CAL) 600 MG tablet Take 600 mg by mouth daily.     cetirizine (ZYRTEC) 10 MG tablet Take 10 mg by mouth daily.     Cinnamon 500 MG TABS Take 500 mg by mouth daily.     DENTA 5000 PLUS 1.1 % CREA dental cream Take by mouth.     fluticasone -salmeterol (ADVAIR DISKUS) 250-50 MCG/ACT AEPB TAKE 1 PUFF BY MOUTH TWICE A DAY 180 each 3   furosemide  (LASIX ) 20 MG tablet Take 1 tablet by mouth every other day 45 tablet 2   losartan  (COZAAR ) 100 MG tablet TAKE 1 TABLET BY MOUTH EVERYDAY AT BEDTIME 90 tablet 1   Lutein 20 MG TABS Take 20 mg by mouth daily.     Magnesium  250 MG TABS Take 250 mg by mouth daily.     mirtazapine  (REMERON ) 45 MG tablet TAKE 1 TABLET BY MOUTH AT BEDTIME. 90 tablet 1   mometasone  (NASONEX ) 50 MCG/ACT nasal spray Place 2 sprays into the nose daily. 17 g 3   montelukast  (SINGULAIR ) 10 MG tablet Take 1 tablet (10 mg total) by mouth at bedtime. 90 tablet 3   Multiple Vitamins-Minerals (CENTRUM MINIS WOMEN 50+) TABS Take 1 tablet by mouth daily.     Omega-3 Fatty Acids (FISH OIL) 1000 MG CAPS Take 1,000  mg by mouth daily.     OVER THE COUNTER MEDICATION Place 2 sprays into both nostrils at bedtime. Nasocort nasal spray     pantoprazole  (PROTONIX ) 40 MG tablet Take 1 tablet (40 mg total) by mouth daily. 90 tablet 3   Probiotic Product (PROBIOTIC PO) Take 1 tablet by mouth daily.     triamcinolone  lotion (KENALOG ) 0.1 % Apply 1 application topically 2 (two) times daily as needed.     Turmeric (QC TUMERIC COMPLEX PO) Take 1 capsule by mouth daily.     carvedilol  (COREG ) 6.25 MG tablet TAKE 1 TABLET BY MOUTH TWICE A DAY 180 tablet 0   traZODone  (DESYREL ) 50 MG tablet TAKE 0.5-1 TABLETS (25-50 MG TOTAL) BY MOUTH AT BEDTIME AS NEEDED. FOR SLEEP 90 tablet 1   No facility-administered medications prior to visit.    Review of Systems;  Patient  denies headache, fevers, malaise, unintentional weight loss, skin rash, eye pain, sinus congestion and sinus pain, sore throat, dysphagia,  hemoptysis , cough, dyspnea, wheezing, chest pain, palpitations, orthopnea, edema, abdominal pain, nausea, melena, diarrhea, constipation, flank pain, dysuria, hematuria, urinary  Frequency, nocturia, numbness, tingling, seizures,  Focal weakness, Loss of consciousness,  Tremor, insomnia, depression, anxiety, and suicidal ideation.      Objective:  BP 130/80   Pulse 69   Ht 5' 4 (1.626 m)   Wt 208 lb 9.6 oz (94.6 kg)   SpO2 98%   BMI 35.81 kg/m   BP Readings from Last 3 Encounters:  06/18/23 130/80  04/14/23 (!) 140/80  12/18/22 130/68    Wt Readings from Last 3 Encounters:  06/18/23 208 lb 9.6 oz (94.6 kg)  04/14/23 200 lb (90.7 kg)  12/18/22 199 lb (90.3 kg)    Physical Exam  Lab Results  Component Value Date   HGBA1C 5.4 10/08/2022   HGBA1C 5.4 02/11/2022   HGBA1C 5.4 09/19/2021    Lab Results  Component Value Date   CREATININE 0.84 04/14/2023   CREATININE 0.83 10/28/2022   CREATININE 0.75 10/08/2022    Lab Results  Component Value Date   WBC 7.1 04/14/2023   HGB 13.4 04/14/2023   HCT 38.5 04/14/2023   PLT 188 04/14/2023   GLUCOSE 119 (H) 04/14/2023   CHOL 127 10/08/2022   TRIG 201.0 (H) 10/08/2022   HDL 40.80 10/08/2022   LDLDIRECT 59.0 10/08/2022   LDLCALC 79 02/11/2022   ALT 23 10/28/2022   AST 33 10/28/2022   NA 139 04/14/2023   K 3.7 04/14/2023   CL 105 04/14/2023   CREATININE 0.84 04/14/2023   BUN 11 04/14/2023   CO2 23 04/14/2023   TSH 1.57 10/08/2022   INR 1.2 10/28/2022   HGBA1C 5.4 10/08/2022   MICROALBUR 0.7 02/11/2022    DG Chest Port 1 View Result Date: 04/14/2023 CLINICAL DATA:  Atrial fibrillation. EXAM: PORTABLE CHEST 1 VIEW COMPARISON:  10/28/2022 FINDINGS: Heart size and mediastinal contours are unremarkable. Aortic atherosclerotic calcifications. No signs of pleural effusion or edema.  Visualized osseous structures are unremarkable. IMPRESSION: No active disease. Electronically Signed   By: Waddell Calk M.D.   On: 04/14/2023 07:58    Assessment & Plan:  .Encounter for screening mammogram for malignant neoplasm of breast -     3D Screening Mammogram, Left and Right; Future  Essential hypertension Assessment & Plan: Recent elevations appear to be driven by anxiety  trial of prn alprazolam  prior to BP measurement.  Continue  losartan  100 mg and carvedilol  6.25 mg bid   Orders: -  Microalbumin / creatinine urine ratio; Future -     Comprehensive metabolic panel; Future  Type 2 diabetes mellitus with diabetic neuropathy, without long-term current use of insulin (HCC) -     Microalbumin / creatinine urine ratio; Future -     Hemoglobin A1c; Future -     Comprehensive metabolic panel; Future  Hyperlipidemia associated with type 2 diabetes mellitus (HCC) -     Lipid panel; Future -     LDL cholesterol, direct; Future  Class 3 severe obesity without serious comorbidity with body mass index (BMI) of 40.0 to 44.9 in adult, unspecified obesity type (HCC)  Atrial fib/flutter, transient (HCC)  H/O epistaxis Assessment & Plan: Advised to stop using steroid nasal spray .  Astepro   and oral antihistamine advised    Other orders -     Carvedilol ; Take 1 tablet (6.25 mg total) by mouth 2 (two) times daily.  Dispense: 180 tablet; Refill: 0 -     traZODone  HCl; Take 0.5-1 tablets (25-50 mg total) by mouth at bedtime as needed. for sleep  Dispense: 90 tablet; Refill: 1 -     Azelastine  HCl; Place 2 sprays into both nostrils 2 (two) times daily. Use in each nostril as directed  Dispense: 30 mL; Refill: 12 -     ALPRAZolam ; Take 0.5 tablets (0.125 mg total) by mouth 2 (two) times daily as needed for anxiety.  Dispense: 30 tablet; Refill: 5    Follow-up: Return for follow up diabetes.   Verneita LITTIE Kettering, MD

## 2023-06-18 NOTE — Patient Instructions (Addendum)
 To prevent nosebleeds:  Ok to use zyrtec and/or   zyxal twice daily instead of nasonex  (to avoid nosebleeds)  I am prescribing Astepro  to use as topical  nasal spray ; it is an antihistamine   so it will not cause nosebleeds.  The next time you get congested  use afrin (oxymetazoline) nasal spray for a maximum of 5 days   2)  regarding your blood pressure:  I think your anxiety is driving up your blood pressure and your pulse  Try 1/2 alprazolam  15 to 30 mintutes before you take your  blood pressure reading

## 2023-06-19 DIAGNOSIS — Z87898 Personal history of other specified conditions: Secondary | ICD-10-CM | POA: Insufficient documentation

## 2023-06-19 NOTE — Assessment & Plan Note (Signed)
 Recent elevations appear to be driven by anxiety  trial of prn alprazolam  prior to BP measurement.  Continue  losartan  100 mg and carvedilol  6.25 mg bid

## 2023-06-19 NOTE — Assessment & Plan Note (Signed)
 Advised to stop using steroid nasal spray .  Astepro   and oral antihistamine advised

## 2023-06-26 LAB — HM DIABETES EYE EXAM

## 2023-06-30 ENCOUNTER — Ambulatory Visit: Payer: PPO | Attending: Cardiology | Admitting: Cardiology

## 2023-06-30 ENCOUNTER — Encounter: Payer: Self-pay | Admitting: Cardiology

## 2023-06-30 VITALS — BP 148/72 | HR 61 | Ht 64.0 in | Wt 210.2 lb

## 2023-06-30 DIAGNOSIS — I480 Paroxysmal atrial fibrillation: Secondary | ICD-10-CM | POA: Diagnosis not present

## 2023-06-30 DIAGNOSIS — I10 Essential (primary) hypertension: Secondary | ICD-10-CM | POA: Diagnosis not present

## 2023-06-30 DIAGNOSIS — I2510 Atherosclerotic heart disease of native coronary artery without angina pectoris: Secondary | ICD-10-CM | POA: Diagnosis not present

## 2023-06-30 MED ORDER — AMLODIPINE BESYLATE 5 MG PO TABS: 5.0000 mg | ORAL_TABLET | Freq: Every day | ORAL | 3 refills | Status: DC

## 2023-06-30 MED ORDER — CLONIDINE HCL 0.1 MG PO TABS: 0.1000 mg | ORAL_TABLET | Freq: Every evening | ORAL | 1 refills | Status: DC

## 2023-06-30 NOTE — Progress Notes (Signed)
Cardiology Office Note:    Date:  06/30/2023   ID:  Kari [name], DOB 1945-11-02, MRN [id number]  PCP:  Sherlene Shams, MD   [facility] Providers Cardiologist:  Bryan Lemma, MD     Referring MD: Sherlene Shams, MD   Chief Complaint  Patient presents with   Follow-up    Patient concerned with elevated home bp readings.    History of Present Illness:    Kari [name] is a 78 y.o. female with a hx of CAD s/p PCI to RCA 2022, hypertension, hyperlipidemia, paroxysmal atrial fibrillation presenting with elevated BP.  She states blood pressures have been elevated of late with systolics in the 160s to 170s at home.  Denies chest pain or shortness of breath.  Compliant with current blood pressure medications as prescribed.  Denies any bleeding issues with Eliquis.  Previously did not tolerate nifedipine due to leg edema, HCTZ.  Past Medical History:  Diagnosis Date   Allergy    to cat, dogs, pollen, dust, cigarette smoke   Arthritis    feet   Cataract    removed both eyes   COPD (chronic obstructive pulmonary disease) with chronic bronchitis (HCC)    And moderate persistent asthma; complicated by Restrictive Lung Disease from obesity   Coronary artery disease involving native coronary artery of native heart with other form of angina pectoris (HCC)    a. 04/2021 Cor CTA: Cor Ca2+ 389. Domninant RCA-RPDA<PLA. RCA >70p (abnl CTFFR), LAD 50-69p, LCX Min dz; b. 04/2021 Cath/PCI: LM nl, LAD mild diff dzs, D1/2/3 nl, LCX min irregs, OM1/2/3 nl, RCA 99p (2.75x30 Onyx Frontier DES).   Depression    Diastolic dysfunction    a. 04/2021 Echo: EF 60-65%, GrI DD. Nl RV fxn. Mildly dil LA.   Esophageal dysmotility 04/30/2022   Barium swallow 04/30/2022-also has impingement of tablet at aortic arch, we know from CT angiography that she has mild aortic enlargement and pulmonary outflow tract enlargement   GERD (gastroesophageal reflux disease)    Hx of adenomatous colonic polyps  05/30/2010   Hyperlipidemia    Hypertension    Lymphedema of both lower extremities    No superficial venous reflux noted.  However right-sided saphenofemoral junction reflux noted and left common femoral vein reflux noted.  No DVT or superficial thrombus.   Menopause    Migraines    Miscarriage    X 1   Moderate persistent asthma    Obesity (BMI 30.0-34.9) 03/04/2013   PAF (paroxysmal atrial fibrillation) (HCC)    a. CHA2DS2VASc = 5-->eliquis.   PONV (postoperative nausea and vomiting)     Past Surgical History:  Procedure Laterality Date   CATARACT EXTRACTION, BILATERAL     CESAREAN SECTION     X 2   COLONOSCOPY     CORONARY STENT INTERVENTION N/A 04/30/2021   Procedure: CORONARY STENT INTERVENTION;  Surgeon: Iran Ouch, MD;  Location: [facility] INVASIVE CV LAB;  Service: Cardiovascular;  Proximal RCA 99% => DES PCI with Onyx Frontier 2.75 mm x 30 mm   DENTAL SURGERY  09/2016   EYE SURGERY     eye lid surgery 11/2021   LEFT HEART CATH AND CORONARY ANGIOGRAPHY Left 04/30/2021   Procedure: LEFT HEART CATH AND CORONARY ANGIOGRAPHY;  Surgeon: Iran Ouch, MD;  Location: [facility] INVASIVE CV LAB;  Service: Cardiovascular;; Proximal RCA 99% => DES PCI;  Ony; mild diffuse LAD and minimal diffuse LCx disease.  Moderately elevated EDP (22 mmHg).  EF 55-65 %.  POLYPECTOMY     TRANSTHORACIC ECHOCARDIOGRAM  11/04/2019   a) EF 55 to 60%.  Normal LV size and function.  GR 1 DD.  Unable to assess.  Mild LA dilation.  Normal aortic and mitral valve.  Normal RVP.  (Normal study);; b) 04/2021: Normal LV size and function.  EF 60 to 65%.  No RWMA.  GR 1 DD.Normal RV size and function.  Normal aortic and mitral valves.   TUBAL LIGATION  1980    Current Medications: Current Meds  Medication Sig   albuterol (VENTOLIN HFA) 108 (90 Base) MCG/ACT inhaler Inhale 2 puffs into the lungs every 6 (six) hours as needed for wheezing or shortness of breath.   allopurinol (ZYLOPRIM) 300 MG tablet Take 1  tablet (300 mg total) by mouth daily.   ALPRAZolam (XANAX) 0.25 MG tablet Take 0.5 tablets (0.125 mg total) by mouth 2 (two) times daily as needed for anxiety.   apixaban (ELIQUIS) 5 MG TABS tablet Take 1 tablet (5 mg total) by mouth 2 (two) times daily.   atorvastatin (LIPITOR) 40 MG tablet Take 1 tablet (40 mg total) by mouth daily.   azelastine (ASTELIN) 0.1 % nasal spray Place 2 sprays into both nostrils 2 (two) times daily. Use in each nostril as directed   betamethasone valerate ointment (VALISONE) 0.1 % Apply 1 Application topically 2 (two) times daily.   calcium carbonate (OS-CAL) 600 MG tablet Take 600 mg by mouth daily.   carvedilol (COREG) 6.25 MG tablet Take 1 tablet (6.25 mg total) by mouth 2 (two) times daily.   cetirizine (ZYRTEC) 10 MG tablet Take 10 mg by mouth daily.   Cinnamon 500 MG TABS Take 500 mg by mouth daily.   cloNIDine (CATAPRES) 0.1 MG tablet Take 1 tablet (0.1 mg total) by mouth at bedtime.   DENTA 5000 PLUS 1.1 % CREA dental cream Take by mouth.   fluticasone-salmeterol (ADVAIR DISKUS) 250-50 MCG/ACT AEPB TAKE 1 PUFF BY MOUTH TWICE A DAY   furosemide (LASIX) 20 MG tablet Take 1 tablet by mouth every other day   losartan (COZAAR) 100 MG tablet TAKE 1 TABLET BY MOUTH EVERYDAY AT BEDTIME   Lutein 20 MG TABS Take 20 mg by mouth daily.   Magnesium 250 MG TABS Take 250 mg by mouth daily.   mirtazapine (REMERON) 45 MG tablet TAKE 1 TABLET BY MOUTH AT BEDTIME.   mometasone (NASONEX) 50 MCG/ACT nasal spray Place 2 sprays into the nose daily.   montelukast (SINGULAIR) 10 MG tablet Take 1 tablet (10 mg total) by mouth at bedtime.   Multiple Vitamins-Minerals (CENTRUM MINIS WOMEN 50+) TABS Take 1 tablet by mouth daily.   Omega-3 Fatty Acids (FISH OIL) 1000 MG CAPS Take 1,000 mg by mouth daily.   OVER THE COUNTER MEDICATION Place 2 sprays into both nostrils at bedtime. Nasocort nasal spray   pantoprazole (PROTONIX) 40 MG tablet Take 1 tablet (40 mg total) by mouth daily.    Probiotic Product (PROBIOTIC PO) Take 1 tablet by mouth daily.   traZODone (DESYREL) 50 MG tablet Take 0.5-1 tablets (25-50 mg total) by mouth at bedtime as needed. for sleep   triamcinolone lotion (KENALOG) 0.1 % Apply 1 application topically 2 (two) times daily as needed.   Turmeric (QC TUMERIC COMPLEX PO) Take 1 capsule by mouth daily.   [DISCONTINUED] amLODipine (NORVASC) 5 MG tablet Take 1 tablet (5 mg total) by mouth daily.     Allergies:   Colchicine, Hydrochlorothiazide, and Nifedipine   Social History  Socioeconomic History   Marital status: Married    Spouse name: Not on file   Number of children: Not on file   Years of education: Not on file   Highest education level: 12th grade  Occupational History   Not on file  Tobacco Use   Smoking status: Former    Current packs/day: 0.00    Types: Cigarettes    Quit date: 01/02/1971    Years since quitting: 52.5   Smokeless tobacco: Never  Vaping Use   Vaping status: Never Used  Substance and Sexual Activity   Alcohol use: Not Currently    Comment: occasional- wine gives pt headache only drinks once every 6 months   Drug use: No   Sexual activity: Not Currently  Other Topics Concern   Not on file  Social History Narrative   Married, works full-time for a International aid/development worker   Former smoker no alcohol tobacco or drug use at this time   Social Drivers of Corporate investment banker Strain: Low Risk  (10/04/2022)   Overall Financial Resource Strain (CARDIA)    Difficulty of Paying Living Expenses: Not very hard  Food Insecurity: No Food Insecurity (10/04/2022)   Hunger Vital Sign    Worried About Running Out of Food in the Last Year: Never true    Ran Out of Food in the Last Year: Never true  Transportation Needs: No Transportation Needs (10/04/2022)   PRAPARE - Administrator, Civil Service (Medical): No    Lack of Transportation (Non-Medical): No  Physical Activity: Insufficiently Active (10/04/2022)   Exercise  Vital Sign    Days of Exercise per Week: 3 days    Minutes of Exercise per Session: 30 min  Stress: No Stress Concern Present (10/04/2022)   [employer] of Occupational Health - Occupational Stress Questionnaire    Feeling of Stress : Only a little  Social Connections: Socially Isolated (10/04/2022)   Social Connection and Isolation Panel [NHANES]    Frequency of Communication with Friends and Family: Once a week    Frequency of Social Gatherings with Friends and Family: Once a week    Attends Religious Services: Never    Database administrator or Organizations: No    Attends Engineer, structural: Never    Marital Status: Married     Family History: The patient's family history includes Cancer (age of onset: 5) in her sister; Cervical cancer in her mother; Colon cancer (age of onset: 56) in her sister; Coronary artery disease in her father; Heart Problems in her brother and sister; Heart attack in her paternal grandfather; Heart attack (age of onset: 56) in her father; Heart disease in her father; Myasthenia gravis in her mother; Valvular heart disease in her father. There is no history of Breast cancer, Esophageal cancer, Colon polyps, Rectal cancer, or Stomach cancer.  ROS:   Please see the history of present illness.     All other systems reviewed and are negative.  EKGs/Labs/Other Studies Reviewed:    The following studies were reviewed today:  EKG Interpretation Date/Time:  Monday June 30 2023 11:51:12 EST Ventricular Rate:  61 PR Interval:  188 QRS Duration:  82 QT Interval:  448 QTC Calculation: 450 R Axis:   6  Text Interpretation: Normal sinus rhythm Cannot rule out Anterior infarct (cited on or before 14-Apr-2023) Confirmed by Debbe Odea ([id number]) on 06/30/2023 12:04:08 PM    Recent Labs: 10/08/2022: TSH 1.57 10/28/2022: ALT 23 04/14/2023: BUN 11; Creatinine,  Ser 0.84; Hemoglobin 13.4; Platelets 188; Potassium 3.7; Sodium 139  Recent Lipid  Panel    Component Value Date/Time   CHOL 127 10/08/2022 1628   TRIG 201.0 (H) 10/08/2022 1628   HDL 40.80 10/08/2022 1628   CHOLHDL 3 10/08/2022 1628   VLDL 40.2 (H) 10/08/2022 1628   LDLCALC 79 02/11/2022 1033   LDLCALC 85 09/08/2020 1455   LDLDIRECT 59.0 10/08/2022 1628     Risk Assessment/Calculations:         Physical Exam:    VS:  BP (!) 148/72 (BP Location: Left Arm, Patient Position: Sitting, Cuff Size: Large)   Pulse 61   Ht 5\' 4"  (1.626 m)   Wt 210 lb 3.2 oz (95.3 kg)   SpO2 97%   BMI 36.08 kg/m     Wt Readings from Last 3 Encounters:  06/30/23 210 lb 3.2 oz (95.3 kg)  06/18/23 208 lb 9.6 oz (94.6 kg)  04/14/23 200 lb (90.7 kg)     GEN:  Well nourished, well developed in no acute distress HEENT: Normal NECK: No JVD; No carotid bruits CARDIAC: RRR, no murmurs, rubs, gallops RESPIRATORY:  Clear to auscultation without rales, wheezing or rhonchi  ABDOMEN: Soft, non-tender, non-distended MUSCULOSKELETAL:  No edema; No deformity  SKIN: Warm and dry NEUROLOGIC:  Alert and oriented x 3 PSYCHIATRIC:  Normal affect   ASSESSMENT:    1. Primary hypertension   2. Coronary artery disease involving native coronary artery of native heart without angina pectoris   3. Paroxysmal atrial fibrillation (HCC)    PLAN:    In order of problems listed above:  Hypertension, BP elevated, heart rate 61.  Patient declined lower dose calcium channel blocker due to previous edema with nifedipine.  Start clonidine 0.1 mg nightly.  Continue losartan 100 mg daily, Coreg 6.25 mg twice daily.  Bring home BP machine at follow-up visit. CAD s/p PCI to RCA 2022.  Denies chest pain.  LDL at goal.  Eliquis, Lipitor 40 mg daily. Paroxysmal atrial fibrillation, EKG showing sinus rhythm.  Coreg 6.25 mg twice daily, Eliquis 5 mg twice daily.  Follow-up in 6 weeks for BP check.      Medication Adjustments/Labs and Tests Ordered: Current medicines are reviewed at length with the patient  today.  Concerns regarding medicines are outlined above.  Orders Placed This Encounter  Procedures   EKG 12-Lead   Meds ordered this encounter  Medications   DISCONTD: amLODipine (NORVASC) 5 MG tablet    Sig: Take 1 tablet (5 mg total) by mouth daily.    Dispense:  180 tablet    Refill:  3   cloNIDine (CATAPRES) 0.1 MG tablet    Sig: Take 1 tablet (0.1 mg total) by mouth at bedtime.    Dispense:  60 tablet    Refill:  1    Patient Instructions  Medication Instructions:   START cloNIDine (CATAPRES) 0.1 MG tablet - Take one tablet ( 0.1MG ) by mouth nightly  *If you need a refill on your cardiac medications before your next appointment, please call your pharmacy*   Lab Work:  None Ordered  If you have labs (blood work) drawn today and your tests are completely normal, you will receive your results only by: MyChart Message (if you have MyChart) OR A paper copy in the mail If you have any lab test that is abnormal or we need to change your treatment, we will call you to review the results.   Testing/Procedures:  None Ordered  Follow-Up: At [facility], you and your health needs are our priority.  As part of our continuing mission to provide you with exceptional heart care, we have created designated Provider Care Teams.  These Care Teams include your primary Cardiologist (physician) and Advanced Practice Providers (APPs -  Physician Assistants and Nurse Practitioners) who all work together to provide you with the care you need, when you need it.  We recommend signing up for the patient portal called "MyChart".  Sign up information is provided on this After Visit Summary.  MyChart is used to connect with patients for Virtual Visits (Telemedicine).  Patients are able to view lab/test results, encounter notes, upcoming appointments, etc.  Non-urgent messages can be sent to your provider as well.   To learn more about what you can do with MyChart, go to  [email or web address].    Your next appointment:   4 - 6  week(s)  Provider:   Debbe Odea, MD ONLY   Other Instructions  - Please bring your blood pressure machine to your next visit.      Signed, Debbe Odea, MD  06/30/2023 12:21 PM    [facility]

## 2023-06-30 NOTE — Patient Instructions (Addendum)
Medication Instructions:   START cloNIDine (CATAPRES) 0.1 MG tablet - Take one tablet ( 0.1MG ) by mouth nightly  *If you need a refill on your cardiac medications before your next appointment, please call your pharmacy*   Lab Work:  None Ordered  If you have labs (blood work) drawn today and your tests are completely normal, you will receive your results only by: MyChart Message (if you have MyChart) OR A paper copy in the mail If you have any lab test that is abnormal or we need to change your treatment, we will call you to review the results.   Testing/Procedures:  None Ordered   Follow-Up: At [facility], you and your health needs are our priority.  As part of our continuing mission to provide you with exceptional heart care, we have created designated Provider Care Teams.  These Care Teams include your primary Cardiologist (physician) and Advanced Practice Providers (APPs -  Physician Assistants and Nurse Practitioners) who all work together to provide you with the care you need, when you need it.  We recommend signing up for the patient portal called "MyChart".  Sign up information is provided on this After Visit Summary.  MyChart is used to connect with patients for Virtual Visits (Telemedicine).  Patients are able to view lab/test results, encounter notes, upcoming appointments, etc.  Non-urgent messages can be sent to your provider as well.   To learn more about what you can do with MyChart, go to [email or web address].    Your next appointment:   4 - 6  week(s)  Provider:   Debbe Odea, MD ONLY   Other Instructions  - Please bring your blood pressure machine to your next visit.

## 2023-07-01 ENCOUNTER — Telehealth: Payer: Self-pay | Admitting: Cardiology

## 2023-07-01 NOTE — Telephone Encounter (Signed)
   Pre-operative Risk Assessment    Patient Name: Kari [name]  DOB: Sep 03, 1945 MRN: [id number]   Date of last office visit: 06/30/23 Date of next office visit: 08/19/23  Request for Surgical Clearance    Procedure:  multiple lower infected teeth need extractions   Date of Surgery:  Clearance TBD                                Surgeon:  Tobe Sos Surgeon's Group or Practice Name:  [facility] Phone number:  [phone] Fax number:  [phone]   Type of Clearance Requested:  Both, Pharmacy, hold Eliquis 3days prior    Type of Anesthesia:  Not Indicated   Additional requests/questions:    Signed, Narda Amber   07/01/2023, 10:39 AM

## 2023-07-02 NOTE — Telephone Encounter (Signed)
Patient with diagnosis of afib on Eliquis for anticoagulation.    Procedure: multiple lower infected teeth need extractions  Date of procedure: TBD   CHA2DS2-VASc Score = 6   This indicates a 9.7% annual risk of stroke. The patient's score is based upon: CHF History: 0 HTN History: 1 Diabetes History: 1 Stroke History: 0 Vascular Disease History: 1 Age Score: 2 Gender Score: 1      CrCl 65 ml/min Platelet count 188  Patient does NOT require pre-op antibiotics for dental procedure.  Per office protocol, patient can hold Eliquis for 1 day prior to procedure.    **This guidance is not considered finalized until pre-operative APP has relayed final recommendations.**

## 2023-07-03 NOTE — Telephone Encounter (Signed)
   Patient Name: Kari [name]  DOB: 1945/07/20 MRN: [id number]  Primary Cardiologist: Bryan Lemma, MD  Chart reviewed as part of pre-operative protocol coverage. Given past medical history and time since last visit, based on ACC/AHA guidelines, Kari [name] is at acceptable risk for the planned procedure without further cardiovascular testing.   Patient does NOT require pre-op antibiotics for dental procedure.   Per office protocol, patient can hold Eliquis for 1 day prior to procedure.    The patient was advised that if she develops new symptoms prior to surgery to contact our office to arrange for a follow-up visit, and she verbalized understanding.  I will route this recommendation to the requesting party via Epic fax function and remove from pre-op pool.  Please call with questions.  Joni Reining, NP 07/03/2023, 3:40 PM

## 2023-07-04 ENCOUNTER — Encounter: Payer: Self-pay | Admitting: Internal Medicine

## 2023-07-08 ENCOUNTER — Other Ambulatory Visit: Payer: PPO

## 2023-07-24 ENCOUNTER — Encounter: Payer: Self-pay | Admitting: Internal Medicine

## 2023-07-24 ENCOUNTER — Other Ambulatory Visit: Payer: PPO

## 2023-07-24 DIAGNOSIS — I10 Essential (primary) hypertension: Secondary | ICD-10-CM | POA: Diagnosis not present

## 2023-07-24 DIAGNOSIS — E785 Hyperlipidemia, unspecified: Secondary | ICD-10-CM | POA: Diagnosis not present

## 2023-07-24 DIAGNOSIS — E1140 Type 2 diabetes mellitus with diabetic neuropathy, unspecified: Secondary | ICD-10-CM | POA: Diagnosis not present

## 2023-07-24 DIAGNOSIS — E1169 Type 2 diabetes mellitus with other specified complication: Secondary | ICD-10-CM | POA: Diagnosis not present

## 2023-07-24 LAB — HEMOGLOBIN A1C: Hgb A1c MFr Bld: 5.5 % (ref 4.6–6.5)

## 2023-07-24 LAB — COMPREHENSIVE METABOLIC PANEL
ALT: 27 U/L (ref 0–35)
AST: 36 U/L (ref 0–37)
Albumin: 3.8 g/dL (ref 3.5–5.2)
Alkaline Phosphatase: 90 U/L (ref 39–117)
BUN: 15 mg/dL (ref 6–23)
CO2: 27 meq/L (ref 19–32)
Calcium: 9.3 mg/dL (ref 8.4–10.5)
Chloride: 99 meq/L (ref 96–112)
Creatinine, Ser: 0.86 mg/dL (ref 0.40–1.20)
GFR: 65.87 mL/min (ref >60.00)
Glucose, Bld: 119 mg/dL — ABNORMAL HIGH (ref 70–99)
Potassium: 4.1 meq/L (ref 3.5–5.1)
Sodium: 132 meq/L — ABNORMAL LOW (ref 135–145)
Total Bilirubin: 0.6 mg/dL (ref 0.2–1.2)
Total Protein: 7.2 g/dL (ref 6.0–8.3)

## 2023-07-24 LAB — LIPID PANEL
Cholesterol: 121 mg/dL (ref 0–200)
HDL: 39.7 mg/dL (ref >39.00)
LDL Cholesterol: 50 mg/dL (ref 0–99)
NonHDL: 81.24
Total CHOL/HDL Ratio: 3
Triglycerides: 155 mg/dL — ABNORMAL HIGH (ref 0.0–149.0)
VLDL: 31 mg/dL (ref 0.0–40.0)

## 2023-07-24 LAB — MICROALBUMIN / CREATININE URINE RATIO
Creatinine,U: 61.2 mg/dL
Microalb Creat Ratio: 24.8 mg/g (ref 0.0–30.0)
Microalb, Ur: 1.5 mg/dL (ref 0.0–1.9)

## 2023-07-24 LAB — LDL CHOLESTEROL, DIRECT: Direct LDL: 55 mg/dL

## 2023-08-03 IMAGING — DX DG SHOULDER 2+V*R*
3 series · 3 of 3 positions shown · non-contrast
Comparison: None.

CLINICAL DATA: Right shoulder pain for 2 years.

EXAM:
RIGHT SHOULDER - 2+ VIEW

[shoulder (grashey) ap]
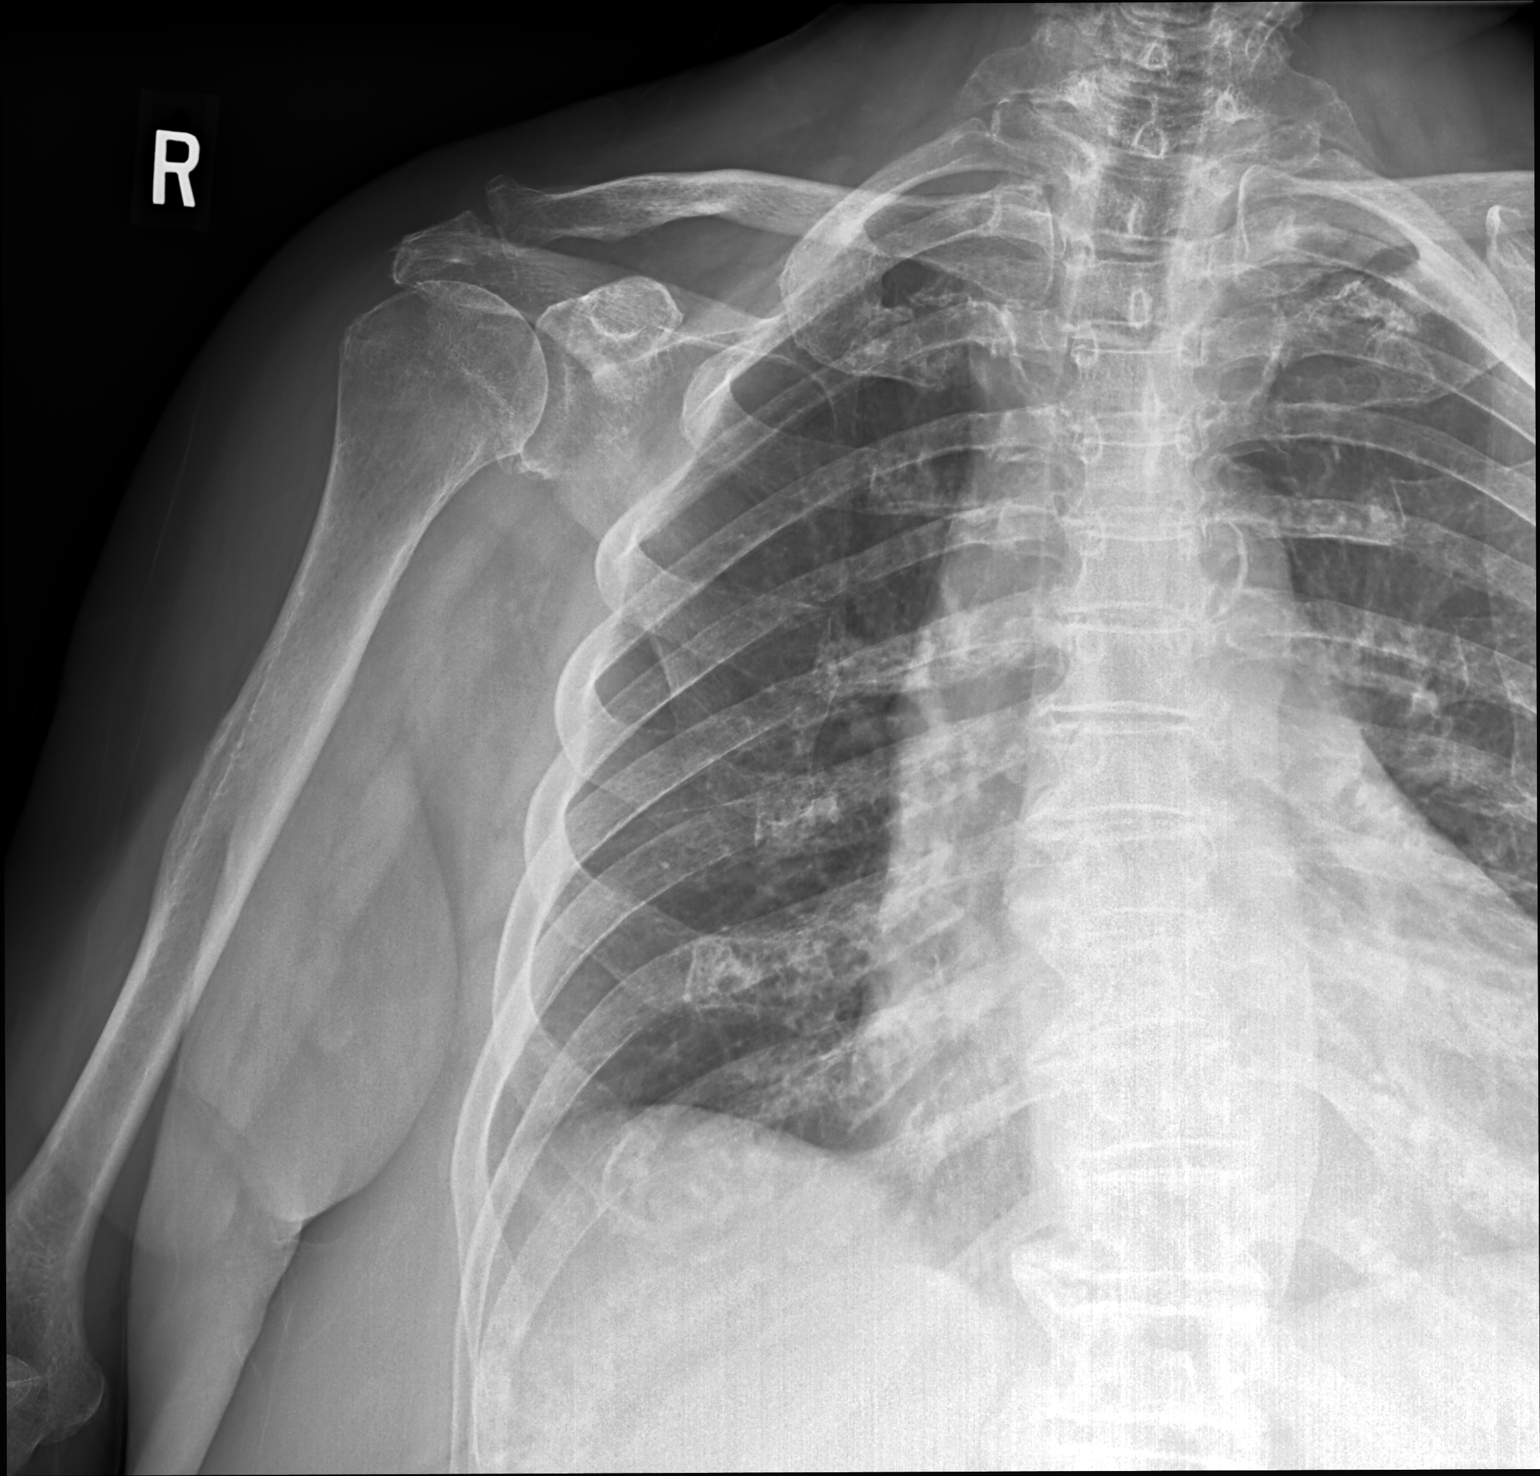

[shoulder y view]
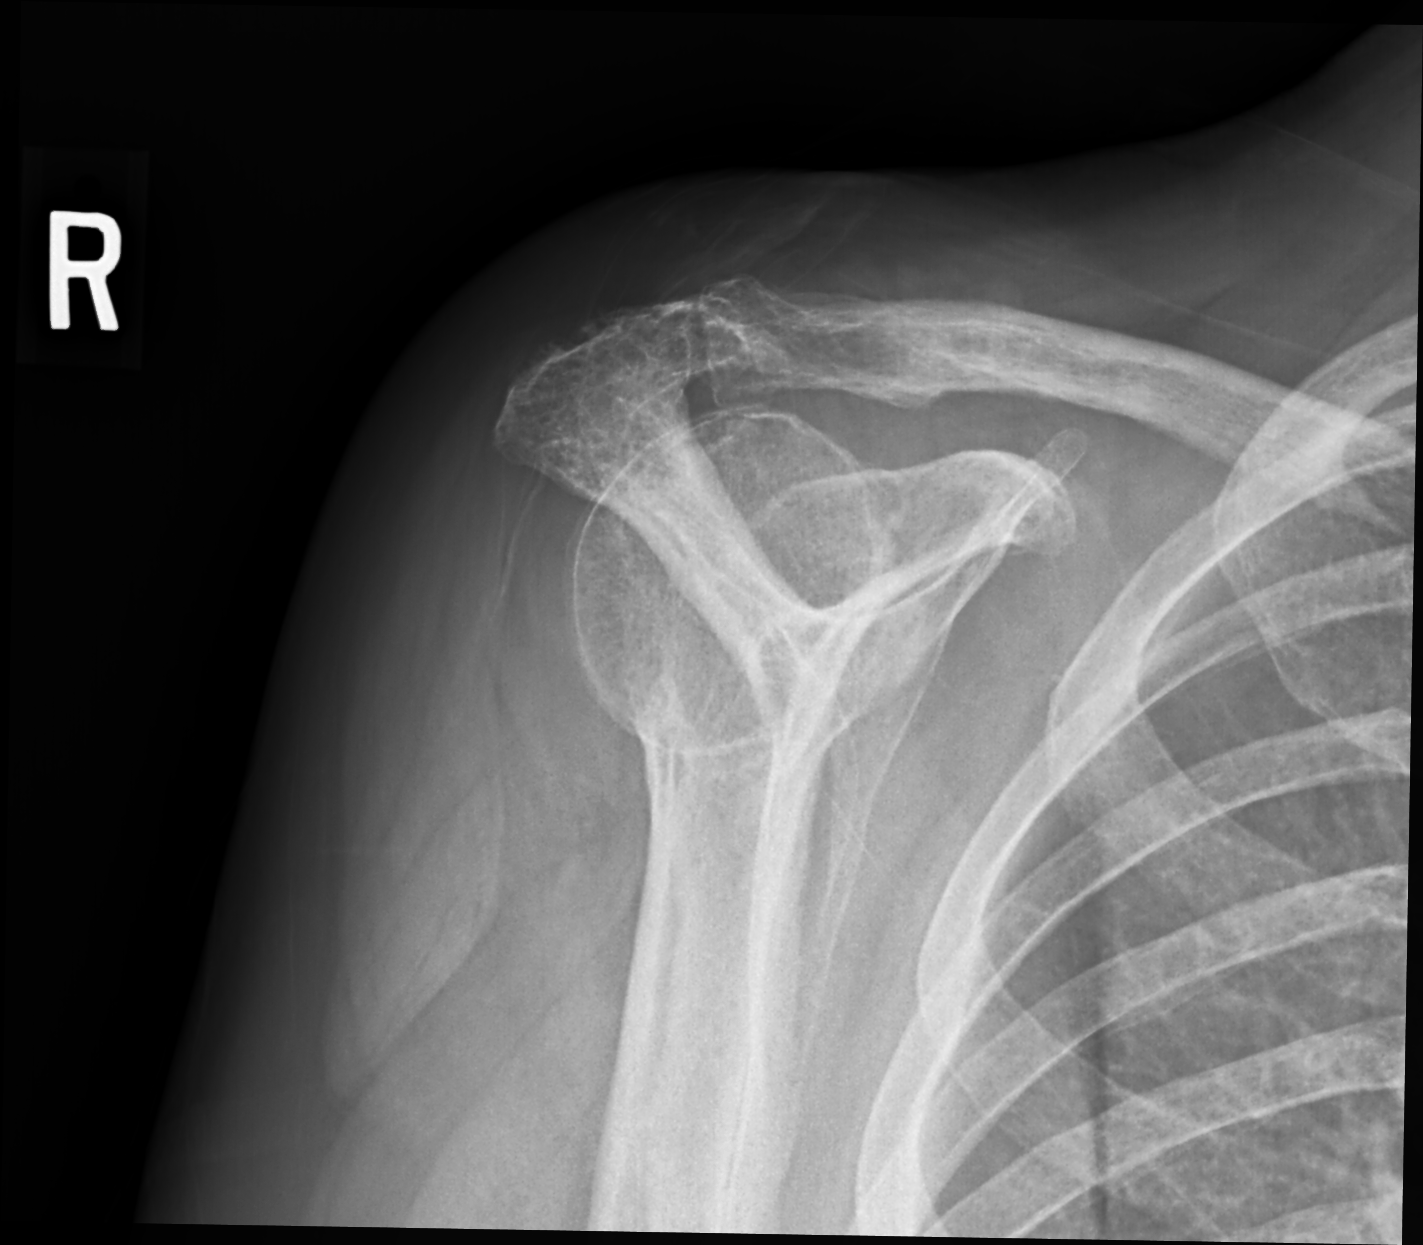

[shoulder axillary pa]
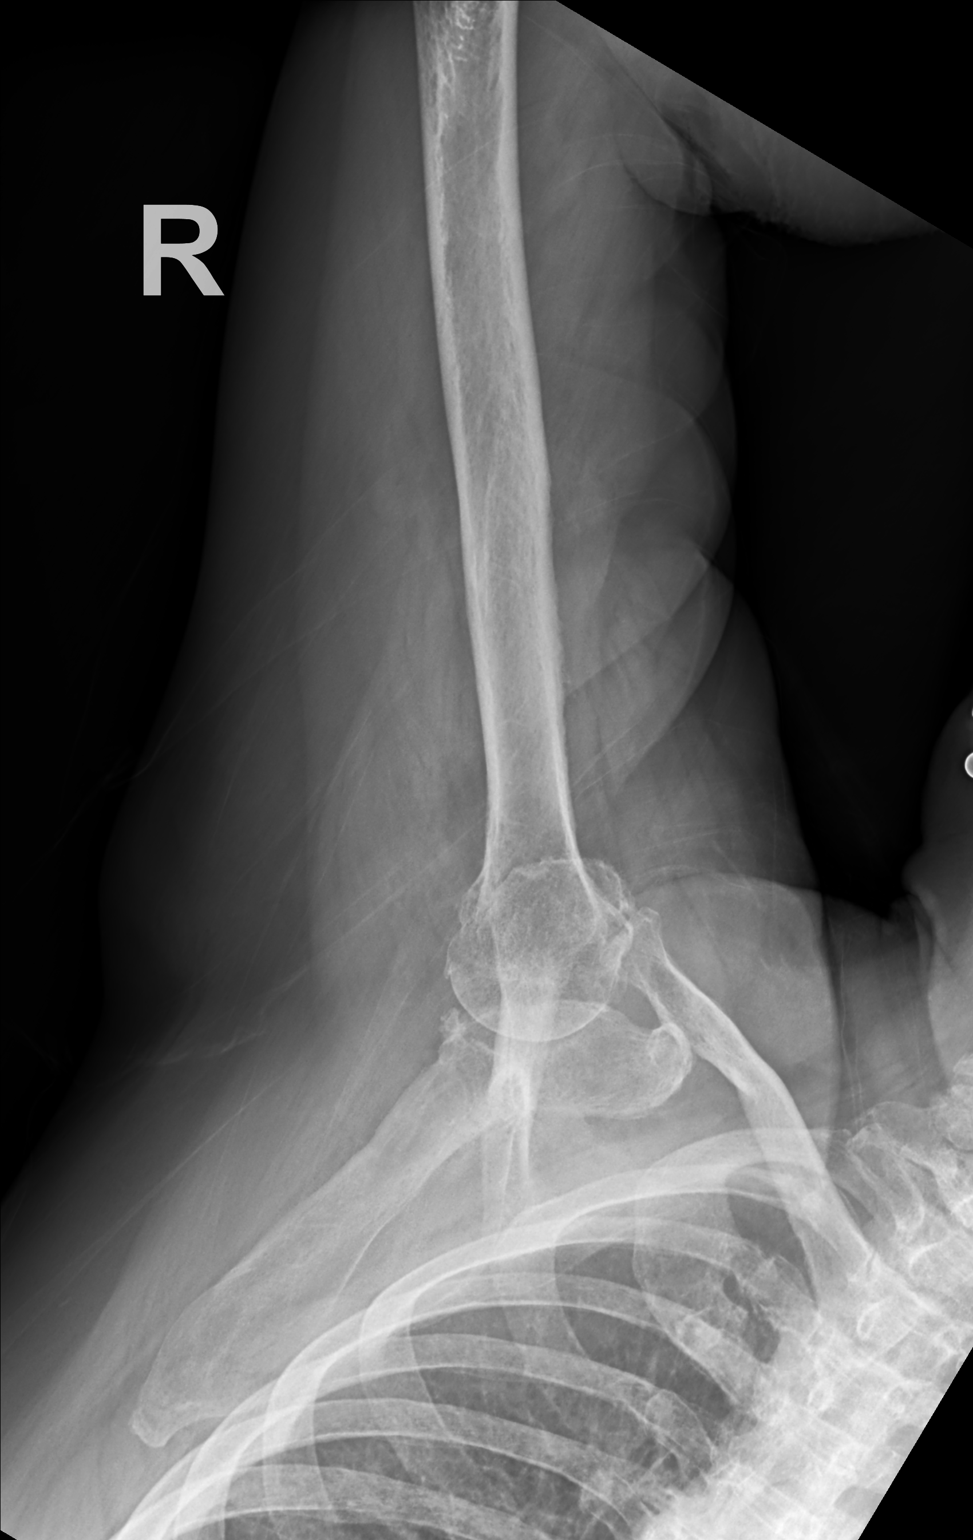

[3 of 3 positions shown; findings below may reference images not displayed]

FINDINGS: Mild inferior and mild-to-moderate posterior glenoid degenerative
osteophytes. Mild glenohumeral joint space narrowing.

Mild acromioclavicular joint space narrowing and peripheral
osteophytosis degenerative change. Moderate distal lateral
subacromial spurring.

Mild-to-moderate multilevel degenerative disc changes of the
thoracic spine.

No acute fracture or dislocation. The visualized portion of the
right lung is unremarkable.
IMPRESSION: Mild-to-moderate glenohumeral and mild acromioclavicular
osteoarthritis.

## 2023-08-07 ENCOUNTER — Ambulatory Visit: Payer: PPO | Admitting: Internal Medicine

## 2023-08-07 ENCOUNTER — Telehealth: Payer: Self-pay

## 2023-08-07 ENCOUNTER — Encounter: Payer: Self-pay | Admitting: Internal Medicine

## 2023-08-07 VITALS — BP 155/80 | HR 73 | Ht 64.0 in | Wt 209.2 lb

## 2023-08-07 DIAGNOSIS — E785 Hyperlipidemia, unspecified: Secondary | ICD-10-CM

## 2023-08-07 DIAGNOSIS — E669 Obesity, unspecified: Secondary | ICD-10-CM

## 2023-08-07 DIAGNOSIS — I700 Atherosclerosis of aorta: Secondary | ICD-10-CM

## 2023-08-07 DIAGNOSIS — E1159 Type 2 diabetes mellitus with other circulatory complications: Secondary | ICD-10-CM | POA: Diagnosis not present

## 2023-08-07 DIAGNOSIS — I10 Essential (primary) hypertension: Secondary | ICD-10-CM | POA: Diagnosis not present

## 2023-08-07 DIAGNOSIS — Z7984 Long term (current) use of oral hypoglycemic drugs: Secondary | ICD-10-CM

## 2023-08-07 DIAGNOSIS — D6869 Other thrombophilia: Secondary | ICD-10-CM | POA: Diagnosis not present

## 2023-08-07 DIAGNOSIS — R911 Solitary pulmonary nodule: Secondary | ICD-10-CM

## 2023-08-07 DIAGNOSIS — I152 Hypertension secondary to endocrine disorders: Secondary | ICD-10-CM

## 2023-08-07 DIAGNOSIS — E1169 Type 2 diabetes mellitus with other specified complication: Secondary | ICD-10-CM | POA: Diagnosis not present

## 2023-08-07 DIAGNOSIS — I480 Paroxysmal atrial fibrillation: Secondary | ICD-10-CM

## 2023-08-07 MED ORDER — CARVEDILOL 6.25 MG PO TABS: 6.2500 mg | ORAL_TABLET | Freq: Two times a day (BID) | ORAL | 1 refills | Status: DC

## 2023-08-07 MED ORDER — AIRSUPRA 90-80 MCG/ACT IN AERO: 1.0000 | INHALATION_SPRAY | Freq: Four times a day (QID) | RESPIRATORY_TRACT | 11 refills | Status: AC | PRN

## 2023-08-07 MED ORDER — ATORVASTATIN CALCIUM 40 MG PO TABS: 40.0000 mg | ORAL_TABLET | Freq: Every day | ORAL | 1 refills | Status: DC

## 2023-08-07 MED ORDER — TIRZEPATIDE 2.5 MG/0.5ML ~~LOC~~ SOAJ: 2.5000 mg | SUBCUTANEOUS | Status: DC

## 2023-08-07 NOTE — Assessment & Plan Note (Signed)
 Not seen on 2024 CT chest with contrast

## 2023-08-07 NOTE — Assessment & Plan Note (Signed)
 Noted on recent CT chest .  She is already taking high potency staitn (atorvastatin 40 mg)

## 2023-08-07 NOTE — Progress Notes (Unsigned)
 Subjective:  Patient ID: Kari [name], female    DOB: [birth year]  Age: 78 y.o. MRN: [id number]  CC: There were no encounter diagnoses.   HPI Kari [name] presents for  Chief Complaint  Patient presents with   Medical Management of Chronic Issues    6 week follow up    Cc:  mouth pain since dental extractions were done.  Mouth ulcers becoming more frequent since she had a partial  denture.  Also taking taking lysine .   Rinsing with peroxol and satl water    1) PAF:  Eliqiuis held recently for tooth extractions,  resumed without incident   2) type 2 DM with obesity:  diet controlled,  but having difficulty losing weight   no contraindications to [employer].   3) HTN:  home readings elevated on arm machine, underestimated by wrist machine . On 3 meds  maxed out.  Defers sleep study repeatedly     no prior Renal artery dopplers either    Outpatient Medications Prior to Visit  Medication Sig Dispense Refill   albuterol (VENTOLIN HFA) 108 (90 Base) MCG/ACT inhaler Inhale 2 puffs into the lungs every 6 (six) hours as needed for wheezing or shortness of breath. 8.5 g 03   allopurinol (ZYLOPRIM) 300 MG tablet Take 1 tablet (300 mg total) by mouth daily. 90 tablet 3   ALPRAZolam (XANAX) 0.25 MG tablet Take 0.5 tablets (0.125 mg total) by mouth 2 (two) times daily as needed for anxiety. 30 tablet 5   apixaban (ELIQUIS) 5 MG TABS tablet Take 1 tablet (5 mg total) by mouth 2 (two) times daily. 180 tablet 1   atorvastatin (LIPITOR) 40 MG tablet Take 1 tablet (40 mg total) by mouth daily. 90 tablet 1   azelastine (ASTELIN) 0.1 % nasal spray Place 2 sprays into both nostrils 2 (two) times daily. Use in each nostril as directed 30 mL 12   betamethasone valerate ointment (VALISONE) 0.1 % Apply 1 Application topically 2 (two) times daily. 45 g 0   calcium carbonate (OS-CAL) 600 MG tablet Take 600 mg by mouth daily.     carvedilol (COREG) 6.25 MG tablet Take 1 tablet (6.25 mg total) by mouth 2 (two)  times daily. 180 tablet 0   cetirizine (ZYRTEC) 10 MG tablet Take 10 mg by mouth daily.     Cinnamon 500 MG TABS Take 500 mg by mouth daily.     cloNIDine (CATAPRES) 0.1 MG tablet Take 1 tablet (0.1 mg total) by mouth at bedtime. 60 tablet 1   DENTA 5000 PLUS 1.1 % CREA dental cream Take by mouth.     fluticasone-salmeterol (ADVAIR DISKUS) 250-50 MCG/ACT AEPB TAKE 1 PUFF BY MOUTH TWICE A DAY 180 each 3   furosemide (LASIX) 20 MG tablet Take 1 tablet by mouth every other day 45 tablet 2   losartan (COZAAR) 100 MG tablet TAKE 1 TABLET BY MOUTH EVERYDAY AT BEDTIME 90 tablet 1   Lutein 20 MG TABS Take 20 mg by mouth daily.     Magnesium 250 MG TABS Take 250 mg by mouth daily.     mirtazapine (REMERON) 45 MG tablet TAKE 1 TABLET BY MOUTH AT BEDTIME. 90 tablet 1   mometasone (NASONEX) 50 MCG/ACT nasal spray Place 2 sprays into the nose daily. 17 g 3   montelukast (SINGULAIR) 10 MG tablet Take 1 tablet (10 mg total) by mouth at bedtime. 90 tablet 3   Multiple Vitamins-Minerals (CENTRUM MINIS WOMEN 50+) TABS Take 1 tablet by  mouth daily.     Omega-3 Fatty Acids (FISH OIL) 1000 MG CAPS Take 1,000 mg by mouth daily.     OVER THE COUNTER MEDICATION Place 2 sprays into both nostrils at bedtime. Nasocort nasal spray     pantoprazole (PROTONIX) 40 MG tablet Take 1 tablet (40 mg total) by mouth daily. 90 tablet 3   Probiotic Product (PROBIOTIC PO) Take 1 tablet by mouth daily.     traZODone (DESYREL) 50 MG tablet Take 0.5-1 tablets (25-50 mg total) by mouth at bedtime as needed. for sleep 90 tablet 1   triamcinolone lotion (KENALOG) 0.1 % Apply 1 application topically 2 (two) times daily as needed.     Turmeric (QC TUMERIC COMPLEX PO) Take 1 capsule by mouth daily.     No facility-administered medications prior to visit.    Review of Systems;  Patient denies headache, fevers, malaise, unintentional weight loss, skin rash, eye pain, sinus congestion and sinus pain, sore throat, dysphagia,  hemoptysis ,  cough, dyspnea, wheezing, chest pain, palpitations, orthopnea, edema, abdominal pain, nausea, melena, diarrhea, constipation, flank pain, dysuria, hematuria, urinary  Frequency, nocturia, numbness, tingling, seizures,  Focal weakness, Loss of consciousness,  Tremor, insomnia, depression, anxiety, and suicidal ideation.      Objective:  BP (!) 155/80   Pulse 73   Ht 5\' 4"  (1.626 m)   Wt 209 lb 3.2 oz (94.9 kg)   SpO2 97%   BMI 35.91 kg/m   BP Readings from Last 3 Encounters:  08/07/23 (!) 155/80  06/30/23 (!) 148/72  06/18/23 130/80    Wt Readings from Last 3 Encounters:  08/07/23 209 lb 3.2 oz (94.9 kg)  06/30/23 210 lb 3.2 oz (95.3 kg)  06/18/23 208 lb 9.6 oz (94.6 kg)    Physical Exam  Lab Results  Component Value Date   HGBA1C 5.5 07/24/2023   HGBA1C 5.4 10/08/2022   HGBA1C 5.4 02/11/2022    Lab Results  Component Value Date   CREATININE 0.86 07/24/2023   CREATININE 0.84 04/14/2023   CREATININE 0.83 10/28/2022    Lab Results  Component Value Date   WBC 7.1 04/14/2023   HGB 13.4 04/14/2023   HCT 38.5 04/14/2023   PLT 188 04/14/2023   GLUCOSE 119 (H) 07/24/2023   CHOL 121 07/24/2023   TRIG 155.0 (H) 07/24/2023   HDL 39.70 07/24/2023   LDLDIRECT 55.0 07/24/2023   LDLCALC 50 07/24/2023   ALT 27 07/24/2023   AST 36 07/24/2023   NA 132 (L) 07/24/2023   K 4.1 07/24/2023   CL 99 07/24/2023   CREATININE 0.86 07/24/2023   BUN 15 07/24/2023   CO2 27 07/24/2023   TSH 1.57 10/08/2022   INR 1.2 10/28/2022   HGBA1C 5.5 07/24/2023   MICROALBUR 1.5 07/24/2023    DG Chest Port 1 View Result Date: 04/14/2023 CLINICAL DATA:  Atrial fibrillation. EXAM: PORTABLE CHEST 1 VIEW COMPARISON:  10/28/2022 FINDINGS: Heart size and mediastinal contours are unremarkable. Aortic atherosclerotic calcifications. No signs of pleural effusion or edema. Visualized osseous structures are unremarkable. IMPRESSION: No active disease. Electronically Signed   By: Signa Kell M.D.   On:  04/14/2023 07:58    Assessment & Plan:  .There are no diagnoses linked to this encounter.   I spent 34 minutes on the day of this face to face encounter reviewing patient's  most recent visit with cardiology,  nephrology,  and neurology,  prior relevant surgical and non surgical procedures, recent  labs and imaging studies, counseling on weight  management,  reviewing the assessment and plan with patient, and post visit ordering and reviewing of  diagnostics and therapeutics with patient  .   Follow-up: No follow-ups on file.   Sherlene Shams, MD

## 2023-08-07 NOTE — Patient Instructions (Addendum)
 Try using Anbesol and take 1000 mg tylenol every 12 hours   Referral to [facility] for renal artery dopplers    I recommend a medication to help you lose weight called Mounjaro. It is for patients with diabetes and obesity. You have been given samples for 4 weeks today   Greggory Keen is a medication that is taken as a weekly subcutaneous injection. It is not insulin.  It  causes your pancreas to increase its  own insulin secretion  And also slows down the emptying of your stomach,  So it decreases your appetite and helps you lose weight. For people with diabetes,  the decreased appetite results in lower blood sugars.   The dose for the first 4 weekly doses is 2.5 mg .  You may have mild nausea on the first or second day but this should resolve.  If not  ,  stop the medication.    As long as you are losing weight,  you can request refills of the same dose you are on .  Only increase the dose to  5.0 mg  after 4 weeks if your weight has plateaued.  Let me know when you need a refill and what dose you want filled

## 2023-08-07 NOTE — Telephone Encounter (Signed)
 Medication Samples have been provided to the patient.  Drug name: Greggory Keen       Strength: 2.5 mg        Qty: 1 box  LOT: [id number]  Exp.Date: 12/03/2024  Dosing instructions: Inject 2.5 mg into skin once weekly.   The patient has been instructed regarding the correct time, dose, and frequency of taking this medication, including desired effects and most common side effects.   Deandria Klute 12:16 PM 08/07/2023

## 2023-08-08 NOTE — Assessment & Plan Note (Signed)
 LDL is at goal < 70 on Lipitor 40 mg daily. No changes today   Lab Results  Component Value Date   CHOL 121 07/24/2023   HDL 39.70 07/24/2023   LDLCALC 50 07/24/2023   LDLDIRECT 55.0 07/24/2023   TRIG 155.0 (H) 07/24/2023   CHOLHDL 3 07/24/2023

## 2023-08-08 NOTE — Assessment & Plan Note (Signed)
Acquired,  Secondary to PAF.  Risk mitigated with Eliquis  

## 2023-08-08 NOTE — Assessment & Plan Note (Signed)
 No breakthrough episodes. Continue Lopressor for rate control and Eliquis for DOAC.  No longer on Plavix, doing well.

## 2023-08-08 NOTE — Assessment & Plan Note (Addendum)
 Elevated despite 3 medications. Referring to [facility] for rule ut of secondary causes (RAS)

## 2023-08-08 NOTE — Assessment & Plan Note (Signed)
 Improved with deitary changes.  However she has been unable to lose weight and has no contrainidcations to [employer] . Patient is up-to-date on eye exams and foot exam is normal despite reports of  mild numbness without skin changes  today. Patient has no microalbuminuria. Patient is tolerating statin therapy for CAD risk reduction and on ACE/ARB for renal protection and hypertension .    Lab Results  Component Value Date   HGBA1C 5.5 07/24/2023   Lab Results  Component Value Date   MICROALBUR 1.5 07/24/2023   MICROALBUR 0.7 02/11/2022

## 2023-08-16 ENCOUNTER — Other Ambulatory Visit: Payer: Self-pay | Admitting: Cardiology

## 2023-08-16 DIAGNOSIS — I480 Paroxysmal atrial fibrillation: Secondary | ICD-10-CM

## 2023-08-18 NOTE — Telephone Encounter (Signed)
 Prescription refill request for Eliquis received. Indication:afib Last office visit:2/25 Scr:0.86  3/25 Age: 78 Weight:94.9  kg  Prescription refilled

## 2023-08-19 ENCOUNTER — Ambulatory Visit: Payer: PPO | Attending: Cardiology | Admitting: Cardiology

## 2023-08-19 ENCOUNTER — Encounter: Payer: Self-pay | Admitting: Cardiology

## 2023-08-19 VITALS — BP 130/77 | HR 66 | Ht 64.0 in | Wt 207.0 lb

## 2023-08-19 DIAGNOSIS — I10 Essential (primary) hypertension: Secondary | ICD-10-CM | POA: Diagnosis not present

## 2023-08-19 DIAGNOSIS — I2510 Atherosclerotic heart disease of native coronary artery without angina pectoris: Secondary | ICD-10-CM | POA: Diagnosis not present

## 2023-08-19 DIAGNOSIS — I480 Paroxysmal atrial fibrillation: Secondary | ICD-10-CM

## 2023-08-19 MED ORDER — CLONIDINE HCL 0.1 MG PO TABS: 0.1000 mg | ORAL_TABLET | Freq: Two times a day (BID) | ORAL | 3 refills | Status: AC

## 2023-08-19 NOTE — Progress Notes (Signed)
 Cardiology Office Note:    Date:  08/19/2023   ID:  Ia Leeb, DOB [birth year], MRN [id number]  PCP:  Sherlene Shams, MD   [facility] Providers Cardiologist:  Debbe Odea, MD     Referring MD: Sherlene Shams, MD   No chief complaint on file.   History of Present Illness:    Kari [name] is a 78 y.o. female with a hx of CAD s/p PCI to RCA 2022, hypertension, hyperlipidemia, paroxysmal atrial fibrillation presenting for follow-up.  Last seen due to symptoms of hypertension, started on clonidine.  Has previous adverse effects to nifedipine, HCTZ.  Takes Coreg, losartan, Lasix 20 mg daily.  Her blood pressures overall have improved since starting clonidine.  Systolics ranging in the 130s to 140s.  Prior notes/testing Previously did not tolerate nifedipine due to leg edema, HCTZ.  Past Medical History:  Diagnosis Date   Allergy    to cat, dogs, pollen, dust, cigarette smoke   Arthritis    feet   Cataract    removed both eyes   COPD (chronic obstructive pulmonary disease) with chronic bronchitis (HCC)    And moderate persistent asthma; complicated by Restrictive Lung Disease from obesity   Coronary artery disease involving native coronary artery of native heart with other form of angina pectoris (HCC)    a. 04/2021 Cor CTA: Cor Ca2+ 389. Domninant RCA-RPDA<PLA. RCA >70p (abnl CTFFR), LAD 50-69p, LCX Min dz; b. 04/2021 Cath/PCI: LM nl, LAD mild diff dzs, D1/2/3 nl, LCX min irregs, OM1/2/3 nl, RCA 99p (2.75x30 Onyx Frontier DES).   Depression    Diastolic dysfunction    a. 04/2021 Echo: EF 60-65%, GrI DD. Nl RV fxn. Mildly dil LA.   Esophageal dysmotility 04/30/2022   Barium swallow 04/30/2022-also has impingement of tablet at aortic arch, we know from CT angiography that she has mild aortic enlargement and pulmonary outflow tract enlargement   GERD (gastroesophageal reflux disease)    Hx of adenomatous colonic polyps 05/30/2010   Hyperlipidemia     Hypertension    Lymphedema of both lower extremities    No superficial venous reflux noted.  However right-sided saphenofemoral junction reflux noted and left common femoral vein reflux noted.  No DVT or superficial thrombus.   Menopause    Migraines    Miscarriage    X 1   Moderate persistent asthma    Obesity (BMI 30.0-34.9) 03/04/2013   PAF (paroxysmal atrial fibrillation) (HCC)    a. CHA2DS2VASc = 5-->eliquis.   PONV (postoperative nausea and vomiting)     Past Surgical History:  Procedure Laterality Date   CATARACT EXTRACTION, BILATERAL     CESAREAN SECTION     X 2   COLONOSCOPY     CORONARY STENT INTERVENTION N/A 04/30/2021   Procedure: CORONARY STENT INTERVENTION;  Surgeon: Iran Ouch, MD;  Location: [facility] INVASIVE CV LAB;  Service: Cardiovascular;  Proximal RCA 99% => DES PCI with Onyx Frontier 2.75 mm x 30 mm   DENTAL SURGERY  09/2016   EYE SURGERY     eye lid surgery 11/2021   LEFT HEART CATH AND CORONARY ANGIOGRAPHY Left 04/30/2021   Procedure: LEFT HEART CATH AND CORONARY ANGIOGRAPHY;  Surgeon: Iran Ouch, MD;  Location: [facility] INVASIVE CV LAB;  Service: Cardiovascular;; Proximal RCA 99% => DES PCI;  Ony; mild diffuse LAD and minimal diffuse LCx disease.  Moderately elevated EDP (22 mmHg).  EF 55-65 %.   POLYPECTOMY     TRANSTHORACIC ECHOCARDIOGRAM  11/04/2019  a) EF 55 to 60%.  Normal LV size and function.  GR 1 DD.  Unable to assess.  Mild LA dilation.  Normal aortic and mitral valve.  Normal RVP.  (Normal study);; b) 04/2021: Normal LV size and function.  EF 60 to 65%.  No RWMA.  GR 1 DD.Normal RV size and function.  Normal aortic and mitral valves.   TUBAL LIGATION  1980    Current Medications: Current Meds  Medication Sig   Albuterol-Budesonide (AIRSUPRA) 90-80 MCG/ACT AERO Inhale 1 puff into the lungs every 6 (six) hours as needed.   allopurinol (ZYLOPRIM) 300 MG tablet Take 1 tablet (300 mg total) by mouth daily.   ALPRAZolam (XANAX) 0.25 MG tablet  Take 0.5 tablets (0.125 mg total) by mouth 2 (two) times daily as needed for anxiety.   atorvastatin (LIPITOR) 40 MG tablet Take 1 tablet (40 mg total) by mouth daily.   azelastine (ASTELIN) 0.1 % nasal spray Place 2 sprays into both nostrils 2 (two) times daily. Use in each nostril as directed   betamethasone valerate ointment (VALISONE) 0.1 % Apply 1 Application topically 2 (two) times daily.   calcium carbonate (OS-CAL) 600 MG tablet Take 600 mg by mouth daily.   carvedilol (COREG) 6.25 MG tablet Take 1 tablet (6.25 mg total) by mouth 2 (two) times daily.   cetirizine (ZYRTEC) 10 MG tablet Take 10 mg by mouth daily.   Cinnamon 500 MG TABS Take 500 mg by mouth daily.   DENTA 5000 PLUS 1.1 % CREA dental cream Take by mouth.   ELIQUIS 5 MG TABS tablet TAKE 1 TABLET BY MOUTH TWICE A DAY   fluticasone-salmeterol (ADVAIR DISKUS) 250-50 MCG/ACT AEPB TAKE 1 PUFF BY MOUTH TWICE A DAY   furosemide (LASIX) 20 MG tablet Take 1 tablet by mouth every other day   losartan (COZAAR) 100 MG tablet TAKE 1 TABLET BY MOUTH EVERYDAY AT BEDTIME   Lutein 20 MG TABS Take 20 mg by mouth daily.   Magnesium 250 MG TABS Take 250 mg by mouth daily.   mirtazapine (REMERON) 45 MG tablet TAKE 1 TABLET BY MOUTH AT BEDTIME.   mometasone (NASONEX) 50 MCG/ACT nasal spray Place 2 sprays into the nose daily.   montelukast (SINGULAIR) 10 MG tablet Take 1 tablet (10 mg total) by mouth at bedtime.   Multiple Vitamins-Minerals (CENTRUM MINIS WOMEN 50+) TABS Take 1 tablet by mouth daily.   Omega-3 Fatty Acids (FISH OIL) 1000 MG CAPS Take 1,000 mg by mouth daily.   OVER THE COUNTER MEDICATION Place 2 sprays into both nostrils at bedtime. Nasocort nasal spray   pantoprazole (PROTONIX) 40 MG tablet Take 1 tablet (40 mg total) by mouth daily.   Probiotic Product (PROBIOTIC PO) Take 1 tablet by mouth daily.   tirzepatide [facility]) 2.5 MG/0.5ML Pen Inject 2.5 mg into the skin once a week.   traZODone (DESYREL) 50 MG tablet Take 0.5-1  tablets (25-50 mg total) by mouth at bedtime as needed. for sleep   triamcinolone lotion (KENALOG) 0.1 % Apply 1 application topically 2 (two) times daily as needed.   Turmeric (QC TUMERIC COMPLEX PO) Take 1 capsule by mouth daily.   [DISCONTINUED] cloNIDine (CATAPRES) 0.1 MG tablet Take 1 tablet (0.1 mg total) by mouth at bedtime.     Allergies:   Colchicine, Hydrochlorothiazide, and Nifedipine   Social History   Socioeconomic History   Marital status: Married    Spouse name: Not on file   Number of children: Not on file  Years of education: Not on file   Highest education level: 12th grade  Occupational History   Not on file  Tobacco Use   Smoking status: Former    Current packs/day: 0.00    Types: Cigarettes    Quit date: 01/02/1971    Years since quitting: 52.6   Smokeless tobacco: Never  Vaping Use   Vaping status: Never Used  Substance and Sexual Activity   Alcohol use: Not Currently    Comment: occasional- wine gives pt headache only drinks once every 6 months   Drug use: No   Sexual activity: Not Currently  Other Topics Concern   Not on file  Social History Narrative   Married, works full-time for a International aid/development worker   Former smoker no alcohol tobacco or drug use at this time   Social Drivers of Corporate investment banker Strain: Low Risk  (08/06/2023)   Overall Financial Resource Strain (CARDIA)    Difficulty of Paying Living Expenses: Not very hard  Food Insecurity: No Food Insecurity (08/06/2023)   Hunger Vital Sign    Worried About Running Out of Food in the Last Year: Never true    Ran Out of Food in the Last Year: Never true  Transportation Needs: No Transportation Needs (08/06/2023)   PRAPARE - Administrator, Civil Service (Medical): No    Lack of Transportation (Non-Medical): No  Physical Activity: Insufficiently Active (08/06/2023)   Exercise Vital Sign    Days of Exercise per Week: 2 days    Minutes of Exercise per Session: 20 min  Stress:  No Stress Concern Present (08/06/2023)   [employer] of Occupational Health - Occupational Stress Questionnaire    Feeling of Stress : Only a little  Social Connections: Moderately Isolated (08/06/2023)   Social Connection and Isolation Panel [NHANES]    Frequency of Communication with Friends and Family: Three times a week    Frequency of Social Gatherings with Friends and Family: Once a week    Attends Religious Services: Never    Database administrator or Organizations: No    Attends Engineer, structural: Never    Marital Status: Married     Family History: The patient's family history includes Cancer (age of onset: 34) in her sister; Cervical cancer in her mother; Colon cancer (age of onset: 44) in her sister; Coronary artery disease in her father; Heart Problems in her brother and sister; Heart attack in her paternal grandfather; Heart attack (age of onset: 54) in her father; Heart disease in her father; Myasthenia gravis in her mother; Valvular heart disease in her father. There is no history of Breast cancer, Esophageal cancer, Colon polyps, Rectal cancer, or Stomach cancer.  ROS:   Please see the history of present illness.     All other systems reviewed and are negative.  EKGs/Labs/Other Studies Reviewed:    The following studies were reviewed today:  EKG Interpretation Date/Time:  Tuesday August 19 2023 10:22:55 EDT Ventricular Rate:  66 PR Interval:  200 QRS Duration:  82 QT Interval:  428 QTC Calculation: 448 R Axis:   -5  Text Interpretation: Normal sinus rhythm Confirmed by Debbe Odea ([id number]) on 08/19/2023 10:31:54 AM    Recent Labs: 10/08/2022: TSH 1.57 04/14/2023: Hemoglobin 13.4; Platelets 188 07/24/2023: ALT 27; BUN 15; Creatinine, Ser 0.86; Potassium 4.1; Sodium 132  Recent Lipid Panel    Component Value Date/Time   CHOL 121 07/24/2023 1007   TRIG 155.0 (H) 07/24/2023 1007  HDL 39.70 07/24/2023 1007   CHOLHDL 3 07/24/2023 1007   VLDL  31.0 07/24/2023 1007   LDLCALC 50 07/24/2023 1007   LDLCALC 85 09/08/2020 1455   LDLDIRECT 55.0 07/24/2023 1007     Risk Assessment/Calculations:         Physical Exam:    VS:  BP 130/77 (BP Location: Left Arm, Patient Position: Sitting, Cuff Size: Normal)   Pulse 66   Ht 5\' 4"  (1.626 m)   Wt 207 lb (93.9 kg)   SpO2 92%   BMI 35.53 kg/m     Wt Readings from Last 3 Encounters:  08/19/23 207 lb (93.9 kg)  08/07/23 209 lb 3.2 oz (94.9 kg)  06/30/23 210 lb 3.2 oz (95.3 kg)     GEN:  Well nourished, well developed in no acute distress HEENT: Normal NECK: No JVD; No carotid bruits CARDIAC: RRR, no murmurs, rubs, gallops RESPIRATORY:  Clear to auscultation without rales, wheezing or rhonchi  ABDOMEN: Soft, non-tender, non-distended MUSCULOSKELETAL:  No edema; No deformity  SKIN: Warm and dry NEUROLOGIC:  Alert and oriented x 3 PSYCHIATRIC:  Normal affect   ASSESSMENT:    1. Primary hypertension   2. Coronary artery disease involving native coronary artery of native heart without angina pectoris   3. Paroxysmal atrial fibrillation (HCC)    PLAN:    In order of problems listed above:  Hypertension, BP elevated, heart rate 61.  History of edema with nifedipine, severe hyponatremia with HCTZ.  Started on clonidine 0.1 mg nightly with good effect, increase clonidine to 0.1 mg twice daily.  Continue losartan 100 mg daily, Coreg 6.25 mg twice daily.   CAD s/p PCI to RCA 2022.  Denies chest pain.  Continue Eliquis, Lipitor 40 mg daily. Paroxysmal atrial fibrillation, EKG showing sinus rhythm.  Coreg 6.25 mg twice daily, Eliquis 5 mg twice daily.  Follow-up in 3 months.      Medication Adjustments/Labs and Tests Ordered: Current medicines are reviewed at length with the patient today.  Concerns regarding medicines are outlined above.  Orders Placed This Encounter  Procedures   EKG 12-Lead   Meds ordered this encounter  Medications   cloNIDine (CATAPRES) 0.1 MG tablet     Sig: Take 1 tablet (0.1 mg total) by mouth 2 (two) times daily.    Dispense:  180 tablet    Refill:  3    Patient Instructions  Medication Instructions:  Your physician recommends the following medication changes.  INCREASE: Clonidine 0.1 mg tablet to 2 tablets daily. Once in the morning and once at night.    *If you need a refill on your cardiac medications before your next appointment, please call your pharmacy*  Lab Work:  No labs ordered today.   Testing/Procedures:  No test ordered today.   Follow-Up: At [facility], you and your health needs are our priority.  As part of our continuing mission to provide you with exceptional heart care, our providers are all part of one team.  This team includes your primary Cardiologist (physician) and Advanced Practice Providers or APPs (Physician Assistants and Nurse Practitioners) who all work together to provide you with the care you need, when you need it.  Your next appointment:   3 month(s)  Provider:   You may see Dr. Azucena Cecil or one of the following Advanced Practice Providers on your designated Care Team:   Nicolasa Ducking, NP Ames Dura, PA-C Eula Listen, PA-C Cadence Glidden, PA-C Charlsie Quest, NP Carlos Levering, NP  We recommend signing up for the patient portal called "MyChart".  Sign up information is provided on this After Visit Summary.  MyChart is used to connect with patients for Virtual Visits (Telemedicine).  Patients are able to view lab/test results, encounter notes, upcoming appointments, etc.  Non-urgent messages can be sent to your provider as well.   To learn more about what you can do with MyChart, go to [email or web address].            Signed, Debbe Odea, MD  08/19/2023 12:21 PM    [facility]

## 2023-08-19 NOTE — Patient Instructions (Addendum)
 Medication Instructions:  Your physician recommends the following medication changes.  INCREASE: Clonidine 0.1 mg tablet to 2 tablets daily. Once in the morning and once at night.    *If you need a refill on your cardiac medications before your next appointment, please call your pharmacy*  Lab Work:  No labs ordered today.   Testing/Procedures:  No test ordered today.   Follow-Up: At [facility], you and your health needs are our priority.  As part of our continuing mission to provide you with exceptional heart care, our providers are all part of one team.  This team includes your primary Cardiologist (physician) and Advanced Practice Providers or APPs (Physician Assistants and Nurse Practitioners) who all work together to provide you with the care you need, when you need it.  Your next appointment:   3 month(s)  Provider:   You may see Dr. Azucena Cecil or one of the following Advanced Practice Providers on your designated Care Team:   Nicolasa Ducking, NP Ames Dura, PA-C Eula Listen, PA-C Cadence Kildeer, PA-C Charlsie Quest, NP Carlos Levering, NP    We recommend signing up for the patient portal called "MyChart".  Sign up information is provided on this After Visit Summary.  MyChart is used to connect with patients for Virtual Visits (Telemedicine).  Patients are able to view lab/test results, encounter notes, upcoming appointments, etc.  Non-urgent messages can be sent to your provider as well.   To learn more about what you can do with MyChart, go to [email or web address].

## 2023-08-22 ENCOUNTER — Encounter: Payer: Self-pay | Admitting: Cardiology

## 2023-09-01 ENCOUNTER — Encounter: Payer: Self-pay | Admitting: Internal Medicine

## 2023-09-01 ENCOUNTER — Telehealth: Payer: Self-pay

## 2023-09-01 MED ORDER — TIRZEPATIDE 5 MG/0.5ML ~~LOC~~ SOAJ: 5.0000 mg | SUBCUTANEOUS | 2 refills | Status: DC

## 2023-09-01 MED ORDER — TRAZODONE HCL 50 MG PO TABS: 100.0000 mg | ORAL_TABLET | Freq: Every evening | ORAL | 1 refills | Status: DC | PRN

## 2023-09-01 NOTE — Telephone Encounter (Signed)
 Pharmacy Patient Advocate Encounter   Received notification from CoverMyMeds that prior authorization for Mounjaro 5MG /0.5ML auto-injectors is required/requested.   Insurance verification completed.   The patient is insured through [facility] ADVANTAGE/RX ADVANCE .   Per test claim: PA required; PA submitted to above mentioned insurance via CoverMyMeds Key/confirmation #/EOC [id number] Status is pending

## 2023-09-02 ENCOUNTER — Other Ambulatory Visit (HOSPITAL_COMMUNITY): Payer: Self-pay

## 2023-09-02 NOTE — Telephone Encounter (Signed)
 Pharmacy Patient Advocate Encounter  Received notification from [facility] ADVANTAGE/RX ADVANCE that Prior Authorization for Mounjaro 5MG /0.5ML auto-injectors has been APPROVED from 09/01/23 to 08/31/24. Unable to obtain price due to refill too soon rejection, last fill date 09/01/23 next available fill date06/23/25   PA #/Case ID/Reference #: [id number]

## 2023-09-17 ENCOUNTER — Ambulatory Visit
Admission: RE | Admit: 2023-09-17 | Discharge: 2023-09-17 | Disposition: A | Discharge status: Home / Self Care | Source: Ambulatory Visit | Attending: Internal Medicine | Admitting: Internal Medicine

## 2023-09-17 ENCOUNTER — Encounter (HOSPITAL_COMMUNITY): Payer: Self-pay

## 2023-09-17 DIAGNOSIS — Z1231 Encounter for screening mammogram for malignant neoplasm of breast: Secondary | ICD-10-CM | POA: Diagnosis not present

## 2023-09-18 ENCOUNTER — Other Ambulatory Visit: Payer: Self-pay | Admitting: Internal Medicine

## 2023-09-18 DIAGNOSIS — R928 Other abnormal and inconclusive findings on diagnostic imaging of breast: Secondary | ICD-10-CM

## 2023-09-18 DIAGNOSIS — N6320 Unspecified lump in the left breast, unspecified quadrant: Secondary | ICD-10-CM

## 2023-09-19 ENCOUNTER — Encounter: Payer: Self-pay | Admitting: Internal Medicine

## 2023-09-23 ENCOUNTER — Other Ambulatory Visit: Payer: Self-pay | Admitting: Internal Medicine

## 2023-09-23 ENCOUNTER — Ambulatory Visit
Admission: RE | Admit: 2023-09-23 | Discharge: 2023-09-23 | Disposition: A | Discharge status: Home / Self Care | Source: Ambulatory Visit | Attending: Internal Medicine | Admitting: Internal Medicine

## 2023-09-23 DIAGNOSIS — R92322 Mammographic fibroglandular density, left breast: Secondary | ICD-10-CM | POA: Diagnosis not present

## 2023-09-23 DIAGNOSIS — N63 Unspecified lump in unspecified breast: Secondary | ICD-10-CM

## 2023-09-23 DIAGNOSIS — R928 Other abnormal and inconclusive findings on diagnostic imaging of breast: Secondary | ICD-10-CM

## 2023-09-24 ENCOUNTER — Other Ambulatory Visit (INDEPENDENT_AMBULATORY_CARE_PROVIDER_SITE_OTHER): Payer: Self-pay | Admitting: Nurse Practitioner

## 2023-09-24 ENCOUNTER — Ambulatory Visit: Payer: PPO | Admitting: *Deleted

## 2023-09-24 VITALS — Ht 64.0 in | Wt 198.0 lb

## 2023-09-24 DIAGNOSIS — I10 Essential (primary) hypertension: Secondary | ICD-10-CM

## 2023-09-24 DIAGNOSIS — Z0000 Encounter for general adult medical examination without abnormal findings: Secondary | ICD-10-CM | POA: Diagnosis not present

## 2023-09-24 DIAGNOSIS — N6320 Unspecified lump in the left breast, unspecified quadrant: Secondary | ICD-10-CM | POA: Insufficient documentation

## 2023-09-24 NOTE — Progress Notes (Signed)
 Subjective:   Kari [name] is a 78 y.o. who presents for a Medicare Wellness preventive visit.  As a reminder, Annual Wellness Visits don't include a physical exam, and some assessments may be limited, especially if this visit is performed virtually. We may recommend an in-person visit if needed.  Visit Complete: Virtual I connected with  Kari [name] on 09/24/23 by a audio enabled telemedicine application and verified that I am speaking with the correct person using two identifiers.  Patient Location: Home  Provider Location: Home Office  I discussed the limitations of evaluation and management by telemedicine. The patient expressed understanding and agreed to proceed.  Vital Signs: Because this visit was a virtual/telehealth visit, some criteria may be missing or patient reported. Any vitals not documented were not able to be obtained and vitals that have been documented are patient reported.  VideoDeclined- This patient declined Librarian, academic. Therefore the visit was completed with audio only.  Persons Participating in Visit: Patient.  AWV Questionnaire: No: Patient Medicare AWV questionnaire was not completed prior to this visit.  Cardiac Risk Factors include: advanced age (>90men, >48 women);diabetes mellitus;dyslipidemia;hypertension;obesity (BMI >30kg/m2)     Objective:     Today's Vitals   09/24/23 1010  Weight: 198 lb (89.8 kg)  Height: 5\' 4"  (1.626 m)   Body mass index is 33.99 kg/m.     09/24/2023   10:26 AM 04/14/2023    7:30 AM 10/28/2022   12:04 AM 09/05/2022    1:07 PM 08/08/2022    2:20 PM 08/30/2021    9:55 AM 04/30/2021    9:00 PM  Advanced Directives  Does Patient Have a Medical Advance Directive? Yes No No Yes Yes Yes No  Type of Estate agent of [place];Living will   Healthcare Power of [place];Living will Living will Living will;Healthcare Power of Attorney   Does patient want to make changes  to medical advance directive?    No - Patient declined  No - Patient declined   Copy of Healthcare Power of Attorney in Chart? No - copy requested   No - copy requested  No - copy requested   Would patient like information on creating a medical advance directive?       No - Patient declined    Current Medications (verified) Outpatient Encounter Medications as of 09/24/2023  Medication Sig   Albuterol -Budesonide (AIRSUPRA ) 90-80 MCG/ACT AERO Inhale 1 puff into the lungs every 6 (six) hours as needed.   allopurinol  (ZYLOPRIM ) 300 MG tablet Take 1 tablet (300 mg total) by mouth daily.   ALPRAZolam  (XANAX ) 0.25 MG tablet Take 0.5 tablets (0.125 mg total) by mouth 2 (two) times daily as needed for anxiety.   atorvastatin  (LIPITOR) 40 MG tablet Take 1 tablet (40 mg total) by mouth daily.   azelastine  (ASTELIN ) 0.1 % nasal spray Place 2 sprays into both nostrils 2 (two) times daily. Use in each nostril as directed   betamethasone  valerate ointment (VALISONE ) 0.1 % Apply 1 Application topically 2 (two) times daily.   calcium  carbonate (OS-CAL) 600 MG tablet Take 600 mg by mouth daily.   carvedilol  (COREG ) 6.25 MG tablet Take 1 tablet (6.25 mg total) by mouth 2 (two) times daily.   cetirizine (ZYRTEC) 10 MG tablet Take 10 mg by mouth daily.   Cinnamon 500 MG TABS Take 500 mg by mouth daily.   cloNIDine  (CATAPRES ) 0.1 MG tablet Take 1 tablet (0.1 mg total) by mouth 2 (two) times daily.  DENTA 5000 PLUS 1.1 % CREA dental cream Take by mouth.   ELIQUIS  5 MG TABS tablet TAKE 1 TABLET BY MOUTH TWICE A DAY   fluticasone -salmeterol (ADVAIR DISKUS) 250-50 MCG/ACT AEPB TAKE 1 PUFF BY MOUTH TWICE A DAY   losartan  (COZAAR ) 100 MG tablet TAKE 1 TABLET BY MOUTH EVERYDAY AT BEDTIME   Lutein 20 MG TABS Take 20 mg by mouth daily.   Magnesium  250 MG TABS Take 250 mg by mouth daily.   mirtazapine  (REMERON ) 45 MG tablet TAKE 1 TABLET BY MOUTH AT BEDTIME.   mometasone  (NASONEX ) 50 MCG/ACT nasal spray Place 2 sprays  into the nose daily.   montelukast  (SINGULAIR ) 10 MG tablet Take 1 tablet (10 mg total) by mouth at bedtime.   Multiple Vitamins-Minerals (CENTRUM MINIS WOMEN 50+) TABS Take 1 tablet by mouth daily.   Omega-3 Fatty Acids (FISH OIL) 1000 MG CAPS Take 1,000 mg by mouth daily.   pantoprazole  (PROTONIX ) 40 MG tablet Take 1 tablet (40 mg total) by mouth daily.   Probiotic Product (PROBIOTIC PO) Take 1 tablet by mouth daily.   tirzepatide (MOUNJARO) 5 MG/0.5ML Pen Inject 5 mg into the skin once a week.   traZODone  (DESYREL ) 50 MG tablet Take 2 tablets (100 mg total) by mouth at bedtime as needed. for sleep   triamcinolone  lotion (KENALOG ) 0.1 % Apply 1 application topically 2 (two) times daily as needed.   Turmeric (QC TUMERIC COMPLEX PO) Take 1 capsule by mouth daily.   furosemide  (LASIX ) 20 MG tablet Take 1 tablet by mouth every other day (Patient not taking: Reported on 09/24/2023)   OVER THE COUNTER MEDICATION Place 2 sprays into both nostrils at bedtime. Nasocort nasal spray (Patient not taking: Reported on 09/24/2023)   No facility-administered encounter medications on file as of 09/24/2023.    Allergies (verified) Colchicine , Hydrochlorothiazide , and Nifedipine   History: Past Medical History:  Diagnosis Date   Allergy    to cat, dogs, pollen, dust, cigarette smoke   Arthritis    feet   Cataract    removed both eyes   COPD (chronic obstructive pulmonary disease) with chronic bronchitis (HCC)    And moderate persistent asthma; complicated by Restrictive Lung Disease from obesity   Coronary artery disease involving native coronary artery of native heart with other form of angina pectoris (HCC)    a. 04/2021 Cor CTA: Cor Ca2+ 389. Domninant RCA-RPDA<PLA. RCA >70p (abnl CTFFR), LAD 50-69p, LCX Min dz; b. 04/2021 Cath/PCI: LM nl, LAD mild diff dzs, D1/2/3 nl, LCX min irregs, OM1/2/3 nl, RCA 99p (2.75x30 Onyx Frontier DES).   Depression    Diastolic dysfunction    a. 04/2021 Echo: EF  60-65%, GrI DD. Nl RV fxn. Mildly dil LA.   Esophageal dysmotility 04/30/2022   Barium swallow 04/30/2022-also has impingement of tablet at aortic arch, we know from CT angiography that she has mild aortic enlargement and pulmonary outflow tract enlargement   GERD (gastroesophageal reflux disease)    Hx of adenomatous colonic polyps 05/30/2010   Hyperlipidemia    Hypertension    Lymphedema of both lower extremities    No superficial venous reflux noted.  However right-sided saphenofemoral junction reflux noted and left common femoral vein reflux noted.  No DVT or superficial thrombus.   Menopause    Migraines    Miscarriage    X 1   Moderate persistent asthma    Obesity (BMI 30.0-34.9) 03/04/2013   PAF (paroxysmal atrial fibrillation) (HCC)    a. CHA2DS2VASc =  5-->eliquis .   PONV (postoperative nausea and vomiting)    Past Surgical History:  Procedure Laterality Date   CATARACT EXTRACTION, BILATERAL     CESAREAN SECTION     X 2   COLONOSCOPY     CORONARY STENT INTERVENTION N/A 04/30/2021   Procedure: CORONARY STENT INTERVENTION;  Surgeon: Wenona Hamilton, MD;  Location: [facility] INVASIVE CV LAB;  Service: Cardiovascular;  Proximal RCA 99% => DES PCI with Onyx Frontier 2.75 mm x 30 mm   DENTAL SURGERY  09/2016   EYE SURGERY     eye lid surgery 11/2021   LEFT HEART CATH AND CORONARY ANGIOGRAPHY Left 04/30/2021   Procedure: LEFT HEART CATH AND CORONARY ANGIOGRAPHY;  Surgeon: Wenona Hamilton, MD;  Location: [facility] INVASIVE CV LAB;  Service: Cardiovascular;; Proximal RCA 99% => DES PCI;  Ony; mild diffuse LAD and minimal diffuse LCx disease.  Moderately elevated EDP (22 mmHg).  EF 55-65 %.   POLYPECTOMY     TRANSTHORACIC ECHOCARDIOGRAM  11/04/2019   a) EF 55 to 60%.  Normal LV size and function.  GR 1 DD.  Unable to assess.  Mild LA dilation.  Normal aortic and mitral valve.  Normal RVP.  (Normal study);; b) 04/2021: Normal LV size and function.  EF 60 to 65%.  No RWMA.  GR 1 DD.Normal RV  size and function.  Normal aortic and mitral valves.   TUBAL LIGATION  1980   Family History  Problem Relation Age of Onset   Myasthenia gravis Mother    Cervical cancer Mother    Heart disease Father    Coronary artery disease Father    Valvular heart disease Father    Heart attack Father 79   Cancer Sister 74       colon   Colon cancer Sister 10   Heart Problems Sister    Heart Problems Brother    Heart attack Paternal Grandfather        53's   Breast cancer Neg Hx    Esophageal cancer Neg Hx    Colon polyps Neg Hx    Rectal cancer Neg Hx    Stomach cancer Neg Hx    Social History   Socioeconomic History   Marital status: Married    Spouse name: Not on file   Number of children: Not on file   Years of education: Not on file   Highest education level: 12th grade  Occupational History   Not on file  Tobacco Use   Smoking status: Former    Current packs/day: 0.00    Types: Cigarettes    Quit date: 01/02/1971    Years since quitting: 52.7   Smokeless tobacco: Never  Vaping Use   Vaping status: Never Used  Substance and Sexual Activity   Alcohol use: Not Currently    Comment: occasional- wine gives pt headache only drinks once every 6 months   Drug use: No   Sexual activity: Not Currently  Other Topics Concern   Not on file  Social History Narrative   Married, works full-time for a International aid/development worker   Former smoker no alcohol tobacco or drug use at this time   Social Drivers of Corporate investment banker Strain: Low Risk  (09/24/2023)   Overall Financial Resource Strain (CARDIA)    Difficulty of Paying Living Expenses: Not hard at all  Food Insecurity: No Food Insecurity (09/24/2023)   Hunger Vital Sign    Worried About Running Out of Food in the Last Year:  Never true    Ran Out of Food in the Last Year: Never true  Transportation Needs: No Transportation Needs (09/24/2023)   PRAPARE - Administrator, Civil Service (Medical): No    Lack of  Transportation (Non-Medical): No  Physical Activity: Insufficiently Active (09/24/2023)   Exercise Vital Sign    Days of Exercise per Week: 2 days    Minutes of Exercise per Session: 20 min  Stress: No Stress Concern Present (09/24/2023)   [employer] of Occupational Health - Occupational Stress Questionnaire    Feeling of Stress : Not at all  Social Connections: Moderately Isolated (09/24/2023)   Social Connection and Isolation Panel [NHANES]    Frequency of Communication with Friends and Family: More than three times a week    Frequency of Social Gatherings with Friends and Family: More than three times a week    Attends Religious Services: Never    Database administrator or Organizations: No    Attends Engineer, structural: Never    Marital Status: Married    Tobacco Counseling Counseling given: Not Answered    Clinical Intake:  Pre-visit preparation completed: Yes  Pain : No/denies pain     BMI - recorded: 33.99 Nutritional Status: BMI > 30  Obese Nutritional Risks: None Diabetes: Yes CBG done?: No Did pt. bring in CBG monitor from home?: No  Lab Results  Component Value Date   HGBA1C 5.5 07/24/2023   HGBA1C 5.4 10/08/2022   HGBA1C 5.4 02/11/2022     How often do you need to have someone help you when you read instructions, pamphlets, or other written materials from your doctor or pharmacy?: 1 - Never  Interpreter Needed?: No  Information entered by :: R. Mohamud Mrozek LPN   Activities of Daily Living     09/24/2023   10:12 AM  In your present state of health, do you have any difficulty performing the following activities:  Hearing? 1  Vision? 0  Comment glasses  Difficulty concentrating or making decisions? 0  Walking or climbing stairs? 0  Dressing or bathing? 0  Doing errands, shopping? 0  Preparing Food and eating ? N  Using the Toilet? N  In the past six months, have you accidently leaked urine? N  Do you have problems with loss of bowel  control? N  Managing your Medications? N  Managing your Finances? N  Housekeeping or managing your Housekeeping? N    Patient Care Team: Thersia Flax, MD as PCP - General (Internal Medicine) Constancia Delton, MD as PCP - Cardiology (Cardiology)  Indicate any recent Medical Services you may have received from other than [facility] providers in the past year (date may be approximate).     Assessment:    This is a routine wellness examination for Kari [name].  Hearing/Vision screen Hearing Screening - Comments:: A little difficulty Vision Screening - Comments:: glasses   Goals Addressed             This Visit's Progress    Patient Stated       Wants to loss weight and exercise more       Depression Screen     09/24/2023   10:20 AM 08/07/2023   11:40 AM 06/18/2023    4:33 PM 10/08/2022    4:04 PM 09/05/2022    1:11 PM 02/11/2022    9:49 AM 08/30/2021    9:54 AM  PHQ 2/9 Scores  PHQ - 2 Score 0 0 0 0 0  0 0  PHQ- 9 Score 7 3 3         Fall Risk     09/24/2023   10:14 AM 08/07/2023   11:40 AM 06/18/2023    4:33 PM 11/07/2022   11:04 AM 10/08/2022    3:48 PM  Fall Risk   Falls in the past year? 1 1 1 1  0  Number falls in past yr: 0 0 0 0 0  Injury with Fall? 1 1 1 1  0  Risk for fall due to : History of fall(s);Impaired balance/gait No Fall Risks History of fall(s) History of fall(s) No Fall Risks  Follow up Falls evaluation completed;Falls prevention discussed Falls evaluation completed Falls evaluation completed Falls evaluation completed Falls evaluation completed    MEDICARE RISK AT HOME:  Medicare Risk at Home Any stairs in or around the home?: Yes If so, are there any without handrails?: No Home free of loose throw rugs in walkways, pet beds, electrical cords, etc?: Yes Adequate lighting in your home to reduce risk of falls?: Yes Life alert?: No Use of a cane, walker or w/c?: No Grab bars in the bathroom?: Yes Shower chair or bench in shower?: Yes Elevated toilet seat  or a handicapped toilet?: Yes  TIMED UP AND GO:  Was the test performed?  No  Cognitive Function: 6CIT completed    05/28/2017   11:35 AM  MMSE - Mini Mental State Exam  Orientation to time 5  Orientation to Place 5  Registration 3  Attention/ Calculation 5  Recall 3  Language- name 2 objects 2  Language- repeat 1  Language- follow 3 step command 3  Language- read & follow direction 1  Write a sentence 1  Copy design 1  Total score 30        09/24/2023   10:26 AM 09/05/2022    1:36 PM 08/20/2019    9:40 AM 06/01/2018   10:36 AM  6CIT Screen  What Year? 0 points 0 points 0 points 0 points  What month? 0 points 0 points 0 points 0 points  What time? 0 points 0 points 0 points 0 points  Count back from 20 0 points 0 points 0 points 0 points  Months in reverse 0 points 0 points 0 points 0 points  Repeat phrase 0 points 0 points 0 points 0 points  Total Score 0 points 0 points 0 points 0 points    Immunizations Immunization History  Administered Date(s) Administered   Fluad Quad(high Dose 65+) 02/28/2020, 02/11/2022   Influenza, High Dose Seasonal PF 02/24/2018, 03/15/2019, 01/17/2023   Influenza,inj,Quad PF,6+ Mos 03/03/2013   Influenza-Unspecified 01/11/2014, 02/13/2015, 02/27/2016, 03/04/2017, 02/25/2021   Moderna Covid-19 Fall Seasonal Vaccine 61yrs & older 03/08/2022, 01/17/2023   Moderna Covid-19 Vaccine Bivalent Booster 75yrs & up 02/25/2021   PFIZER Comirnaty(Gray Top)Covid-19 Tri-Sucrose Vaccine 11/09/2020   PFIZER(Purple Top)SARS-COV-2 Vaccination 07/02/2019, 07/23/2019, 02/28/2020   PNEUMOCOCCAL CONJUGATE-20 09/19/2021   Pneumococcal Conjugate-13 03/03/2013   Pneumococcal Polysaccharide-23 08/14/2015   Respiratory Syncytial Virus Vaccine,Recomb Aduvanted(Arexvy) 03/22/2022   Tdap 03/03/2008, 10/28/2022   Zoster Recombinant(Shingrix) 04/19/2022, 10/18/2022    Screening Tests Health Maintenance  Topic Date Due   COVID-19 Vaccine (8 - Pfizer risk 2024-25  season) 07/17/2023   Medicare Annual Wellness (AWV)  09/05/2023   FOOT EXAM  10/08/2023   INFLUENZA VACCINE  12/12/2023   HEMOGLOBIN A1C  01/24/2024   OPHTHALMOLOGY EXAM  06/25/2024   Diabetic kidney evaluation - eGFR measurement  07/23/2024   Diabetic kidney evaluation -  Urine ACR  07/23/2024   MAMMOGRAM  09/16/2024   Colonoscopy  12/12/2025   DTaP/Tdap/Td (3 - Td or Tdap) 10/27/2032   Pneumonia Vaccine 27+ Years old  Completed   DEXA SCAN  Completed   Hepatitis C Screening  Completed   Zoster Vaccines- Shingrix  Completed   HPV VACCINES  Aged Out   Meningococcal B Vaccine  Aged Out    Health Maintenance  Health Maintenance Due  Topic Date Due   COVID-19 Vaccine (8 - Pfizer risk 2024-25 season) 07/17/2023   Medicare Annual Wellness (AWV)  09/05/2023   Health Maintenance Items Addressed: Patient reminded to update flu and covid vaccines annually.  Additional Screening:  Vision Screening: Recommended annual ophthalmology exams for early detection of glaucoma and other disorders of the eye. Up to date [facility] Screening: Recommended annual dental exams for proper oral hygiene  Community Resource Referral / Chronic Care Management: CRR required this visit?  No   CCM required this visit?  No   Plan:    I have personally reviewed and noted the following in the patient's chart:   Medical and social history Use of alcohol, tobacco or illicit drugs  Current medications and supplements including opioid prescriptions. Patient is not currently taking opioid prescriptions. Functional ability and status Nutritional status Physical activity Advanced directives List of other physicians Hospitalizations, surgeries, and ER visits in previous 12 months Vitals Screenings to include cognitive, depression, and falls Referrals and appointments  In addition, I have reviewed and discussed with patient certain preventive protocols, quality metrics, and best practice  recommendations. A written personalized care plan for preventive services as well as general preventive health recommendations were provided to patient.   Felicitas Horse, LPN   1/61/0960   After Visit Summary: (MyChart) Due to this being a telephonic visit, the after visit summary with patients personalized plan was offered to patient via MyChart   Notes: Nothing significant to report at this time.

## 2023-09-24 NOTE — Patient Instructions (Signed)
 Kari [name] , Thank you for taking time out of your busy schedule to complete your Annual Wellness Visit with me. I enjoyed our conversation and look forward to speaking with you again next year. I, as well as your care team,  appreciate your ongoing commitment to your health goals. Please review the following plan we discussed and let me know if I can assist you in the future. Your Game plan/ To Do List    Referrals: If you haven't heard from the office you've been referred to, please reach out to them at the phone provided.  Remember to update flu and covid vaccines annually. Follow up Visits: Next Medicare AWV with our clinical staff: 09/10/24 @ 10:10   Have you seen your provider in the last 6 months (3 months if uncontrolled diabetes)? Yes Next Office Visit with your provider: 01/26/24  Clinician Recommendations:  Aim for 30 minutes of exercise or brisk walking, 6-8 glasses of water, and 5 servings of fruits and vegetables each day.       This is a list of the screening recommended for you and due dates:  Health Maintenance  Topic Date Due   COVID-19 Vaccine (8 - Pfizer risk 2024-25 season) 07/17/2023   Complete foot exam   10/08/2023   Flu Shot  12/12/2023   Hemoglobin A1C  01/24/2024   Eye exam for diabetics  06/25/2024   Yearly kidney function blood test for diabetes  07/23/2024   Yearly kidney health urinalysis for diabetes  07/23/2024   Mammogram  09/16/2024   Medicare Annual Wellness Visit  09/23/2024   Colon Cancer Screening  12/12/2025   DTaP/Tdap/Td vaccine (3 - Td or Tdap) 10/27/2032   Pneumonia Vaccine  Completed   DEXA scan (bone density measurement)  Completed   Hepatitis C Screening  Completed   Zoster (Shingles) Vaccine  Completed   HPV Vaccine  Aged Out   Meningitis B Vaccine  Aged Out    Advanced directives: (Copy Requested) Please bring a copy of your health care power of attorney and living will to the office to be added to your chart at your convenience. You  can mail to [facility] [address] [place], [place] [address] or email to [email or web address] Advance Care Planning is important because it:  [x]  Makes sure you receive the medical care that is consistent with your values, goals, and preferences  [x]  It provides guidance to your family and loved ones and reduces their decisional burden about whether or not they are making the right decisions based on your wishes.

## 2023-09-25 ENCOUNTER — Ambulatory Visit (INDEPENDENT_AMBULATORY_CARE_PROVIDER_SITE_OTHER): Admitting: Nurse Practitioner

## 2023-09-25 ENCOUNTER — Ambulatory Visit (INDEPENDENT_AMBULATORY_CARE_PROVIDER_SITE_OTHER)

## 2023-09-25 VITALS — BP 147/69 | HR 69 | Resp 16 | Ht 64.0 in | Wt 199.0 lb

## 2023-09-25 DIAGNOSIS — I10 Essential (primary) hypertension: Secondary | ICD-10-CM | POA: Diagnosis not present

## 2023-09-25 DIAGNOSIS — E785 Hyperlipidemia, unspecified: Secondary | ICD-10-CM

## 2023-09-25 DIAGNOSIS — E1169 Type 2 diabetes mellitus with other specified complication: Secondary | ICD-10-CM

## 2023-09-25 DIAGNOSIS — I701 Atherosclerosis of renal artery: Secondary | ICD-10-CM | POA: Diagnosis not present

## 2023-09-29 ENCOUNTER — Encounter (INDEPENDENT_AMBULATORY_CARE_PROVIDER_SITE_OTHER): Payer: Self-pay | Admitting: Nurse Practitioner

## 2023-09-29 NOTE — Progress Notes (Signed)
 Subjective:    Patient ID: Kari [name], female    DOB: 17-Dec-1945, 78 y.o.   MRN: [id number] Chief Complaint  Patient presents with   Venous Insufficiency    The patient is seen for evaluation of hypertension which has been difficult to control.  However the patient has recently had some medication changes and notes her blood pressure tends to be within the 130s and 140s systolic.  This is better than when the 190s she has previously been  The patient does have family history of hypertension.   There is no prior documented abdominal bruit. The patient occasionally has flushing symptoms but denies palpitations. No episodes of syncope. There is no history of headache. There is no history of flash pulmonary edema.  The patient denies a history of renal disease.  No recent shortening of the patient's walking distance or new symptoms consistent with claudication.  No history of rest pain symptoms. No new ulcers or wounds of the lower extremities have occurred.  The patient denies amaurosis fugax or recent TIA symptoms. There are no recent neurological changes noted. There is no history of DVT, PE or superficial thrombophlebitis. No recent episodes of angina or shortness of breath documented.   Duplex ultrasound of the renal arteries demonstrates 40 to 59% stenosis of the right renal artery with no significant stenosis of the left     Review of Systems  All other systems reviewed and are negative.      Objective:    Physical Exam Vitals reviewed.  HENT:     Head: Normocephalic.  Cardiovascular:     Rate and Rhythm: Normal rate.     Pulses: Normal pulses.  Pulmonary:     Effort: Pulmonary effort is normal.  Skin:    General: Skin is warm and dry.  Neurological:     Mental Status: She is alert and oriented to person, place, and time.  Psychiatric:        Mood and Affect: Mood normal.        Behavior: Behavior normal.        Thought Content: Thought content normal.         Judgment: Judgment normal.     BP (!) 147/69 (BP Location: Right Arm, Patient Position: Sitting, Cuff Size: Normal)   Pulse 69   Resp 16   Ht 5\' 4"  (1.626 m)   Wt 199 lb (90.3 kg)   BMI 34.16 kg/m   Past Medical History:  Diagnosis Date   Allergy    to cat, dogs, pollen, dust, cigarette smoke   Arthritis    feet   Cataract    removed both eyes   COPD (chronic obstructive pulmonary disease) with chronic bronchitis (HCC)    And moderate persistent asthma; complicated by Restrictive Lung Disease from obesity   Coronary artery disease involving native coronary artery of native heart with other form of angina pectoris (HCC)    a. 04/2021 Cor CTA: Cor Ca2+ 389. Domninant RCA-RPDA<PLA. RCA >70p (abnl CTFFR), LAD 50-69p, LCX Min dz; b. 04/2021 Cath/PCI: LM nl, LAD mild diff dzs, D1/2/3 nl, LCX min irregs, OM1/2/3 nl, RCA 99p (2.75x30 Onyx Frontier DES).   Depression    Diastolic dysfunction    a. 04/2021 Echo: EF 60-65%, GrI DD. Nl RV fxn. Mildly dil LA.   Esophageal dysmotility 04/30/2022   Barium swallow 04/30/2022-also has impingement of tablet at aortic arch, we know from CT angiography that she has mild aortic enlargement and pulmonary outflow tract enlargement  GERD (gastroesophageal reflux disease)    Hx of adenomatous colonic polyps 05/30/2010   Hyperlipidemia    Hypertension    Lymphedema of both lower extremities    No superficial venous reflux noted.  However right-sided saphenofemoral junction reflux noted and left common femoral vein reflux noted.  No DVT or superficial thrombus.   Menopause    Migraines    Miscarriage    X 1   Moderate persistent asthma    Obesity (BMI 30.0-34.9) 03/04/2013   PAF (paroxysmal atrial fibrillation) (HCC)    a. CHA2DS2VASc = 5-->eliquis .   PONV (postoperative nausea and vomiting)     Social History   Socioeconomic History   Marital status: Married    Spouse name: Not on file   Number of children: Not on file   Years of  education: Not on file   Highest education level: 12th grade  Occupational History   Not on file  Tobacco Use   Smoking status: Former    Current packs/day: 0.00    Types: Cigarettes    Quit date: 01/02/1971    Years since quitting: 52.7   Smokeless tobacco: Never  Vaping Use   Vaping status: Never Used  Substance and Sexual Activity   Alcohol use: Not Currently    Comment: occasional- wine gives pt headache only drinks once every 6 months   Drug use: No   Sexual activity: Not Currently  Other Topics Concern   Not on file  Social History Narrative   Married, works full-time for a International aid/development worker   Former smoker no alcohol tobacco or drug use at this time   Social Drivers of Corporate investment banker Strain: Low Risk  (09/24/2023)   Overall Financial Resource Strain (CARDIA)    Difficulty of Paying Living Expenses: Not hard at all  Food Insecurity: No Food Insecurity (09/24/2023)   Hunger Vital Sign    Worried About Running Out of Food in the Last Year: Never true    Ran Out of Food in the Last Year: Never true  Transportation Needs: No Transportation Needs (09/24/2023)   PRAPARE - Administrator, Civil Service (Medical): No    Lack of Transportation (Non-Medical): No  Physical Activity: Insufficiently Active (09/24/2023)   Exercise Vital Sign    Days of Exercise per Week: 2 days    Minutes of Exercise per Session: 20 min  Stress: No Stress Concern Present (09/24/2023)   [employer] of Occupational Health - Occupational Stress Questionnaire    Feeling of Stress : Not at all  Social Connections: Moderately Isolated (09/24/2023)   Social Connection and Isolation Panel [NHANES]    Frequency of Communication with Friends and Family: More than three times a week    Frequency of Social Gatherings with Friends and Family: More than three times a week    Attends Religious Services: Never    Database administrator or Organizations: No    Attends Banker  Meetings: Never    Marital Status: Married  Catering manager Violence: Not At Risk (09/24/2023)   Humiliation, Afraid, Rape, and Kick questionnaire    Fear of Current or Ex-Partner: No    Emotionally Abused: No    Physically Abused: No    Sexually Abused: No    Past Surgical History:  Procedure Laterality Date   CATARACT EXTRACTION, BILATERAL     CESAREAN SECTION     X 2   COLONOSCOPY     CORONARY STENT INTERVENTION N/A 04/30/2021  Procedure: CORONARY STENT INTERVENTION;  Surgeon: Wenona Hamilton, MD;  Location: [facility];  Service: Cardiovascular;  Proximal RCA 99% => DES PCI with Onyx Frontier 2.75 mm x 30 mm   DENTAL SURGERY  09/2016   EYE SURGERY     eye lid surgery 11/2021   LEFT HEART CATH AND CORONARY ANGIOGRAPHY Left 04/30/2021   Procedure: LEFT HEART CATH AND CORONARY ANGIOGRAPHY;  Surgeon: Wenona Hamilton, MD;  Location: [facility];  Service: Cardiovascular;; Proximal RCA 99% => DES PCI;  Ony; mild diffuse LAD and minimal diffuse LCx disease.  Moderately elevated EDP (22 mmHg).  EF 55-65 %.   POLYPECTOMY     TRANSTHORACIC ECHOCARDIOGRAM  11/04/2019   a) EF 55 to 60%.  Normal LV size and function.  GR 1 DD.  Unable to assess.  Mild LA dilation.  Normal aortic and mitral valve.  Normal RVP.  (Normal study);; b) 04/2021: Normal LV size and function.  EF 60 to 65%.  No RWMA.  GR 1 DD.Normal RV size and function.  Normal aortic and mitral valves.   TUBAL LIGATION  1980    Family History  Problem Relation Age of Onset   Myasthenia gravis Mother    Cervical cancer Mother    Heart disease Father    Coronary artery disease Father    Valvular heart disease Father    Heart attack Father 65   Cancer Sister 72       colon   Colon cancer Sister 72   Heart Problems Sister    Heart Problems Brother    Heart attack Paternal Grandfather        63's   Breast cancer Neg Hx    Esophageal cancer Neg Hx    Colon polyps Neg Hx    Rectal cancer Neg Hx     Stomach cancer Neg Hx     Allergies  Allergen Reactions   Colchicine  Other (See Comments)    C/I with carvedilol    Hydrochlorothiazide  Other (See Comments)    Severe hyponatremia    Nifedipine     REACTION: swollen feet       Latest Ref Rng & Units 04/14/2023    7:31 AM 10/28/2022    1:41 AM 10/08/2022    4:28 PM  CBC  WBC 4.0 - 10.5 K/uL 7.1  10.1  8.9   Hemoglobin 12.0 - 15.0 g/dL 16.1  09.6  04.5   Hematocrit 36.0 - 46.0 % 38.5  33.6  35.6   Platelets 150 - 400 K/uL 188  192  223.0        CMP     Component Value Date/Time   NA 132 (L) 07/24/2023 1007   NA 134 04/11/2021 0913   K 4.1 07/24/2023 1007   CL 99 07/24/2023 1007   CO2 27 07/24/2023 1007   GLUCOSE 119 (H) 07/24/2023 1007   BUN 15 07/24/2023 1007   BUN 11 04/11/2021 0913   CREATININE 0.86 07/24/2023 1007   CREATININE 0.95 (H) 09/08/2020 1455   CALCIUM  9.3 07/24/2023 1007   PROT 7.2 07/24/2023 1007   ALBUMIN 3.8 07/24/2023 1007   AST 36 07/24/2023 1007   ALT 27 07/24/2023 1007   ALKPHOS 90 07/24/2023 1007   BILITOT 0.6 07/24/2023 1007   GFR 65.87 07/24/2023 1007   EGFR 68 04/11/2021 0913   GFRNONAA >60 04/14/2023 0731     No results found.     Assessment & Plan:   1. Renal artery  stenosis (HCC) (Primary) Today the patient studies did show some mild evidence of renal artery stenosis but not significant enough to rise to the level of intervention.  Also the patient's blood pressures are acceptable after some medication changes.  Given his refill it would be in her best interest not to move forward with intervention.  Patient will follow-up in 6 months  2. Essential hypertension Continue antihypertensive medications as already ordered, these medications have been reviewed and there are no changes at this time.  3. Hyperlipidemia associated with type 2 diabetes mellitus (HCC) Continue statin as ordered and reviewed, no changes at this time   Current Outpatient Medications on File Prior to Visit   Medication Sig Dispense Refill   Albuterol -Budesonide (AIRSUPRA ) 90-80 MCG/ACT AERO Inhale 1 puff into the lungs every 6 (six) hours as needed. 5.9 g 11   allopurinol  (ZYLOPRIM ) 300 MG tablet Take 1 tablet (300 mg total) by mouth daily. 90 tablet 3   ALPRAZolam  (XANAX ) 0.25 MG tablet Take 0.5 tablets (0.125 mg total) by mouth 2 (two) times daily as needed for anxiety. 30 tablet 5   amLODipine  (NORVASC ) 5 MG tablet      atorvastatin  (LIPITOR) 40 MG tablet Take 1 tablet (40 mg total) by mouth daily. 90 tablet 1   azelastine  (ASTELIN ) 0.1 % nasal spray Place 2 sprays into both nostrils 2 (two) times daily. Use in each nostril as directed 30 mL 12   betamethasone  valerate ointment (VALISONE ) 0.1 % Apply 1 Application topically 2 (two) times daily. 45 g 0   calcium  carbonate (OS-CAL) 600 MG tablet Take 600 mg by mouth daily.     carvedilol  (COREG ) 6.25 MG tablet Take 1 tablet (6.25 mg total) by mouth 2 (two) times daily. 180 tablet 1   cetirizine (ZYRTEC) 10 MG tablet Take 10 mg by mouth daily.     Cinnamon 500 MG TABS Take 500 mg by mouth daily.     cloNIDine  (CATAPRES ) 0.1 MG tablet Take 1 tablet (0.1 mg total) by mouth 2 (two) times daily. 180 tablet 3   DENTA 5000 PLUS 1.1 % CREA dental cream Take by mouth.     ELIQUIS  5 MG TABS tablet TAKE 1 TABLET BY MOUTH TWICE A DAY 180 tablet 1   fluticasone -salmeterol (ADVAIR DISKUS) 250-50 MCG/ACT AEPB TAKE 1 PUFF BY MOUTH TWICE A DAY 180 each 3   losartan  (COZAAR ) 100 MG tablet TAKE 1 TABLET BY MOUTH EVERYDAY AT BEDTIME 90 tablet 1   Lutein 20 MG TABS Take 20 mg by mouth daily.     Magnesium  250 MG TABS Take 250 mg by mouth daily.     mirtazapine  (REMERON ) 45 MG tablet TAKE 1 TABLET BY MOUTH AT BEDTIME. 90 tablet 1   montelukast  (SINGULAIR ) 10 MG tablet Take 1 tablet (10 mg total) by mouth at bedtime. 90 tablet 3   Multiple Vitamins-Minerals (CENTRUM MINIS WOMEN 50+) TABS Take 1 tablet by mouth daily.     Omega-3 Fatty Acids (FISH OIL) 1000 MG CAPS  Take 1,000 mg by mouth daily.     pantoprazole  (PROTONIX ) 40 MG tablet Take 1 tablet (40 mg total) by mouth daily. 90 tablet 3   Probiotic Product (PROBIOTIC PO) Take 1 tablet by mouth daily.     tirzepatide (MOUNJARO) 5 MG/0.5ML Pen Inject 5 mg into the skin once a week. 6 mL 2   traZODone  (DESYREL ) 50 MG tablet Take 2 tablets (100 mg total) by mouth at bedtime as needed. for sleep 90 tablet  1   triamcinolone  lotion (KENALOG ) 0.1 % Apply 1 application topically 2 (two) times daily as needed.     Turmeric (QC TUMERIC COMPLEX PO) Take 1 capsule by mouth daily.     furosemide  (LASIX ) 20 MG tablet Take 1 tablet by mouth every other day (Patient not taking: Reported on 09/25/2023) 45 tablet 2   mometasone  (NASONEX ) 50 MCG/ACT nasal spray Place 2 sprays into the nose daily. (Patient not taking: Reported on 09/25/2023) 17 g 3   OVER THE COUNTER MEDICATION Place 2 sprays into both nostrils at bedtime. Nasocort nasal spray (Patient not taking: Reported on 09/24/2023)     No current facility-administered medications on file prior to visit.    There are no Patient Instructions on file for this visit. No follow-ups on file.   Jadriel Saxer E Malaisha Silliman, NP

## 2023-09-30 ENCOUNTER — Encounter (INDEPENDENT_AMBULATORY_CARE_PROVIDER_SITE_OTHER): Payer: Self-pay

## 2023-10-07 ENCOUNTER — Ambulatory Visit
Admission: RE | Admit: 2023-10-07 | Discharge: 2023-10-07 | Disposition: A | Discharge status: Home / Self Care | Source: Ambulatory Visit | Attending: Internal Medicine | Admitting: Internal Medicine

## 2023-10-07 DIAGNOSIS — N63 Unspecified lump in unspecified breast: Secondary | ICD-10-CM | POA: Insufficient documentation

## 2023-10-07 DIAGNOSIS — R928 Other abnormal and inconclusive findings on diagnostic imaging of breast: Secondary | ICD-10-CM | POA: Diagnosis not present

## 2023-10-07 DIAGNOSIS — N6325 Unspecified lump in the left breast, overlapping quadrants: Secondary | ICD-10-CM | POA: Diagnosis not present

## 2023-10-07 DIAGNOSIS — N6321 Unspecified lump in the left breast, upper outer quadrant: Secondary | ICD-10-CM | POA: Insufficient documentation

## 2023-10-07 DIAGNOSIS — D242 Benign neoplasm of left breast: Secondary | ICD-10-CM | POA: Insufficient documentation

## 2023-10-07 MED ORDER — LIDOCAINE-EPINEPHRINE 1 %-1:100000 IJ SOLN
8.0000 mL | Freq: Once | INTRAMUSCULAR | Status: AC
  Administered 2023-10-07: 8 mL via INTRADERMAL

## 2023-10-07 MED ORDER — LIDOCAINE 1 % OPTIME INJ - NO CHARGE
3.0000 mL | Freq: Once | INTRAMUSCULAR | Status: AC
  Administered 2023-10-07: 3 mL via INTRADERMAL
  Filled 2023-10-07: qty 4

## 2023-10-08 LAB — SURGICAL PATHOLOGY

## 2023-10-11 ENCOUNTER — Other Ambulatory Visit: Payer: Self-pay | Admitting: Internal Medicine

## 2023-10-14 NOTE — Telephone Encounter (Signed)
 This is a PRN medication is it okay to refill as a 90 supply?

## 2023-11-19 ENCOUNTER — Ambulatory Visit: Attending: Nurse Practitioner | Admitting: Nurse Practitioner

## 2023-11-19 ENCOUNTER — Encounter: Payer: Self-pay | Admitting: Nurse Practitioner

## 2023-11-19 VITALS — BP 120/60 | HR 66 | Ht 64.0 in | Wt 194.2 lb

## 2023-11-19 DIAGNOSIS — G4730 Sleep apnea, unspecified: Secondary | ICD-10-CM | POA: Diagnosis not present

## 2023-11-19 DIAGNOSIS — I480 Paroxysmal atrial fibrillation: Secondary | ICD-10-CM

## 2023-11-19 DIAGNOSIS — I2510 Atherosclerotic heart disease of native coronary artery without angina pectoris: Secondary | ICD-10-CM | POA: Diagnosis not present

## 2023-11-19 DIAGNOSIS — I10 Essential (primary) hypertension: Secondary | ICD-10-CM

## 2023-11-19 DIAGNOSIS — E785 Hyperlipidemia, unspecified: Secondary | ICD-10-CM

## 2023-11-19 DIAGNOSIS — E119 Type 2 diabetes mellitus without complications: Secondary | ICD-10-CM

## 2023-11-19 NOTE — Progress Notes (Signed)
 Office Visit    Patient Name: Kari [name] Date of Encounter: 11/19/2023  Primary Care Provider:  Marylynn Verneita CROME, MD Primary Cardiologist:  Redell Cave, MD  Cardiology APP:  Vivienne Lonni Ingle, NP   Chief Complaint    78 y.o. female with history of coronary artery disease, type 2 diabetes mellitus, diabetic neuropathy, obesity, hyperlipidemia, vitamin D  deficiency, COPD, asthma, GERD, venous stasis, lymphedema, gouty arthritis, osteoarthritis, and paroxysmal atrial fibrillation, who presents for f/u related to CAD and PAF.   Past Medical History   Subjective   Past Medical History:  Diagnosis Date   Allergy    to cat, dogs, pollen, dust, cigarette smoke   Arthritis    feet   Cataract    removed both eyes   COPD (chronic obstructive pulmonary disease) with chronic bronchitis (HCC)    And moderate persistent asthma; complicated by Restrictive Lung Disease from obesity   Coronary artery disease involving native coronary artery of native heart with other form of angina pectoris (HCC)    a. 04/2021 Cor CTA: Cor Ca2+ 389. Domninant RCA-RPDA<PLA. RCA >70p (abnl CTFFR), LAD 50-69p, LCX Min dz; b. 04/2021 Cath/PCI: LM nl, LAD mild diff dzs, D1/2/3 nl, LCX min irregs, OM1/2/3 nl, RCA 99p (2.75x30 Onyx Frontier DES).   Depression    Diastolic dysfunction    a. 04/2021 Echo: EF 60-65%, GrI DD. Nl RV fxn. Mildly dil LA.   Esophageal dysmotility 04/30/2022   Barium swallow 04/30/2022-also has impingement of tablet at aortic arch, we know from CT angiography that she has mild aortic enlargement and pulmonary outflow tract enlargement   GERD (gastroesophageal reflux disease)    Hx of adenomatous colonic polyps 05/30/2010   Hyperlipidemia    Hypertension    Lymphedema of both lower extremities    No superficial venous reflux noted.  However right-sided saphenofemoral junction reflux noted and left common femoral vein reflux noted.  No DVT or superficial thrombus.    Menopause    Migraines    Miscarriage    X 1   Moderate persistent asthma    Obesity (BMI 30.0-34.9) 03/04/2013   PAF (paroxysmal atrial fibrillation) (HCC)    a. CHA2DS2VASc = 5-->eliquis .   PONV (postoperative nausea and vomiting)    Past Surgical History:  Procedure Laterality Date   BREAST BIOPSY Left 10/07/2023   US  LT BREAST BX W LOC DEV 1ST LESION IMG BX SPEC US  GUIDE 10/07/2023 [facility]   CATARACT EXTRACTION, BILATERAL     CESAREAN SECTION     X 2   COLONOSCOPY     CORONARY STENT INTERVENTION N/A 04/30/2021   Procedure: CORONARY STENT INTERVENTION;  Surgeon: Darron Deatrice LABOR, MD;  Location: [facility];  Service: Cardiovascular;  Proximal RCA 99% => DES PCI with Onyx Frontier 2.75 mm x 30 mm   DENTAL SURGERY  09/2016   EYE SURGERY     eye lid surgery 11/2021   LEFT HEART CATH AND CORONARY ANGIOGRAPHY Left 04/30/2021   Procedure: LEFT HEART CATH AND CORONARY ANGIOGRAPHY;  Surgeon: Darron Deatrice LABOR, MD;  Location: [facility];  Service: Cardiovascular;; Proximal RCA 99% => DES PCI;  Ony; mild diffuse LAD and minimal diffuse LCx disease.  Moderately elevated EDP (22 mmHg).  EF 55-65 %.   POLYPECTOMY     TRANSTHORACIC ECHOCARDIOGRAM  11/04/2019   a) EF 55 to 60%.  Normal LV size and function.  GR 1 DD.  Unable to assess.  Mild LA dilation.  Normal aortic  and mitral valve.  Normal RVP.  (Normal study);; b) 04/2021: Normal LV size and function.  EF 60 to 65%.  No RWMA.  GR 1 DD.Normal RV size and function.  Normal aortic and mitral valves.   TUBAL LIGATION  1980    Allergies  Allergies  Allergen Reactions   Colchicine  Other (See Comments)    C/I with carvedilol    Hydrochlorothiazide  Other (See Comments)    Severe hyponatremia    Nifedipine     REACTION: swollen feet       History of Present Illness      78 y.o. y/o female with above past medical history including CAD, paroxysmal atrial fibrillation, type 2 diabetes mellitus, diabetic  neuropathy, obesity, hyperlipidemia, vitamin D  deficiency, COPD, asthma, GERD, venous stasis, lymphedema, gouty arthritis, and osteoporosis.  In late 2022, she was seen in the [facility] ED with palpitations found to be in atrial fibrillation with rapid ventricular response, which responded to IV diltiazem  and she was subsequently placed on oral beta-blocker and Eliquis .  At follow-up, she reported chest pain and dyspnea that occurred during her episode of A-fib, and she underwent coronary CT angiogram in December 2022 revealing severe proximal RCA disease.  She subsequently underwent diagnostic catheterization revealing 99% stenosis in the proximal RCA with otherwise minimal, nonobstructive CAD.  The RCA was successfully treated with a 2.75 x 30 mm Onyx frontier drug-eluting stent.  Postprocedure was complicated by right radial hematoma, which was managed with compression.  Follow-up echocardiogram in late December 2022, showed EF of 60 to 65% with grade 1 diastolic dysfunction.    In the setting of hypertension, she was placed on clonidine  therapy and February 2025, with improvement in blood pressure at April 2025 appointment.   Since her last visit, blood pressure has been much better controlled.  She has also since started Mounjaro  with 12 pound weight loss.  She says that since starting Mounjaro , she has been having fatigue and GERD symptoms have been worse as well.  She says that she sleeps through the night but when she awakens, she is not well rested.  She does not think that she snores.  She denies chest pain, dyspnea, palpitations, PND, orthopnea, dizziness, syncope, edema, or early satiety. Objective   Home Medications    Current Outpatient Medications  Medication Sig Dispense Refill   Albuterol -Budesonide (AIRSUPRA ) 90-80 MCG/ACT AERO Inhale 1 puff into the lungs every 6 (six) hours as needed. 5.9 g 11   allopurinol  (ZYLOPRIM ) 300 MG tablet Take 1 tablet (300 mg total) by mouth daily. 90 tablet 3    ALPRAZolam  (XANAX ) 0.25 MG tablet Take 0.5 tablets (0.125 mg total) by mouth 2 (two) times daily as needed for anxiety. 30 tablet 5   amLODipine  (NORVASC ) 5 MG tablet      atorvastatin  (LIPITOR) 40 MG tablet Take 1 tablet (40 mg total) by mouth daily. 90 tablet 1   azelastine  (ASTELIN ) 0.1 % nasal spray Place 2 sprays into both nostrils 2 (two) times daily. Use in each nostril as directed 30 mL 12   betamethasone  valerate ointment (VALISONE ) 0.1 % Apply 1 Application topically 2 (two) times daily. 45 g 0   calcium  carbonate (OS-CAL) 600 MG tablet Take 600 mg by mouth daily.     carvedilol  (COREG ) 6.25 MG tablet Take 1 tablet (6.25 mg total) by mouth 2 (two) times daily. 180 tablet 1   cetirizine (ZYRTEC) 10 MG tablet Take 10 mg by mouth daily.     Cinnamon 500 MG  TABS Take 500 mg by mouth daily.     cloNIDine  (CATAPRES ) 0.1 MG tablet Take 1 tablet (0.1 mg total) by mouth 2 (two) times daily. 180 tablet 3   DENTA 5000 PLUS 1.1 % CREA dental cream Take by mouth.     ELIQUIS  5 MG TABS tablet TAKE 1 TABLET BY MOUTH TWICE A DAY 180 tablet 1   fluticasone -salmeterol (ADVAIR DISKUS) 250-50 MCG/ACT AEPB TAKE 1 PUFF BY MOUTH TWICE A DAY 180 each 3   furosemide  (LASIX ) 20 MG tablet Take 1 tablet by mouth every other day 45 tablet 2   losartan  (COZAAR ) 100 MG tablet TAKE 1 TABLET BY MOUTH EVERYDAY AT BEDTIME 90 tablet 1   Lutein 20 MG TABS Take 20 mg by mouth daily.     Magnesium  250 MG TABS Take 250 mg by mouth daily.     mirtazapine  (REMERON ) 45 MG tablet TAKE 1 TABLET BY MOUTH AT BEDTIME. 90 tablet 1   mometasone  (NASONEX ) 50 MCG/ACT nasal spray Place 2 sprays into the nose daily. 17 g 3   montelukast  (SINGULAIR ) 10 MG tablet Take 1 tablet (10 mg total) by mouth at bedtime. 90 tablet 3   Multiple Vitamins-Minerals (CENTRUM MINIS WOMEN 50+) TABS Take 1 tablet by mouth daily.     Omega-3 Fatty Acids (FISH OIL) 1000 MG CAPS Take 1,000 mg by mouth daily.     OVER THE COUNTER MEDICATION Place 2 sprays  into both nostrils at bedtime. Nasocort nasal spray     pantoprazole  (PROTONIX ) 40 MG tablet Take 1 tablet (40 mg total) by mouth daily. 90 tablet 3   Probiotic Product (PROBIOTIC PO) Take 1 tablet by mouth daily.     tirzepatide  (MOUNJARO ) 5 MG/0.5ML Pen Inject 5 mg into the skin once a week. 6 mL 2   traZODone  (DESYREL ) 50 MG tablet TAKE 2 TABLETS (100 MG TOTAL) BY MOUTH AT BEDTIME AS NEEDED. FOR SLEEP 180 tablet 1   triamcinolone  lotion (KENALOG ) 0.1 % Apply 1 application topically 2 (two) times daily as needed.     Turmeric (QC TUMERIC COMPLEX PO) Take 1 capsule by mouth daily.     No current facility-administered medications for this visit.     Physical Exam    VS:  BP 120/60 (BP Location: Left Arm, Patient Position: Sitting, Cuff Size: Normal)   Pulse 66   Ht 5' 4 (1.626 m)   Wt 194 lb 3.2 oz (88.1 kg)   SpO2 99%   BMI 33.33 kg/m  , BMI Body mass index is 33.33 kg/m.    STOP-Bang Score:  4        GEN: Well nourished, well developed, in no acute distress. HEENT: normal. Neck: Supple, no JVD, carotid bruits, or masses. Cardiac: RRR, no murmurs, rubs, or gallops. No clubbing, cyanosis, edema.  Radials 2+/PT 2+ and equal bilaterally.  Respiratory:  Respirations regular and unlabored, clear to auscultation bilaterally. GI: Soft, nontender, nondistended, BS + x 4. MS: no deformity or atrophy. Skin: warm and dry, no rash. Neuro:  Strength and sensation are intact. Psych: Normal affect.  Accessory Clinical Findings    ECG personally reviewed by me today - EKG Interpretation Date/Time:  Wednesday November 19 2023 11:02:50 EDT Ventricular Rate:  66 PR Interval:  206 QRS Duration:  78 QT Interval:  426 QTC Calculation: 446 R Axis:   -11  Text Interpretation: Normal sinus rhythm Cannot rule out Anterior infarct (cited on or before 19-Nov-2023) Confirmed by Vivienne Bruckner [phone]) on 11/19/2023 11:23:35  AM  - no acute changes.  Lab Results  Component Value Date   WBC 7.1  04/14/2023   HGB 13.4 04/14/2023   HCT 38.5 04/14/2023   MCV 91.4 04/14/2023   PLT 188 04/14/2023   Lab Results  Component Value Date   CREATININE 0.86 07/24/2023   BUN 15 07/24/2023   NA 132 (L) 07/24/2023   K 4.1 07/24/2023   CL 99 07/24/2023   CO2 27 07/24/2023   Lab Results  Component Value Date   ALT 27 07/24/2023   AST 36 07/24/2023   ALKPHOS 90 07/24/2023   BILITOT 0.6 07/24/2023   Lab Results  Component Value Date   CHOL 121 07/24/2023   HDL 39.70 07/24/2023   LDLCALC 50 07/24/2023   LDLDIRECT 55.0 07/24/2023   TRIG 155.0 (H) 07/24/2023   CHOLHDL 3 07/24/2023    Lab Results  Component Value Date   HGBA1C 5.5 07/24/2023   Lab Results  Component Value Date   TSH 1.57 10/08/2022       Assessment & Plan    1.  Coronary artery disease: Status post drug-eluting stent placement to the right coronary artery in late 2022.  She has been doing well without chest pain or dyspnea and remains on beta-blocker, statin, ARB, and calcium  channel blocker.  No aspirin  in the setting of Eliquis .  2.  Primary hypertension: Started on clonidine  earlier this year in the setting of ongoing elevated blood pressures.  Since, pressures have significantly improved and she is 120/60 today.  She has similar trends at home.  In addition to clonidine  0.1 mg twice daily, she is taking carvedilol , amlodipine , and losartan .  She is no longer taking furosemide .  3.  Fatigue/sleep disordered breathing: Patient notes that over the past 3 months, she has been experiencing daytime fatigue and little energy.  She attributes this to to initiating Mounjaro  therapy at that time, which is also resulted in some GI symptoms and indigestion.  She is considering going off of Mounjaro  to see if symptoms improve.  We also discussed her sleep habits.  She thinks that she sleeps well at night but does not awaken well rested.  Her STOP-BANG score is 4.  I did offer her referral to pulmonology for sleep study but at  this time she wishes to defer.  4.  Palpitations: Quiescent on beta-blocker therapy.  5.  Hyperlipidemia: LDL 55 in March with normal LFTs.  She remains on statin therapy.  6.  Type 2 diabetes mellitus: A1c of 5.5 in March.  She is now on Mounjaro  but not tolerating particularly well.  She plans to follow-up with primary care.  7.  Disposition: Follow-up in 6 months or sooner if necessary.  Lonni Meager, NP 11/19/2023, 12:34 PM

## 2023-11-19 NOTE — Patient Instructions (Signed)
 Medication Instructions:  Your physician recommends that you continue on your current medications as directed. Please refer to the Current Medication list given to you today.  *If you need a refill on your cardiac medications before your next appointment, please call your pharmacy*  Lab Work: No labs ordered today  If you have labs (blood work) drawn today and your tests are completely normal, you will receive your results only by: MyChart Message (if you have MyChart) OR A paper copy in the mail If you have any lab test that is abnormal or we need to change your treatment, we will call you to review the results.  Testing/Procedures: No test ordered today   Follow-Up: At [facility], you and your health needs are our priority.  As part of our continuing mission to provide you with exceptional heart care, our providers are all part of one team.  This team includes your primary Cardiologist (physician) and Advanced Practice Providers or APPs (Physician Assistants and Nurse Practitioners) who all work together to provide you with the care you need, when you need it.  Your next appointment:   6 month(s)  Provider:   You may see Redell Cave, MD or one of the following Advanced Practice Providers on your designated Care Team:   Lonni Meager, NP

## 2023-11-23 ENCOUNTER — Other Ambulatory Visit: Payer: Self-pay | Admitting: Internal Medicine

## 2024-01-11 ENCOUNTER — Other Ambulatory Visit: Payer: Self-pay | Admitting: Internal Medicine

## 2024-01-11 DIAGNOSIS — M109 Gout, unspecified: Secondary | ICD-10-CM

## 2024-01-26 ENCOUNTER — Ambulatory Visit (INDEPENDENT_AMBULATORY_CARE_PROVIDER_SITE_OTHER): Admitting: Internal Medicine

## 2024-01-26 ENCOUNTER — Encounter: Payer: Self-pay | Admitting: Internal Medicine

## 2024-01-26 VITALS — BP 128/78 | HR 76 | Ht 64.0 in | Wt 190.2 lb

## 2024-01-26 DIAGNOSIS — R0683 Snoring: Secondary | ICD-10-CM

## 2024-01-26 DIAGNOSIS — Z7985 Long-term (current) use of injectable non-insulin antidiabetic drugs: Secondary | ICD-10-CM

## 2024-01-26 DIAGNOSIS — E669 Obesity, unspecified: Secondary | ICD-10-CM

## 2024-01-26 DIAGNOSIS — E66813 Obesity, class 3: Secondary | ICD-10-CM

## 2024-01-26 DIAGNOSIS — I152 Hypertension secondary to endocrine disorders: Secondary | ICD-10-CM

## 2024-01-26 DIAGNOSIS — K224 Dyskinesia of esophagus: Secondary | ICD-10-CM

## 2024-01-26 DIAGNOSIS — E1159 Type 2 diabetes mellitus with other circulatory complications: Secondary | ICD-10-CM | POA: Diagnosis not present

## 2024-01-26 DIAGNOSIS — E785 Hyperlipidemia, unspecified: Secondary | ICD-10-CM

## 2024-01-26 DIAGNOSIS — E1169 Type 2 diabetes mellitus with other specified complication: Secondary | ICD-10-CM | POA: Diagnosis not present

## 2024-01-26 DIAGNOSIS — M109 Gout, unspecified: Secondary | ICD-10-CM | POA: Diagnosis not present

## 2024-01-26 DIAGNOSIS — R1311 Dysphagia, oral phase: Secondary | ICD-10-CM

## 2024-01-26 DIAGNOSIS — Z6841 Body Mass Index (BMI) 40.0 and over, adult: Secondary | ICD-10-CM | POA: Diagnosis not present

## 2024-01-26 DIAGNOSIS — Z23 Encounter for immunization: Secondary | ICD-10-CM | POA: Diagnosis not present

## 2024-01-26 DIAGNOSIS — D6869 Other thrombophilia: Secondary | ICD-10-CM

## 2024-01-26 DIAGNOSIS — I480 Paroxysmal atrial fibrillation: Secondary | ICD-10-CM

## 2024-01-26 LAB — COMPREHENSIVE METABOLIC PANEL WITH GFR
ALT: 14 U/L (ref 0–35)
AST: 22 U/L (ref 0–37)
Albumin: 3.8 g/dL (ref 3.5–5.2)
Alkaline Phosphatase: 77 U/L (ref 39–117)
BUN: 10 mg/dL (ref 6–23)
CO2: 27 meq/L (ref 19–32)
Calcium: 9.4 mg/dL (ref 8.4–10.5)
Chloride: 97 meq/L (ref 96–112)
Creatinine, Ser: 0.83 mg/dL (ref 0.40–1.20)
GFR: 68.49 mL/min (ref >60.00)
Glucose, Bld: 99 mg/dL (ref 70–99)
Potassium: 4 meq/L (ref 3.5–5.1)
Sodium: 131 meq/L — ABNORMAL LOW (ref 135–145)
Total Bilirubin: 0.8 mg/dL (ref 0.2–1.2)
Total Protein: 7.3 g/dL (ref 6.0–8.3)

## 2024-01-26 LAB — LIPID PANEL
Cholesterol: 110 mg/dL (ref 0–200)
HDL: 39.3 mg/dL (ref >39.00)
LDL Cholesterol: 46 mg/dL (ref 0–99)
NonHDL: 70.67
Total CHOL/HDL Ratio: 3
Triglycerides: 123 mg/dL (ref 0.0–149.0)
VLDL: 24.6 mg/dL (ref 0.0–40.0)

## 2024-01-26 LAB — HEMOGLOBIN A1C: Hgb A1c MFr Bld: 5.4 % (ref 4.6–6.5)

## 2024-01-26 LAB — LDL CHOLESTEROL, DIRECT: Direct LDL: 53 mg/dL

## 2024-01-26 LAB — URIC ACID: Uric Acid, Serum: 3.2 mg/dL (ref 2.4–7.0)

## 2024-01-26 MED ORDER — FLUTICASONE-SALMETEROL 250-50 MCG/ACT IN AEPB: INHALATION_SPRAY | RESPIRATORY_TRACT | 3 refills | Status: AC

## 2024-01-26 MED ORDER — ATORVASTATIN CALCIUM 40 MG PO TABS: 40.0000 mg | ORAL_TABLET | Freq: Every day | ORAL | 1 refills | Status: AC

## 2024-01-26 MED ORDER — MOUNJARO 7.5 MG/0.5ML ~~LOC~~ SOAJ: 7.5000 mg | SUBCUTANEOUS | 2 refills | Status: DC

## 2024-01-26 MED ORDER — CARVEDILOL 6.25 MG PO TABS: 6.2500 mg | ORAL_TABLET | Freq: Two times a day (BID) | ORAL | 1 refills | Status: AC

## 2024-01-26 MED ORDER — TRAZODONE HCL 50 MG PO TABS: 100.0000 mg | ORAL_TABLET | Freq: Every evening | ORAL | 1 refills | Status: AC | PRN

## 2024-01-26 MED ORDER — PANTOPRAZOLE SODIUM 40 MG PO TBEC: 40.0000 mg | DELAYED_RELEASE_TABLET | Freq: Two times a day (BID) | ORAL | 0 refills | Status: DC

## 2024-01-26 MED ORDER — BETAMETHASONE DIPROPIONATE 0.05 % EX CREA: TOPICAL_CREAM | Freq: Two times a day (BID) | CUTANEOUS | 0 refills | Status: DC

## 2024-01-26 MED ORDER — TRIAMCINOLONE ACETONIDE 0.1 % EX LOTN: 1.0000 | TOPICAL_LOTION | Freq: Two times a day (BID) | CUTANEOUS | 2 refills | Status: AC | PRN

## 2024-01-26 NOTE — Patient Instructions (Addendum)
 Add colace 100 mg every night   with your magnesium .  The  max dose is 200 mg  Save miralax for day  3 of no stool      Reflux:  Increase pantoprazole  to twice daily (2n doe before dinner) for two weeks  ,  Then  resume once daily in the morning and take famotodine 20 mg before dinner (available OTC)  In progress:  Referral to [facility] pulmonology for sleep study  Referral to speech pathology for swallow evaluation

## 2024-01-26 NOTE — Progress Notes (Unsigned)
 Subjective:  Patient ID: Kari [name], female    DOB: [birth year]  Age: 78 y.o. MRN: [id number]  CC: The primary encounter diagnosis was Obesity, diabetes, and hypertension syndrome (HCC). Diagnoses of Hyperlipidemia associated with type 2 diabetes mellitus (HCC), Snoring, Gouty arthritis, Oral phase dysphagia, Need for hepatitis B vaccination, Esophageal dysmotility, Hypercoagulable state due to paroxysmal atrial fibrillation (HCC), Paroxysmal atrial fibrillation with rapid ventricular response (HCC); CHA2DS2-VASc score 5 (HTN, DM-2, Age, Female, Aortic Plaque), and Class 3 severe obesity without serious comorbidity with body mass index (BMI) of 40.0 to 44.9 in adult, unspecified obesity type were also pertinent to this visit.   HPI Kari [name] presents for  Chief Complaint  Patient presents with   Medical Management of Chronic Issues   Kari [name] is a 78 yr old female with CAD, PAF, type 2 DM/oBesity and persistent dyspagia who presents for follow up on multiple issues   CAD:  found during workup for PAF, chest pain and dyspnea in December 2022.  She is s.p drug-eluting stent ONYX FRONTIER 2.75X30. in the  RCA in December 2022 for 99% stenosis ,  reduced to 0% stenosis post procedure.  Her last follow up with Cardiology  was  July.  She has not had chest pain or exertional dyspnea since her procedure.   2) PAF:  she continues to take  carvedilol   and eliquis .  Denies snoring but not resting well. Has small runs of AF lasting < 1 minute.  Has not had a screening sleep study    3) Type 2 DM/ obesity/HTN: taking mounajor 5 mg dose and has lost   20 lbs since February 2025, but weight has plateaued  for the past month  she is tolerating the persistent side effects of constipation  and reflux  symptoms at night .  Taking protonix  in the AM for  reflux,    4) Esophageal dysmotility: noted on Dec 2023 EGD done becauss of dysphagia for solids and liquids .  Currently feels  like pills are getting  stuck on the right side during attempts to swallow.    ) Emphysema  quit smoking 50 yrs ago    5) gout: recurrent flare involving  great toe on the Right. Occurring WEEKLY   JUST AT NIGHT . Already on max dose of  allopurinol .    6) osteoporosis   7) HTN:  taking carvedilol , clonidine  and losartan  . Home readings are all < 130/80  8) fatigue  waking up fatigue ,  sleep not restful .  Has findings suggestie of pulmonary  HTN on cardic CT.  No prior sleep study     Outpatient Medications Prior to Visit  Medication Sig Dispense Refill   Albuterol -Budesonide (AIRSUPRA ) 90-80 MCG/ACT AERO Inhale 1 puff into the lungs every 6 (six) hours as needed. 5.9 g 11   allopurinol  (ZYLOPRIM ) 300 MG tablet TAKE 1 TABLET BY MOUTH EVERY DAY 90 tablet 3   ALPRAZolam  (XANAX ) 0.25 MG tablet Take 0.5 tablets (0.125 mg total) by mouth 2 (two) times daily as needed for anxiety. 30 tablet 5   amLODipine  (NORVASC ) 5 MG tablet      azelastine  (ASTELIN ) 0.1 % nasal spray Place 2 sprays into both nostrils 2 (two) times daily. Use in each nostril as directed 30 mL 12   betamethasone  valerate ointment (VALISONE ) 0.1 % Apply 1 Application topically 2 (two) times daily. 45 g 0   calcium  carbonate (OS-CAL) 600 MG tablet Take 600 mg by mouth daily.  cetirizine (ZYRTEC) 10 MG tablet Take 10 mg by mouth daily.     Cinnamon 500 MG TABS Take 500 mg by mouth daily.     cloNIDine  (CATAPRES ) 0.1 MG tablet Take 1 tablet (0.1 mg total) by mouth 2 (two) times daily. 180 tablet 3   DENTA 5000 PLUS 1.1 % CREA dental cream Take by mouth.     ELIQUIS  5 MG TABS tablet TAKE 1 TABLET BY MOUTH TWICE A DAY 180 tablet 1   furosemide  (LASIX ) 20 MG tablet Take 1 tablet by mouth every other day 45 tablet 2   losartan  (COZAAR ) 100 MG tablet TAKE 1 TABLET BY MOUTH EVERYDAY AT BEDTIME 90 tablet 1   Lutein 20 MG TABS Take 20 mg by mouth daily.     Magnesium  250 MG TABS Take 250 mg by mouth daily.     mirtazapine  (REMERON ) 45 MG tablet TAKE  1 TABLET BY MOUTH AT BEDTIME. 90 tablet 1   mometasone  (NASONEX ) 50 MCG/ACT nasal spray Place 2 sprays into the nose daily. 17 g 3   montelukast  (SINGULAIR ) 10 MG tablet TAKE 1 TABLET BY MOUTH EVERYDAY AT BEDTIME 90 tablet 3   Multiple Vitamins-Minerals (CENTRUM MINIS WOMEN 50+) TABS Take 1 tablet by mouth daily.     Omega-3 Fatty Acids (FISH OIL) 1000 MG CAPS Take 1,000 mg by mouth daily.     OVER THE COUNTER MEDICATION Place 2 sprays into both nostrils at bedtime. Nasocort nasal spray     Probiotic Product (PROBIOTIC PO) Take 1 tablet by mouth daily.     Turmeric (QC TUMERIC COMPLEX PO) Take 1 capsule by mouth daily.     atorvastatin  (LIPITOR) 40 MG tablet Take 1 tablet (40 mg total) by mouth daily. 90 tablet 1   carvedilol  (COREG ) 6.25 MG tablet Take 1 tablet (6.25 mg total) by mouth 2 (two) times daily. 180 tablet 1   fluticasone -salmeterol (ADVAIR DISKUS) 250-50 MCG/ACT AEPB TAKE 1 PUFF BY MOUTH TWICE A DAY 180 each 3   pantoprazole  (PROTONIX ) 40 MG tablet Take 1 tablet (40 mg total) by mouth daily. 90 tablet 3   tirzepatide  (MOUNJARO ) 5 MG/0.5ML Pen Inject 5 mg into the skin once a week. 6 mL 2   traZODone  (DESYREL ) 50 MG tablet TAKE 2 TABLETS (100 MG TOTAL) BY MOUTH AT BEDTIME AS NEEDED. FOR SLEEP 180 tablet 1   triamcinolone  lotion (KENALOG ) 0.1 % Apply 1 application topically 2 (two) times daily as needed.     No facility-administered medications prior to visit.    Review of Systems;  Patient denies headache, fevers, malaise, unintentional weight loss, skin rash, eye pain, sinus congestion and sinus pain, sore throat, dysphagia,  hemoptysis , cough, dyspnea, wheezing, chest pain, palpitations, orthopnea, edema, abdominal pain, nausea, melena, diarrhea, constipation, flank pain, dysuria, hematuria, urinary  Frequency, nocturia, numbness, tingling, seizures,  Focal weakness, Loss of consciousness,  Tremor, insomnia, depression, anxiety, and suicidal ideation.      Objective:  BP  128/78   Pulse 76   Ht 5' 4 (1.626 m)   Wt 190 lb 3.2 oz (86.3 kg)   SpO2 98%   BMI 32.65 kg/m   BP Readings from Last 3 Encounters:  01/26/24 128/78  11/19/23 120/60  09/25/23 (!) 147/69    Wt Readings from Last 3 Encounters:  01/26/24 190 lb 3.2 oz (86.3 kg)  11/19/23 194 lb 3.2 oz (88.1 kg)  09/25/23 199 lb (90.3 kg)    Physical Exam  Lab Results  Component Value  Date   HGBA1C 5.4 01/26/2024   HGBA1C 5.5 07/24/2023   HGBA1C 5.4 10/08/2022    Lab Results  Component Value Date   CREATININE 0.83 01/26/2024   CREATININE 0.86 07/24/2023   CREATININE 0.84 04/14/2023    Lab Results  Component Value Date   WBC 7.1 04/14/2023   HGB 13.4 04/14/2023   HCT 38.5 04/14/2023   PLT 188 04/14/2023   GLUCOSE 99 01/26/2024   CHOL 110 01/26/2024   TRIG 123.0 01/26/2024   HDL 39.30 01/26/2024   LDLDIRECT 53.0 01/26/2024   LDLCALC 46 01/26/2024   ALT 14 01/26/2024   AST 22 01/26/2024   NA 131 (L) 01/26/2024   K 4.0 01/26/2024   CL 97 01/26/2024   CREATININE 0.83 01/26/2024   BUN 10 01/26/2024   CO2 27 01/26/2024   TSH 1.57 10/08/2022   INR 1.2 10/28/2022   HGBA1C 5.4 01/26/2024   MICROALBUR 1.5 07/24/2023    US  LT BREAST BX W LOC DEV 1ST LESION IMG BX SPEC US  GUIDE Addendum Date: 10/09/2023 ADDENDUM REPORT: 10/09/2023 15:36 ADDENDUM: PATHOLOGY revealed: 1. Breast, left, needle core biopsy, 1:00; 3 cmfn - INTRADUCTAL PAPILLOMA. Pathology results are CONCORDANT with imaging findings, per Corean Salter M.D. Pathology results and recommendations were discussed with patient via telephone on 10/08/2023 by Rock Hover RN. Patient reported biopsy site doing well with no adverse symptoms, and slight tenderness at the site. Post biopsy care instructions were reviewed, questions were answered and my direct phone number was provided. Patient was instructed to call [facility] for any additional questions or concerns related to biopsy site. RECOMMENDATION: Patient to  return in six months for unilateral LEFT breast diagnostic mammogram and ultrasound to ensure stability of biopsied lesion. Patient informed a reminder notice will be sent regarding this appointment and she may need to call mammography site to schedule this appointment. Pathology results reported by Rock Hover RN on 10/08/2023. Electronically Signed   By: Corean Salter M.D.   On: 10/09/2023 15:36   Result Date: 10/09/2023 CLINICAL DATA:  Indeterminate possible intraductal mass EXAM: ULTRASOUND GUIDED LEFT BREAST CORE NEEDLE BIOPSY COMPARISON:  Previous exam(s). PROCEDURE: I met with the patient and we discussed the procedure of ultrasound-guided biopsy, including benefits and alternatives. We discussed the high likelihood of a successful procedure. We discussed the risks of the procedure, including infection, bleeding, tissue injury, clip migration, and inadequate sampling. Informed written consent was given. The usual time-out protocol was performed immediately prior to the procedure. Lesion quadrant: Upper outer quadrant Using sterile technique and 1% lidocaine  and 1% lidocaine  with epinephrine  as local anesthetic, under direct ultrasound visualization, a 14 gauge spring-loaded device was used to perform biopsy of mass at 1 o'clock 3 cm from nipple using a lateral approach. At the conclusion of the procedure a RIBBON shaped tissue marker clip was deployed into the biopsy cavity. Follow up 2 view mammogram was performed and dictated separately. IMPRESSION: Ultrasound guided biopsy of a mass at 1 o'clock. No apparent complications. Electronically Signed: By: Corean Salter M.D. On: 10/07/2023 09:29   MM CLIP PLACEMENT LEFT Result Date: 10/07/2023 CLINICAL DATA:  Status post ultrasound-guided biopsy EXAM: 3D DIAGNOSTIC LEFT MAMMOGRAM POST ULTRASOUND BIOPSY COMPARISON:  Previous exam(s). ACR Breast Density Category b: There are scattered areas of fibroglandular density. FINDINGS: 3D Mammographic images  were obtained following ultrasound guided biopsy of a mass at 1 o'clock. The RIBBON biopsy marking clip is in expected position at the site of biopsy. IMPRESSION: Appropriate positioning of  the RIBBON shaped biopsy marking clip at the site of biopsy in the upper outer breast. Final Assessment: Post Procedure Mammograms for Marker Placement Electronically Signed   By: Corean Salter M.D.   On: 10/07/2023 09:28    Assessment & Plan:  .Obesity, diabetes, and hypertension syndrome (HCC) Assessment & Plan: Her diabetes was controlled with dietary  change, but she was unable to achieve weight loss until she started taking  Mounjaro  .she is tolerating medication.  Sie effects of constpation and reflux addressed.  Increase dose to 7.5 mg weekly as weight has been unchanged or one month.  Current wieght loss is 20 lbs. Since initiation of therapy in March 2025  Lab Results  Component Value Date   HGBA1C 5.4 01/26/2024   Lab Results  Component Value Date   MICROALBUR 1.5 07/24/2023      Orders: -     Comprehensive metabolic panel with GFR -     Hemoglobin A1c  Hyperlipidemia associated with type 2 diabetes mellitus (HCC) -     Lipid panel -     LDL cholesterol, direct  Snoring Assessment & Plan: She has agreed to sleep study given history of snoring obesity, hypertension and PAF.  Referral to pulmonolgy for sleep study in progress   Orders: -     Pulmonary Visit  Gouty arthritis -     Uric acid  Oral phase dysphagia -     DG SWALLOW FUNC OP MEDICARE SPEECH PATH; Future  Need for hepatitis B vaccination -     Heplisav-B  (HepB-CPG) Vaccine  Esophageal dysmotility Assessment & Plan: Reviewed 2023 DG   currently her symptoms suggest a more proximal issue involving the failure of pills to pass from pharynx to esophagus.  Modified barium swallow ordered    Hypercoagulable state due to paroxysmal atrial fibrillation [facility]) Assessment & Plan: Continue lifelong Eliquis  for  CHA2DSVASC2 score of 5    Paroxysmal atrial fibrillation with rapid ventricular response (HCC); CHA2DS2-VASc score 5 (HTN, DM-2, Age, Female, Aortic Plaque) Assessment & Plan: She has intermittend brief episodes on current dose of carvedilol .  Sleep study recommended ; she has agreed to pulmonology referral    Class 3 severe obesity without serious comorbidity with body mass index (BMI) of 40.0 to 44.9 in adult, unspecified obesity type Assessment & Plan: She has lost 20 lbs with use of Mounjaro .  Side effects of constipation and reflux addressed.  Increase dose to 7.5 mg  based on plateau of weight    Other orders -     Atorvastatin  Calcium ; Take 1 tablet (40 mg total) by mouth daily.  Dispense: 90 tablet; Refill: 1 -     Carvedilol ; Take 1 tablet (6.25 mg total) by mouth 2 (two) times daily.  Dispense: 180 tablet; Refill: 1 -     Pantoprazole  Sodium; Take 1 tablet (40 mg total) by mouth 2 (two) times daily. For two weeks then resume once daily  Dispense: 104 tablet; Refill: 0 -     traZODone  HCl; Take 2 tablets (100 mg total) by mouth at bedtime as needed. for sleep  Dispense: 180 tablet; Refill: 1 -     Fluticasone -Salmeterol; TAKE 1 PUFF BY MOUTH TWICE A DAY  Dispense: 180 each; Refill: 3 -     Triamcinolone  Acetonide; Apply 1 Application topically 2 (two) times daily as needed.  Dispense: 60 mL; Refill: 2 -     Mounjaro ; Inject 7.5 mg into the skin once a week.  Dispense: 3 mL; Refill: 2 -  Betamethasone  Dipropionate; Apply topically 2 (two) times daily.  Dispense: 45 g; Refill: 0    I personally spent a total of 30 minutes in the care of the patient today including preparing to see the patient, getting/reviewing separately obtained history, performing a medically appropriate exam/evaluation, counseling and educating, placing orders, documenting clinical information in the EHR, and independently interpreting results. 4 Follow-up: No follow-ups on file.   Verneita LITTIE Kettering, MD

## 2024-01-27 DIAGNOSIS — R0683 Snoring: Secondary | ICD-10-CM | POA: Insufficient documentation

## 2024-01-27 NOTE — Assessment & Plan Note (Signed)
Continue lifelong Eliquis for CHA2DSVASC2 score of 5  ?

## 2024-01-27 NOTE — Assessment & Plan Note (Signed)
 She has intermittend brief episodes on current dose of carvedilol .  Sleep study recommended ; she has agreed to pulmonology referral

## 2024-01-27 NOTE — Assessment & Plan Note (Signed)
 Reviewed 2023 DG   currently her symptoms suggest a more proximal issue involving the failure of pills to pass from pharynx to esophagus.  Modified barium swallow ordered

## 2024-01-27 NOTE — Assessment & Plan Note (Addendum)
 Her diabetes was controlled with dietary  change, but she was unable to achieve weight loss until she started taking  Mounjaro  .she is tolerating medication.  Sie effects of constpation and reflux addressed.  Increase dose to 7.5 mg weekly as weight has been unchanged or one month.  Current wieght loss is 20 lbs. Since initiation of therapy in March 2025  Lab Results  Component Value Date   HGBA1C 5.4 01/26/2024   Lab Results  Component Value Date   MICROALBUR 1.5 07/24/2023

## 2024-01-27 NOTE — Assessment & Plan Note (Signed)
 She has agreed to sleep study given history of snoring obesity, hypertension and PAF.  Referral to pulmonolgy for sleep study in progress

## 2024-01-27 NOTE — Assessment & Plan Note (Signed)
 She has lost 20 lbs with use of Mounjaro .  Side effects of constipation and reflux addressed.  Increase dose to 7.5 mg  based on plateau of weight

## 2024-01-28 ENCOUNTER — Ambulatory Visit: Payer: Self-pay | Admitting: Internal Medicine

## 2024-01-28 DIAGNOSIS — R1319 Other dysphagia: Secondary | ICD-10-CM

## 2024-02-11 ENCOUNTER — Encounter: Payer: Self-pay | Admitting: Internal Medicine

## 2024-02-12 NOTE — Telephone Encounter (Signed)
 Is this the study that the pt's have to call and schedule themselves?

## 2024-02-18 ENCOUNTER — Encounter: Payer: Self-pay | Admitting: Sleep Medicine

## 2024-02-18 ENCOUNTER — Ambulatory Visit (INDEPENDENT_AMBULATORY_CARE_PROVIDER_SITE_OTHER): Admitting: Sleep Medicine

## 2024-02-18 ENCOUNTER — Other Ambulatory Visit: Payer: Self-pay | Admitting: Cardiology

## 2024-02-18 VITALS — BP 122/70 | HR 69 | Temp 97.1°F | Ht 64.0 in | Wt 190.4 lb

## 2024-02-18 DIAGNOSIS — I480 Paroxysmal atrial fibrillation: Secondary | ICD-10-CM | POA: Diagnosis not present

## 2024-02-18 DIAGNOSIS — G4733 Obstructive sleep apnea (adult) (pediatric): Secondary | ICD-10-CM | POA: Diagnosis not present

## 2024-02-18 DIAGNOSIS — Z6832 Body mass index (BMI) 32.0-32.9, adult: Secondary | ICD-10-CM

## 2024-02-18 DIAGNOSIS — E669 Obesity, unspecified: Secondary | ICD-10-CM

## 2024-02-18 NOTE — Patient Instructions (Signed)
 SABRA

## 2024-02-18 NOTE — Telephone Encounter (Signed)
 Prescription refill request for Eliquis  received. Indication:afib Last office visit:7/25 Scr:0.83  9/25 Age: 78 Weight:86.3  kg  Prescription refilled

## 2024-02-18 NOTE — Progress Notes (Signed)
 Name:Kari [name] MRN: [id number] DOB: August 12, 1945   CHIEF COMPLAINT:  EXCESSIVE DAYTIME SLEEPINESS   HISTORY OF PRESENT ILLNESS: Kari [name] is a 78 y.o. w/ a h/o OSA, atrial fibrillation, CAD and obesity who presents for excessive daytime sleepiness which has been present for several years ago. Reports nocturnal awakenings due to nocturia, however does not have difficulty falling back to sleep. Admits to dry mouth. Denies morning headaches, RLS symptoms, dream enactment, cataplexy, hypnagogic or hypnapompic hallucinations. Reports a family history of sleep apnea. Denies drowsy driving. Drinks 28 oz of tea daily, occasional alcohol use, denies tobacco or illicit drug use.   Bedtime 12-1 am Sleep onset 10-30 mins Rise time 9 am   EPWORTH SLEEP SCORE 8    02/18/2024    1:36 PM  Results of the Epworth flowsheet  Sitting and reading 1  Watching TV 2  Sitting, inactive in a public place (e.g. a theatre or a meeting) 0  As a passenger in a car for an hour without a break 0  Lying down to rest in the afternoon when circumstances permit 3  Sitting and talking to someone 0  Sitting quietly after a lunch without alcohol 2  In a car, while stopped for a few minutes in traffic 0  Total score 8    PAST MEDICAL HISTORY :   has a past medical history of Allergy (1970), Anxiety (2010), Arthritis (1995), Cataract (2020), COPD (chronic obstructive pulmonary disease) with chronic bronchitis (HCC), Coronary artery disease involving native coronary artery of native heart with other form of angina pectoris, Depression (1993), Diastolic dysfunction, Esophageal dysmotility (04/30/2022), GERD (gastroesophageal reflux disease) (1990), adenomatous colonic polyps (05/30/2010), Hyperlipidemia, Hypertension, Lymphedema of both lower extremities, Menopause, Migraines, Miscarriage, Moderate persistent asthma, Obesity (BMI 30.0-34.9) (03/04/2013), PAF (paroxysmal atrial fibrillation) (HCC), and PONV  (postoperative nausea and vomiting).  has a past surgical history that includes Tubal ligation (1980); Cesarean section (1976); Dental surgery (09/2016); Colonoscopy; Polypectomy; Cataract extraction, bilateral; transthoracic echocardiogram (11/04/2019); LEFT HEART CATH AND CORONARY ANGIOGRAPHY (Left, 04/30/2021); CORONARY STENT INTERVENTION (N/A, 04/30/2021); Eye surgery (03/2019); and Breast biopsy (Left, 10/07/2023). Prior to Admission medications   Medication Sig Start Date End Date Taking? Authorizing Provider  Albuterol -Budesonide (AIRSUPRA ) 90-80 MCG/ACT AERO Inhale 1 puff into the lungs every 6 (six) hours as needed. 08/07/23  Yes Marylynn Verneita CROME, MD  allopurinol  (ZYLOPRIM ) 300 MG tablet TAKE 1 TABLET BY MOUTH EVERY DAY 01/13/24  Yes Tullo, Teresa L, MD  ALPRAZolam  (XANAX ) 0.25 MG tablet Take 0.5 tablets (0.125 mg total) by mouth 2 (two) times daily as needed for anxiety. 06/18/23  Yes Marylynn Verneita CROME, MD  amLODipine  (NORVASC ) 5 MG tablet  06/30/23  Yes [provider]  atorvastatin  (LIPITOR) 40 MG tablet Take 1 tablet (40 mg total) by mouth daily. 01/26/24  Yes Marylynn Verneita CROME, MD  azelastine  (ASTELIN ) 0.1 % nasal spray Place 2 sprays into both nostrils 2 (two) times daily. Use in each nostril as directed 06/18/23  Yes Marylynn Verneita CROME, MD  betamethasone  dipropionate 0.05 % cream Apply topically 2 (two) times daily. 01/26/24  Yes Marylynn Verneita CROME, MD  betamethasone  valerate ointment (VALISONE ) 0.1 % Apply 1 Application topically 2 (two) times daily. 10/14/22  Yes Marylynn Verneita CROME, MD  calcium  carbonate (OS-CAL) 600 MG tablet Take 600 mg by mouth daily.   Yes [provider]  carvedilol  (COREG ) 6.25 MG tablet Take 1 tablet (6.25 mg total) by mouth 2 (two) times daily. 01/26/24  Yes  Tullo, Teresa L, MD  cetirizine (ZYRTEC) 10 MG tablet Take 10 mg by mouth daily.   Yes [provider]  Cinnamon 500 MG TABS Take 500 mg by mouth daily.   Yes [provider]  cloNIDine   (CATAPRES ) 0.1 MG tablet Take 1 tablet (0.1 mg total) by mouth 2 (two) times daily. 08/19/23  Yes Agbor-Etang, Redell, MD  DENTA 5000 PLUS 1.1 % CREA dental cream Take by mouth. 05/28/23  Yes [provider]  ELIQUIS  5 MG TABS tablet TAKE 1 TABLET BY MOUTH TWICE A DAY 02/18/24  Yes Anner Alm ORN, MD  fluticasone -salmeterol (ADVAIR DISKUS) 250-50 MCG/ACT AEPB TAKE 1 PUFF BY MOUTH TWICE A DAY 01/26/24  Yes Tullo, Teresa L, MD  furosemide  (LASIX ) 20 MG tablet Take 1 tablet by mouth every other day 12/18/21  Yes Webb, Padonda B, FNP  losartan  (COZAAR ) 100 MG tablet TAKE 1 TABLET BY MOUTH EVERYDAY AT BEDTIME 11/25/23  Yes Tullo, Teresa L, MD  Lutein 20 MG TABS Take 20 mg by mouth daily.   Yes [provider]  Magnesium  250 MG TABS Take 250 mg by mouth daily.   Yes [provider]  mirtazapine  (REMERON ) 45 MG tablet TAKE 1 TABLET BY MOUTH AT BEDTIME. 11/25/23  Yes Marylynn Verneita CROME, MD  mometasone  (NASONEX ) 50 MCG/ACT nasal spray Place 2 sprays into the nose daily. 09/11/22  Yes [provider]  montelukast  (SINGULAIR ) 10 MG tablet TAKE 1 TABLET BY MOUTH EVERYDAY AT BEDTIME 01/13/24  Yes Marylynn Verneita CROME, MD  Multiple Vitamins-Minerals (CENTRUM MINIS WOMEN 50+) TABS Take 1 tablet by mouth daily.   Yes [provider]  Omega-3 Fatty Acids (FISH OIL) 1000 MG CAPS Take 1,000 mg by mouth daily.   Yes [provider]  OVER THE COUNTER MEDICATION Place 2 sprays into both nostrils at bedtime. Nasocort nasal spray   Yes [provider]  pantoprazole  (PROTONIX ) 40 MG tablet Take 1 tablet (40 mg total) by mouth 2 (two) times daily. For two weeks then resume once daily 01/26/24  Yes Tullo, Teresa L, MD  Probiotic Product (PROBIOTIC PO) Take 1 tablet by mouth daily.   Yes [provider]  tirzepatide  (MOUNJARO ) 7.5 MG/0.5ML Pen Inject 7.5 mg into the skin once a week. 01/26/24  Yes Marylynn Verneita CROME, MD  traZODone  (DESYREL ) 50 MG tablet Take 2 tablets (100 mg  total) by mouth at bedtime as needed. for sleep 01/26/24  Yes Marylynn Verneita CROME, MD  triamcinolone  lotion (KENALOG ) 0.1 % Apply 1 Application topically 2 (two) times daily as needed. 01/26/24  Yes Marylynn Verneita CROME, MD  Turmeric (QC TUMERIC COMPLEX PO) Take 1 capsule by mouth daily.   Yes [provider]   Allergies  Allergen Reactions   Colchicine  Other (See Comments)    C/I with carvedilol    Hydrochlorothiazide  Other (See Comments)    Severe hyponatremia    Nifedipine     REACTION: swollen feet    FAMILY HISTORY:  family history includes Arthritis in Kari [name] father; COPD in Kari [name] brother; Cancer in Kari [name] brother and mother; Cancer (age of onset: 70) in Kari [name] sister; Cervical cancer in Kari [name] mother; Colon cancer (age of onset: 68) in Kari [name] sister; Coronary artery disease in Kari [name] father; Depression in Kari [name] mother; Hearing loss in Kari [name] father; Heart Problems in Kari [name] brother and sister; Heart attack in Kari [name] paternal grandfather; Heart attack (age of onset: 30) in Kari [name] father; Heart disease in Kari [name] brother and father; Hypertension in Kari [name] father and mother;  Myasthenia gravis in Kari [name] mother; Obesity in Kari [name] mother; Stroke in Kari [name] mother; Valvular heart disease in Kari [name] father. SOCIAL HISTORY:  reports that she quit smoking about 53 years ago. Kari [name] smoking use included cigarettes. She has never used smokeless tobacco. She reports that she does not currently use alcohol. She reports that she does not use drugs.   Review of Systems:  Gen:  Denies  fever, sweats, chills weight loss  HEENT: Denies blurred vision, double vision, ear pain, eye pain, hearing loss, nose bleeds, sore throat Cardiac:  No dizziness, chest pain or heaviness, chest tightness,edema, No JVD Resp:   No cough, -sputum production, -shortness of breath,-wheezing, -hemoptysis,  Gi: Denies swallowing difficulty, stomach pain, nausea or vomiting, diarrhea, constipation, bowel incontinence Gu:  Denies bladder incontinence, burning urine Ext:   Denies Joint  pain, stiffness or swelling Skin: Denies  skin rash, easy bruising or bleeding or hives Endoc:  Denies polyuria, polydipsia , polyphagia or weight change Psych:   Denies depression, insomnia or hallucinations  Other:  All other systems negative  VITAL SIGNS: BP 122/70   Pulse 69   Temp (!) 97.1 F (36.2 C)   Ht 5' 4 (1.626 m)   Wt 190 lb 6.4 oz (86.4 kg)   SpO2 97%   BMI 32.68 kg/m    Physical Examination:   General Appearance: No distress  EYES PERRLA, EOM intact.   NECK Supple, No JVD Pulmonary: normal breath sounds, No wheezing.  CardiovascularNormal S1,S2.  No m/r/g.   Abdomen: Benign, Soft, non-tender. Skin:   warm, no rashes, no ecchymosis  Extremities: normal, no cyanosis, clubbing. Neuro:without focal findings,  speech normal  PSYCHIATRIC: Mood, affect within normal limits.   ASSESSMENT AND PLAN  OSA I suspect that OSA is likely present due to clinical presentation. Discussed the consequences of untreated sleep apnea. Advised not to drive drowsy for safety of patient and others. Will complete further evaluation with a home sleep study and follow up to review results.    Atrial fibrillation Stable, on current management. Following with cardiology.   Obesity Counseled patient on diet and lifestyle modification.    MEDICATION ADJUSTMENTS/LABS AND TESTS ORDERED: Recommend Sleep Study   Patient  satisfied with Plan of action and management. All questions answered  Follow up to review HST results and treatment plan.   I spent a total of 33 minutes reviewing chart data, face-to-face evaluation with the patient, counseling and coordination of care as detailed above.    Jacynda Brunke, M.D.  Sleep Medicine [facility]

## 2024-02-24 ENCOUNTER — Ambulatory Visit
Admission: RE | Admit: 2024-02-24 | Discharge: 2024-02-24 | Disposition: A | Discharge status: Home / Self Care | Source: Ambulatory Visit | Attending: Internal Medicine | Admitting: Internal Medicine

## 2024-02-24 DIAGNOSIS — R1311 Dysphagia, oral phase: Secondary | ICD-10-CM | POA: Diagnosis not present

## 2024-02-24 DIAGNOSIS — R6889 Other general symptoms and signs: Secondary | ICD-10-CM | POA: Diagnosis not present

## 2024-02-24 DIAGNOSIS — R1310 Dysphagia, unspecified: Secondary | ICD-10-CM | POA: Diagnosis not present

## 2024-02-24 NOTE — Progress Notes (Signed)
 Modified Barium Swallow Study  Patient Details  Name: Kari [name] MRN: [id number] Date of Birth: 05-May-1946  Today's Date: 02/24/2024  Modified Barium Swallow completed.  Full report located under Chart Review in the Imaging Section.  History of Present Illness Pt is a 78 yr old female with CAD, PAF, type 2 DM, Obesity, gout, REFLUX on a PPI, and persistent c/o Esophageal phase dyspagia. She takes her Lower Denture plate out to eat d/t ill-fit.  Pt was on Nexium for a PPI in years past but was switched to pantaprozole(suspected) d/t her cardiac issues per her MD. Pt endorsed years of terrible REFLUX, burning REFLUX which came up into my throat; I could feel it burning- pointing to her chest.  There is report per MD note of a Dec 2023 EGD done but unaware of results. Pt c/o Pills getting stuck when she swallows them(Vitamins moreso). Pt has No h/o pneumonia per pt report/chart; no recent CXRs. Pt has no h/o neurological disease per chart.   Clinical Impression Patient appears to present w/ grossly functional-mild oropharyngeal swallowing for age, and in setting of Baseline h/o significant Esophageal phase Dysmotility and ACID REFLUX activity. ANY Esophageal phase deficits can impact the oropharyngeal phase of swallowing; ACID RELFUX to the level of the pharynx can have an impact on the sensory/timing of the pharyngeal swallow as well as impact voicing potentially.   Oral phase is characterized by adequate lip closure, bolus preparation and containment, and anterior to posterior transit. Swallow initiation occurs primarily at the level of the valleculae for food and increased textured liquid boluses w/ min delayed pharyngeal swallow initiation(spillage from the valleculae) occurring w/ thin liquids and during consecutive sips of liquids/trials.  Pharyngeal phase is noted for slightly reduced tongue base retraction (intermittently) w/ likely impact from Xerostomia and ACID REFLUX history,  resulting in min BOT and Valleculae residue; overall adequate hyolaryngeal excursion and adequate pharyngeal constriction. Pharyngeal stripping wave is adequate. Epiglottic inversion is complete w/ the majority of trials during the study, however, noted decreased timely/tight epiglottic inversion/closure during the challenge of Multiple sips resulting in laryngeal penetration/coating along underneath side of epiglottis w/ Thin liquids and an occurrence of aspiration w/ Nectar liquids during multiple sips 1x(Silent initially). The penetrated/aspirated material appeared to clear w/ completion of the swallows and subsequent dry swallows -- a cued throat clear was also beneficial in clearing the aspirated material. Any slight-min BOT and valleculae residue occurring w/ bolus consistencies cleared w/ independent, dry swallowing b/t trials.   Amplitude/duration of cricopharyngeus opening appeared [facility]. There was adequate/complete clearance through the upper Cervical Esophagus. An appearance of prominent Cervical Vertebrae was noted just below the UES. This did not impede/prevent bolus clearance, but it appeared to narrow the Esophageal passageway at that level.  A 13 mm barium tablet cleared the oropharynx and viewable Cervical Esophagus when given in Puree.  Pt does have Significant h/o Esophageal Dysmotility and ACID REFLUX activity over years per pt report. Pt was educated on the results of the study immediately after; viewed the study together and questions answered. Recommendations given. Pt agreed. Factors that may increase risk of adverse event in presence of aspiration Noe & Lianne 2021): GI disease; (Xerostomia)  Swallow Evaluation Recommendations Recommendations: PO diet PO Diet Recommendation: Regular; Thin liquids (Level 0)- Cup drinking for small sips(controlled better) Liquid Administration via: Cup Medication Administration: Whole meds with puree Supervision: Patient able to  self-feed Swallowing strategies: Slow rate; Small bites/sips; Minimize environmental distractions; Multiple dry swallows after each bite/sip; Follow  solids with liquids Postural changes: Position pt fully upright for meals; Stay upright 30-60 min after meals; Out of bed for meals (REFLUX precautions) Oral care recommendations: Oral care BID (2x/day); Pt independent with oral care (denture care) Recommended consults: Consider GI consultation; Consider esophageal assessment (Denture care/repair of Lower Denture plate to be able to wear for chewing/masticating foods appropriately)        Comer Portugal, MS, CCC-SLP Speech Language Pathologist Rehab Services; [facility] [phone] (ascom) Quentin Shorey 02/24/2024,6:44 PM

## 2024-03-01 DIAGNOSIS — H02831 Dermatochalasis of right upper eyelid: Secondary | ICD-10-CM | POA: Diagnosis not present

## 2024-03-01 DIAGNOSIS — H02834 Dermatochalasis of left upper eyelid: Secondary | ICD-10-CM | POA: Diagnosis not present

## 2024-03-01 DIAGNOSIS — H35372 Puckering of macula, left eye: Secondary | ICD-10-CM | POA: Diagnosis not present

## 2024-03-01 DIAGNOSIS — H35412 Lattice degeneration of retina, left eye: Secondary | ICD-10-CM | POA: Diagnosis not present

## 2024-03-01 DIAGNOSIS — H52223 Regular astigmatism, bilateral: Secondary | ICD-10-CM | POA: Diagnosis not present

## 2024-03-01 DIAGNOSIS — H43813 Vitreous degeneration, bilateral: Secondary | ICD-10-CM | POA: Diagnosis not present

## 2024-03-01 DIAGNOSIS — H524 Presbyopia: Secondary | ICD-10-CM | POA: Diagnosis not present

## 2024-03-01 DIAGNOSIS — H04123 Dry eye syndrome of bilateral lacrimal glands: Secondary | ICD-10-CM | POA: Diagnosis not present

## 2024-03-01 DIAGNOSIS — H5203 Hypermetropia, bilateral: Secondary | ICD-10-CM | POA: Diagnosis not present

## 2024-03-11 ENCOUNTER — Encounter

## 2024-03-11 DIAGNOSIS — G4733 Obstructive sleep apnea (adult) (pediatric): Secondary | ICD-10-CM

## 2024-03-19 DIAGNOSIS — R0683 Snoring: Secondary | ICD-10-CM | POA: Diagnosis not present

## 2024-03-23 ENCOUNTER — Ambulatory Visit: Payer: Self-pay

## 2024-03-23 NOTE — Telephone Encounter (Signed)
-----   Message from [facility] D REDDY sent at 03/23/2024  1:13 PM EST ----- HST is negative for sleep apnea, recommend in lab split night study for further evaluation. Please complete order if patient would like to proceed with study.  ----- Message ----- From: Vannie Donzell RAMAN Sent: 03/19/2024  11:08 AM EST To: Pallavi D Reddy, MD

## 2024-03-24 ENCOUNTER — Other Ambulatory Visit (INDEPENDENT_AMBULATORY_CARE_PROVIDER_SITE_OTHER): Payer: Self-pay | Admitting: Nurse Practitioner

## 2024-03-24 DIAGNOSIS — I701 Atherosclerosis of renal artery: Secondary | ICD-10-CM

## 2024-03-24 NOTE — Telephone Encounter (Signed)
-----   Message from [facility] D REDDY sent at 03/23/2024  1:13 PM EST ----- HST is negative for sleep apnea, recommend in lab split night study for further evaluation. Please complete order if patient would like to proceed with study.  ----- Message ----- From: Vannie Donzell RAMAN Sent: 03/19/2024  11:08 AM EST To: Pallavi D Reddy, MD

## 2024-03-24 NOTE — Telephone Encounter (Signed)
 LMTCB. E2C2 please advise when patient calls back.

## 2024-03-26 ENCOUNTER — Ambulatory Visit (INDEPENDENT_AMBULATORY_CARE_PROVIDER_SITE_OTHER): Admitting: Nurse Practitioner

## 2024-03-26 ENCOUNTER — Encounter (INDEPENDENT_AMBULATORY_CARE_PROVIDER_SITE_OTHER): Payer: Self-pay | Admitting: Nurse Practitioner

## 2024-03-26 ENCOUNTER — Other Ambulatory Visit (INDEPENDENT_AMBULATORY_CARE_PROVIDER_SITE_OTHER)

## 2024-03-26 VITALS — BP 134/70 | HR 63 | Ht 64.0 in | Wt 190.0 lb

## 2024-03-26 DIAGNOSIS — I701 Atherosclerosis of renal artery: Secondary | ICD-10-CM

## 2024-03-26 DIAGNOSIS — I10 Essential (primary) hypertension: Secondary | ICD-10-CM | POA: Diagnosis not present

## 2024-03-26 DIAGNOSIS — E785 Hyperlipidemia, unspecified: Secondary | ICD-10-CM

## 2024-03-26 DIAGNOSIS — E1169 Type 2 diabetes mellitus with other specified complication: Secondary | ICD-10-CM | POA: Diagnosis not present

## 2024-03-26 NOTE — Progress Notes (Signed)
 Subjective:    Patient ID: Kari [name], female    DOB: 02/23/1946, 78 y.o.   MRN: [id number] Chief Complaint  Patient presents with   Follow-up    fu 6 months + Renal    HPI Discussed the use of AI scribe software for clinical note transcription with the patient, who gave verbal consent to proceed.  History of Present Illness Kari [name] is a 78 year old female with hypertension who presents for a vascular surgery follow-up.  Her blood pressure has improved since starting carvedilol , which she takes twice a day. No dizziness or lightheadedness since starting the medication.  Previous studies indicated 1-59% stenosis on the right side, according to the patient's recollection. She has not experienced any pain while walking.  She has a history of leg swelling, which was previously investigated with scans. Improvement is attributed to significant weight loss and using a recliner to elevate her feet.    Results RADIOLOGY Renal duplex ultrasound: No significant stenosis bilaterally (03/26/2024)    Review of Systems  Cardiovascular:  Negative for leg swelling.  All other systems reviewed and are negative.      Objective:   Physical Exam Vitals reviewed.  HENT:     Head: Normocephalic.  Cardiovascular:     Rate and Rhythm: Normal rate.  Pulmonary:     Effort: Pulmonary effort is normal.  Musculoskeletal:     Right lower leg: No edema.     Left lower leg: No edema.  Skin:    General: Skin is warm and dry.  Neurological:     Mental Status: She is alert and oriented to person, place, and time.  Psychiatric:        Mood and Affect: Mood normal.        Thought Content: Thought content normal.        Judgment: Judgment normal.     BP 134/70   Pulse 63   Ht 5' 4 (1.626 m)   Wt 190 lb (86.2 kg)   BMI 32.61 kg/m   Past Medical History:  Diagnosis Date   Allergy 1970   to cat, dogs, pollen, dust, cigarette smoke   Anxiety 2010   Arthritis 1995   feet    Cataract 2020   removed both eyes   COPD (chronic obstructive pulmonary disease) with chronic bronchitis (HCC)    And moderate persistent asthma; complicated by Restrictive Lung Disease from obesity   Coronary artery disease involving native coronary artery of native heart with other form of angina pectoris    a. 04/2021 Cor CTA: Cor Ca2+ 389. Domninant RCA-RPDA<PLA. RCA >70p (abnl CTFFR), LAD 50-69p, LCX Min dz; b. 04/2021 Cath/PCI: LM nl, LAD mild diff dzs, D1/2/3 nl, LCX min irregs, OM1/2/3 nl, RCA 99p (2.75x30 Onyx Frontier DES).   Depression 1993   Diastolic dysfunction    a. 04/2021 Echo: EF 60-65%, GrI DD. Nl RV fxn. Mildly dil LA.   Esophageal dysmotility 04/30/2022   Barium swallow 04/30/2022-also has impingement of tablet at aortic arch, we know from CT angiography that she has mild aortic enlargement and pulmonary outflow tract enlargement   GERD (gastroesophageal reflux disease) 1990   Hx of adenomatous colonic polyps 05/30/2010   Hyperlipidemia    Hypertension    Lymphedema of both lower extremities    No superficial venous reflux noted.  However right-sided saphenofemoral junction reflux noted and left common femoral vein reflux noted.  No DVT or superficial thrombus.   Menopause    Migraines  Miscarriage    X 1   Moderate persistent asthma    Obesity (BMI 30.0-34.9) 03/04/2013   PAF (paroxysmal atrial fibrillation) (HCC)    a. CHA2DS2VASc = 5-->eliquis .   PONV (postoperative nausea and vomiting)     Social History   Socioeconomic History   Marital status: Married    Spouse name: Not on file   Number of children: Not on file   Years of education: Not on file   Highest education level: 12th grade  Occupational History   Not on file  Tobacco Use   Smoking status: Former    Current packs/day: 0.00    Types: Cigarettes    Quit date: 01/02/1971    Years since quitting: 53.2   Smokeless tobacco: Never  Vaping Use   Vaping status: Never Used  Substance and  Sexual Activity   Alcohol use: Not Currently    Comment: occasional- wine gives pt headache only drinks once every 6 months   Drug use: No   Sexual activity: Not Currently  Other Topics Concern   Not on file  Social History Narrative   Married, works full-time for a international aid/development worker   Former smoker no alcohol tobacco or drug use at this time   Social Drivers of Corporate Investment Banker Strain: Low Risk  (01/25/2024)   Overall Financial Resource Strain (CARDIA)    Difficulty of Paying Living Expenses: Not very hard  Food Insecurity: No Food Insecurity (01/25/2024)   Hunger Vital Sign    Worried About Running Out of Food in the Last Year: Never true    Ran Out of Food in the Last Year: Never true  Transportation Needs: No Transportation Needs (01/25/2024)   PRAPARE - Administrator, Civil Service (Medical): No    Lack of Transportation (Non-Medical): No  Physical Activity: Inactive (01/25/2024)   Exercise Vital Sign    Days of Exercise per Week: 0 days    Minutes of Exercise per Session: Not on file  Stress: No Stress Concern Present (01/25/2024)   [employer] of Occupational Health - Occupational Stress Questionnaire    Feeling of Stress: Only a little  Social Connections: Moderately Isolated (01/25/2024)   Social Connection and Isolation Panel    Frequency of Communication with Friends and Family: More than three times a week    Frequency of Social Gatherings with Friends and Family: Once a week    Attends Religious Services: Never    Database Administrator or Organizations: No    Attends Engineer, Structural: Not on file    Marital Status: Married  Catering Manager Violence: Not At Risk (09/24/2023)   Humiliation, Afraid, Rape, and Kick questionnaire    Fear of Current or Ex-Partner: No    Emotionally Abused: No    Physically Abused: No    Sexually Abused: No    Past Surgical History:  Procedure Laterality Date   BREAST BIOPSY Left 10/07/2023   US   LT BREAST BX W LOC DEV 1ST LESION IMG BX SPEC US  GUIDE 10/07/2023 [facility]   CATARACT EXTRACTION, BILATERAL     CESAREAN SECTION  1976   X 2   COLONOSCOPY     CORONARY STENT INTERVENTION N/A 04/30/2021   Procedure: CORONARY STENT INTERVENTION;  Surgeon: Darron Deatrice LABOR, MD;  Location: [facility];  Service: Cardiovascular;  Proximal RCA 99% => DES PCI with Onyx Frontier 2.75 mm x 30 mm   DENTAL SURGERY  09/2016   EYE SURGERY  03/2019   eye lid surgery 11/2021   LEFT HEART CATH AND CORONARY ANGIOGRAPHY Left 04/30/2021   Procedure: LEFT HEART CATH AND CORONARY ANGIOGRAPHY;  Surgeon: Darron Deatrice LABOR, MD;  Location: [facility];  Service: Cardiovascular;; Proximal RCA 99% => DES PCI;  Ony; mild diffuse LAD and minimal diffuse LCx disease.  Moderately elevated EDP (22 mmHg).  EF 55-65 %.   POLYPECTOMY     TRANSTHORACIC ECHOCARDIOGRAM  11/04/2019   a) EF 55 to 60%.  Normal LV size and function.  GR 1 DD.  Unable to assess.  Mild LA dilation.  Normal aortic and mitral valve.  Normal RVP.  (Normal study);; b) 04/2021: Normal LV size and function.  EF 60 to 65%.  No RWMA.  GR 1 DD.Normal RV size and function.  Normal aortic and mitral valves.   TUBAL LIGATION  1980    Family History  Problem Relation Age of Onset   Myasthenia gravis Mother    Cervical cancer Mother    Cancer Mother    Depression Mother    Hypertension Mother    Obesity Mother    Stroke Mother    Heart disease Father    Coronary artery disease Father    Valvular heart disease Father    Heart attack Father 65   Arthritis Father    Hearing loss Father    Hypertension Father    Cancer Sister 7       colon   Colon cancer Sister 35   Heart Problems Sister    Heart Problems Brother    Heart attack Paternal Grandfather        81's   Cancer Brother    COPD Brother    Heart disease Brother    Breast cancer Neg Hx    Esophageal cancer Neg Hx    Colon polyps Neg Hx    Rectal cancer Neg Hx     Stomach cancer Neg Hx     Allergies  Allergen Reactions   Colchicine  Other (See Comments)    C/I with carvedilol    Hydrochlorothiazide  Other (See Comments)    Severe hyponatremia    Nifedipine     REACTION: swollen feet       Latest Ref Rng & Units 04/14/2023    7:31 AM 10/28/2022    1:41 AM 10/08/2022    4:28 PM  CBC  WBC 4.0 - 10.5 K/uL 7.1  10.1  8.9   Hemoglobin 12.0 - 15.0 g/dL 86.5  88.5  87.9   Hematocrit 36.0 - 46.0 % 38.5  33.6  35.6   Platelets 150 - 400 K/uL 188  192  223.0       CMP     Component Value Date/Time   NA 131 (L) 01/26/2024 1237   NA 134 04/11/2021 0913   K 4.0 01/26/2024 1237   CL 97 01/26/2024 1237   CO2 27 01/26/2024 1237   GLUCOSE 99 01/26/2024 1237   BUN 10 01/26/2024 1237   BUN 11 04/11/2021 0913   CREATININE 0.83 01/26/2024 1237   CREATININE 0.95 (H) 09/08/2020 1455   CALCIUM  9.4 01/26/2024 1237   PROT 7.3 01/26/2024 1237   ALBUMIN 3.8 01/26/2024 1237   AST 22 01/26/2024 1237   ALT 14 01/26/2024 1237   ALKPHOS 77 01/26/2024 1237   BILITOT 0.8 01/26/2024 1237   GFR 68.49 01/26/2024 1237   EGFR 68 04/11/2021 0913   GFRNONAA >60 04/14/2023 0731     No results found.  Assessment & Plan:   1. Renal artery stenosis (Primary) Assessment and Plan Assessment & Plan Renal artery stenosis Current renal duplex showed no significant stenosis bilaterally. Blood pressure well-controlled. No intervention required. - Continue current management and monitoring. - Scheduled follow-up in one year with repeat renal duplex.    2. Essential hypertension Continue antihypertensive medications as already ordered, these medications have been reviewed and there are no changes at this time.  3. Hyperlipidemia associated with type 2 diabetes mellitus (HCC) Continue statin as ordered and reviewed, no changes at this time   Current Outpatient Medications on File Prior to Visit  Medication Sig Dispense Refill   Albuterol -Budesonide (AIRSUPRA )  90-80 MCG/ACT AERO Inhale 1 puff into the lungs every 6 (six) hours as needed. 5.9 g 11   allopurinol  (ZYLOPRIM ) 300 MG tablet TAKE 1 TABLET BY MOUTH EVERY DAY 90 tablet 3   ALPRAZolam  (XANAX ) 0.25 MG tablet Take 0.5 tablets (0.125 mg total) by mouth 2 (two) times daily as needed for anxiety. 30 tablet 5   amLODipine  (NORVASC ) 5 MG tablet      atorvastatin  (LIPITOR) 40 MG tablet Take 1 tablet (40 mg total) by mouth daily. 90 tablet 1   azelastine  (ASTELIN ) 0.1 % nasal spray Place 2 sprays into both nostrils 2 (two) times daily. Use in each nostril as directed 30 mL 12   betamethasone  dipropionate 0.05 % cream Apply topically 2 (two) times daily. 45 g 0   betamethasone  valerate ointment (VALISONE ) 0.1 % Apply 1 Application topically 2 (two) times daily. 45 g 0   calcium  carbonate (OS-CAL) 600 MG tablet Take 600 mg by mouth daily.     carvedilol  (COREG ) 6.25 MG tablet Take 1 tablet (6.25 mg total) by mouth 2 (two) times daily. 180 tablet 1   cetirizine (ZYRTEC) 10 MG tablet Take 10 mg by mouth daily.     Cinnamon 500 MG TABS Take 500 mg by mouth daily.     cloNIDine  (CATAPRES ) 0.1 MG tablet Take 1 tablet (0.1 mg total) by mouth 2 (two) times daily. 180 tablet 3   DENTA 5000 PLUS 1.1 % CREA dental cream Take by mouth.     ELIQUIS  5 MG TABS tablet TAKE 1 TABLET BY MOUTH TWICE A DAY 180 tablet 1   fluticasone -salmeterol (ADVAIR DISKUS) 250-50 MCG/ACT AEPB TAKE 1 PUFF BY MOUTH TWICE A DAY 180 each 3   losartan  (COZAAR ) 100 MG tablet TAKE 1 TABLET BY MOUTH EVERYDAY AT BEDTIME 90 tablet 1   Lutein 20 MG TABS Take 20 mg by mouth daily.     Magnesium  250 MG TABS Take 250 mg by mouth daily.     mirtazapine  (REMERON ) 45 MG tablet TAKE 1 TABLET BY MOUTH AT BEDTIME. 90 tablet 1   mometasone  (NASONEX ) 50 MCG/ACT nasal spray Place 2 sprays into the nose daily. 17 g 3   montelukast  (SINGULAIR ) 10 MG tablet TAKE 1 TABLET BY MOUTH EVERYDAY AT BEDTIME 90 tablet 3   Multiple Vitamins-Minerals (CENTRUM MINIS WOMEN  50+) TABS Take 1 tablet by mouth daily.     Omega-3 Fatty Acids (FISH OIL) 1000 MG CAPS Take 1,000 mg by mouth daily.     OVER THE COUNTER MEDICATION Place 2 sprays into both nostrils at bedtime. Nasocort nasal spray     pantoprazole  (PROTONIX ) 40 MG tablet Take 1 tablet (40 mg total) by mouth 2 (two) times daily. For two weeks then resume once daily 104 tablet 0   Probiotic Product (PROBIOTIC PO) Take 1 tablet  by mouth daily.     tirzepatide  (MOUNJARO ) 7.5 MG/0.5ML Pen Inject 7.5 mg into the skin once a week. 3 mL 2   traZODone  (DESYREL ) 50 MG tablet Take 2 tablets (100 mg total) by mouth at bedtime as needed. for sleep 180 tablet 1   triamcinolone  lotion (KENALOG ) 0.1 % Apply 1 Application topically 2 (two) times daily as needed. 60 mL 2   Turmeric (QC TUMERIC COMPLEX PO) Take 1 capsule by mouth daily.     furosemide  (LASIX ) 20 MG tablet Take 1 tablet by mouth every other day (Patient not taking: Reported on 03/26/2024) 45 tablet 2   No current facility-administered medications on file prior to visit.    There are no Patient Instructions on file for this visit. No follow-ups on file.   Raygan Skarda E Caridad Silveira, NP

## 2024-04-28 ENCOUNTER — Other Ambulatory Visit: Payer: Self-pay | Admitting: Internal Medicine

## 2024-04-30 ENCOUNTER — Other Ambulatory Visit: Payer: Self-pay | Admitting: Internal Medicine

## 2024-05-11 ENCOUNTER — Other Ambulatory Visit: Payer: Self-pay | Admitting: Internal Medicine

## 2024-05-11 DIAGNOSIS — N63 Unspecified lump in unspecified breast: Secondary | ICD-10-CM

## 2024-05-12 ENCOUNTER — Other Ambulatory Visit: Payer: Self-pay | Admitting: Internal Medicine

## 2024-05-12 NOTE — Telephone Encounter (Signed)
 Lvm for pt to return call. Need to know if pt is needing medication refilled, if so any new areas of concern. Okay to relay

## 2024-05-12 NOTE — Telephone Encounter (Signed)
 Copied from CRM [id number]. Topic: Clinical - Medication Question >> May 12, 2024 11:12 AM Drema MATSU wrote: Reason for CRM: Patient is returning a call from Naveen Clardy. She states that she has rash on hands on lower back and she wanted to refill it before the year is out. She said that it is is a problem refilling it then that's fine. >> May 12, 2024 11:19 AM Drema MATSU wrote: Patient was told by Pharmacy that she had 1 refill on it. betamethasone  dipropionate 0.05 % cream

## 2024-05-19 ENCOUNTER — Other Ambulatory Visit: Payer: Self-pay | Admitting: Internal Medicine

## 2024-05-28 ENCOUNTER — Ambulatory Visit
Admission: RE | Admit: 2024-05-28 | Discharge: 2024-05-28 | Disposition: A | Discharge status: Home / Self Care | Source: Ambulatory Visit | Attending: Internal Medicine | Admitting: Internal Medicine

## 2024-05-28 DIAGNOSIS — N6321 Unspecified lump in the left breast, upper outer quadrant: Secondary | ICD-10-CM | POA: Diagnosis present

## 2024-05-28 DIAGNOSIS — N63 Unspecified lump in unspecified breast: Secondary | ICD-10-CM | POA: Diagnosis present

## 2024-05-28 DIAGNOSIS — D242 Benign neoplasm of left breast: Secondary | ICD-10-CM | POA: Insufficient documentation

## 2024-06-06 ENCOUNTER — Other Ambulatory Visit: Payer: Self-pay | Admitting: Internal Medicine

## 2024-06-16 ENCOUNTER — Ambulatory Visit: Admitting: Nurse Practitioner

## 2024-06-16 ENCOUNTER — Encounter: Payer: Self-pay | Admitting: Nurse Practitioner

## 2024-06-16 VITALS — BP 138/60 | HR 70 | Ht 64.0 in | Wt 190.0 lb

## 2024-06-16 DIAGNOSIS — I480 Paroxysmal atrial fibrillation: Secondary | ICD-10-CM

## 2024-06-16 DIAGNOSIS — I10 Essential (primary) hypertension: Secondary | ICD-10-CM | POA: Diagnosis not present

## 2024-06-16 DIAGNOSIS — E785 Hyperlipidemia, unspecified: Secondary | ICD-10-CM | POA: Diagnosis not present

## 2024-06-16 DIAGNOSIS — E119 Type 2 diabetes mellitus without complications: Secondary | ICD-10-CM

## 2024-06-16 DIAGNOSIS — I2510 Atherosclerotic heart disease of native coronary artery without angina pectoris: Secondary | ICD-10-CM

## 2024-06-16 DIAGNOSIS — G4730 Sleep apnea, unspecified: Secondary | ICD-10-CM

## 2024-06-16 NOTE — Patient Instructions (Signed)
 Medication Instructions:  No changes *If you need a refill on your cardiac medications before your next appointment, please call your pharmacy*  Lab Work: None ordered If you have labs (blood work) drawn today and your tests are completely normal, you will receive your results only by: MyChart Message (if you have MyChart) OR A paper copy in the mail If you have any lab test that is abnormal or we need to change your treatment, we will call you to review the results.  Testing/Procedures: None ordered  Follow-Up: At [facility], you and your health needs are our priority.  As part of our continuing mission to provide you with exceptional heart care, our providers are all part of one team.  This team includes your primary Cardiologist (physician) and Advanced Practice Providers or APPs (Physician Assistants and Nurse Practitioners) who all work together to provide you with the care you need, when you need it.  Your next appointment:   6 month(s)  Provider:   Constancia Delton, MD    We recommend signing up for the patient portal called "MyChart".  Sign up information is provided on this After Visit Summary.  MyChart is used to connect with patients for Virtual Visits (Telemedicine).  Patients are able to view lab/test results, encounter notes, upcoming appointments, etc.  Non-urgent messages can be sent to your provider as well.   To learn more about what you can do with MyChart, go to [email or web address].

## 2024-06-16 NOTE — Progress Notes (Signed)
 "    Office Visit    Patient Name: Kari [name] Date of Encounter: 06/16/2024  Primary Care Provider:  Marylynn Verneita CROME, MD Primary Cardiologist:  Kari Cave, MD  Cardiology APP:  Kari Lonni Ingle, NP   Chief Complaint    79 y.o. female with history of coronary artery disease, type 2 diabetes mellitus, diabetic neuropathy, obesity, hyperlipidemia, vitamin D  deficiency, COPD, asthma, GERD, venous stasis, lymphedema, gouty arthritis, osteoarthritis, and paroxysmal atrial fibrillation, who presents for f/u related to CAD and PAF.   Past Medical History   Subjective   Past Medical History:  Diagnosis Date   Allergy 1970   to cat, dogs, pollen, dust, cigarette smoke   Anxiety 2010   Arthritis 1995   feet   Cataract 2020   removed both eyes   COPD (chronic obstructive pulmonary disease) with chronic bronchitis (HCC)    And moderate persistent asthma; complicated by Restrictive Lung Disease from obesity   Coronary artery disease involving native coronary artery of native heart with other form of angina pectoris    a. 04/2021 Cor CTA: Cor Ca2+ 389. Domninant RCA-RPDA<PLA. RCA >70p (abnl CTFFR), LAD 50-69p, LCX Min dz; b. 04/2021 Cath/PCI: LM nl, LAD mild diff dzs, D1/2/3 nl, LCX min irregs, OM1/2/3 nl, RCA 99p (2.75x30 Onyx Frontier DES).   Depression 1993   Diastolic dysfunction    a. 04/2021 Echo: EF 60-65%, GrI DD. Nl RV fxn. Mildly dil LA.   Esophageal dysmotility 04/30/2022   Barium swallow 04/30/2022-also has impingement of tablet at aortic arch, we know from CT angiography that she has mild aortic enlargement and pulmonary outflow tract enlargement   GERD (gastroesophageal reflux disease) 1990   Hx of adenomatous colonic polyps 05/30/2010   Hyperlipidemia    Hypertension    Lymphedema of both lower extremities    No superficial venous reflux noted.  However right-sided saphenofemoral junction reflux noted and left common femoral vein reflux noted.  No DVT or  superficial thrombus.   Menopause    Migraines    Miscarriage    X 1   Moderate persistent asthma    Obesity (BMI 30.0-34.9) 03/04/2013   PAF (paroxysmal atrial fibrillation) (HCC)    a. CHA2DS2VASc = 5-->eliquis .   PONV (postoperative nausea and vomiting)    Past Surgical History:  Procedure Laterality Date   BREAST BIOPSY Left 10/07/2023   US  LT BREAST BX W LOC DEV 1ST LESION IMG BX SPEC US  GUIDE 10/07/2023 [facility]   CATARACT EXTRACTION, BILATERAL     CESAREAN SECTION  1976   X 2   COLONOSCOPY     CORONARY STENT INTERVENTION N/A 04/30/2021   Procedure: CORONARY STENT INTERVENTION;  Surgeon: Kari Deatrice LABOR, MD;  Location: [facility];  Service: Cardiovascular;  Proximal RCA 99% => DES PCI with Onyx Frontier 2.75 mm x 30 mm   DENTAL SURGERY  09/2016   EYE SURGERY  03/2019   eye lid surgery 11/2021   LEFT HEART CATH AND CORONARY ANGIOGRAPHY Left 04/30/2021   Procedure: LEFT HEART CATH AND CORONARY ANGIOGRAPHY;  Surgeon: Kari Deatrice LABOR, MD;  Location: [facility];  Service: Cardiovascular;; Proximal RCA 99% => DES PCI;  Ony; mild diffuse LAD and minimal diffuse LCx disease.  Moderately elevated EDP (22 mmHg).  EF 55-65 %.   POLYPECTOMY     TRANSTHORACIC ECHOCARDIOGRAM  11/04/2019   a) EF 55 to 60%.  Normal LV size and function.  GR 1 DD.  Unable to assess.  Mild  LA dilation.  Normal aortic and mitral valve.  Normal RVP.  (Normal study);; b) 04/2021: Normal LV size and function.  EF 60 to 65%.  No RWMA.  GR 1 DD.Normal RV size and function.  Normal aortic and mitral valves.   TUBAL LIGATION  1980    Allergies  Allergies[1]     History of Present Illness      80 y.o. y/o female with the above past medical history including CAD, paroxysmal atrial fibrillation, type 2 diabetes mellitus, diabetic neuropathy, obesity, hyperlipidemia, vitamin D  deficiency, COPD, asthma, GERD, venous stasis, lymphedema, gouty arthritis, and osteoporosis.  In late 2022, she  was seen in the [facility] ED with palpitations and found to be in atrial fibrillation with rapid ventricular response which responded to IV diltiazem  and she was subsequently placed on oral beta-blocker and Eliquis .  At follow-up, she reported chest pain and dyspnea that occurred during her episode of A-fib, and she underwent coronary CT angiogram in December 2022 revealing severe proximal RCA disease.  She subsequently underwent diagnostic catheterization revealing 99% stenosis in the proximal RCA with otherwise minimal, nonobstructive CAD.  The RCA was successfully treated with a 2.75 x 30 mm Onyx frontier drug-eluting stent.  Postprocedure was complicated by right radial hematoma, which was managed with compression.  Follow-up echocardiogram in late December 2022, showed EF of 60 to 65% with grade 1 diastolic dysfunction.    In the setting of hypertension, she was placed on clonidine  therapy and February 2025, with improvement in blood pressure at April 2025 appointment.     Ms. Kari [name] was last seen in cardiology clinic in July 2025.  Since then, she has done relatively well.  She has not been as active as she might like to be in the setting of poor weather recently.  She occasionally notes a brief palpitation lasting few seconds but a few weeks ago, she had about 10 to 15 minutes of palpitations while shopping at [employer].  With the exception of noting the irregularity, she was otherwise asymptomatic.  She returned home to take her evening dose of carvedilol  but prior to taking anything, symptoms abated.  She has had no recurrence of prolonged palpitations.  She denies chest pain, dyspnea, PND, orthopnea, dizziness, syncope, edema, or early satiety. Objective   Home Medications    Current Outpatient Medications  Medication Sig Dispense Refill   Albuterol -Budesonide (AIRSUPRA ) 90-80 MCG/ACT AERO Inhale 1 puff into the lungs every 6 (six) hours as needed. 5.9 g 11   allopurinol  (ZYLOPRIM ) 300 MG tablet  TAKE 1 TABLET BY MOUTH EVERY DAY 90 tablet 3   ALPRAZolam  (XANAX ) 0.25 MG tablet Take 0.5 tablets (0.125 mg total) by mouth 2 (two) times daily as needed for anxiety. 30 tablet 5   amLODipine  (NORVASC ) 5 MG tablet      atorvastatin  (LIPITOR) 40 MG tablet Take 1 tablet (40 mg total) by mouth daily. 90 tablet 1   Azelastine  HCl 137 MCG/SPRAY SOLN PLACE 2 SPRAYS INTO BOTH NOSTRILS 2 (TWO) TIMES DAILY. USE IN EACH NOSTRIL AS DIRECTED 90 mL 1   betamethasone  dipropionate 0.05 % cream APPLY TOPICALLY TWICE A DAY 45 g 0   calcium  carbonate (OS-CAL) 600 MG tablet Take 600 mg by mouth daily.     carvedilol  (COREG ) 6.25 MG tablet Take 1 tablet (6.25 mg total) by mouth 2 (two) times daily. 180 tablet 1   cetirizine (ZYRTEC) 10 MG tablet Take 10 mg by mouth daily.     Cinnamon 500 MG  TABS Take 500 mg by mouth daily.     cloNIDine  (CATAPRES ) 0.1 MG tablet Take 1 tablet (0.1 mg total) by mouth 2 (two) times daily. 180 tablet 3   DENTA 5000 PLUS 1.1 % CREA dental cream Take by mouth.     ELIQUIS  5 MG TABS tablet TAKE 1 TABLET BY MOUTH TWICE A DAY 180 tablet 1   fluticasone -salmeterol (ADVAIR DISKUS) 250-50 MCG/ACT AEPB TAKE 1 PUFF BY MOUTH TWICE A DAY 180 each 3   furosemide  (LASIX ) 20 MG tablet Take 1 tablet by mouth every other day 45 tablet 2   losartan  (COZAAR ) 100 MG tablet TAKE 1 TABLET BY MOUTH EVERYDAY AT BEDTIME 90 tablet 1   Lutein 20 MG TABS Take 20 mg by mouth daily.     Magnesium  250 MG TABS Take 250 mg by mouth daily.     mirtazapine  (REMERON ) 45 MG tablet TAKE 1 TABLET BY MOUTH AT BEDTIME. 90 tablet 1   mometasone  (NASONEX ) 50 MCG/ACT nasal spray Place 2 sprays into the nose daily. 17 g 3   montelukast  (SINGULAIR ) 10 MG tablet TAKE 1 TABLET BY MOUTH EVERYDAY AT BEDTIME 90 tablet 3   Multiple Vitamins-Minerals (CENTRUM MINIS WOMEN 50+) TABS Take 1 tablet by mouth daily.     Omega-3 Fatty Acids (FISH OIL) 1000 MG CAPS Take 1,000 mg by mouth daily.     OVER THE COUNTER MEDICATION Place 2 sprays  into both nostrils at bedtime. Nasocort nasal spray     pantoprazole  (PROTONIX ) 40 MG tablet TAKE 1 TABLET (40 MG TOTAL) BY MOUTH 2 (TWO) TIMES DAILY. FOR TWO WEEKS THEN RESUME ONCE DAILY 180 tablet 3   Probiotic Product (PROBIOTIC PO) Take 1 tablet by mouth daily.     tirzepatide  (MOUNJARO ) 7.5 MG/0.5ML Pen INJECT 7.5 MG SUBCUTANEOUSLY WEEKLY 2 mL 2   traZODone  (DESYREL ) 50 MG tablet Take 2 tablets (100 mg total) by mouth at bedtime as needed. for sleep 180 tablet 1   triamcinolone  lotion (KENALOG ) 0.1 % Apply 1 Application topically 2 (two) times daily as needed. 60 mL 2   Turmeric (QC TUMERIC COMPLEX PO) Take 1 capsule by mouth daily.     No current facility-administered medications for this visit.     Physical Exam    VS:  BP 138/60 (BP Location: Left Arm, Patient Position: Sitting, Cuff Size: Large)   Pulse 70   Ht 5' 4 (1.626 m)   Wt 190 lb (86.2 kg)   SpO2 98%   BMI 32.61 kg/m  , BMI Body mass index is 32.61 kg/m.    STOP-Bang Score:  4        GEN: Well nourished, well developed, in no acute distress. HEENT: normal. Neck: Supple, no JVD, carotid bruits, or masses. Cardiac: RRR, no murmurs, rubs, or gallops. No clubbing, cyanosis, edema.  Radials 2+/PT 2+ and equal bilaterally.  Respiratory:  Respirations regular and unlabored, clear to auscultation bilaterally. GI: Soft, nontender, nondistended, BS + x 4. MS: no deformity or atrophy. Skin: warm and dry, no rash. Neuro:  Strength and sensation are intact. Psych: Normal affect.  Accessory Clinical Findings    ECG personally reviewed by me today - EKG Interpretation Date/Time:  Wednesday June 16 2024 10:48:21 EST Ventricular Rate:  70 PR Interval:  228 QRS Duration:  74 QT Interval:  418 QTC Calculation: 451 R Axis:   -17  Text Interpretation: Sinus rhythm with 1st degree A-V block Cannot rule out Anterior infarct (cited on or before 19-Nov-2023) Confirmed  by Kari Bruckner [phone]) on 06/16/2024 10:53:42 AM  -  no acute changes.  Lab Results  Component Value Date   WBC 7.1 04/14/2023   HGB 13.4 04/14/2023   HCT 38.5 04/14/2023   MCV 91.4 04/14/2023   PLT 188 04/14/2023   Lab Results  Component Value Date   CREATININE 0.83 01/26/2024   BUN 10 01/26/2024   NA 131 (L) 01/26/2024   K 4.0 01/26/2024   CL 97 01/26/2024   CO2 27 01/26/2024   Lab Results  Component Value Date   ALT 14 01/26/2024   AST 22 01/26/2024   ALKPHOS 77 01/26/2024   BILITOT 0.8 01/26/2024   Lab Results  Component Value Date   CHOL 110 01/26/2024   HDL 39.30 01/26/2024   LDLCALC 46 01/26/2024   LDLDIRECT 53.0 01/26/2024   TRIG 123.0 01/26/2024   CHOLHDL 3 01/26/2024    Lab Results  Component Value Date   HGBA1C 5.4 01/26/2024   Lab Results  Component Value Date   TSH 1.57 10/08/2022       Assessment & Plan    1.  Coronary artery disease: Status post drug-eluting stent placement to the right coronary artery July 2022.  She has been doing well without chest pain or dyspnea and remains on beta-blocker, statin, ARB, and calcium  channel blocker therapy.  No aspirin  in the setting of Eliquis .  2.  Primary hypertension: Blood pressure 138/60 today, which she notes is on the higher end for her.  Since starting clonidine , she has had much better BP control at home.  We agreed that she'd start checking her BP more frequently at home over the next few wks to re-establish a trend and she will notify if she continues to trend on the higher side.  In addition to clonidine , she will continue beta-blocker, calcium  channel blocker, and ARB.  3.  Paroxysmal atrial fibrillation/palpitations: A-fib was initially diagnosed in 2022.  For the most part, she does well though she did have an episode a few weeks ago where she noted irregularity in her heart rhythm for about 10 to 15 minutes, which resolved spontaneously.  With the exception of noting irregularity, she was not otherwise symptomatic.  No recurrence.  She remains on  carvedilol  and Eliquis  therapy.  She will continue to monitor.  4.  Hyperlipidemia: LDL of 46 in September 2025 with normal LFTs.  He remains on statin therapy.  5.  Type 2 diabetes mellitus: A1c of 5.4 in September.  On Mounjaro .  6.  Sleep disordered breathing: STOP-BANG of 4.  Denies fatigue today.  Previously not interested in sleep study.  7.  Disposition: Follow-up in 6 months or sooner if necessary.  Bruckner Vivienne, NP 06/16/2024, 12:20 PM     [1]  Allergies Allergen Reactions   Colchicine  Other (See Comments)    C/I with carvedilol    Hydrochlorothiazide  Other (See Comments)    Severe hyponatremia    Nifedipine     REACTION: swollen feet   "

## 2024-07-27 ENCOUNTER — Ambulatory Visit: Admitting: Internal Medicine

## 2024-08-06 ENCOUNTER — Ambulatory Visit: Admitting: Internal Medicine

## 2024-09-28 ENCOUNTER — Ambulatory Visit

## 2025-03-24 ENCOUNTER — Ambulatory Visit (INDEPENDENT_AMBULATORY_CARE_PROVIDER_SITE_OTHER): Admitting: Vascular Surgery

## 2025-03-24 ENCOUNTER — Encounter (INDEPENDENT_AMBULATORY_CARE_PROVIDER_SITE_OTHER)
